# Patient Record
Sex: Female | Born: 1935 | Race: White | Hispanic: No | Smoking: Never smoker
Health system: Southern US, Community
[De-identification: ages and names within clinical notes are randomized; demographics above are authoritative.]

## PROBLEM LIST (undated history)

## (undated) DIAGNOSIS — G4730 Sleep apnea, unspecified: Secondary | ICD-10-CM

## (undated) DIAGNOSIS — I509 Heart failure, unspecified: Secondary | ICD-10-CM

## (undated) DIAGNOSIS — M109 Gout, unspecified: Secondary | ICD-10-CM

## (undated) DIAGNOSIS — Z8719 Personal history of other diseases of the digestive system: Secondary | ICD-10-CM

## (undated) DIAGNOSIS — I10 Essential (primary) hypertension: Secondary | ICD-10-CM

## (undated) DIAGNOSIS — K219 Gastro-esophageal reflux disease without esophagitis: Secondary | ICD-10-CM

## (undated) DIAGNOSIS — J45909 Unspecified asthma, uncomplicated: Secondary | ICD-10-CM

## (undated) DIAGNOSIS — I499 Cardiac arrhythmia, unspecified: Secondary | ICD-10-CM

## (undated) DIAGNOSIS — R519 Headache, unspecified: Secondary | ICD-10-CM

## (undated) DIAGNOSIS — M1990 Unspecified osteoarthritis, unspecified site: Secondary | ICD-10-CM

## (undated) DIAGNOSIS — IMO0001 Reserved for inherently not codable concepts without codable children: Secondary | ICD-10-CM

## (undated) DIAGNOSIS — R51 Headache: Secondary | ICD-10-CM

## (undated) DIAGNOSIS — E039 Hypothyroidism, unspecified: Secondary | ICD-10-CM

## (undated) DIAGNOSIS — R600 Localized edema: Secondary | ICD-10-CM

## (undated) HISTORY — PX: EYE SURGERY: SHX253

## (undated) HISTORY — PX: APPENDECTOMY: SHX54

## (undated) HISTORY — PX: CHOLECYSTECTOMY: SHX55

## (undated) HISTORY — DX: Heart failure, unspecified: I50.9

## (undated) HISTORY — PX: CARDIAC CATHETERIZATION: SHX172

## (undated) HISTORY — PX: JOINT REPLACEMENT: SHX530

## (undated) HISTORY — PX: BACK SURGERY: SHX140

---

## 2003-11-20 ENCOUNTER — Inpatient Hospital Stay (HOSPITAL_COMMUNITY): Admission: RE | Admit: 2003-11-20 | Discharge: 2003-11-22 | Payer: Self-pay | Admitting: Neurosurgery

## 2005-09-01 ENCOUNTER — Ambulatory Visit: Payer: Self-pay | Admitting: Internal Medicine

## 2006-09-06 ENCOUNTER — Ambulatory Visit: Payer: Self-pay | Admitting: Internal Medicine

## 2006-11-22 DIAGNOSIS — E782 Mixed hyperlipidemia: Secondary | ICD-10-CM | POA: Insufficient documentation

## 2007-01-01 ENCOUNTER — Ambulatory Visit: Payer: Self-pay | Admitting: Internal Medicine

## 2007-12-12 ENCOUNTER — Ambulatory Visit: Payer: Self-pay | Admitting: Internal Medicine

## 2008-04-21 ENCOUNTER — Ambulatory Visit: Payer: Self-pay | Admitting: General Practice

## 2008-04-21 ENCOUNTER — Other Ambulatory Visit: Payer: Self-pay

## 2008-04-29 ENCOUNTER — Inpatient Hospital Stay: Payer: Self-pay | Admitting: General Practice

## 2008-07-03 ENCOUNTER — Emergency Department: Payer: Self-pay | Admitting: Emergency Medicine

## 2008-12-16 ENCOUNTER — Ambulatory Visit: Payer: Self-pay | Admitting: Internal Medicine

## 2009-12-18 ENCOUNTER — Ambulatory Visit: Payer: Self-pay | Admitting: Internal Medicine

## 2010-01-25 ENCOUNTER — Ambulatory Visit: Payer: Self-pay | Admitting: Gastroenterology

## 2010-04-19 ENCOUNTER — Ambulatory Visit: Payer: Self-pay | Admitting: Rheumatology

## 2011-03-25 ENCOUNTER — Ambulatory Visit: Payer: Self-pay | Admitting: Internal Medicine

## 2012-04-11 ENCOUNTER — Ambulatory Visit: Payer: Self-pay | Admitting: Internal Medicine

## 2012-09-18 ENCOUNTER — Ambulatory Visit: Payer: Self-pay | Admitting: Internal Medicine

## 2012-11-07 ENCOUNTER — Ambulatory Visit: Payer: Self-pay | Admitting: Gastroenterology

## 2013-04-12 ENCOUNTER — Ambulatory Visit: Payer: Self-pay | Admitting: Internal Medicine

## 2014-04-16 ENCOUNTER — Ambulatory Visit: Payer: Self-pay | Admitting: Internal Medicine

## 2014-07-07 ENCOUNTER — Ambulatory Visit: Payer: Self-pay | Admitting: Ophthalmology

## 2014-07-07 DIAGNOSIS — I10 Essential (primary) hypertension: Secondary | ICD-10-CM

## 2014-07-07 LAB — POTASSIUM: Potassium: 3.9 mmol/L (ref 3.5–5.1)

## 2014-07-09 ENCOUNTER — Ambulatory Visit: Payer: Self-pay | Admitting: Ophthalmology

## 2015-02-14 NOTE — Op Note (Signed)
PATIENT NAME:  Andrea Phillips, Andrea Phillips MR#:  [id number] DATE OF BIRTH:  1935/02/28  DATE OF PROCEDURE:  07/09/2014  ATTENDING SURGEON:  Shanda Bumps D. Duke Salvia, MD  PREOPERATIVE DIAGNOSIS:  Macular hole, left eye.  POSTOPERATIVE DIAGNOSIS:  Macular hole, left eye.   ESTIMATED BLOOD LOSS:  Minimal.   PROCEDURE PERFORMED:  Vitrectomy with internal limiting membrane peel, air-fluid exchange, 24% SF6, left eye.   PROCEDURE NOTE:  Ms. Arcos was seen in our clinic for a macular hole, which had worsened over the past month since our last visit with her. After the risks, benefits, alternatives, and complications were reviewed, the patient elected to proceed with vitrectomy surgery to try to repair the macular hole. The patient was greeted in the preoperative holding area on the day of surgery. The left eye was identified and marked. The patient was then brought into the operating room in supine position and placed under general anesthesia. The left eye was then prepped and draped in the usual sterile fashion. A 25-gauge vitrectomy was then performed. The 3 trocars were inserted. The infusion tip was checked for placement in the vitreous cavity before starting the infusion. A core vitrectomy was then performed with the assistance of triamcinolone, and PVD was induced. ICG was then applied to the macular surface and then aspirated off, and an ILM forceps was then used to peel the ILM proximally in a 2 disc diameter radius around the macular hole. The periphery was then inspected 360 degrees with scleral depression. There were no tears or breaks found. A complete air-fluid exchange was performed, and 24% SF6 was then exchanged 4 times to decrease volume. The trocars were then removed. Leaking trocars were sutured with 7-0 Vicryl. Subconjunctival cefuroxime and dexamethasone was injected as well as a peribulbar block for the patient's comfort. The patient's eye was then patched and shield with Neo-Poly-Dex ointment,  and the patient was brought out of general anesthesia and taken to the recovery room in stable condition. The patient will see me tomorrow for followup.      ____________________________ Princella Pellegrini. Duke Salvia, MD jdr:nb D: 07/09/2014 21:50:56 ET T: 07/09/2014 22:46:23 ET JOB#: [id number]  cc: Shanda Bumps D. Duke Salvia, MD, <Dictator> Arora Coakley D [facility] MD ELECTRONICALLY SIGNED 07/23/2014 11:08

## 2015-03-02 ENCOUNTER — Encounter
Admission: RE | Admit: 2015-03-02 | Discharge: 2015-03-02 | Disposition: A | Discharge status: Home / Self Care | Payer: Medicare Other | Source: Ambulatory Visit | Attending: Ophthalmology | Admitting: Ophthalmology

## 2015-03-02 DIAGNOSIS — Z01812 Encounter for preprocedural laboratory examination: Secondary | ICD-10-CM | POA: Diagnosis not present

## 2015-03-02 DIAGNOSIS — M7989 Other specified soft tissue disorders: Secondary | ICD-10-CM | POA: Diagnosis not present

## 2015-03-02 DIAGNOSIS — I10 Essential (primary) hypertension: Secondary | ICD-10-CM | POA: Diagnosis not present

## 2015-03-02 DIAGNOSIS — Z9049 Acquired absence of other specified parts of digestive tract: Secondary | ICD-10-CM | POA: Diagnosis not present

## 2015-03-02 DIAGNOSIS — J45909 Unspecified asthma, uncomplicated: Secondary | ICD-10-CM | POA: Insufficient documentation

## 2015-03-02 DIAGNOSIS — I499 Cardiac arrhythmia, unspecified: Secondary | ICD-10-CM | POA: Insufficient documentation

## 2015-03-02 LAB — POTASSIUM: Potassium: 4.2 mmol/L (ref 3.5–5.1)

## 2015-03-05 ENCOUNTER — Encounter: Payer: Self-pay | Admitting: *Deleted

## 2015-03-05 DIAGNOSIS — Z9049 Acquired absence of other specified parts of digestive tract: Secondary | ICD-10-CM | POA: Diagnosis not present

## 2015-03-05 DIAGNOSIS — E780 Pure hypercholesterolemia: Secondary | ICD-10-CM | POA: Diagnosis not present

## 2015-03-05 DIAGNOSIS — Z888 Allergy status to other drugs, medicaments and biological substances status: Secondary | ICD-10-CM | POA: Diagnosis not present

## 2015-03-05 DIAGNOSIS — Z96652 Presence of left artificial knee joint: Secondary | ICD-10-CM | POA: Diagnosis not present

## 2015-03-05 DIAGNOSIS — M109 Gout, unspecified: Secondary | ICD-10-CM | POA: Diagnosis not present

## 2015-03-05 DIAGNOSIS — M810 Age-related osteoporosis without current pathological fracture: Secondary | ICD-10-CM | POA: Diagnosis not present

## 2015-03-05 DIAGNOSIS — E079 Disorder of thyroid, unspecified: Secondary | ICD-10-CM | POA: Diagnosis not present

## 2015-03-05 DIAGNOSIS — M069 Rheumatoid arthritis, unspecified: Secondary | ICD-10-CM | POA: Diagnosis not present

## 2015-03-05 DIAGNOSIS — M7989 Other specified soft tissue disorders: Secondary | ICD-10-CM | POA: Diagnosis not present

## 2015-03-05 DIAGNOSIS — K449 Diaphragmatic hernia without obstruction or gangrene: Secondary | ICD-10-CM | POA: Diagnosis not present

## 2015-03-05 DIAGNOSIS — Z79899 Other long term (current) drug therapy: Secondary | ICD-10-CM | POA: Diagnosis not present

## 2015-03-05 DIAGNOSIS — J45909 Unspecified asthma, uncomplicated: Secondary | ICD-10-CM | POA: Diagnosis not present

## 2015-03-05 DIAGNOSIS — K219 Gastro-esophageal reflux disease without esophagitis: Secondary | ICD-10-CM | POA: Diagnosis not present

## 2015-03-05 DIAGNOSIS — I10 Essential (primary) hypertension: Secondary | ICD-10-CM | POA: Diagnosis not present

## 2015-03-05 DIAGNOSIS — Z886 Allergy status to analgesic agent status: Secondary | ICD-10-CM | POA: Diagnosis not present

## 2015-03-05 DIAGNOSIS — H2512 Age-related nuclear cataract, left eye: Secondary | ICD-10-CM | POA: Diagnosis not present

## 2015-03-09 ENCOUNTER — Encounter: Payer: Self-pay | Admitting: *Deleted

## 2015-03-09 ENCOUNTER — Ambulatory Visit: Payer: Medicare Other | Admitting: Anesthesiology

## 2015-03-09 ENCOUNTER — Encounter
Admission: RE | Disposition: A | Discharge status: Home / Self Care | Payer: Self-pay | Source: Ambulatory Visit | Attending: Ophthalmology

## 2015-03-09 ENCOUNTER — Ambulatory Visit
Admission: RE | Admit: 2015-03-09 | Discharge: 2015-03-09 | Disposition: A | Discharge status: Home / Self Care | Payer: Medicare Other | Source: Ambulatory Visit | Attending: Ophthalmology | Admitting: Ophthalmology

## 2015-03-09 DIAGNOSIS — J45909 Unspecified asthma, uncomplicated: Secondary | ICD-10-CM | POA: Insufficient documentation

## 2015-03-09 DIAGNOSIS — H2512 Age-related nuclear cataract, left eye: Secondary | ICD-10-CM | POA: Insufficient documentation

## 2015-03-09 DIAGNOSIS — M069 Rheumatoid arthritis, unspecified: Secondary | ICD-10-CM | POA: Insufficient documentation

## 2015-03-09 DIAGNOSIS — Z888 Allergy status to other drugs, medicaments and biological substances status: Secondary | ICD-10-CM | POA: Insufficient documentation

## 2015-03-09 DIAGNOSIS — Z79899 Other long term (current) drug therapy: Secondary | ICD-10-CM | POA: Insufficient documentation

## 2015-03-09 DIAGNOSIS — E780 Pure hypercholesterolemia: Secondary | ICD-10-CM | POA: Insufficient documentation

## 2015-03-09 DIAGNOSIS — M7989 Other specified soft tissue disorders: Secondary | ICD-10-CM | POA: Insufficient documentation

## 2015-03-09 DIAGNOSIS — M810 Age-related osteoporosis without current pathological fracture: Secondary | ICD-10-CM | POA: Insufficient documentation

## 2015-03-09 DIAGNOSIS — Z886 Allergy status to analgesic agent status: Secondary | ICD-10-CM | POA: Insufficient documentation

## 2015-03-09 DIAGNOSIS — I10 Essential (primary) hypertension: Secondary | ICD-10-CM | POA: Insufficient documentation

## 2015-03-09 DIAGNOSIS — K219 Gastro-esophageal reflux disease without esophagitis: Secondary | ICD-10-CM | POA: Insufficient documentation

## 2015-03-09 DIAGNOSIS — K449 Diaphragmatic hernia without obstruction or gangrene: Secondary | ICD-10-CM | POA: Insufficient documentation

## 2015-03-09 DIAGNOSIS — Z96652 Presence of left artificial knee joint: Secondary | ICD-10-CM | POA: Insufficient documentation

## 2015-03-09 DIAGNOSIS — M109 Gout, unspecified: Secondary | ICD-10-CM | POA: Insufficient documentation

## 2015-03-09 DIAGNOSIS — E079 Disorder of thyroid, unspecified: Secondary | ICD-10-CM | POA: Insufficient documentation

## 2015-03-09 DIAGNOSIS — Z9049 Acquired absence of other specified parts of digestive tract: Secondary | ICD-10-CM | POA: Insufficient documentation

## 2015-03-09 HISTORY — DX: Reserved for inherently not codable concepts without codable children: IMO0001

## 2015-03-09 HISTORY — DX: Sleep apnea, unspecified: G47.30

## 2015-03-09 HISTORY — PX: CATARACT EXTRACTION W/PHACO: SHX586

## 2015-03-09 HISTORY — DX: Hypothyroidism, unspecified: E03.9

## 2015-03-09 HISTORY — DX: Localized edema: R60.0

## 2015-03-09 HISTORY — DX: Cardiac arrhythmia, unspecified: I49.9

## 2015-03-09 HISTORY — DX: Unspecified asthma, uncomplicated: J45.909

## 2015-03-09 HISTORY — DX: Essential (primary) hypertension: I10

## 2015-03-09 HISTORY — DX: Headache: R51

## 2015-03-09 HISTORY — DX: Gout, unspecified: M10.9

## 2015-03-09 HISTORY — DX: Headache, unspecified: R51.9

## 2015-03-09 HISTORY — DX: Personal history of other diseases of the digestive system: Z87.19

## 2015-03-09 HISTORY — DX: Gastro-esophageal reflux disease without esophagitis: K21.9

## 2015-03-09 HISTORY — DX: Unspecified osteoarthritis, unspecified site: M19.90

## 2015-03-09 SURGERY — PHACOEMULSIFICATION, CATARACT, WITH IOL INSERTION
Anesthesia: Monitor Anesthesia Care | Laterality: Left

## 2015-03-09 MED ORDER — EPINEPHRINE HCL 1 MG/ML IJ SOLN
INTRAMUSCULAR | Status: AC
  Filled 2015-03-09: qty 1

## 2015-03-09 MED ORDER — MIDAZOLAM HCL 2 MG/2ML IJ SOLN
INTRAMUSCULAR | Status: DC | PRN
  Administered 2015-03-09: 0.5 mg via INTRAVENOUS

## 2015-03-09 MED ORDER — BUPIVACAINE HCL (PF) 0.75 % IJ SOLN
INTRAMUSCULAR | Status: DC | PRN
  Administered 2015-03-09: 5 mL via OPHTHALMIC

## 2015-03-09 MED ORDER — BSS IO SOLN
INTRAOCULAR | Status: DC | PRN
  Administered 2015-03-09: 200 mL

## 2015-03-09 MED ORDER — SODIUM CHLORIDE 0.9 % IV SOLN
INTRAVENOUS | Status: DC
  Administered 2015-03-09: 09:00:00 via INTRAVENOUS

## 2015-03-09 MED ORDER — CYCLOPENTOLATE HCL 2 % OP SOLN
1.0000 [drp] | OPHTHALMIC | Status: AC
  Administered 2015-03-09 (×4): 1 [drp] via OPHTHALMIC

## 2015-03-09 MED ORDER — CYCLOPENTOLATE HCL 2 % OP SOLN
OPHTHALMIC | Status: AC
  Administered 2015-03-09: 1 [drp] via OPHTHALMIC
  Filled 2015-03-09: qty 2

## 2015-03-09 MED ORDER — CEFUROXIME OPHTHALMIC INJECTION 1 MG/0.1 ML
INJECTION | OPHTHALMIC | Status: AC
  Filled 2015-03-09: qty 0.1

## 2015-03-09 MED ORDER — TETRACAINE HCL 0.5 % OP SOLN
OPHTHALMIC | Status: DC | PRN
  Administered 2015-03-09: 1 [drp] via OPHTHALMIC

## 2015-03-09 MED ORDER — CARBACHOL 0.01 % IO SOLN
INTRAOCULAR | Status: DC | PRN
  Administered 2015-03-09: 0.5 mL via INTRAOCULAR

## 2015-03-09 MED ORDER — ALFENTANIL 500 MCG/ML IJ INJ
INJECTION | INTRAMUSCULAR | Status: DC | PRN
  Administered 2015-03-09: 500 ug via INTRAVENOUS

## 2015-03-09 MED ORDER — BUPIVACAINE HCL (PF) 0.75 % IJ SOLN
INTRAMUSCULAR | Status: AC
  Filled 2015-03-09: qty 10

## 2015-03-09 MED ORDER — LIDOCAINE HCL (PF) 4 % IJ SOLN
INTRAMUSCULAR | Status: AC
  Filled 2015-03-09: qty 5

## 2015-03-09 MED ORDER — HYALURONIDASE HUMAN 150 UNIT/ML IJ SOLN
INTRAMUSCULAR | Status: AC
  Filled 2015-03-09: qty 1

## 2015-03-09 MED ORDER — CEFUROXIME OPHTHALMIC INJECTION 1 MG/0.1 ML
INJECTION | OPHTHALMIC | Status: DC | PRN
  Administered 2015-03-09: 0.1 mL via INTRACAMERAL

## 2015-03-09 MED ORDER — MOXIFLOXACIN HCL 0.5 % OP SOLN - NO CHARGE
OPHTHALMIC | Status: DC | PRN
  Administered 2015-03-09: 1 [drp] via OPHTHALMIC

## 2015-03-09 MED ORDER — MOXIFLOXACIN HCL 0.5 % OP SOLN
OPHTHALMIC | Status: AC
  Administered 2015-03-09: 1 [drp] via OPHTHALMIC
  Filled 2015-03-09: qty 3

## 2015-03-09 MED ORDER — NA CHONDROIT SULF-NA HYALURON 40-17 MG/ML IO SOLN
INTRAOCULAR | Status: AC
  Filled 2015-03-09: qty 1

## 2015-03-09 MED ORDER — TETRACAINE HCL 0.5 % OP SOLN
OPHTHALMIC | Status: AC
  Filled 2015-03-09: qty 2

## 2015-03-09 MED ORDER — MOXIFLOXACIN HCL 0.5 % OP SOLN
1.0000 [drp] | OPHTHALMIC | Status: AC
  Administered 2015-03-09 (×3): 1 [drp] via OPHTHALMIC

## 2015-03-09 MED ORDER — PHENYLEPHRINE HCL 10 % OP SOLN
OPHTHALMIC | Status: AC
  Administered 2015-03-09: 1 [drp] via OPHTHALMIC
  Filled 2015-03-09: qty 5

## 2015-03-09 MED ORDER — PHENYLEPHRINE HCL 10 % OP SOLN
1.0000 [drp] | OPHTHALMIC | Status: AC
  Administered 2015-03-09 (×4): 1 [drp] via OPHTHALMIC

## 2015-03-09 MED ORDER — NA CHONDROIT SULF-NA HYALURON 40-17 MG/ML IO SOLN
INTRAOCULAR | Status: DC | PRN
  Administered 2015-03-09: 1 mL via INTRAOCULAR

## 2015-03-09 SURGICAL SUPPLY — 26 items
ACTIVE FMS ×3 IMPLANT
CORD BIP STRL DISP 12FT (MISCELLANEOUS) ×3 IMPLANT
DRAPE XRAY CASSETTE 23X24 (DRAPES) ×3 IMPLANT
ERASER HMR WETFIELD 18G (MISCELLANEOUS) ×3 IMPLANT
GLOVE BIO SURGEON STRL SZ8 (GLOVE) ×3 IMPLANT
GLOVE SURG LX 6.5 MICRO (GLOVE) ×2
GLOVE SURG LX 8.0 MICRO (GLOVE) ×2
GLOVE SURG LX STRL 6.5 MICRO (GLOVE) ×1 IMPLANT
GLOVE SURG LX STRL 8.0 MICRO (GLOVE) ×1 IMPLANT
GOWN STRL REUS W/ TWL LRG LVL3 (GOWN DISPOSABLE) ×1 IMPLANT
GOWN STRL REUS W/ TWL XL LVL3 (GOWN DISPOSABLE) ×1 IMPLANT
GOWN STRL REUS W/TWL LRG LVL3 (GOWN DISPOSABLE) ×2
GOWN STRL REUS W/TWL XL LVL3 (GOWN DISPOSABLE) ×2
LENS IOL ACRYSERT 25.5 (Intraocular Lens) ×3 IMPLANT
PACK CATARACT (MISCELLANEOUS) ×3 IMPLANT
PACK CATARACT DINGLEDEIN LX (MISCELLANEOUS) ×3 IMPLANT
PACK EYE AFTER SURG (MISCELLANEOUS) ×3 IMPLANT
SHLD EYE VISITEC  UNIV (MISCELLANEOUS) ×3 IMPLANT
SN6CWS25.5 ×3 IMPLANT
SOL PREP PVP 2OZ (MISCELLANEOUS) ×3
SOLUTION PREP PVP 2OZ (MISCELLANEOUS) ×1 IMPLANT
SUT SILK 5-0 (SUTURE) ×3 IMPLANT
SYR 5ML LL (SYRINGE) ×3 IMPLANT
SYR TB 1ML 27GX1/2 LL (SYRINGE) ×3 IMPLANT
WATER STERILE IRR 1000ML POUR (IV SOLUTION) ×3 IMPLANT
WIPE NON LINTING 3.25X3.25 (MISCELLANEOUS) ×3 IMPLANT

## 2015-03-09 NOTE — Progress Notes (Signed)
No pain upon discharge

## 2015-03-09 NOTE — Anesthesia Preprocedure Evaluation (Signed)
Anesthesia Evaluation  Patient identified by MRN, date of birth, ID band Patient awake    Reviewed: Allergy & Precautions, H&P , NPO status , Patient's Chart, lab work & pertinent test results, reviewed documented beta blocker date and time   Airway Mallampati: II  TM Distance: >3 FB Neck ROM: full    Dental no notable dental hx.    Pulmonary neg pulmonary ROS, shortness of breath, asthma , sleep apnea ,  breath sounds clear to auscultation  Pulmonary exam normal       Cardiovascular Exercise Tolerance: Good hypertension, negative cardio ROS  Rhythm:regular Rate:Normal     Neuro/Psych  Headaches, negative neurological ROS  negative psych ROS   GI/Hepatic negative GI ROS, Neg liver ROS, hiatal hernia, GERD-  ,  Endo/Other  negative endocrine ROSHypothyroidism   Renal/GU negative Renal ROS  negative genitourinary   Musculoskeletal   Abdominal   Peds  Hematology negative hematology ROS (+)   Anesthesia Other Findings   Reproductive/Obstetrics negative OB ROS                             Anesthesia Physical Anesthesia Plan  ASA: III  Anesthesia Plan: MAC   Post-op Pain Management:    Induction:   Airway Management Planned:   Additional Equipment:   Intra-op Plan:   Post-operative Plan:   Informed Consent: I have reviewed the patients History and Physical, chart, labs and discussed the procedure including the risks, benefits and alternatives for the proposed anesthesia with the patient or authorized representative who has indicated his/her understanding and acceptance.   Dental Advisory Given  Plan Discussed with: CRNA  Anesthesia Plan Comments:         Anesthesia Quick Evaluation

## 2015-03-09 NOTE — Transfer of Care (Signed)
Immediate Anesthesia Transfer of Care Note  Patient: Andrea Phillips  Procedure(s) Performed: Procedure(s) with comments: CATARACT EXTRACTION PHACO AND INTRAOCULAR LENS PLACEMENT (IOC) (Left) - [place] 01:31 AP% 26.1 CDE 43.72  Patient Location: PACU  Anesthesia Type:MAC  Level of Consciousness: awake, alert  and oriented  Airway & Oxygen Therapy: Patient Spontanous Breathing  Post-op Assessment: Report given to RN and Post -op Vital signs reviewed and stable  Post vital signs: Reviewed and stable  Last Vitals:  Filed Vitals:   03/09/15 0905  BP: 163/57  Pulse: 70  Temp: 36.8 C  Resp: 16    Complications: No apparent anesthesia complications

## 2015-03-09 NOTE — Discharge Instructions (Addendum)
Eye Surgery Discharge Instructions  Expect mild scratchy sensation or mild soreness. DO NOT RUB YOUR EYE!  The day of surgery:  Minimal physical activity, but bed rest is not required  No reading, computer work, or close hand work  No bending, lifting, or straining.  May watch TV  For 24 hours:  No driving, legal decisions, or alcoholic beverages  Safety precautions  Eat anything you prefer: It is better to start with liquids, then soup then solid foods.  _____ Eye patch should be worn until postoperative exam tomorrow.  ____ Solar shield eyeglasses should be worn for comfort in the sunlight/patch while sleeping  Resume all regular medications including aspirin or Coumadin if these were discontinued prior to surgery. You may shower, bathe, shave, or wash your hair. Tylenol may be taken for mild discomfort.  Call your doctor if you experience significant pain, nausea, or vomiting, fever > 101 or other signs of infection. [phone] or [phone] Specific instructions:  Follow-up Information    Follow up with Sallee Lange, MD.   Specialty:  Ophthalmology   Why:  9:10   Contact information:   [address]   [place] [address] [phone]

## 2015-03-09 NOTE — H&P (Signed)
  History and physical was faxed and scanned in.   

## 2015-03-09 NOTE — Interval H&P Note (Signed)
History and Physical Interval Note:  03/09/2015 10:37 AM  Andrea Phillips  has presented today for surgery, with the diagnosis of CATARACT  The various methods of treatment have been discussed with the patient and family. After consideration of risks, benefits and other options for treatment, the patient has consented to  Procedure(s): CATARACT EXTRACTION PHACO AND INTRAOCULAR LENS PLACEMENT (IOC) (Left) as a surgical intervention .  The patient's history has been reviewed, patient examined, no change in status, stable for surgery.  I have reviewed the patient's chart and labs.  Questions were answered to the patient's satisfaction.     [name]

## 2015-03-09 NOTE — Progress Notes (Signed)
Report given to Octaviano Glow, RN by Nicholes Rough, RN

## 2015-03-09 NOTE — Op Note (Signed)
Date of Surgery: 03/09/2015 Date of Dictation: 03/09/2015 11:16 AM Pre-operative Diagnosis:Nuclear Sclerotic Cataract, Posterior Subcapsular Cataract, Cortical Cataract and Mature Cataract left Eye Post-operative Diagnosis: same Procedure performed: Extra-capsular Cataract Extraction (ECCE) with placement of a posterior chamber intraocular lens (IOL) left Eye IOL:  Implant Name Type Inv. Item Serial No. Manufacturer Lot No. LRB No. Used  LENS IOL ACRYSERT 25.5 - [id number] Intraocular Lens LENS IOL ACRYSERT 25.5 [id number] ALCON   Left 1   Anesthesia: 2% Lidocaine and 4% Marcaine in a 50/50 mixture with 10 unites/ml of Hylenex given as a peribulbar Anesthesiologist: Anesthesiologist: Yevette Edwards, MD CRNA: Darrol Jump, CRNA Complications: none Estimated Blood Loss: less than 1 ml  Description of procedure:  The patient was given anesthesia and sedation via intravenous access. The patient was then prepped and draped in the usual fashion. A 25-gauge needle was bent for initiating the capsulorhexis. A 5-0 silk suture was placed through the conjunctiva superior and inferiorly to serve as bridle sutures. Hemostasis was obtained at the superior limbus using an eraser cautery. A partial thickness groove was made at the anterior surgical limbus with a 64 Beaver blade and this was dissected anteriorly with an [employer]O. The anterior chamber was entered at 10 o'clock with a 1.0 mm paracentesis knife and through the lamellar dissection with a 2.6 mm Alcon keratome. DiscoVisc was injected to replace the aqueous and a continuous tear curvilinear capsulorhexis was performed using a bent 25-gauge needle.  Balance salt on a syringe was used to perform hydro-dissection and phacoemulsification was carried out using a divide and conquer technique. Procedure(s) with comments: CATARACT EXTRACTION PHACO AND INTRAOCULAR LENS PLACEMENT (IOC) (Left) - [place] 01:31 AP% 26.1 CDE 43.72. Irrigation/aspiration was  used to remove the residual cortex and the capsular bag was inflated with DiscoVisc. The intraocular lens was inserted into the capsular bag using a pre-loaded Acrysert Delivery System. Irrigation/aspiration was used to remove the residual DiscoVisc. The wound was inflated with balanced salt and checked for leaks. None were found. Miostat was injected via the paracentesis track and 0.1 ml of cefuroxime containing 1 mg of drug  was injected via the paracentesis track. The wound was checked for leaks again and none were found.   The bridal sutures were removed and two drops of Vigamox were placed on the eye. An eye shield was placed to protect the eye and the patient was discharged to the recovery area in good condition.   Arnol Mcgibbon MD

## 2015-03-09 NOTE — Anesthesia Postprocedure Evaluation (Signed)
  Anesthesia Post-op Note  Patient: Andrea Phillips  Procedure(s) Performed: Procedure(s) with comments: CATARACT EXTRACTION PHACO AND INTRAOCULAR LENS PLACEMENT (IOC) (Left) - [place] 01:31 AP% 26.1 CDE 43.72  Anesthesia type:MAC  Patient location: PACU  Post pain: Pain level controlled  Post assessment: Post-op Vital signs reviewed, Patient's Cardiovascular Status Stable, Respiratory Function Stable, Patent Airway and No signs of Nausea or vomiting  Post vital signs: Reviewed and stable  Last Vitals:  Filed Vitals:   03/09/15 1119  BP: 142/66  Pulse:   Temp: 36.2 C  Resp: 16    Level of consciousness: awake, alert  and patient cooperative  Complications: No apparent anesthesia complications

## 2015-03-10 ENCOUNTER — Encounter: Payer: Self-pay | Admitting: Ophthalmology

## 2015-06-02 ENCOUNTER — Other Ambulatory Visit: Payer: Self-pay | Admitting: Internal Medicine

## 2015-06-02 DIAGNOSIS — Z1231 Encounter for screening mammogram for malignant neoplasm of breast: Secondary | ICD-10-CM

## 2015-06-03 ENCOUNTER — Ambulatory Visit
Admission: RE | Admit: 2015-06-03 | Discharge: 2015-06-03 | Disposition: A | Discharge status: Home / Self Care | Payer: Medicare Other | Source: Ambulatory Visit | Attending: Internal Medicine | Admitting: Internal Medicine

## 2015-06-03 DIAGNOSIS — Z1231 Encounter for screening mammogram for malignant neoplasm of breast: Secondary | ICD-10-CM | POA: Insufficient documentation

## 2016-03-25 ENCOUNTER — Other Ambulatory Visit: Payer: Self-pay | Admitting: Internal Medicine

## 2016-03-25 DIAGNOSIS — Z1231 Encounter for screening mammogram for malignant neoplasm of breast: Secondary | ICD-10-CM

## 2016-06-03 ENCOUNTER — Ambulatory Visit
Admission: RE | Admit: 2016-06-03 | Discharge: 2016-06-03 | Disposition: A | Discharge status: Home / Self Care | Payer: Medicare Other | Source: Ambulatory Visit | Attending: Internal Medicine | Admitting: Internal Medicine

## 2016-06-03 ENCOUNTER — Ambulatory Visit: Payer: Medicare Other

## 2016-06-03 DIAGNOSIS — Z1231 Encounter for screening mammogram for malignant neoplasm of breast: Secondary | ICD-10-CM | POA: Insufficient documentation

## 2017-04-04 ENCOUNTER — Ambulatory Visit (INDEPENDENT_AMBULATORY_CARE_PROVIDER_SITE_OTHER): Payer: Medicare Other | Admitting: Vascular Surgery

## 2017-04-04 ENCOUNTER — Encounter (INDEPENDENT_AMBULATORY_CARE_PROVIDER_SITE_OTHER): Payer: Self-pay | Admitting: Vascular Surgery

## 2017-04-04 VITALS — BP 180/80 | HR 73 | Resp 16 | Ht 60.0 in | Wt 186.0 lb

## 2017-04-04 DIAGNOSIS — I83813 Varicose veins of bilateral lower extremities with pain: Secondary | ICD-10-CM

## 2017-04-04 DIAGNOSIS — I10 Essential (primary) hypertension: Secondary | ICD-10-CM | POA: Diagnosis not present

## 2017-04-04 NOTE — Progress Notes (Signed)
Subjective:    Patient ID: Andrea Phillips, female    DOB: [birth year], 82 y.o.   MRN: [id number] Chief Complaint  Patient presents with  . New Patient (Initial Visit)   Presents as a new patient referred by Dr. Judie Grieve for evaluation of painful varicose veins. Patient endorses a history of pain located on the front of her left shin about 2 weeks ago. Patient states an intermittent left calf burning and stinging sensation. Patient denies any claudication-like symptoms, rest pain or ulcerations lower extremity. Her discomfort is intermittent and present with and without activity. The patient does not wear compression stockings however she does engage in elevation of her lower extremity. The patient does have visible varicose veins. Patient denies any history of trauma or surgery to the left lower extremity. Patient denies any fever, nausea or vomiting.    Review of Systems  Constitutional: Negative.   HENT: Negative.   Eyes: Negative.   Respiratory: Negative.   Cardiovascular:       Left lower extremity pain  Gastrointestinal: Negative.   Endocrine: Negative.   Genitourinary: Negative.   Musculoskeletal: Negative.   Skin: Negative.   Allergic/Immunologic: Negative.   Neurological: Negative.   Hematological: Negative.   Psychiatric/Behavioral: Negative.       Objective:   Physical Exam  Constitutional: She is oriented to person, place, and time. She appears well-developed and well-nourished. No distress.  HENT:  Head: Normocephalic and atraumatic.  Eyes: Conjunctivae are normal. Pupils are equal, round, and reactive to light.  Neck: Normal range of motion.  Cardiovascular: Normal rate, regular rhythm, normal heart sounds and intact distal pulses.   Pulses:      Radial pulses are 2+ on the right side, and 2+ on the left side.       Dorsalis pedis pulses are 2+ on the right side, and 2+ on the left side.       Posterior tibial pulses are 2+ on the right side, and 2+ on  the left side.  Pulmonary/Chest: Effort normal.  Musculoskeletal: Normal range of motion. She exhibits no edema.  Neurological: She is alert and oriented to person, place, and time.  Skin: She is not diaphoretic.  Patient with <1cm scattered varicose veins noted to the left lower extremity. No stasis dermatitis noted. Skin is intact.   Psychiatric: She has a normal mood and affect. Her behavior is normal. Judgment and thought content normal.  Vitals reviewed.   BP (!) 180/80   Pulse 73   Resp 16   Ht 5' (1.524 m)   Wt 186 lb (84.4 kg)   BMI 36.33 kg/m   Past Medical History:  Diagnosis Date  . Arthritis    ra  . Asthma   . Dysrhythmia   . Edema extremities   . GERD (gastroesophageal reflux disease)   . Gout   . Headache   . History of hiatal hernia   . Hypertension   . Hypothyroidism   . Shortness of breath dyspnea   . Sleep apnea     Social History   Social History  . Marital status: Widowed    Spouse name: N/A  . Number of children: N/A  . Years of education: N/A   Occupational History  . Not on file.   Social History Main Topics  . Smoking status: Never Smoker  . Smokeless tobacco: Never Used  . Alcohol use No  . Drug use: No  . Sexual activity: Not on file   Other Topics  Concern  . Not on file   Social History Narrative  . No narrative on file    Past Surgical History:  Procedure Laterality Date  . APPENDECTOMY    . BACK SURGERY    . CARDIAC CATHETERIZATION    . CATARACT EXTRACTION W/PHACO Left 03/09/2015   Procedure: CATARACT EXTRACTION PHACO AND INTRAOCULAR LENS PLACEMENT (IOC);  Surgeon: Sallee Lange, MD;  Location: [facility] ORS;  Service: Ophthalmology;  Laterality: Left;  [place] 01:31 AP% 26.1 CDE 43.72  . CHOLECYSTECTOMY    . EYE SURGERY     retina  . JOINT REPLACEMENT     left knee    Family History  Problem Relation Age of Onset  . Breast cancer Cousin     Allergies  Allergen Reactions  . Ace Inhibitors Cough  . Amlodipine  Itching  . Aspirin   . Atorvastatin Other (See Comments)  . Other     Yellow dye 6  . Prednisone   . Valsartan Itching       Assessment & Plan:  Presents as a new patient referred by Dr. Judie Grieve for evaluation of painful varicose veins. Patient endorses a history of pain located on the front of her left shin about 2 weeks ago. Patient states an intermittent left calf burning and stinging sensation. Patient denies any claudication-like symptoms, rest pain or ulcerations lower extremity. Her discomfort is intermittent and present with and without activity. The patient does not wear compression stockings however she does engage in elevation of her lower extremity. The patient does have visible varicose veins. Patient denies any history of trauma or surgery to the left lower extremity. Patient denies any fever, nausea or vomiting.   1. Varicose veins of both lower extremities with pain - New The patient was encouraged to wear graduated compression stockings (20-30 mmHg) on a daily basis. The patient was instructed to begin wearing the stockings first thing in the morning and removing them in the evening. The patient was instructed specifically not to sleep in the stockings. Prescription given. In addition, behavioral modification including elevation during the day will be initiated. Anti-inflammatories for pain. Patient elected to try conservative therapy before moving forward with a left lower extremity ultrasound of her veins Patient with strong pedal pulses bilaterally.   - VAS [place] LOWER EXTREMITY VENOUS REFLUX; Future  2. Essential hypertension - stable Encouraged good control as its slows the progression of atherosclerotic disease  Current Outpatient Prescriptions on File Prior to Visit  Medication Sig Dispense Refill  . levothyroxine (SYNTHROID, LEVOTHROID) 125 MCG tablet Take 125 mcg by mouth daily before breakfast.    . losartan-hydrochlorothiazide (HYZAAR) 100-12.5 MG per  tablet Take 1 tablet by mouth daily.    Marland Kitchen amLODipine (NORVASC) 5 MG tablet Take 5 mg by mouth daily.     No current facility-administered medications on file prior to visit.     There are no Patient Instructions on file for this visit. No Follow-up on file.   Kristof Nadeem A Glada Wickstrom, PA-C

## 2017-05-22 ENCOUNTER — Other Ambulatory Visit: Payer: Self-pay | Admitting: Internal Medicine

## 2017-05-22 DIAGNOSIS — Z1231 Encounter for screening mammogram for malignant neoplasm of breast: Secondary | ICD-10-CM

## 2017-06-06 ENCOUNTER — Ambulatory Visit
Admission: RE | Admit: 2017-06-06 | Discharge: 2017-06-06 | Disposition: A | Discharge status: Home / Self Care | Payer: Medicare Other | Source: Ambulatory Visit | Attending: Internal Medicine | Admitting: Internal Medicine

## 2017-06-06 DIAGNOSIS — Z1231 Encounter for screening mammogram for malignant neoplasm of breast: Secondary | ICD-10-CM | POA: Diagnosis present

## 2017-12-19 ENCOUNTER — Other Ambulatory Visit: Payer: Self-pay | Admitting: Specialist

## 2017-12-19 DIAGNOSIS — R918 Other nonspecific abnormal finding of lung field: Secondary | ICD-10-CM

## 2017-12-19 DIAGNOSIS — R0602 Shortness of breath: Secondary | ICD-10-CM

## 2017-12-29 ENCOUNTER — Ambulatory Visit
Admission: RE | Admit: 2017-12-29 | Discharge: 2017-12-29 | Disposition: A | Discharge status: Home / Self Care | Payer: Medicare Other | Source: Ambulatory Visit | Attending: Specialist | Admitting: Specialist

## 2017-12-29 DIAGNOSIS — K449 Diaphragmatic hernia without obstruction or gangrene: Secondary | ICD-10-CM | POA: Diagnosis not present

## 2017-12-29 DIAGNOSIS — R0602 Shortness of breath: Secondary | ICD-10-CM | POA: Insufficient documentation

## 2017-12-29 DIAGNOSIS — R918 Other nonspecific abnormal finding of lung field: Secondary | ICD-10-CM | POA: Diagnosis present

## 2017-12-29 DIAGNOSIS — I700 Atherosclerosis of aorta: Secondary | ICD-10-CM | POA: Diagnosis not present

## 2017-12-29 MED ORDER — IOPAMIDOL (ISOVUE-300) INJECTION 61%
75.0000 mL | Freq: Once | INTRAVENOUS | Status: AC | PRN
  Administered 2017-12-29: 75 mL via INTRAVENOUS

## 2018-04-11 ENCOUNTER — Other Ambulatory Visit: Payer: Self-pay | Admitting: Specialist

## 2018-04-11 DIAGNOSIS — R918 Other nonspecific abnormal finding of lung field: Secondary | ICD-10-CM

## 2018-04-11 DIAGNOSIS — R0609 Other forms of dyspnea: Principal | ICD-10-CM

## 2018-06-15 ENCOUNTER — Other Ambulatory Visit: Payer: Self-pay | Admitting: Internal Medicine

## 2018-06-15 DIAGNOSIS — Z1231 Encounter for screening mammogram for malignant neoplasm of breast: Secondary | ICD-10-CM

## 2018-07-09 ENCOUNTER — Ambulatory Visit
Admission: RE | Admit: 2018-07-09 | Discharge: 2018-07-09 | Disposition: A | Discharge status: Home / Self Care | Payer: Medicare Other | Source: Ambulatory Visit | Attending: Internal Medicine | Admitting: Internal Medicine

## 2018-07-09 DIAGNOSIS — Z1231 Encounter for screening mammogram for malignant neoplasm of breast: Secondary | ICD-10-CM | POA: Insufficient documentation

## 2019-03-07 ENCOUNTER — Ambulatory Visit
Admission: RE | Admit: 2019-03-07 | Discharge: 2019-03-07 | Disposition: A | Discharge status: Home / Self Care | Payer: Medicare Other | Source: Ambulatory Visit | Attending: Specialist | Admitting: Specialist

## 2019-03-07 ENCOUNTER — Other Ambulatory Visit: Payer: Self-pay

## 2019-03-07 DIAGNOSIS — R918 Other nonspecific abnormal finding of lung field: Secondary | ICD-10-CM | POA: Insufficient documentation

## 2019-03-07 DIAGNOSIS — R0609 Other forms of dyspnea: Secondary | ICD-10-CM | POA: Insufficient documentation

## 2019-05-14 ENCOUNTER — Other Ambulatory Visit: Payer: Self-pay | Admitting: Family

## 2019-05-14 DIAGNOSIS — Z1231 Encounter for screening mammogram for malignant neoplasm of breast: Secondary | ICD-10-CM

## 2019-07-11 ENCOUNTER — Ambulatory Visit
Admission: RE | Admit: 2019-07-11 | Discharge: 2019-07-11 | Disposition: A | Discharge status: Home / Self Care | Payer: Medicare Other | Source: Ambulatory Visit | Attending: Family | Admitting: Family

## 2019-07-11 DIAGNOSIS — Z1231 Encounter for screening mammogram for malignant neoplasm of breast: Secondary | ICD-10-CM | POA: Insufficient documentation

## 2020-04-07 ENCOUNTER — Emergency Department: Payer: Medicare Other

## 2020-04-07 ENCOUNTER — Other Ambulatory Visit: Payer: Self-pay

## 2020-04-07 ENCOUNTER — Observation Stay
Admission: EM | Admit: 2020-04-07 | Discharge: 2020-04-09 | Disposition: A | Discharge status: Home / Self Care | Payer: Medicare Other | Attending: Internal Medicine | Admitting: Internal Medicine

## 2020-04-07 DIAGNOSIS — M2578 Osteophyte, vertebrae: Secondary | ICD-10-CM | POA: Diagnosis not present

## 2020-04-07 DIAGNOSIS — R0600 Dyspnea, unspecified: Secondary | ICD-10-CM | POA: Diagnosis not present

## 2020-04-07 DIAGNOSIS — I493 Ventricular premature depolarization: Secondary | ICD-10-CM | POA: Diagnosis not present

## 2020-04-07 DIAGNOSIS — K449 Diaphragmatic hernia without obstruction or gangrene: Secondary | ICD-10-CM | POA: Insufficient documentation

## 2020-04-07 DIAGNOSIS — M47816 Spondylosis without myelopathy or radiculopathy, lumbar region: Secondary | ICD-10-CM | POA: Insufficient documentation

## 2020-04-07 DIAGNOSIS — Z886 Allergy status to analgesic agent status: Secondary | ICD-10-CM | POA: Insufficient documentation

## 2020-04-07 DIAGNOSIS — J45909 Unspecified asthma, uncomplicated: Secondary | ICD-10-CM | POA: Insufficient documentation

## 2020-04-07 DIAGNOSIS — G4733 Obstructive sleep apnea (adult) (pediatric): Secondary | ICD-10-CM | POA: Diagnosis not present

## 2020-04-07 DIAGNOSIS — Z9049 Acquired absence of other specified parts of digestive tract: Secondary | ICD-10-CM | POA: Insufficient documentation

## 2020-04-07 DIAGNOSIS — M160 Bilateral primary osteoarthritis of hip: Secondary | ICD-10-CM | POA: Diagnosis not present

## 2020-04-07 DIAGNOSIS — M25551 Pain in right hip: Secondary | ICD-10-CM

## 2020-04-07 DIAGNOSIS — Y9389 Activity, other specified: Secondary | ICD-10-CM | POA: Diagnosis not present

## 2020-04-07 DIAGNOSIS — R7989 Other specified abnormal findings of blood chemistry: Secondary | ICD-10-CM

## 2020-04-07 DIAGNOSIS — R55 Syncope and collapse: Secondary | ICD-10-CM | POA: Diagnosis not present

## 2020-04-07 DIAGNOSIS — S20211A Contusion of right front wall of thorax, initial encounter: Secondary | ICD-10-CM | POA: Diagnosis not present

## 2020-04-07 DIAGNOSIS — Z79899 Other long term (current) drug therapy: Secondary | ICD-10-CM | POA: Insufficient documentation

## 2020-04-07 DIAGNOSIS — J984 Other disorders of lung: Secondary | ICD-10-CM | POA: Insufficient documentation

## 2020-04-07 DIAGNOSIS — E785 Hyperlipidemia, unspecified: Secondary | ICD-10-CM | POA: Insufficient documentation

## 2020-04-07 DIAGNOSIS — K219 Gastro-esophageal reflux disease without esophagitis: Secondary | ICD-10-CM | POA: Insufficient documentation

## 2020-04-07 DIAGNOSIS — Z7901 Long term (current) use of anticoagulants: Secondary | ICD-10-CM | POA: Insufficient documentation

## 2020-04-07 DIAGNOSIS — M069 Rheumatoid arthritis, unspecified: Secondary | ICD-10-CM | POA: Diagnosis not present

## 2020-04-07 DIAGNOSIS — I119 Hypertensive heart disease without heart failure: Secondary | ICD-10-CM | POA: Diagnosis not present

## 2020-04-07 DIAGNOSIS — W19XXXA Unspecified fall, initial encounter: Secondary | ICD-10-CM | POA: Insufficient documentation

## 2020-04-07 DIAGNOSIS — R778 Other specified abnormalities of plasma proteins: Secondary | ICD-10-CM | POA: Insufficient documentation

## 2020-04-07 DIAGNOSIS — I10 Essential (primary) hypertension: Secondary | ICD-10-CM | POA: Diagnosis present

## 2020-04-07 DIAGNOSIS — Z803 Family history of malignant neoplasm of breast: Secondary | ICD-10-CM | POA: Diagnosis not present

## 2020-04-07 DIAGNOSIS — I700 Atherosclerosis of aorta: Secondary | ICD-10-CM | POA: Insufficient documentation

## 2020-04-07 DIAGNOSIS — Z9842 Cataract extraction status, left eye: Secondary | ICD-10-CM | POA: Diagnosis not present

## 2020-04-07 DIAGNOSIS — E039 Hypothyroidism, unspecified: Secondary | ICD-10-CM | POA: Diagnosis not present

## 2020-04-07 DIAGNOSIS — Z888 Allergy status to other drugs, medicaments and biological substances status: Secondary | ICD-10-CM | POA: Insufficient documentation

## 2020-04-07 DIAGNOSIS — Z96652 Presence of left artificial knee joint: Secondary | ICD-10-CM | POA: Insufficient documentation

## 2020-04-07 DIAGNOSIS — Z961 Presence of intraocular lens: Secondary | ICD-10-CM | POA: Diagnosis not present

## 2020-04-07 DIAGNOSIS — Z20822 Contact with and (suspected) exposure to covid-19: Secondary | ICD-10-CM | POA: Insufficient documentation

## 2020-04-07 LAB — CBC WITH DIFFERENTIAL/PLATELET
Abs Immature Granulocytes: 0.03 10*3/uL (ref 0.00–0.07)
Basophils Absolute: 0.1 10*3/uL (ref 0.0–0.1)
Basophils Relative: 1 %
Eosinophils Absolute: 0.3 10*3/uL (ref 0.0–0.5)
Eosinophils Relative: 3 %
HCT: 43.6 % (ref 36.0–46.0)
Hemoglobin: 14.6 g/dL (ref 12.0–15.0)
Immature Granulocytes: 0 %
Lymphocytes Relative: 23 %
Lymphs Abs: 2 10*3/uL (ref 0.7–4.0)
MCH: 29.4 pg (ref 26.0–34.0)
MCHC: 33.5 g/dL (ref 30.0–36.0)
MCV: 87.7 fL (ref 80.0–100.0)
Monocytes Absolute: 0.7 10*3/uL (ref 0.1–1.0)
Monocytes Relative: 8 %
Neutro Abs: 5.5 10*3/uL (ref 1.7–7.7)
Neutrophils Relative %: 65 %
Platelets: 262 10*3/uL (ref 150–400)
RBC: 4.97 MIL/uL (ref 3.87–5.11)
RDW: 13.2 % (ref 11.5–15.5)
WBC: 8.6 10*3/uL (ref 4.0–10.5)
nRBC: 0 % (ref 0.0–0.2)

## 2020-04-07 LAB — COMPREHENSIVE METABOLIC PANEL
ALT: 13 U/L (ref 0–44)
AST: 23 U/L (ref 15–41)
Albumin: 3.7 g/dL (ref 3.5–5.0)
Alkaline Phosphatase: 58 U/L (ref 38–126)
Anion gap: 9 (ref 5–15)
BUN: 15 mg/dL (ref 8–23)
CO2: 29 mmol/L (ref 22–32)
Calcium: 9.9 mg/dL (ref 8.9–10.3)
Chloride: 106 mmol/L (ref 98–111)
Creatinine, Ser: 0.85 mg/dL (ref 0.44–1.00)
GFR calc Af Amer: >60 mL/min (ref >60)
GFR calc non Af Amer: >60 mL/min (ref >60)
Glucose, Bld: 128 mg/dL — ABNORMAL HIGH (ref 70–99)
Potassium: 3.9 mmol/L (ref 3.5–5.1)
Sodium: 144 mmol/L (ref 135–145)
Total Bilirubin: 0.9 mg/dL (ref 0.3–1.2)
Total Protein: 6.6 g/dL (ref 6.5–8.1)

## 2020-04-07 LAB — URINALYSIS, COMPLETE (UACMP) WITH MICROSCOPIC
Bilirubin Urine: NEGATIVE
Glucose, UA: NEGATIVE mg/dL
Hgb urine dipstick: NEGATIVE
Ketones, ur: NEGATIVE mg/dL
Leukocytes,Ua: NEGATIVE
Nitrite: NEGATIVE
Protein, ur: NEGATIVE mg/dL
Specific Gravity, Urine: 1.002 — ABNORMAL LOW (ref 1.005–1.030)
pH: 7 (ref 5.0–8.0)

## 2020-04-07 LAB — TSH: TSH: 0.396 u[IU]/mL (ref 0.350–4.500)

## 2020-04-07 LAB — TROPONIN I (HIGH SENSITIVITY)
Troponin I (High Sensitivity): 28 ng/L — ABNORMAL HIGH (ref <18)
Troponin I (High Sensitivity): 35 ng/L — ABNORMAL HIGH (ref <18)

## 2020-04-07 MED ORDER — SODIUM CHLORIDE 0.9 % IV SOLN: INTRAVENOUS | Status: DC

## 2020-04-07 MED ORDER — ONDANSETRON HCL 4 MG/2ML IJ SOLN: 4.0000 mg | Freq: Four times a day (QID) | INTRAMUSCULAR | Status: DC | PRN

## 2020-04-07 MED ORDER — ENOXAPARIN SODIUM 40 MG/0.4ML ~~LOC~~ SOLN: 40.0000 mg | SUBCUTANEOUS | Status: DC

## 2020-04-07 MED ORDER — OXYCODONE HCL 5 MG PO TABS
2.5000 mg | ORAL_TABLET | Freq: Once | ORAL | Status: AC
  Administered 2020-04-07: 2.5 mg via ORAL
  Filled 2020-04-07: qty 1

## 2020-04-07 MED ORDER — HYDROCODONE-ACETAMINOPHEN 5-325 MG PO TABS
1.0000 | ORAL_TABLET | ORAL | Status: DC | PRN
  Administered 2020-04-08: 1 via ORAL
  Filled 2020-04-07: qty 1

## 2020-04-07 MED ORDER — IOHEXOL 300 MG/ML  SOLN
75.0000 mL | Freq: Once | INTRAMUSCULAR | Status: AC | PRN
  Administered 2020-04-07: 75 mL via INTRAVENOUS
  Filled 2020-04-07: qty 75

## 2020-04-07 MED ORDER — ONDANSETRON HCL 4 MG PO TABS: 4.0000 mg | ORAL_TABLET | Freq: Four times a day (QID) | ORAL | Status: DC | PRN

## 2020-04-07 MED ORDER — SENNOSIDES-DOCUSATE SODIUM 8.6-50 MG PO TABS: 1.0000 | ORAL_TABLET | Freq: Every evening | ORAL | Status: DC | PRN

## 2020-04-07 MED ORDER — ACETAMINOPHEN 650 MG RE SUPP: 650.0000 mg | Freq: Four times a day (QID) | RECTAL | Status: DC | PRN

## 2020-04-07 MED ORDER — SODIUM CHLORIDE 0.9% FLUSH
3.0000 mL | Freq: Two times a day (BID) | INTRAVENOUS | Status: DC
  Administered 2020-04-08 – 2020-04-09 (×2): 3 mL via INTRAVENOUS

## 2020-04-07 MED ORDER — ACETAMINOPHEN 325 MG PO TABS
650.0000 mg | ORAL_TABLET | Freq: Four times a day (QID) | ORAL | Status: DC | PRN
  Administered 2020-04-08 – 2020-04-09 (×3): 650 mg via ORAL
  Filled 2020-04-07 (×3): qty 2

## 2020-04-07 NOTE — ED Provider Notes (Signed)
[facility] Emergency Department Provider Note ____________________________________________   First MD Initiated Contact with Patient 04/07/20 1842     (approximate)  I have reviewed the triage vital signs and the nursing notes.   HISTORY  Chief Complaint Fall and Hip Pain  HPI Andrea Phillips is a 85 y.o. female presents to the emergency department for treatment and evaluation after falling in [employer].  She is unsure what caused her fall.  She states that she also felt 2 days ago and is unsure what caused that fall as well.  She is complaining of right hip pain since her fall today.  She denies dizziness, headache, chest pain, palpitations, nausea, vomiting, diarrhea.  She states that she has been here at the hospital quite frequently over the past week or so because her son was in ICU.  She states that he passed away 2 days ago.  She has felt "normal" otherwise.  She denies any changes to her medications.     Past Medical History:  Diagnosis Date   Arthritis    ra   Asthma    Dysrhythmia    Edema extremities    GERD (gastroesophageal reflux disease)    Gout    Headache    History of hiatal hernia    Hypertension    Hypothyroidism    Shortness of breath dyspnea    Sleep apnea     Patient Active Problem List   Diagnosis Date Noted   Varicose veins of both lower extremities with pain 04/04/2017   Essential hypertension 04/04/2017    Past Surgical History:  Procedure Laterality Date   APPENDECTOMY     BACK SURGERY     CARDIAC CATHETERIZATION     CATARACT EXTRACTION W/PHACO Left 03/09/2015   Procedure: CATARACT EXTRACTION PHACO AND INTRAOCULAR LENS PLACEMENT ([place]);  Surgeon: Estill Cotta, MD;  Location: [facility] ORS;  Service: Ophthalmology;  Laterality: Left;  [place] 01:31 AP% 26.1 CDE 43.72   CHOLECYSTECTOMY     EYE SURGERY     retina   JOINT REPLACEMENT     left knee    Prior to Admission medications   Medication  Sig Start Date End Date Taking? Authorizing Provider  amLODipine (NORVASC) 5 MG tablet Take 5 mg by mouth daily.    [provider]  B Complex-Biotin-FA (VITAMIN B50 COMPLEX PO) Take by mouth daily.    [provider]  Cholecalciferol (VITAMIN D3) 1000 units CAPS once a day 11/07/14   [provider]  levothyroxine (SYNTHROID, LEVOTHROID) 125 MCG tablet Take 125 mcg by mouth daily before breakfast.    [provider]  losartan-hydrochlorothiazide (HYZAAR) 100-12.5 MG per tablet Take 1 tablet by mouth daily.    [provider]  vitamin E 400 UNIT capsule once a day 01/06/15   [provider]    Allergies Ace inhibitors, Amlodipine, Aspirin, Atorvastatin, Other, Prednisone, and Valsartan  Family History  Problem Relation Age of Onset   Breast cancer Cousin     Social History Social History   Tobacco Use   Smoking status: Never Smoker   Smokeless tobacco: Never Used  Substance Use Topics   Alcohol use: No   Drug use: No    Review of Systems  Constitutional: No fever/chills Eyes: No visual changes. ENT: No sore throat. Cardiovascular: Denies chest pain. Respiratory: Denies shortness of breath. Gastrointestinal: No abdominal pain.  No nausea, no vomiting.  No diarrhea.  No constipation. Genitourinary: Negative for dysuria. Musculoskeletal: Positive for right  hip pain and right side rib pain. Skin: Negative for rash. Neurological: Negative for headaches, focal weakness or numbness. ____________________________________________   PHYSICAL EXAM:  VITAL SIGNS: ED Triage Vitals  Enc Vitals Group     BP 04/07/20 1828 (!) 191/80     Pulse Rate 04/07/20 1828 81     Resp 04/07/20 1828 18     Temp 04/07/20 1828 99.2 F (37.3 C)     Temp Source 04/07/20 1828 Oral     SpO2 04/07/20 1828 95 %     Weight 04/07/20 1829 170 lb (77.1 kg)     Height 04/07/20 1829 5' (1.524 m)     Head Circumference --      Peak Flow --       Pain Score 04/07/20 1829 5     Pain Loc --      Pain Edu? --      Excl. in GC? --     Constitutional: Alert and oriented. Well appearing and in no acute distress. Eyes: Conjunctivae are normal. PERRL. EOMI. Head: Atraumatic. Nose: No congestion/rhinnorhea. Mouth/Throat: Mucous membranes are moist.  Oropharynx non-erythematous. Neck: No stridor.   Hematological/Lymphatic/Immunilogical: No cervical lymphadenopathy. Cardiovascular: Normal rate, regular rhythm. Grossly normal heart sounds.  Good peripheral circulation. Respiratory: Normal respiratory effort.  No retractions. Lungs CTAB. Gastrointestinal: Soft and nontender. No distention. No abdominal bruits. No CVA tenderness. Genitourinary:  Musculoskeletal: Focal tenderness over the posterior mid ribs on the right side.  Neurologic:  Normal speech and language. No gross focal neurologic deficits are appreciated. No gait instability. Skin:  Skin is warm, dry and intact. No rash noted. Psychiatric: Mood and affect are normal. Speech and behavior are normal.  ____________________________________________   LABS (all labs ordered are listed, but only abnormal results are displayed)  Labs Reviewed  COMPREHENSIVE METABOLIC PANEL - Abnormal; Notable for the following components:      Result Value   Glucose, Bld 128 (*)    All other components within normal limits  URINALYSIS, COMPLETE (UACMP) WITH MICROSCOPIC - Abnormal; Notable for the following components:   Color, Urine COLORLESS (*)    APPearance CLEAR (*)    Specific Gravity, Urine 1.002 (*)    Bacteria, UA RARE (*)    All other components within normal limits  TROPONIN I (HIGH SENSITIVITY) - Abnormal; Notable for the following components:   Troponin I (High Sensitivity) 28 (*)    All other components within normal limits  TROPONIN I (HIGH SENSITIVITY) - Abnormal; Notable for the following components:   Troponin I (High Sensitivity) 35 (*)    All other components within normal  limits  SARS CORONAVIRUS 2 (TAT 6-24 HRS)  CBC WITH DIFFERENTIAL/PLATELET  TSH   ____________________________________________  EKG  ED ECG REPORT I, [place] Rabenold, FNP-BC personally viewed and interpreted this ECG.   Date: 04/07/2020  EKG Time: 1949  Rate: 84  Rhythm: Sinus rhythm with frequent PVC  Axis: normal  Intervals:none  ST&T Change: no ST elevation  ____________________________________________  RADIOLOGY  ED MD interpretation:    Chest and right rib image shows a mild cortical step-off along the right posterior lateral fourth rib.  There is an additional patchy retrocardiac opacity.  I, Kem Boroughs, personally viewed and evaluated these images (plain radiographs) as part of my medical decision making, as well as reviewing the written report by the radiologist.  Official radiology report(s): DG Ribs Unilateral W/Chest Right  Result Date: 04/07/2020 CLINICAL DATA:  Fall EXAM: RIGHT RIBS AND CHEST - 3+  VIEW COMPARISON:  CT 03/07/2019 FINDINGS: Mild cortical step-off along the right posterolateral fourth rib, could reflect a minimally displaced fracture, correlate for point tenderness. No other visible or displaced rib fractures are identified. No visible pneumothorax or effusion. Degenerative changes are present in the imaged spine and shoulders. Features are fairly advanced at the right acromioclavicular and glenohumeral joints. Lung volumes are low with some streaky atelectatic changes in the lung bases including more bandlike opacity in the left lung periphery which could reflect further atelectasis or scarring. Some patchy retrocardiac opacity with branching lucencies suggestive of air bronchograms is noted. No convincing features of edema. No convincing features of edema. The aorta is calcified. The remaining cardiomediastinal contours are unremarkable. IMPRESSION: 1. Mild cortical step-off along the right posterolateral fourth rib, may reflect a minimally displaced  fracture, correlate for point tenderness. 2. No other visible or displaced rib fractures. No pneumothorax or effusion. 3. Basilar atelectatic changes. Additional patchy retrocardiac opacity could reflect further atelectasis or developing consolidation. Electronically Signed   By: Kreg Shropshire M.D.   On: 04/07/2020 19:35   CT Head Wo Contrast  Result Date: 04/07/2020 CLINICAL DATA:  Larey Seat in a retail stool or. EXAM: CT HEAD WITHOUT CONTRAST CT CERVICAL SPINE WITHOUT CONTRAST TECHNIQUE: Multidetector CT imaging of the head and cervical spine was performed following the standard protocol without intravenous contrast. Multiplanar CT image reconstructions of the cervical spine were also generated. COMPARISON:  None. FINDINGS: CT HEAD FINDINGS Brain: No abnormality affects the brainstem or cerebellum. Cerebral hemispheres do not show accelerated atrophy or evidence of old small or large vessel infarction. No mass, hemorrhage, hydrocephalus or extra-axial collection. Vascular: There is atherosclerotic calcification of the major vessels at the base of the brain. This includes extensive calcification of the right supraclinoid ICA and proximal middle cerebral artery. Skull: Normal Sinuses/Orbits: Clear/normal Other: None CT CERVICAL SPINE FINDINGS Alignment: No traumatic malalignment. Straightening of the normal cervical lordosis. Skull base and vertebrae: No fracture or primary bone lesion. Soft tissues and spinal canal: No traumatic soft tissue finding. Carotid atherosclerotic calcification. Disc levels: Ordinary osteoarthritis at the C1-2 articulation. Subcentimeter cyst in the dens. Chronic fusion of the facets on the right at C2-3. Degenerative spondylosis at C3-4, C4-5, C5-6 and C6-7 with endplate osteophytes and bilateral foraminal encroachment. Apparent solid bridging anterior osteophytes at C4-5. Upper chest: Negative Other: None IMPRESSION: Head CT: No acute or traumatic finding. Normal appearance of the brain  parenchyma. Atherosclerotic calcification of the major vessels at the base of the brain, including extensive calcification of the right supraclinoid ICA and proximal middle cerebral artery. Cervical spine CT: No acute or traumatic finding. Degenerative spondylosis with osteophytic foraminal encroachment as noted above. Electronically Signed   By: Paulina Fusi M.D.   On: 04/07/2020 19:56   CT Chest W Contrast  Result Date: 04/07/2020 CLINICAL DATA:  Suspected rib fracture, fall EXAM: CT CHEST WITH CONTRAST TECHNIQUE: Multidetector CT imaging of the chest was performed during intravenous contrast administration. CONTRAST:  83mL OMNIPAQUE IOHEXOL 300 MG/ML  SOLN COMPARISON:  04/07/2020, CT chest 03/07/2019 FINDINGS: Cardiovascular: Mild aortic atherosclerosis. No aneurysmal dilatation. Mild coronary vascular calcification. Borderline to mild cardiomegaly. No pericardial effusion. Mediastinum/Nodes: Midline trachea. No thyroid mass. Scattered mediastinal lymph nodes, the largest is seen in the precarinal region and measures 11 mm. Esophagus unremarkable Lungs/Pleura: Linear scarring in the left lower lobe. No acute consolidation, pleural effusion, or pneumothorax. Upper Abdomen: Calcified granuloma in the spleen. Possible hypodense lesions within the anterior spleen and within the  dome of the spleen near calcified granuloma, measuring up to 2.1 cm. Musculoskeletal: Thoracic alignment within normal limits. Sternum is intact. No definite acute displaced rib fracture. IMPRESSION: 1. No CT evidence for acute intrathoracic abnormality. 2. Scarring in the left lung base 3. Possible hypodense splenic lesions, not clearly visible on previous exams. When the patient is clinically stable and able to follow directions and hold their breath (preferably as an outpatient) further evaluation with dedicated abdominal MRI should be considered. Aortic Atherosclerosis (ICD10-I70.0). Electronically Signed   By: Jasmine Pang M.D.   On:  04/07/2020 23:17   CT Cervical Spine Wo Contrast  Result Date: 04/07/2020 CLINICAL DATA:  Larey Seat in a retail stool or. EXAM: CT HEAD WITHOUT CONTRAST CT CERVICAL SPINE WITHOUT CONTRAST TECHNIQUE: Multidetector CT imaging of the head and cervical spine was performed following the standard protocol without intravenous contrast. Multiplanar CT image reconstructions of the cervical spine were also generated. COMPARISON:  None. FINDINGS: CT HEAD FINDINGS Brain: No abnormality affects the brainstem or cerebellum. Cerebral hemispheres do not show accelerated atrophy or evidence of old small or large vessel infarction. No mass, hemorrhage, hydrocephalus or extra-axial collection. Vascular: There is atherosclerotic calcification of the major vessels at the base of the brain. This includes extensive calcification of the right supraclinoid ICA and proximal middle cerebral artery. Skull: Normal Sinuses/Orbits: Clear/normal Other: None CT CERVICAL SPINE FINDINGS Alignment: No traumatic malalignment. Straightening of the normal cervical lordosis. Skull base and vertebrae: No fracture or primary bone lesion. Soft tissues and spinal canal: No traumatic soft tissue finding. Carotid atherosclerotic calcification. Disc levels: Ordinary osteoarthritis at the C1-2 articulation. Subcentimeter cyst in the dens. Chronic fusion of the facets on the right at C2-3. Degenerative spondylosis at C3-4, C4-5, C5-6 and C6-7 with endplate osteophytes and bilateral foraminal encroachment. Apparent solid bridging anterior osteophytes at C4-5. Upper chest: Negative Other: None IMPRESSION: Head CT: No acute or traumatic finding. Normal appearance of the brain parenchyma. Atherosclerotic calcification of the major vessels at the base of the brain, including extensive calcification of the right supraclinoid ICA and proximal middle cerebral artery. Cervical spine CT: No acute or traumatic finding. Degenerative spondylosis with osteophytic foraminal  encroachment as noted above. Electronically Signed   By: Paulina Fusi M.D.   On: 04/07/2020 19:56   CT Hip Right Wo Contrast  Result Date: 04/07/2020 CLINICAL DATA:  Right-sided hip pain after fall EXAM: CT OF THE RIGHT HIP WITHOUT CONTRAST TECHNIQUE: Multidetector CT imaging of the right hip was performed according to the standard protocol. Multiplanar CT image reconstructions were also generated. COMPARISON:  Radiograph 04/07/2020 FINDINGS: Bones/Joint/Cartilage No acute displaced fracture or malalignment. The right pubic rami appear intact. The pubic symphysis does not appear widened. No widening of image portions of right SI joint. No significant hip effusion. Ligaments Suboptimally assessed by CT. Muscles and Tendons No intramuscular fluid collections.  No significant atrophy. Soft tissues Vascular calcifications in the right groin. Minimal edema within the subcutaneous fat of the lateral hip likely due to a mild contusion. IMPRESSION: No acute osseous abnormality. MRI follow-up may be obtained if clinical suspicion for fracture remains high. Electronically Signed   By: Jasmine Pang M.D.   On: 04/07/2020 23:04   DG HIP UNILAT WITH PELVIS 2-3 VIEWS RIGHT  Result Date: 04/07/2020 CLINICAL DATA:  Larey Seat, right hip injury EXAM: DG HIP (WITH OR WITHOUT PELVIS) 2-3V RIGHT COMPARISON:  None. FINDINGS: Frontal view of the pelvis as well as frontal and frogleg lateral views of the right hip  are obtained. There are no acute displaced fractures. The hips are well aligned. Left greater than right hip osteoarthritis. Severe lower lumbar spondylosis. The sacroiliac joints are normal. IMPRESSION: 1. No acute displaced fracture. If nondisplaced fracture remains a clinical concern, MRI could be considered. 2. Prominent lower lumbar spondylosis. 3. Bilateral hip osteoarthritis. Electronically Signed   By: Sharlet Salina M.D.   On: 04/07/2020 19:36     ____________________________________________   PROCEDURES  Procedure(s) performed (including Critical Care):  Procedures  ____________________________________________   INITIAL IMPRESSION / ASSESSMENT AND PLAN     85 year old female presenting to the emergency department after her second fall for the week.  She is not sure what is causing her to fall.  She states that 1 minute she is walking and the next minute she is picking herself up off the floor.  This happened today while at [employer] shopping.  Plan will be to get a CT of her head and cervical spine as well as images of her chest, rib, and right hip.  We will also get an EKG, labs, and urinalysis.  DIFFERENTIAL DIAGNOSIS  Intracranial pathology, syncope, electrolyte imbalance, acute cystitis, hip fracture  ED COURSE  Chest x-ray indicates that there is a rib fracture of the fourth rib on the posterior lateral aspect of the right side.  There is also concern for a potential new opacity on the right side.  No indication of right hip fracture.  CT of the head and cervical spine are negative for acute findings.  Initial troponin is slightly elevated at 28.  Labs including urinalysis are otherwise reassuring.  Plan will be to repeat the troponin and get a CT of the chest and hip for further clarification. Concern there is more than 1 rib fracture and a potentially developing pneumonia. Her temperature is 99.2 with an oxygen saturation of 94% on room air.  CT does not indicate rib fracture or infection. Second troponin is further elevated at 35. Plan will be to admit for further work up. Hospitalist service paged.  ____________________________________________   FINAL CLINICAL IMPRESSION(S) / ED DIAGNOSES  Final diagnoses:  Acute hip pain, right  Fall, initial encounter  Rib contusion, right, initial encounter  Elevated troponin     ED Discharge Orders    None       Walta C Farrugia was evaluated in Emergency  Department on 04/07/2020 for the symptoms described in the history of present illness. She was evaluated in the context of the global COVID-19 pandemic, which necessitated consideration that the patient might be at risk for infection with the SARS-CoV-2 virus that causes COVID-19. Institutional protocols and algorithms that pertain to the evaluation of patients at risk for COVID-19 are in a state of rapid change based on information released by regulatory bodies including the CDC and federal and state organizations. These policies and algorithms were followed during the patient's care in the ED.   Note:  This document was prepared using Dragon voice recognition software and may include unintentional dictation errors.   Chinita Pester, FNP 04/07/20 [place]    Shaune Pollack, MD 04/09/20 [phone]

## 2020-04-07 NOTE — ED Triage Notes (Addendum)
Pt arrives via POV from [employer] after falling and injuring right hip. Pt states she does not remember how she fell but reports she did not experience any dizziness before falling. No obvious deformities to right hip. Pt reports difficulty bearing weight since accident. A&Ox4 and in NAD.

## 2020-04-07 NOTE — H&P (Signed)
History and Physical    Andrea Phillips RCV:[id number] DOB: 06-11-1935 DOA: 04/07/2020  PCP: Perrin Maltese, MD   Patient coming from: Home I have personally briefly reviewed patient's old medical records in [place]  Chief Complaint: Fall with  HPI: Andrea Phillips is a 85 y.o. female with medical history significant for hypertension and hyperlipidemia and hypothyroidism, with recent past history some months of back of frequent PVCs evaluated by cardiologist, Dr. Nehemiah Massed and found to be benign who presents to the emergency room following a [place] [employer].  This was her second fall this past week.  She denies tripping, or losing balance.  States she just fell and is uncertain whether she had any preceding symptoms.  She had no loss of consciousness .  Denies preceding one-sided numbness tingling weakness headache or blurred vision, and denies chest pain, palpitations.  Has not recently had an acute illness.  Her only complaint is right hip pain where she fell.  Niece at the bedside gives parts of the history and states that patient lost her son just 2 days ago and has been under a lot of stress. ED Course: On arrival in the emergency room she was awake and alert x3.  Had a low-grade temperature of 99.2, BP 191/80, otherwise normal vitals.  Blood work unremarkable with normal WBC of 8.6, normal chemistries, urinalysis unremarkable.  She went on to have a troponin that was 28 followed by a second troponin of 35.  EKG showed occasional PVCs but no acute ST-T wave changes.  She had extensive imaging studies to include CT chest, CT head CT C-spine and CT hip which were for the most part unremarkable.  Her pain was relieved with a mild narcotic in the ER.  Hospitalist consulted for admission for monitoring and work-up of syncope.  Review of Systems: As per HPI otherwise 10 point review of systems negative.    Past Medical History:  Diagnosis Date  . Arthritis    ra  . Asthma     . Dysrhythmia   . Edema extremities   . GERD (gastroesophageal reflux disease)   . Gout   . Headache   . History of hiatal hernia   . Hypertension   . Hypothyroidism   . Shortness of breath dyspnea   . Sleep apnea     Past Surgical History:  Procedure Laterality Date  . APPENDECTOMY    . BACK SURGERY    . CARDIAC CATHETERIZATION    . CATARACT EXTRACTION W/PHACO Left 03/09/2015   Procedure: CATARACT EXTRACTION PHACO AND INTRAOCULAR LENS PLACEMENT (IOC);  Surgeon: Estill Cotta, MD;  Location: [facility] ORS;  Service: Ophthalmology;  Laterality: Left;  [place] 01:31 AP% 26.1 CDE 43.72  . CHOLECYSTECTOMY    . EYE SURGERY     retina  . JOINT REPLACEMENT     left knee     reports that she has never smoked. She has never used smokeless tobacco. She reports that she does not drink alcohol and does not use drugs.  Allergies  Allergen Reactions  . Ace Inhibitors Cough  . Amlodipine Itching  . Aspirin   . Atorvastatin Other (See Comments)  . Other     Yellow dye 6  . Prednisone   . Valsartan Itching    Family History  Problem Relation Age of Onset  . Breast cancer Cousin      Prior to Admission medications   Medication Sig Start Date End Date Taking? Authorizing Provider  amLODipine (NORVASC) 5 MG tablet Take 5 mg by mouth daily.    [provider]  B Complex-Biotin-FA (VITAMIN B50 COMPLEX PO) Take by mouth daily.    [provider]  Cholecalciferol (VITAMIN D3) 1000 units CAPS once a day 11/07/14   [provider]  levothyroxine (SYNTHROID, LEVOTHROID) 125 MCG tablet Take 125 mcg by mouth daily before breakfast.    [provider]  losartan-hydrochlorothiazide (HYZAAR) 100-12.5 MG per tablet Take 1 tablet by mouth daily.    [provider]  vitamin E 400 UNIT capsule once a day 01/06/15   [provider]    Physical Exam: Vitals:   04/07/20 1828 04/07/20 1829 04/07/20 2214  BP: (!) 191/80  (!) 187/92  Pulse: 81  74   Resp: 18  18  Temp: 99.2 F (37.3 C)    TempSrc: Oral    SpO2: 95%  94%  Weight:  77.1 kg   Height:  5' (1.524 m)      Vitals:   04/07/20 1828 04/07/20 1829 04/07/20 2214  BP: (!) 191/80  (!) 187/92  Pulse: 81  74  Resp: 18  18  Temp: 99.2 F (37.3 C)    TempSrc: Oral    SpO2: 95%  94%  Weight:  77.1 kg   Height:  5' (1.524 m)       Constitutional: Alert and oriented x 3 . Not in any apparent distress HEENT:      Head: Normocephalic and atraumatic.         Eyes: PERLA, EOMI, Conjunctivae are normal. Sclera is non-icteric.       Mouth/Throat: Mucous membranes are moist.       Neck: Supple with no signs of meningismus. Cardiovascular: Regular rate and rhythm. No murmurs, gallops, or rubs. 2+ symmetrical distal pulses are present . No JVD. No LE edema Respiratory: Respiratory effort normal .Lungs sounds clear bilaterally. No wheezes, crackles, or rhonchi.  Gastrointestinal: Soft, mild tenderness on right side of abdomen and right flank/rib cage from where she fell.  Abdomen non distended with positive bowel sounds. No rebound or guarding. Genitourinary: No CVA tenderness. Musculoskeletal:  Pain on range of motion right hip. No edema, cyanosis, or erythema of extremities. Neurologic: Normal speech and language. Face is symmetric. Moving all extremities. No gross focal neurologic deficits . Skin: Skin is warm, dry.  No rash or ulcers Psychiatric: Mood and affect are normal Speech and behavior are normal   Labs on Admission: I have personally reviewed following labs and imaging studies  CBC: Recent Labs  Lab 04/07/20 1957  WBC 8.6  NEUTROABS 5.5  HGB 14.6  HCT 43.6  MCV 87.7  PLT 262   Basic Metabolic Panel: Recent Labs  Lab 04/07/20 1957  NA 144  K 3.9  CL 106  CO2 29  GLUCOSE 128*  BUN 15  CREATININE 0.85  CALCIUM 9.9   GFR: Estimated Creatinine Clearance: 46 mL/min (by C-G formula based on SCr of 0.85 mg/dL). Liver Function Tests: Recent Labs  Lab  04/07/20 1957  AST 23  ALT 13  ALKPHOS 58  BILITOT 0.9  PROT 6.6  ALBUMIN 3.7   No results for input(s): LIPASE, AMYLASE in the last 168 hours. No results for input(s): AMMONIA in the last 168 hours. Coagulation Profile: No results for input(s): INR, PROTIME in the last 168 hours. Cardiac Enzymes: No results for input(s): CKTOTAL, CKMB, CKMBINDEX, TROPONINI in the last 168 hours. BNP (last 3 results) No results for input(s): PROBNP  in the last 8760 hours. HbA1C: No results for input(s): HGBA1C in the last 72 hours. CBG: No results for input(s): GLUCAP in the last 168 hours. Lipid Profile: No results for input(s): CHOL, HDL, LDLCALC, TRIG, CHOLHDL, LDLDIRECT in the last 72 hours. Thyroid Function Tests: Recent Labs    04/07/20 1957  TSH 0.396   Anemia Panel: No results for input(s): VITAMINB12, FOLATE, FERRITIN, TIBC, IRON, RETICCTPCT in the last 72 hours. Urine analysis:    Component Value Date/Time   COLORURINE COLORLESS (A) 04/07/2020 1958   APPEARANCEUR CLEAR (A) 04/07/2020 1958   LABSPEC 1.002 (L) 04/07/2020 1958   PHURINE 7.0 04/07/2020 1958   GLUCOSEU NEGATIVE 04/07/2020 1958   HGBUR NEGATIVE 04/07/2020 1958   BILIRUBINUR NEGATIVE 04/07/2020 1958   KETONESUR NEGATIVE 04/07/2020 1958   PROTEINUR NEGATIVE 04/07/2020 1958   NITRITE NEGATIVE 04/07/2020 1958   LEUKOCYTESUR NEGATIVE 04/07/2020 1958    Radiological Exams on Admission: DG Ribs Unilateral W/Chest Right  Result Date: 04/07/2020 CLINICAL DATA:  Fall EXAM: RIGHT RIBS AND CHEST - 3+ VIEW COMPARISON:  CT 03/07/2019 FINDINGS: Mild cortical step-off along the right posterolateral fourth rib, could reflect a minimally displaced fracture, correlate for point tenderness. No other visible or displaced rib fractures are identified. No visible pneumothorax or effusion. Degenerative changes are present in the imaged spine and shoulders. Features are fairly advanced at the right acromioclavicular and glenohumeral  joints. Lung volumes are low with some streaky atelectatic changes in the lung bases including more bandlike opacity in the left lung periphery which could reflect further atelectasis or scarring. Some patchy retrocardiac opacity with branching lucencies suggestive of air bronchograms is noted. No convincing features of edema. No convincing features of edema. The aorta is calcified. The remaining cardiomediastinal contours are unremarkable. IMPRESSION: 1. Mild cortical step-off along the right posterolateral fourth rib, may reflect a minimally displaced fracture, correlate for point tenderness. 2. No other visible or displaced rib fractures. No pneumothorax or effusion. 3. Basilar atelectatic changes. Additional patchy retrocardiac opacity could reflect further atelectasis or developing consolidation. Electronically Signed   By: Kreg Shropshire M.D.   On: 04/07/2020 19:35   CT Head Wo Contrast  Result Date: 04/07/2020 CLINICAL DATA:  Larey Seat in a retail stool or. EXAM: CT HEAD WITHOUT CONTRAST CT CERVICAL SPINE WITHOUT CONTRAST TECHNIQUE: Multidetector CT imaging of the head and cervical spine was performed following the standard protocol without intravenous contrast. Multiplanar CT image reconstructions of the cervical spine were also generated. COMPARISON:  None. FINDINGS: CT HEAD FINDINGS Brain: No abnormality affects the brainstem or cerebellum. Cerebral hemispheres do not show accelerated atrophy or evidence of old small or large vessel infarction. No mass, hemorrhage, hydrocephalus or extra-axial collection. Vascular: There is atherosclerotic calcification of the major vessels at the base of the brain. This includes extensive calcification of the right supraclinoid ICA and proximal middle cerebral artery. Skull: Normal Sinuses/Orbits: Clear/normal Other: None CT CERVICAL SPINE FINDINGS Alignment: No traumatic malalignment. Straightening of the normal cervical lordosis. Skull base and vertebrae: No fracture or  primary bone lesion. Soft tissues and spinal canal: No traumatic soft tissue finding. Carotid atherosclerotic calcification. Disc levels: Ordinary osteoarthritis at the C1-2 articulation. Subcentimeter cyst in the dens. Chronic fusion of the facets on the right at C2-3. Degenerative spondylosis at C3-4, C4-5, C5-6 and C6-7 with endplate osteophytes and bilateral foraminal encroachment. Apparent solid bridging anterior osteophytes at C4-5. Upper chest: Negative Other: None IMPRESSION: Head CT: No acute or traumatic finding. Normal appearance of the brain parenchyma. Atherosclerotic  calcification of the major vessels at the base of the brain, including extensive calcification of the right supraclinoid ICA and proximal middle cerebral artery. Cervical spine CT: No acute or traumatic finding. Degenerative spondylosis with osteophytic foraminal encroachment as noted above. Electronically Signed   By: Paulina Fusi M.D.   On: 04/07/2020 19:56   CT Chest W Contrast  Result Date: 04/07/2020 CLINICAL DATA:  Suspected rib fracture, fall EXAM: CT CHEST WITH CONTRAST TECHNIQUE: Multidetector CT imaging of the chest was performed during intravenous contrast administration. CONTRAST:  51mL OMNIPAQUE IOHEXOL 300 MG/ML  SOLN COMPARISON:  04/07/2020, CT chest 03/07/2019 FINDINGS: Cardiovascular: Mild aortic atherosclerosis. No aneurysmal dilatation. Mild coronary vascular calcification. Borderline to mild cardiomegaly. No pericardial effusion. Mediastinum/Nodes: Midline trachea. No thyroid mass. Scattered mediastinal lymph nodes, the largest is seen in the precarinal region and measures 11 mm. Esophagus unremarkable Lungs/Pleura: Linear scarring in the left lower lobe. No acute consolidation, pleural effusion, or pneumothorax. Upper Abdomen: Calcified granuloma in the spleen. Possible hypodense lesions within the anterior spleen and within the dome of the spleen near calcified granuloma, measuring up to 2.1 cm. Musculoskeletal:  Thoracic alignment within normal limits. Sternum is intact. No definite acute displaced rib fracture. IMPRESSION: 1. No CT evidence for acute intrathoracic abnormality. 2. Scarring in the left lung base 3. Possible hypodense splenic lesions, not clearly visible on previous exams. When the patient is clinically stable and able to follow directions and hold their breath (preferably as an outpatient) further evaluation with dedicated abdominal MRI should be considered. Aortic Atherosclerosis (ICD10-I70.0). Electronically Signed   By: Jasmine Pang M.D.   On: 04/07/2020 23:17   CT Cervical Spine Wo Contrast  Result Date: 04/07/2020 CLINICAL DATA:  Larey Seat in a retail stool or. EXAM: CT HEAD WITHOUT CONTRAST CT CERVICAL SPINE WITHOUT CONTRAST TECHNIQUE: Multidetector CT imaging of the head and cervical spine was performed following the standard protocol without intravenous contrast. Multiplanar CT image reconstructions of the cervical spine were also generated. COMPARISON:  None. FINDINGS: CT HEAD FINDINGS Brain: No abnormality affects the brainstem or cerebellum. Cerebral hemispheres do not show accelerated atrophy or evidence of old small or large vessel infarction. No mass, hemorrhage, hydrocephalus or extra-axial collection. Vascular: There is atherosclerotic calcification of the major vessels at the base of the brain. This includes extensive calcification of the right supraclinoid ICA and proximal middle cerebral artery. Skull: Normal Sinuses/Orbits: Clear/normal Other: None CT CERVICAL SPINE FINDINGS Alignment: No traumatic malalignment. Straightening of the normal cervical lordosis. Skull base and vertebrae: No fracture or primary bone lesion. Soft tissues and spinal canal: No traumatic soft tissue finding. Carotid atherosclerotic calcification. Disc levels: Ordinary osteoarthritis at the C1-2 articulation. Subcentimeter cyst in the dens. Chronic fusion of the facets on the right at C2-3. Degenerative spondylosis  at C3-4, C4-5, C5-6 and C6-7 with endplate osteophytes and bilateral foraminal encroachment. Apparent solid bridging anterior osteophytes at C4-5. Upper chest: Negative Other: None IMPRESSION: Head CT: No acute or traumatic finding. Normal appearance of the brain parenchyma. Atherosclerotic calcification of the major vessels at the base of the brain, including extensive calcification of the right supraclinoid ICA and proximal middle cerebral artery. Cervical spine CT: No acute or traumatic finding. Degenerative spondylosis with osteophytic foraminal encroachment as noted above. Electronically Signed   By: Paulina Fusi M.D.   On: 04/07/2020 19:56   CT Hip Right Wo Contrast  Result Date: 04/07/2020 CLINICAL DATA:  Right-sided hip pain after fall EXAM: CT OF THE RIGHT HIP WITHOUT CONTRAST TECHNIQUE: Multidetector  CT imaging of the right hip was performed according to the standard protocol. Multiplanar CT image reconstructions were also generated. COMPARISON:  Radiograph 04/07/2020 FINDINGS: Bones/Joint/Cartilage No acute displaced fracture or malalignment. The right pubic rami appear intact. The pubic symphysis does not appear widened. No widening of image portions of right SI joint. No significant hip effusion. Ligaments Suboptimally assessed by CT. Muscles and Tendons No intramuscular fluid collections.  No significant atrophy. Soft tissues Vascular calcifications in the right groin. Minimal edema within the subcutaneous fat of the lateral hip likely due to a mild contusion. IMPRESSION: No acute osseous abnormality. MRI follow-up may be obtained if clinical suspicion for fracture remains high. Electronically Signed   By: Jasmine Pang M.D.   On: 04/07/2020 23:04   DG HIP UNILAT WITH PELVIS 2-3 VIEWS RIGHT  Result Date: 04/07/2020 CLINICAL DATA:  Larey Seat, right hip injury EXAM: DG HIP (WITH OR WITHOUT PELVIS) 2-3V RIGHT COMPARISON:  None. FINDINGS: Frontal view of the pelvis as well as frontal and frogleg lateral  views of the right hip are obtained. There are no acute displaced fractures. The hips are well aligned. Left greater than right hip osteoarthritis. Severe lower lumbar spondylosis. The sacroiliac joints are normal. IMPRESSION: 1. No acute displaced fracture. If nondisplaced fracture remains a clinical concern, MRI could be considered. 2. Prominent lower lumbar spondylosis. 3. Bilateral hip osteoarthritis. Electronically Signed   By: Sharlet Salina M.D.   On: 04/07/2020 19:36    EKG: Independently reviewed.   Assessment/Plan Principal Problem:   Syncope and collapse -Patient presenting with 2 falls with preceding lightheadedness -Suspect cardiac, possible arrhythmia in view of recent outpatient evaluation for frequent PVCs -We will admit for monitoring -Monitor and correct electrolyte imbalances specifically potassium and magnesium, the latter pending -Continuous cardiac monitoring, echocardiogram, carotid Doppler -Cardiology consult    Frequent PVCs -Patient was evaluated by Dr. Gwen Pounds in January 2021.  PVCs found to be benign -Management as above and cardiology consult    Elevated troponin -Slight troponin elevation 28>>35.  No chest pain and no acute ST-T wave changes -Possibly related to demand -Continue to monitor.  Cardiology consult as above  Essential hypertension -Blood pressure elevated at 191/80 on admission -Resume home meds -Labetalol as needed    Right hip pain secondary to fall -Pain control    OSA (obstructive sleep apnea) -CPAP as desired    DVT prophylaxis: Lovenox  Code Status: full code  Family Communication:  none  Disposition Plan: Back to previous home environment Consults called: Cardiology Status: Observation    Andris Baumann MD [facility]     04/07/2020, 11:51 PM

## 2020-04-07 NOTE — ED Notes (Signed)
See triage note  Presents s/p fall  States she fell in [employer]  Landed on right hip   No shortening noted  Having increased pain with standing

## 2020-04-08 ENCOUNTER — Observation Stay
Admit: 2020-04-08 | Discharge: 2020-04-08 | Disposition: A | Discharge status: Home / Self Care | Payer: Medicare Other | Attending: Internal Medicine | Admitting: Internal Medicine

## 2020-04-08 ENCOUNTER — Observation Stay: Admit: 2020-04-08 | Payer: Medicare Other

## 2020-04-08 DIAGNOSIS — R778 Other specified abnormalities of plasma proteins: Secondary | ICD-10-CM

## 2020-04-08 DIAGNOSIS — R55 Syncope and collapse: Secondary | ICD-10-CM | POA: Diagnosis not present

## 2020-04-08 DIAGNOSIS — G4733 Obstructive sleep apnea (adult) (pediatric): Secondary | ICD-10-CM

## 2020-04-08 DIAGNOSIS — I10 Essential (primary) hypertension: Secondary | ICD-10-CM | POA: Diagnosis not present

## 2020-04-08 DIAGNOSIS — M25551 Pain in right hip: Secondary | ICD-10-CM

## 2020-04-08 DIAGNOSIS — I493 Ventricular premature depolarization: Secondary | ICD-10-CM | POA: Diagnosis not present

## 2020-04-08 LAB — SARS CORONAVIRUS 2 (TAT 6-24 HRS): SARS Coronavirus 2: NEGATIVE

## 2020-04-08 MED ORDER — CHLORTHALIDONE 25 MG PO TABS
25.0000 mg | ORAL_TABLET | Freq: Every day | ORAL | Status: DC
  Administered 2020-04-08 – 2020-04-09 (×2): 25 mg via ORAL
  Filled 2020-04-08 (×2): qty 1

## 2020-04-08 MED ORDER — CARVEDILOL 6.25 MG PO TABS
6.2500 mg | ORAL_TABLET | Freq: Two times a day (BID) | ORAL | Status: DC
  Administered 2020-04-08 – 2020-04-09 (×2): 6.25 mg via ORAL
  Filled 2020-04-08 (×2): qty 1

## 2020-04-08 MED ORDER — ISOSORBIDE MONONITRATE ER 30 MG PO TB24
30.0000 mg | ORAL_TABLET | Freq: Every day | ORAL | Status: DC
  Administered 2020-04-08 – 2020-04-09 (×2): 30 mg via ORAL
  Filled 2020-04-08 (×2): qty 1

## 2020-04-08 MED ORDER — METOPROLOL TARTRATE 25 MG PO TABS
25.0000 mg | ORAL_TABLET | Freq: Two times a day (BID) | ORAL | Status: DC
  Administered 2020-04-08: 25 mg via ORAL
  Filled 2020-04-08: qty 1

## 2020-04-08 MED ORDER — HYDRALAZINE HCL 10 MG PO TABS
10.0000 mg | ORAL_TABLET | Freq: Three times a day (TID) | ORAL | Status: DC
  Administered 2020-04-08 – 2020-04-09 (×2): 10 mg via ORAL
  Filled 2020-04-08 (×7): qty 1

## 2020-04-08 MED ORDER — HYDRALAZINE HCL 20 MG/ML IJ SOLN: 5.0000 mg | Freq: Four times a day (QID) | INTRAMUSCULAR | Status: DC | PRN

## 2020-04-08 NOTE — Consult Note (Addendum)
Cardiology Consultation Note    Patient ID: Andrea Phillips, MRN: [id number], DOB/AGE: 11-27-1934 85 y.o. Admit date: 04/07/2020   Date of Consult: 04/08/2020 Primary Physician: Margaretann Loveless, MD Primary Cardiologist: Gwen Pounds  Chief Complaint: dizzy/syncoope Reason for Consultation: syncope Requesting MD: Dr. Hanley Ben  HPI: Andrea Phillips is a 85 y.o. female with history of hypertension, hyperlipidemia and hypothyroidism with a history of PVCs who presented to the emergency room well recovering from a fall.  Apparently this was the second fall in the past week.  Etiology of the falls are unclear.  She was evaluated as an outpatient for similar problems.  Echocardiogram in 2019 revealed normal LV function with no significant valvular abnormalities.  She is treated with labetalol 200 mg daily, daily, losartan hydrochlorothiazide 100-12.5 daily.  ER protocol high-sensitivity troponins were flat and unremarkable at 28 and 35.  TSH was normal.  Patient potassium was 3.9.  Creatinine is normal at 0.85.  She was normal.  She denies any chest pain or palpitations.  Is grieving the loss of her son recently.  Is afebrile with hypertensive on presentation.  EKG showed sinus rhythm with occasional PVCs.  Chest had CT spine and hips films were unremarkable.  Patient has had 2 episodes of falling.  Both of them were when she is up and moving.  She states she has no premonition.  She does not feel dizzy or lightheaded.  She is unaware of any tachyarrhythmias.  It is not any particular time of day.  She had an echo done per her report yesterday.  Will review when available.  She does not get orthostatic.  She did does not feel unsteady on her feet particularly. Past Medical History:  Diagnosis Date  . Arthritis    ra  . Asthma   . Dysrhythmia   . Edema extremities   . GERD (gastroesophageal reflux disease)   . Gout   . Headache   . History of hiatal hernia   . Hypertension   . Hypothyroidism   .  Shortness of breath dyspnea   . Sleep apnea       Surgical History:  Past Surgical History:  Procedure Laterality Date  . APPENDECTOMY    . BACK SURGERY    . CARDIAC CATHETERIZATION    . CATARACT EXTRACTION W/PHACO Left 03/09/2015   Procedure: CATARACT EXTRACTION PHACO AND INTRAOCULAR LENS PLACEMENT (IOC);  Surgeon: Sallee Lange, MD;  Location: [facility] ORS;  Service: Ophthalmology;  Laterality: Left;  [place] 01:31 AP% 26.1 CDE 43.72  . CHOLECYSTECTOMY    . EYE SURGERY     retina  . JOINT REPLACEMENT     left knee     Home Meds: Prior to Admission medications   Medication Sig Start Date End Date Taking? Authorizing Provider  acetaminophen (TYLENOL) 325 MG tablet Take by mouth every 6 (six) hours as needed for moderate pain.   Yes [provider]  albuterol (VENTOLIN HFA) 108 (90 Base) MCG/ACT inhaler Inhale 2 puffs into the lungs every 6 (six) hours as needed.   Yes [provider]  B Complex-Biotin-FA (VITAMIN B50 COMPLEX PO) Take by mouth daily.   Yes [provider]  Cholecalciferol (VITAMIN D3) 1000 units CAPS once a day 11/07/14  Yes [provider]  fluocinonide (LIDEX) 0.05 % external solution Apply 1 application topically daily as needed. 11/05/19  Yes [provider]  fluticasone (FLONASE) 50 MCG/ACT nasal spray Place 1 spray into both nostrils 2 (two) times daily  as needed. 09/22/15  Yes [provider]  hydrochlorothiazide (HYDRODIURIL) 12.5 MG tablet Take 12.5 mg by mouth daily. 11/14/19  Yes [provider]  icosapent Ethyl (VASCEPA) 1 g capsule Take 2 capsules by mouth 2 (two) times daily with a meal. 12/17/19  Yes [provider]  labetalol (NORMODYNE) 200 MG tablet Take 200 mg by mouth 2 (two) times daily. 11/18/19  Yes [provider]  levothyroxine (SYNTHROID, LEVOTHROID) 125 MCG tablet Take 125 mcg by mouth daily before breakfast.   Yes [provider]  Magnesium Oxide 250 MG TABS  Take 1-2 tablets by mouth daily as needed.   Yes [provider]  vitamin E 400 UNIT capsule once a day 01/06/15  Yes [provider]  amLODipine (NORVASC) 5 MG tablet Take 5 mg by mouth daily. Patient not taking: Reported on 04/08/2020    [provider]  losartan-hydrochlorothiazide (HYZAAR) 100-12.5 MG per tablet Take 1 tablet by mouth daily. Patient not taking: Reported on 04/08/2020    [provider]    Inpatient Medications:  . enoxaparin (LOVENOX) injection  40 mg Subcutaneous Q24H  . sodium chloride flush  3 mL Intravenous Q12H   . sodium chloride 75 mL/hr at 04/08/20 0456    Allergies:  Allergies  Allergen Reactions  . Other Hives    Yellow dye 6 FOOD COLOR YELLOW POWDER   . Ace Inhibitors Cough  . Amlodipine Itching  . Aspirin   . Atorvastatin Other (See Comments)  . Conj Estrog-Medroxyprogest Ace     PREMPRO 0.3-1.5 MG ORAL TABLET  . Prednisone   . Raloxifene     EVISTA 60 MG ORAL TABLET  . Valsartan Itching    Social History   Socioeconomic History  . Marital status: Widowed    Spouse name: Not on file  . Number of children: Not on file  . Years of education: Not on file  . Highest education level: Not on file  Occupational History  . Not on file  Tobacco Use  . Smoking status: Never Smoker  . Smokeless tobacco: Never Used  Substance and Sexual Activity  . Alcohol use: No  . Drug use: No  . Sexual activity: Not on file  Other Topics Concern  . Not on file  Social History Narrative  . Not on file   Social Determinants of Health   Financial Resource Strain:   . Difficulty of Paying Living Expenses:   Food Insecurity:   . Worried About Programme researcher, broadcasting/film/video in the Last Year:   . Barista in the Last Year:   Transportation Needs:   . Freight forwarder (Medical):   Marland Kitchen Lack of Transportation (Non-Medical):   Physical Activity:   . Days of Exercise per Week:   . Minutes of Exercise per Session:    Stress:   . Feeling of Stress :   Social Connections:   . Frequency of Communication with Friends and Family:   . Frequency of Social Gatherings with Friends and Family:   . Attends Religious Services:   . Active Member of Clubs or Organizations:   . Attends Banker Meetings:   Marland Kitchen Marital Status:   Intimate Partner Violence:   . Fear of Current or Ex-Partner:   . Emotionally Abused:   Marland Kitchen Physically Abused:   . Sexually Abused:      Family History  Problem Relation Age of Onset  . Breast cancer Cousin      Review  of Systems: A 12-system review of systems was performed and is negative except as noted in the HPI.  Labs: No results for input(s): CKTOTAL, CKMB, TROPONINI in the last 72 hours. Lab Results  Component Value Date   WBC 8.6 04/07/2020   HGB 14.6 04/07/2020   HCT 43.6 04/07/2020   MCV 87.7 04/07/2020   PLT 262 04/07/2020    Recent Labs  Lab 04/07/20 1957  NA 144  K 3.9  CL 106  CO2 29  BUN 15  CREATININE 0.85  CALCIUM 9.9  PROT 6.6  BILITOT 0.9  ALKPHOS 58  ALT 13  AST 23  GLUCOSE 128*   No results found for: CHOL, HDL, LDLCALC, TRIG No results found for: DDIMER  Radiology/Studies:  DG Ribs Unilateral W/Chest Right  Result Date: 04/07/2020 CLINICAL DATA:  Fall EXAM: RIGHT RIBS AND CHEST - 3+ VIEW COMPARISON:  CT 03/07/2019 FINDINGS: Mild cortical step-off along the right posterolateral fourth rib, could reflect a minimally displaced fracture, correlate for point tenderness. No other visible or displaced rib fractures are identified. No visible pneumothorax or effusion. Degenerative changes are present in the imaged spine and shoulders. Features are fairly advanced at the right acromioclavicular and glenohumeral joints. Lung volumes are low with some streaky atelectatic changes in the lung bases including more bandlike opacity in the left lung periphery which could reflect further atelectasis or scarring. Some patchy retrocardiac opacity with  branching lucencies suggestive of air bronchograms is noted. No convincing features of edema. No convincing features of edema. The aorta is calcified. The remaining cardiomediastinal contours are unremarkable. IMPRESSION: 1. Mild cortical step-off along the right posterolateral fourth rib, may reflect a minimally displaced fracture, correlate for point tenderness. 2. No other visible or displaced rib fractures. No pneumothorax or effusion. 3. Basilar atelectatic changes. Additional patchy retrocardiac opacity could reflect further atelectasis or developing consolidation. Electronically Signed   By: Lovena Le M.D.   On: 04/07/2020 19:35   CT Head Wo Contrast  Result Date: 04/07/2020 CLINICAL DATA:  [place] in a retail stool or. EXAM: CT HEAD WITHOUT CONTRAST CT CERVICAL SPINE WITHOUT CONTRAST TECHNIQUE: Multidetector CT imaging of the head and cervical spine was performed following the standard protocol without intravenous contrast. Multiplanar CT image reconstructions of the cervical spine were also generated. COMPARISON:  None. FINDINGS: CT HEAD FINDINGS Brain: No abnormality affects the brainstem or cerebellum. Cerebral hemispheres do not show accelerated atrophy or evidence of old small or large vessel infarction. No mass, hemorrhage, hydrocephalus or extra-axial collection. Vascular: There is atherosclerotic calcification of the major vessels at the base of the brain. This includes extensive calcification of the right supraclinoid ICA and proximal middle cerebral artery. Skull: Normal Sinuses/Orbits: Clear/normal Other: None CT CERVICAL SPINE FINDINGS Alignment: No traumatic malalignment. Straightening of the normal cervical lordosis. Skull base and vertebrae: No fracture or primary bone lesion. Soft tissues and spinal canal: No traumatic soft tissue finding. Carotid atherosclerotic calcification. Disc levels: Ordinary osteoarthritis at the C1-2 articulation. Subcentimeter cyst in the dens. Chronic fusion of  the facets on the right at C2-3. Degenerative spondylosis at C3-4, C4-5, C5-6 and C6-7 with endplate osteophytes and bilateral foraminal encroachment. Apparent solid bridging anterior osteophytes at C4-5. Upper chest: Negative Other: None IMPRESSION: Head CT: No acute or traumatic finding. Normal appearance of the brain parenchyma. Atherosclerotic calcification of the major vessels at the base of the brain, including extensive calcification of the right supraclinoid ICA and proximal middle cerebral artery. Cervical spine CT: No acute or traumatic  finding. Degenerative spondylosis with osteophytic foraminal encroachment as noted above. Electronically Signed   By: Paulina Fusi M.D.   On: 04/07/2020 19:56   CT Chest W Contrast  Result Date: 04/07/2020 CLINICAL DATA:  Suspected rib fracture, fall EXAM: CT CHEST WITH CONTRAST TECHNIQUE: Multidetector CT imaging of the chest was performed during intravenous contrast administration. CONTRAST:  104mL OMNIPAQUE IOHEXOL 300 MG/ML  SOLN COMPARISON:  04/07/2020, CT chest 03/07/2019 FINDINGS: Cardiovascular: Mild aortic atherosclerosis. No aneurysmal dilatation. Mild coronary vascular calcification. Borderline to mild cardiomegaly. No pericardial effusion. Mediastinum/Nodes: Midline trachea. No thyroid mass. Scattered mediastinal lymph nodes, the largest is seen in the precarinal region and measures 11 mm. Esophagus unremarkable Lungs/Pleura: Linear scarring in the left lower lobe. No acute consolidation, pleural effusion, or pneumothorax. Upper Abdomen: Calcified granuloma in the spleen. Possible hypodense lesions within the anterior spleen and within the dome of the spleen near calcified granuloma, measuring up to 2.1 cm. Musculoskeletal: Thoracic alignment within normal limits. Sternum is intact. No definite acute displaced rib fracture. IMPRESSION: 1. No CT evidence for acute intrathoracic abnormality. 2. Scarring in the left lung base 3. Possible hypodense splenic  lesions, not clearly visible on previous exams. When the patient is clinically stable and able to follow directions and hold their breath (preferably as an outpatient) further evaluation with dedicated abdominal MRI should be considered. Aortic Atherosclerosis (ICD10-I70.0). Electronically Signed   By: Jasmine Pang M.D.   On: 04/07/2020 23:17   CT Cervical Spine Wo Contrast  Result Date: 04/07/2020 CLINICAL DATA:  Larey Seat in a retail stool or. EXAM: CT HEAD WITHOUT CONTRAST CT CERVICAL SPINE WITHOUT CONTRAST TECHNIQUE: Multidetector CT imaging of the head and cervical spine was performed following the standard protocol without intravenous contrast. Multiplanar CT image reconstructions of the cervical spine were also generated. COMPARISON:  None. FINDINGS: CT HEAD FINDINGS Brain: No abnormality affects the brainstem or cerebellum. Cerebral hemispheres do not show accelerated atrophy or evidence of old small or large vessel infarction. No mass, hemorrhage, hydrocephalus or extra-axial collection. Vascular: There is atherosclerotic calcification of the major vessels at the base of the brain. This includes extensive calcification of the right supraclinoid ICA and proximal middle cerebral artery. Skull: Normal Sinuses/Orbits: Clear/normal Other: None CT CERVICAL SPINE FINDINGS Alignment: No traumatic malalignment. Straightening of the normal cervical lordosis. Skull base and vertebrae: No fracture or primary bone lesion. Soft tissues and spinal canal: No traumatic soft tissue finding. Carotid atherosclerotic calcification. Disc levels: Ordinary osteoarthritis at the C1-2 articulation. Subcentimeter cyst in the dens. Chronic fusion of the facets on the right at C2-3. Degenerative spondylosis at C3-4, C4-5, C5-6 and C6-7 with endplate osteophytes and bilateral foraminal encroachment. Apparent solid bridging anterior osteophytes at C4-5. Upper chest: Negative Other: None IMPRESSION: Head CT: No acute or traumatic finding.  Normal appearance of the brain parenchyma. Atherosclerotic calcification of the major vessels at the base of the brain, including extensive calcification of the right supraclinoid ICA and proximal middle cerebral artery. Cervical spine CT: No acute or traumatic finding. Degenerative spondylosis with osteophytic foraminal encroachment as noted above. Electronically Signed   By: Paulina Fusi M.D.   On: 04/07/2020 19:56   CT Hip Right Wo Contrast  Result Date: 04/07/2020 CLINICAL DATA:  Right-sided hip pain after fall EXAM: CT OF THE RIGHT HIP WITHOUT CONTRAST TECHNIQUE: Multidetector CT imaging of the right hip was performed according to the standard protocol. Multiplanar CT image reconstructions were also generated. COMPARISON:  Radiograph 04/07/2020 FINDINGS: Bones/Joint/Cartilage No acute displaced fracture or  malalignment. The right pubic rami appear intact. The pubic symphysis does not appear widened. No widening of image portions of right SI joint. No significant hip effusion. Ligaments Suboptimally assessed by CT. Muscles and Tendons No intramuscular fluid collections.  No significant atrophy. Soft tissues Vascular calcifications in the right groin. Minimal edema within the subcutaneous fat of the lateral hip likely due to a mild contusion. IMPRESSION: No acute osseous abnormality. MRI follow-up may be obtained if clinical suspicion for fracture remains high. Electronically Signed   By: Jasmine Pang M.D.   On: 04/07/2020 23:04   DG HIP UNILAT WITH PELVIS 2-3 VIEWS RIGHT  Result Date: 04/07/2020 CLINICAL DATA:  Larey Seat, right hip injury EXAM: DG HIP (WITH OR WITHOUT PELVIS) 2-3V RIGHT COMPARISON:  None. FINDINGS: Frontal view of the pelvis as well as frontal and frogleg lateral views of the right hip are obtained. There are no acute displaced fractures. The hips are well aligned. Left greater than right hip osteoarthritis. Severe lower lumbar spondylosis. The sacroiliac joints are normal. IMPRESSION: 1. No  acute displaced fracture. If nondisplaced fracture remains a clinical concern, MRI could be considered. 2. Prominent lower lumbar spondylosis. 3. Bilateral hip osteoarthritis. Electronically Signed   By: Sharlet Salina M.D.   On: 04/07/2020 19:36    Wt Readings from Last 3 Encounters:  04/07/20 77.1 kg  04/04/17 84.4 kg  03/05/15 83.9 kg    EKG: Sinus rhythm with occasional PVC  Physical Exam:  Blood pressure (!) 187/92, pulse 74, temperature 99.2 F (37.3 C), temperature source Oral, resp. rate 18, height 5' (1.524 m), weight 77.1 kg, SpO2 94 %. Body mass index is 33.2 kg/m. General: Well developed, well nourished, in no acute distress. Head: Normocephalic, atraumatic, sclera non-icteric, no xanthomas, nares are without discharge.  Neck: Negative for carotid bruits. JVD not elevated. Lungs: Clear bilaterally to auscultation without wheezes, rales, or rhonchi. Breathing is unlabored. Heart: RRR with S1 S2. No murmurs, rubs, or gallops appreciated. Abdomen: Soft, non-tender, non-distended with normoactive bowel sounds. No hepatomegaly. No rebound/guarding. No obvious abdominal masses. Msk:  Strength and tone appear normal for age. Extremities: No clubbing or cyanosis. No edema.  Distal pedal pulses are 2+ and equal bilaterally. Neuro: Alert and oriented X 3. No facial asymmetry. No focal deficit. Moves all extremities spontaneously. Psych:  Responds to questions appropriately with a normal affect.     Assessment and Plan  85 year old female who recently suffered the loss of her son who presents with 2 falls in the past week.  It is not clear that these are syncopal episodes.  She does not have dizziness prior to the event.  She has no premonition.  She feels that she is aware that she is falling and does not lose consciousness.  She has no orthostatic dizziness.  She had an echo 2 years ago which is unremarkable. .Echo today shows reduced lv funciton.   She denies orthopnea or PND.  She  denies rapid or irregular heartbeat.  Has been compliant with her medications.  She uses CPAP at night.  She states she does not feel sleepy prior to the falls.  EKG shows sinus rhythm with PVCs.  CT scans of the head neck brain are unremarkable.  There have been no arrhythmias thus far.  1.  Falling-etiology unclear.  Unclear whether it is syncope or not.Echo shows reduced lv function globally eith ef of 30-35% with no wall motion abnormalities. No significant valvular abnormalities.  Will change to carvedilol 6.25 mg bid  and attempt afterload reduction however, patient has had a race to arbs and ace in. Will add hydralazine and nitrates as pressure tolerates. . Gentle diuresis.   We will continue to adjust her blood pressure medications.  We will follow on telemetry for the next 24 hours.  Outpatient Holter monitor will be needed.  Does not appear to be ischemic as she has no exertional symptoms and cardiac markers are unremarkable.  2.  Hypertension-as listed above.  Will make adjustments in her medications and try to get her off labetalol so much.  Preparation to avoid any possible orthostasis.  Consider neurologic evaluation if work-up is unremarkable.  She has a lot of reported allergies to medications.  Apparently has itching to ARB's, ACE inhibitor's, amlodipine.  As per above.   3.  Sleep apnea-continue with CPAP.  Signed, Dalia Heading MD 04/08/2020, 7:02 AM Pager: [phone]

## 2020-04-08 NOTE — Progress Notes (Signed)
*  PRELIMINARY RESULTS* Echocardiogram 2D Echocardiogram has been performed.  Andrea Phillips 04/08/2020, 12:53 PM

## 2020-04-08 NOTE — Progress Notes (Signed)
Ch visited with Pt in response to OR for an AD. Pt was watching TV when Ch walked in. Ch asked if Pt was wanting to complete AD, and she said yes. Ch asked I she would like to go over it now, Pt said "let's do it tomorrow." Pt had a broad smile on her face, and a pleasant countenance. Ch commented on it, and asked what was going on with her. She reported to Ch that she had fallen, and they are trying to figure out what was going on. Ch asked Pt if her son was here at the hospital because Ch had written a bereavement card to her earlier in the evening. Pt told Ch that she had lost her son last Sunday here. "He is not in pain anymore. He had diabetes, CHF, everything" Ch expressed condolences. Pt and Ch spoke briefly about grief, and memorial service. Pt asked for prayer. Ch prayed with Pt. Pt was grateful. Ch let Pt know that she can contact RNs to get Chs anytime for support.

## 2020-04-08 NOTE — ED Notes (Signed)
Patient denies pain and is resting comfortably.  

## 2020-04-08 NOTE — Progress Notes (Signed)
PT Cancellation Note  Patient Details Name: CRYSTALLYNN NOORANI MRN: [id number] DOB: 30-Jul-1935   Cancelled Treatment:    Reason Eval/Treat Not Completed: Patient declined, no reason specified;Other (comment) (Patient just received food, requested PT return in 30 minutes or longer. Will attempt again at later time/date as available.)  Precious Bard, PT, DPT   04/08/2020, 10:10 AM

## 2020-04-08 NOTE — Progress Notes (Signed)
Patient ID: Andrea Phillips, female   DOB: 1935/09/23, 85 y.o.   MRN: [id number]  PROGRESS NOTE    Andrea Phillips  XTK:[id number] DOB: 10/03/1935 DOA: 04/07/2020 PCP: Margaretann Loveless, MD   Brief Narrative:  85 year old female with history of hypertension, hyperlipidemia, hypothyroidism and history of PVCs presented with a fall/syncope followed by right hip pain.  On presentation, CT chest, CT head, CT C-spine and CT were for the most part unremarkable.  She was admitted for work-up of syncope.  Assessment & Plan:   Syncope: Questionable cause -Presented with 2 falls with preceding lightheadedness -CT chest, CT head, CT C-spine and CT were for the most part unremarkable. -Continue telemetry monitoring.  Cardiology evaluation appreciated.  Follow 2D echo.  Might need outpatient Holter monitoring. -Fall precautions.  PT eval.  History of frequent PVCs -Cardiology following.  Follow 2D echo  Mildly elevated troponin -Troponins did not trend upward significantly.  No chest pain or acute ST-T wave changes.  Cardiology following.  Follow 2D echo  Essential hypertension -Monitor blood pressure.  Continue chlorthalidone and metoprolol.  Right hip pain secondary to fall -CT hip was unremarkable. -Pain control.  Fall precautions.  PT eval  OSA -Continue CPAP   DVT prophylaxis: Lovenox Code Status: Full Family Communication: Niece at bedside Disposition Plan: Status is: Observation  Currently observation status but might need PT eval/2D echo.  Might need SNF placement because of hip pain pending PT eval.  Dispo: The patient is from: Home              Anticipated d/c is to: Might need SNF placement pending PT eval              Anticipated d/c date is: 1 day              Patient currently is not medically stable to d/c.   Consultants: Cardiology  Procedures: None  Antimicrobials: None   Subjective: Patient seen and examined at bedside.  Denies current chest pain,  nausea, vomiting, dizziness.  Complains of right hip pain.  Objective: Vitals:   04/07/20 1829 04/07/20 2214 04/08/20 0840 04/08/20 0848  BP:  (!) 187/92    Pulse:  74 69   Resp:  18 16   Temp:      TempSrc:      SpO2:  94% 96% 96%  Weight: 77.1 kg     Height: 5' (1.524 m)      No intake or output data in the 24 hours ending 04/08/20 1222 Filed Weights   04/07/20 1829  Weight: 77.1 kg    Examination:  General exam: Appears calm and comfortable.  Elderly female lying in bed. Respiratory system: Bilateral decreased breath sounds at bases Cardiovascular system: S1 & S2 heard, Rate controlled Gastrointestinal system: Abdomen is nondistended, soft and nontender. Normal bowel sounds heard. Extremities: No cyanosis, clubbing, edema  Central nervous system: Alert and oriented. No focal neurological deficits. Moving extremities Skin: No rashes, lesions or ulcers Psychiatry: Flat affect   Data Reviewed: I have personally reviewed following labs and imaging studies  CBC: Recent Labs  Lab 04/07/20 1957  WBC 8.6  NEUTROABS 5.5  HGB 14.6  HCT 43.6  MCV 87.7  PLT 262   Basic Metabolic Panel: Recent Labs  Lab 04/07/20 1957  NA 144  K 3.9  CL 106  CO2 29  GLUCOSE 128*  BUN 15  CREATININE 0.85  CALCIUM 9.9   GFR: Estimated Creatinine Clearance: 46 mL/min (by C-G  formula based on SCr of 0.85 mg/dL). Liver Function Tests: Recent Labs  Lab 04/07/20 1957  AST 23  ALT 13  ALKPHOS 58  BILITOT 0.9  PROT 6.6  ALBUMIN 3.7   No results for input(s): LIPASE, AMYLASE in the last 168 hours. No results for input(s): AMMONIA in the last 168 hours. Coagulation Profile: No results for input(s): INR, PROTIME in the last 168 hours. Cardiac Enzymes: No results for input(s): CKTOTAL, CKMB, CKMBINDEX, TROPONINI in the last 168 hours. BNP (last 3 results) No results for input(s): PROBNP in the last 8760 hours. HbA1C: No results for input(s): HGBA1C in the last 72  hours. CBG: No results for input(s): GLUCAP in the last 168 hours. Lipid Profile: No results for input(s): CHOL, HDL, LDLCALC, TRIG, CHOLHDL, LDLDIRECT in the last 72 hours. Thyroid Function Tests: Recent Labs    04/07/20 1957  TSH 0.396   Anemia Panel: No results for input(s): VITAMINB12, FOLATE, FERRITIN, TIBC, IRON, RETICCTPCT in the last 72 hours. Sepsis Labs: No results for input(s): PROCALCITON, LATICACIDVEN in the last 168 hours.  Recent Results (from the past 240 hour(s))  SARS CORONAVIRUS 2 (TAT 6-24 HRS) Nasopharyngeal Nasopharyngeal Swab     Status: None   Collection Time: 04/07/20 11:35 PM   Specimen: Nasopharyngeal Swab  Result Value Ref Range Status   SARS Coronavirus 2 NEGATIVE NEGATIVE Final    Comment: (NOTE) SARS-CoV-2 target nucleic acids are NOT DETECTED.  The SARS-CoV-2 RNA is generally detectable in upper and lower respiratory specimens during the acute phase of infection. Negative results do not preclude SARS-CoV-2 infection, do not rule out co-infections with other pathogens, and should not be used as the sole basis for treatment or other patient management decisions. Negative results must be combined with clinical observations, patient history, and epidemiological information. The expected result is Negative.  Fact Sheet for Patients: HairSlick.no  Fact Sheet for Healthcare Providers: quierodirigir.com  This test is not yet approved or cleared by the [place] FDA and  has been authorized for detection and/or diagnosis of SARS-CoV-2 by FDA under an Emergency Use Authorization (EUA). This EUA will remain  in effect (meaning this test can be used) for the duration of the COVID-19 declaration under Se ction 564(b)(1) of the Act, 21 U.S.C. section 360bbb-3(b)(1), unless the authorization is terminated or revoked sooner.  Performed at [facility], [address] [place],  [place] [address]          Radiology Studies: DG Ribs Unilateral W/Chest Right  Result Date: 04/07/2020 CLINICAL DATA:  Fall EXAM: RIGHT RIBS AND CHEST - 3+ VIEW COMPARISON:  CT 03/07/2019 FINDINGS: Mild cortical step-off along the right posterolateral fourth rib, could reflect a minimally displaced fracture, correlate for point tenderness. No other visible or displaced rib fractures are identified. No visible pneumothorax or effusion. Degenerative changes are present in the imaged spine and shoulders. Features are fairly advanced at the right acromioclavicular and glenohumeral joints. Lung volumes are low with some streaky atelectatic changes in the lung bases including more bandlike opacity in the left lung periphery which could reflect further atelectasis or scarring. Some patchy retrocardiac opacity with branching lucencies suggestive of air bronchograms is noted. No convincing features of edema. No convincing features of edema. The aorta is calcified. The remaining cardiomediastinal contours are unremarkable. IMPRESSION: 1. Mild cortical step-off along the right posterolateral fourth rib, may reflect a minimally displaced fracture, correlate for point tenderness. 2. No other visible or displaced rib fractures. No pneumothorax or effusion.  3. Basilar atelectatic changes. Additional patchy retrocardiac opacity could reflect further atelectasis or developing consolidation. Electronically Signed   By: Lovena Le M.D.   On: 04/07/2020 19:35   CT Head Wo Contrast  Result Date: 04/07/2020 CLINICAL DATA:  [place] in a retail stool or. EXAM: CT HEAD WITHOUT CONTRAST CT CERVICAL SPINE WITHOUT CONTRAST TECHNIQUE: Multidetector CT imaging of the head and cervical spine was performed following the standard protocol without intravenous contrast. Multiplanar CT image reconstructions of the cervical spine were also generated. COMPARISON:  None. FINDINGS: CT HEAD FINDINGS Brain: No abnormality affects the brainstem or  cerebellum. Cerebral hemispheres do not show accelerated atrophy or evidence of old Phillips or large vessel infarction. No mass, hemorrhage, hydrocephalus or extra-axial collection. Vascular: There is atherosclerotic calcification of the major vessels at the base of the brain. This includes extensive calcification of the right supraclinoid ICA and proximal middle cerebral artery. Skull: Normal Sinuses/Orbits: Clear/normal Other: None CT CERVICAL SPINE FINDINGS Alignment: No traumatic malalignment. Straightening of the normal cervical lordosis. Skull base and vertebrae: No fracture or primary bone lesion. Soft tissues and spinal canal: No traumatic soft tissue finding. Carotid atherosclerotic calcification. Disc levels: Ordinary osteoarthritis at the C1-2 articulation. Subcentimeter cyst in the dens. Chronic fusion of the facets on the right at C2-3. Degenerative spondylosis at C3-4, C4-5, C5-6 and C6-7 with endplate osteophytes and bilateral foraminal encroachment. Apparent solid bridging anterior osteophytes at C4-5. Upper chest: Negative Other: None IMPRESSION: Head CT: No acute or traumatic finding. Normal appearance of the brain parenchyma. Atherosclerotic calcification of the major vessels at the base of the brain, including extensive calcification of the right supraclinoid ICA and proximal middle cerebral artery. Cervical spine CT: No acute or traumatic finding. Degenerative spondylosis with osteophytic foraminal encroachment as noted above. Electronically Signed   By: Nelson Chimes M.D.   On: 04/07/2020 19:56   CT Chest W Contrast  Result Date: 04/07/2020 CLINICAL DATA:  Suspected rib fracture, fall EXAM: CT CHEST WITH CONTRAST TECHNIQUE: Multidetector CT imaging of the chest was performed during intravenous contrast administration. CONTRAST:  55mL OMNIPAQUE IOHEXOL 300 MG/ML  SOLN COMPARISON:  04/07/2020, CT chest 03/07/2019 FINDINGS: Cardiovascular: Mild aortic atherosclerosis. No aneurysmal dilatation.  Mild coronary vascular calcification. Borderline to mild cardiomegaly. No pericardial effusion. Mediastinum/Nodes: Midline trachea. No thyroid mass. Scattered mediastinal lymph nodes, the largest is seen in the precarinal region and measures 11 mm. Esophagus unremarkable Lungs/Pleura: Linear scarring in the left lower lobe. No acute consolidation, pleural effusion, or pneumothorax. Upper Abdomen: Calcified granuloma in the spleen. Possible hypodense lesions within the anterior spleen and within the dome of the spleen near calcified granuloma, measuring up to 2.1 cm. Musculoskeletal: Thoracic alignment within normal limits. Sternum is intact. No definite acute displaced rib fracture. IMPRESSION: 1. No CT evidence for acute intrathoracic abnormality. 2. Scarring in the left lung base 3. Possible hypodense splenic lesions, not clearly visible on previous exams. When the patient is clinically stable and able to follow directions and hold their breath (preferably as an outpatient) further evaluation with dedicated abdominal MRI should be considered. Aortic Atherosclerosis (ICD10-I70.0). Electronically Signed   By: Donavan Foil M.D.   On: 04/07/2020 23:17   CT Cervical Spine Wo Contrast  Result Date: 04/07/2020 CLINICAL DATA:  [place] in a retail stool or. EXAM: CT HEAD WITHOUT CONTRAST CT CERVICAL SPINE WITHOUT CONTRAST TECHNIQUE: Multidetector CT imaging of the head and cervical spine was performed following the standard protocol without intravenous contrast. Multiplanar CT image reconstructions of the cervical  spine were also generated. COMPARISON:  None. FINDINGS: CT HEAD FINDINGS Brain: No abnormality affects the brainstem or cerebellum. Cerebral hemispheres do not show accelerated atrophy or evidence of old Phillips or large vessel infarction. No mass, hemorrhage, hydrocephalus or extra-axial collection. Vascular: There is atherosclerotic calcification of the major vessels at the base of the brain. This includes  extensive calcification of the right supraclinoid ICA and proximal middle cerebral artery. Skull: Normal Sinuses/Orbits: Clear/normal Other: None CT CERVICAL SPINE FINDINGS Alignment: No traumatic malalignment. Straightening of the normal cervical lordosis. Skull base and vertebrae: No fracture or primary bone lesion. Soft tissues and spinal canal: No traumatic soft tissue finding. Carotid atherosclerotic calcification. Disc levels: Ordinary osteoarthritis at the C1-2 articulation. Subcentimeter cyst in the dens. Chronic fusion of the facets on the right at C2-3. Degenerative spondylosis at C3-4, C4-5, C5-6 and C6-7 with endplate osteophytes and bilateral foraminal encroachment. Apparent solid bridging anterior osteophytes at C4-5. Upper chest: Negative Other: None IMPRESSION: Head CT: No acute or traumatic finding. Normal appearance of the brain parenchyma. Atherosclerotic calcification of the major vessels at the base of the brain, including extensive calcification of the right supraclinoid ICA and proximal middle cerebral artery. Cervical spine CT: No acute or traumatic finding. Degenerative spondylosis with osteophytic foraminal encroachment as noted above. Electronically Signed   By: Paulina Fusi M.D.   On: 04/07/2020 19:56   CT Hip Right Wo Contrast  Result Date: 04/07/2020 CLINICAL DATA:  Right-sided hip pain after fall EXAM: CT OF THE RIGHT HIP WITHOUT CONTRAST TECHNIQUE: Multidetector CT imaging of the right hip was performed according to the standard protocol. Multiplanar CT image reconstructions were also generated. COMPARISON:  Radiograph 04/07/2020 FINDINGS: Bones/Joint/Cartilage No acute displaced fracture or malalignment. The right pubic rami appear intact. The pubic symphysis does not appear widened. No widening of image portions of right SI joint. No significant hip effusion. Ligaments Suboptimally assessed by CT. Muscles and Tendons No intramuscular fluid collections.  No significant atrophy.  Soft tissues Vascular calcifications in the right groin. Minimal edema within the subcutaneous fat of the lateral hip likely due to a mild contusion. IMPRESSION: No acute osseous abnormality. MRI follow-up may be obtained if clinical suspicion for fracture remains high. Electronically Signed   By: Jasmine Pang M.D.   On: 04/07/2020 23:04   DG HIP UNILAT WITH PELVIS 2-3 VIEWS RIGHT  Result Date: 04/07/2020 CLINICAL DATA:  Larey Seat, right hip injury EXAM: DG HIP (WITH OR WITHOUT PELVIS) 2-3V RIGHT COMPARISON:  None. FINDINGS: Frontal view of the pelvis as well as frontal and frogleg lateral views of the right hip are obtained. There are no acute displaced fractures. The hips are well aligned. Left greater than right hip osteoarthritis. Severe lower lumbar spondylosis. The sacroiliac joints are normal. IMPRESSION: 1. No acute displaced fracture. If nondisplaced fracture remains a clinical concern, MRI could be considered. 2. Prominent lower lumbar spondylosis. 3. Bilateral hip osteoarthritis. Electronically Signed   By: Sharlet Salina M.D.   On: 04/07/2020 19:36        Scheduled Meds: . chlorthalidone  25 mg Oral Daily  . enoxaparin (LOVENOX) injection  40 mg Subcutaneous Q24H  . metoprolol tartrate  25 mg Oral BID  . sodium chloride flush  3 mL Intravenous Q12H   Continuous Infusions:        Glade Lloyd, MD [facility] 04/08/2020, 12:22 PM

## 2020-04-09 DIAGNOSIS — I493 Ventricular premature depolarization: Secondary | ICD-10-CM | POA: Diagnosis not present

## 2020-04-09 DIAGNOSIS — R55 Syncope and collapse: Secondary | ICD-10-CM | POA: Diagnosis not present

## 2020-04-09 DIAGNOSIS — R778 Other specified abnormalities of plasma proteins: Secondary | ICD-10-CM | POA: Diagnosis not present

## 2020-04-09 DIAGNOSIS — I10 Essential (primary) hypertension: Secondary | ICD-10-CM | POA: Diagnosis not present

## 2020-04-09 LAB — COMPREHENSIVE METABOLIC PANEL
ALT: 13 U/L (ref 0–44)
AST: 17 U/L (ref 15–41)
Albumin: 3.3 g/dL — ABNORMAL LOW (ref 3.5–5.0)
Alkaline Phosphatase: 49 U/L (ref 38–126)
Anion gap: 6 (ref 5–15)
BUN: 17 mg/dL (ref 8–23)
CO2: 30 mmol/L (ref 22–32)
Calcium: 9.6 mg/dL (ref 8.9–10.3)
Chloride: 105 mmol/L (ref 98–111)
Creatinine, Ser: 0.72 mg/dL (ref 0.44–1.00)
GFR calc Af Amer: >60 mL/min (ref >60)
GFR calc non Af Amer: >60 mL/min (ref >60)
Glucose, Bld: 120 mg/dL — ABNORMAL HIGH (ref 70–99)
Potassium: 4.5 mmol/L (ref 3.5–5.1)
Sodium: 141 mmol/L (ref 135–145)
Total Bilirubin: 1.1 mg/dL (ref 0.3–1.2)
Total Protein: 6 g/dL — ABNORMAL LOW (ref 6.5–8.1)

## 2020-04-09 LAB — CBC WITH DIFFERENTIAL/PLATELET
Abs Immature Granulocytes: 0.01 10*3/uL (ref 0.00–0.07)
Basophils Absolute: 0 10*3/uL (ref 0.0–0.1)
Basophils Relative: 0 %
Eosinophils Absolute: 0.2 10*3/uL (ref 0.0–0.5)
Eosinophils Relative: 3 %
HCT: 42.1 % (ref 36.0–46.0)
Hemoglobin: 14.6 g/dL (ref 12.0–15.0)
Immature Granulocytes: 0 %
Lymphocytes Relative: 17 %
Lymphs Abs: 1.2 10*3/uL (ref 0.7–4.0)
MCH: 29.8 pg (ref 26.0–34.0)
MCHC: 34.7 g/dL (ref 30.0–36.0)
MCV: 85.9 fL (ref 80.0–100.0)
Monocytes Absolute: 0.5 10*3/uL (ref 0.1–1.0)
Monocytes Relative: 7 %
Neutro Abs: 5.2 10*3/uL (ref 1.7–7.7)
Neutrophils Relative %: 73 %
Platelets: 243 10*3/uL (ref 150–400)
RBC: 4.9 MIL/uL (ref 3.87–5.11)
RDW: 13.2 % (ref 11.5–15.5)
WBC: 7.2 10*3/uL (ref 4.0–10.5)
nRBC: 0 % (ref 0.0–0.2)

## 2020-04-09 LAB — MAGNESIUM: Magnesium: 2 mg/dL (ref 1.7–2.4)

## 2020-04-09 LAB — GLUCOSE, CAPILLARY: Glucose-Capillary: 123 mg/dL — ABNORMAL HIGH (ref 70–99)

## 2020-04-09 LAB — ECHOCARDIOGRAM COMPLETE
Height: 60 in
Weight: 2720 oz

## 2020-04-09 MED ORDER — HYDRALAZINE HCL 10 MG PO TABS: 10.0000 mg | ORAL_TABLET | Freq: Three times a day (TID) | ORAL | 0 refills | Status: DC

## 2020-04-09 MED ORDER — ISOSORBIDE MONONITRATE ER 30 MG PO TB24: 30.0000 mg | ORAL_TABLET | Freq: Every day | ORAL | 0 refills | Status: DC

## 2020-04-09 MED ORDER — HYDROCODONE-ACETAMINOPHEN 5-325 MG PO TABS: 1.0000 | ORAL_TABLET | Freq: Four times a day (QID) | ORAL | 0 refills | Status: DC | PRN

## 2020-04-09 MED ORDER — CARVEDILOL 6.25 MG PO TABS: 6.2500 mg | ORAL_TABLET | Freq: Two times a day (BID) | ORAL | 0 refills | Status: DC

## 2020-04-09 MED ORDER — CHLORTHALIDONE 25 MG PO TABS: 25.0000 mg | ORAL_TABLET | Freq: Every day | ORAL | 0 refills | Status: DC

## 2020-04-09 NOTE — Evaluation (Signed)
Physical Therapy Evaluation Patient Details Name: Andrea Phillips MRN: [id number] DOB: [birth year] Today's Date: 04/09/2020   History of Present Illness  Andrea Phillips is a 85 y.o. female with medical history significant for hypertension and hyperlipidemia and hypothyroidism, with recent past history some months of back of frequent PVCs evaluated by cardiologist, Dr. Gwen Pounds and found to be benign who presents to the emergency room following a fall while [place] [employer].  This was her second fall this past week.  Clinical Impression  Patient received in bed with niece present in room. Patient is very pleasant and motivated to get up walking so that she can go home. She performed bed mobility and transfer with mod independence, ambulated 300 feet without AD and min guard on room air. O2 sats remained > 92% throughout session. She does reports mild sob. Patient veers to left and right while ambulating, but had no lob. She will continue to benefit from skilled PT to work on balance and safety to decrease fall risk.        Follow Up Recommendations Outpatient PT    Equipment Recommendations  None recommended by PT    Recommendations for Other Services       Precautions / Restrictions Precautions Precautions: Fall Restrictions Weight Bearing Restrictions: No      Mobility  Bed Mobility Overal bed mobility: Modified Independent                Transfers Overall transfer level: Modified independent                  Ambulation/Gait Ambulation/Gait assistance: Min guard Gait Distance (Feet): 300 Feet Assistive device: None Gait Pattern/deviations: Step-through pattern;Drifts right/left Gait velocity: WNL   General Gait Details: slightly unsteady with ambulation, no LOB but reports not feeling quite right or as usual.  Stairs            Wheelchair Mobility    Modified Rankin (Stroke Patients Only)       Balance Overall balance assessment: Needs  assistance Sitting-balance support: Feet supported Sitting balance-Leahy Scale: Good     Standing balance support: No upper extremity supported;During functional activity Standing balance-Leahy Scale: Fair Standing balance comment: benefits from assistance/supervision at this time for safety. Strong family support                             Pertinent Vitals/Pain Pain Assessment: Faces Faces Pain Scale: Hurts a little bit Pain Location: R hip from fall Pain Descriptors / Indicators: Sore Pain Intervention(s): Monitored during session    Home Living Family/patient expects to be discharged to:: Private residence Living Arrangements: Alone Available Help at Discharge: Family;Available PRN/intermittently Type of Home: House Home Access: Ramped entrance     Home Layout: Able to live on main level with bedroom/bathroom;Two level Home Equipment: Walker - 2 wheels;Cane - single point      Prior Function Level of Independence: Independent;Independent with assistive device(s)         Comments: uses cane when outdoors     Hand Dominance        Extremity/Trunk Assessment   Upper Extremity Assessment Upper Extremity Assessment: Overall WFL for tasks assessed    Lower Extremity Assessment Lower Extremity Assessment: Overall WFL for tasks assessed    Cervical / Trunk Assessment Cervical / Trunk Assessment: Normal  Communication   Communication: No difficulties  Cognition Arousal/Alertness: Awake/alert Behavior During Therapy: WFL for tasks assessed/performed Overall Cognitive Status:  Within Functional Limits for tasks assessed                                        General Comments      Exercises     Assessment/Plan    PT Assessment Patient needs continued PT services  PT Problem List Decreased strength;Decreased mobility;Decreased balance;Decreased activity tolerance;Cardiopulmonary status limiting activity       PT Treatment  Interventions Therapeutic activities;Therapeutic exercise;Gait training;Functional mobility training;Balance training;Patient/family education    PT Goals (Current goals can be found in the Care Plan section)  Acute Rehab PT Goals Patient Stated Goal: to go home today PT Goal Formulation: With patient Time For Goal Achievement: 04/16/20 Potential to Achieve Goals: Good    Frequency Min 2X/week   Barriers to discharge Decreased caregiver support      Co-evaluation               AM-PAC PT "6 Clicks" Mobility  Outcome Measure Help needed turning from your back to your side while in a flat bed without using bedrails?: None Help needed moving from lying on your back to sitting on the side of a flat bed without using bedrails?: None Help needed moving to and from a bed to a chair (including a wheelchair)?: A Little Help needed standing up from a chair using your arms (e.g., wheelchair or bedside chair)?: A Little Help needed to walk in hospital room?: A Little Help needed climbing 3-5 steps with a railing? : A Little 6 Click Score: 20    End of Session Equipment Utilized During Treatment: Gait belt Activity Tolerance: Patient tolerated treatment well Patient left: in bed;with bed alarm set;with call bell/phone within reach;with family/visitor present Nurse Communication: Mobility status PT Visit Diagnosis: Unsteadiness on feet (R26.81);Repeated falls (R29.6)    Time: 5638-7564 PT Time Calculation (min) (ACUTE ONLY): 23 min   Charges:   PT Evaluation $PT Eval Moderate Complexity: 1 Mod PT Treatments $Gait Training: 8-22 mins        Aaron Bostwick, PT, GCS 04/09/20,10:40 AM

## 2020-04-09 NOTE — Plan of Care (Signed)
Pt has completed or met all care plan associated goals related to this admission adequately enough for discharge

## 2020-04-09 NOTE — Care Management Obs Status (Signed)
MEDICARE OBSERVATION STATUS NOTIFICATION   Patient Details  Name: Andrea Phillips MRN: [id number] Date of Birth: 1935-05-17   Medicare Observation Status Notification Given:  Yes    Araceli Arango A Saliou Barnier, LCSW 04/09/2020, 10:42 AM

## 2020-04-09 NOTE — Discharge Summary (Signed)
Physician Discharge Summary  DLYNN RANES PIR:[id number] DOB: [birth year] DOA: 04/07/2020  PCP: Margaretann Loveless, MD  Admit date: 04/07/2020 Discharge date: 04/09/2020  Admitted From: Home Disposition: Home  Recommendations for Outpatient Follow-up:  1. Follow up with PCP in 1 week with repeat CBC/BMP 2. Outpatient follow-up with cardiology.  Cardiology will arrange for outpatient Holter monitoring 3. Follow up in ED if symptoms worsen or new appear   Home Health: No Equipment/Devices: None  Discharge Condition: Stable CODE STATUS: Full Diet recommendation: Heart healthy  Brief/Interim Summary:  85 year old female with history of hypertension, hyperlipidemia, hypothyroidism and history of PVCs presented with a fall/syncope followed by right hip pain.  On presentation, CT chest, CT head, CT C-spine and CT were for the most part unremarkable.  She was admitted for work-up of syncope.  During the hospitalization, cardiology evaluate the patient.  She has tolerated physical therapy.  Cardiology has cleared the patient for discharge with outpatient follow-up with cardiology and probable outpatient Holter monitoring.  Echo report is pending.  Discharge home today.  Discharge Diagnoses:   Syncope: Questionable cause -Presented with 2 falls with preceding lightheadedness -CT chest, CT head, CT C-spine and CT were for the most part unremarkable. -Cardiology evaluation appreciated.  Echo report is pending. Cardiology has cleared the patient for discharge with outpatient follow-up with cardiology.  Cardiology will arrange for outpatient Holter monitoring. -Patient has tolerated PT eval.  History of frequent PVCs -Cardiology following.    Outpatient follow-up.  Mildly elevated troponin -Troponins did not trend upward significantly.  No chest pain or acute ST-T wave changes.  Cardiology following.    2D echo report is pending.  Essential hypertension -Monitor blood pressure.     Cardiology has made adjustments in antihypertensive regimen.  Continue Coreg, chlorthalidone, hydralazine and isosorbide mononitrate as per cardiology recommendations.  Hydrochlorothiazide, labetalol, amlodipine and losartan have been discontinued.  Outpatient follow-up  Right hip pain secondary to fall -CT hip was unremarkable. -Patient has tolerated PT.  Outpatient follow-up with PT  [facility] -Continue CPAP   Discharge Instructions  Discharge Instructions    Ambulatory referral to Physical Therapy   Complete by: As directed    Diet - low sodium heart healthy   Complete by: As directed    Increase activity slowly   Complete by: As directed      Allergies as of 04/09/2020      Reactions   Other Hives   Yellow dye 6 FOOD COLOR YELLOW POWDER   Ace Inhibitors Cough   Amlodipine Itching   Aspirin    Atorvastatin Other (See Comments)   Conj Estrog-medroxyprogest Ace    PREMPRO 0.3-1.5 MG ORAL TABLET   Prednisone    Raloxifene    EVISTA 60 MG ORAL TABLET   Valsartan Itching      Medication List    STOP taking these medications   amLODipine 5 MG tablet Commonly known as: NORVASC   hydrochlorothiazide 12.5 MG tablet Commonly known as: HYDRODIURIL   labetalol 200 MG tablet Commonly known as: NORMODYNE   losartan-hydrochlorothiazide 100-12.5 MG tablet Commonly known as: HYZAAR   VITAMIN B50 COMPLEX PO     TAKE these medications   acetaminophen 325 MG tablet Commonly known as: TYLENOL Take by mouth every 6 (six) hours as needed for moderate pain.   albuterol 108 (90 Base) MCG/ACT inhaler Commonly known as: VENTOLIN HFA Inhale 2 puffs into the lungs every 6 (six) hours as needed.   carvedilol 6.25 MG tablet Commonly known as:  COREG Take 1 tablet (6.25 mg total) by mouth 2 (two) times daily with a meal.   chlorthalidone 25 MG tablet Commonly known as: HYGROTON Take 1 tablet (25 mg total) by mouth daily. Start taking on: April 10, 2020   fluocinonide 0.05 %  external solution Commonly known as: LIDEX Apply 1 application topically daily as needed.   fluticasone 50 MCG/ACT nasal spray Commonly known as: FLONASE Place 1 spray into both nostrils 2 (two) times daily as needed.   hydrALAZINE 10 MG tablet Commonly known as: APRESOLINE Take 1 tablet (10 mg total) by mouth every 8 (eight) hours.   HYDROcodone-acetaminophen 5-325 MG tablet Commonly known as: NORCO/VICODIN Take 1-2 tablets by mouth every 6 (six) hours as needed for severe pain.   icosapent Ethyl 1 g capsule Commonly known as: VASCEPA Take 2 capsules by mouth 2 (two) times daily with a meal.   isosorbide mononitrate 30 MG 24 hr tablet Commonly known as: IMDUR Take 1 tablet (30 mg total) by mouth daily. Start taking on: April 10, 2020   levothyroxine 125 MCG tablet Commonly known as: SYNTHROID Take 125 mcg by mouth daily before breakfast.   Magnesium Oxide 250 MG Tabs Take 1-2 tablets by mouth daily as needed.   Vitamin D3 25 MCG (1000 UT) Caps once a day   vitamin E 180 MG (400 UNITS) capsule once a day       Follow-up Information    Margaretann Loveless, MD. Schedule an appointment as soon as possible for a visit in 1 week(s).   Specialty: Internal Medicine Contact information: [address] [place] [address] [phone]        Lamar Blinks, MD. Schedule an appointment as soon as possible for a visit in 1 week(s).   Specialty: Cardiology Contact information: [address] [facility] [place] [address] [phone]              Allergies  Allergen Reactions  . Other Hives    Yellow dye 6 FOOD COLOR YELLOW POWDER   . Ace Inhibitors Cough  . Amlodipine Itching  . Aspirin   . Atorvastatin Other (See Comments)  . Conj Estrog-Medroxyprogest Ace     PREMPRO 0.3-1.5 MG ORAL TABLET  . Prednisone   . Raloxifene     EVISTA 60 MG ORAL TABLET  . Valsartan Itching     Consultations:  Cardiology   Procedures/Studies: DG Ribs Unilateral W/Chest Right  Result Date: 04/07/2020 CLINICAL DATA:  Fall EXAM: RIGHT RIBS AND CHEST - 3+ VIEW COMPARISON:  CT 03/07/2019 FINDINGS: Mild cortical step-off along the right posterolateral fourth rib, could reflect a minimally displaced fracture, correlate for point tenderness. No other visible or displaced rib fractures are identified. No visible pneumothorax or effusion. Degenerative changes are present in the imaged spine and shoulders. Features are fairly advanced at the right acromioclavicular and glenohumeral joints. Lung volumes are low with some streaky atelectatic changes in the lung bases including more bandlike opacity in the left lung periphery which could reflect further atelectasis or scarring. Some patchy retrocardiac opacity with branching lucencies suggestive of air bronchograms is noted. No convincing features of edema. No convincing features of edema. The aorta is calcified. The remaining cardiomediastinal contours are unremarkable. IMPRESSION: 1. Mild cortical step-off along the right posterolateral fourth rib, may reflect a minimally displaced fracture, correlate for point tenderness. 2. No other visible or displaced rib fractures. No pneumothorax or effusion. 3. Basilar atelectatic changes. Additional patchy retrocardiac opacity could reflect  further atelectasis or developing consolidation. Electronically Signed   By: Kreg Shropshire M.D.   On: 04/07/2020 19:35   CT Head Wo Contrast  Result Date: 04/07/2020 CLINICAL DATA:  Larey Seat in a retail stool or. EXAM: CT HEAD WITHOUT CONTRAST CT CERVICAL SPINE WITHOUT CONTRAST TECHNIQUE: Multidetector CT imaging of the head and cervical spine was performed following the standard protocol without intravenous contrast. Multiplanar CT image reconstructions of the cervical spine were also generated. COMPARISON:  None. FINDINGS: CT HEAD FINDINGS Brain: No abnormality affects the  brainstem or cerebellum. Cerebral hemispheres do not show accelerated atrophy or evidence of old small or large vessel infarction. No mass, hemorrhage, hydrocephalus or extra-axial collection. Vascular: There is atherosclerotic calcification of the major vessels at the base of the brain. This includes extensive calcification of the right supraclinoid ICA and proximal middle cerebral artery. Skull: Normal Sinuses/Orbits: Clear/normal Other: None CT CERVICAL SPINE FINDINGS Alignment: No traumatic malalignment. Straightening of the normal cervical lordosis. Skull base and vertebrae: No fracture or primary bone lesion. Soft tissues and spinal canal: No traumatic soft tissue finding. Carotid atherosclerotic calcification. Disc levels: Ordinary osteoarthritis at the C1-2 articulation. Subcentimeter cyst in the dens. Chronic fusion of the facets on the right at C2-3. Degenerative spondylosis at C3-4, C4-5, C5-6 and C6-7 with endplate osteophytes and bilateral foraminal encroachment. Apparent solid bridging anterior osteophytes at C4-5. Upper chest: Negative Other: None IMPRESSION: Head CT: No acute or traumatic finding. Normal appearance of the brain parenchyma. Atherosclerotic calcification of the major vessels at the base of the brain, including extensive calcification of the right supraclinoid ICA and proximal middle cerebral artery. Cervical spine CT: No acute or traumatic finding. Degenerative spondylosis with osteophytic foraminal encroachment as noted above. Electronically Signed   By: Paulina Fusi M.D.   On: 04/07/2020 19:56   CT Chest W Contrast  Result Date: 04/07/2020 CLINICAL DATA:  Suspected rib fracture, fall EXAM: CT CHEST WITH CONTRAST TECHNIQUE: Multidetector CT imaging of the chest was performed during intravenous contrast administration. CONTRAST:  75mL OMNIPAQUE IOHEXOL 300 MG/ML  SOLN COMPARISON:  04/07/2020, CT chest 03/07/2019 FINDINGS: Cardiovascular: Mild aortic atherosclerosis. No aneurysmal  dilatation. Mild coronary vascular calcification. Borderline to mild cardiomegaly. No pericardial effusion. Mediastinum/Nodes: Midline trachea. No thyroid mass. Scattered mediastinal lymph nodes, the largest is seen in the precarinal region and measures 11 mm. Esophagus unremarkable Lungs/Pleura: Linear scarring in the left lower lobe. No acute consolidation, pleural effusion, or pneumothorax. Upper Abdomen: Calcified granuloma in the spleen. Possible hypodense lesions within the anterior spleen and within the dome of the spleen near calcified granuloma, measuring up to 2.1 cm. Musculoskeletal: Thoracic alignment within normal limits. Sternum is intact. No definite acute displaced rib fracture. IMPRESSION: 1. No CT evidence for acute intrathoracic abnormality. 2. Scarring in the left lung base 3. Possible hypodense splenic lesions, not clearly visible on previous exams. When the patient is clinically stable and able to follow directions and hold their breath (preferably as an outpatient) further evaluation with dedicated abdominal MRI should be considered. Aortic Atherosclerosis (ICD10-I70.0). Electronically Signed   By: Jasmine Pang M.D.   On: 04/07/2020 23:17   CT Cervical Spine Wo Contrast  Result Date: 04/07/2020 CLINICAL DATA:  Larey Seat in a retail stool or. EXAM: CT HEAD WITHOUT CONTRAST CT CERVICAL SPINE WITHOUT CONTRAST TECHNIQUE: Multidetector CT imaging of the head and cervical spine was performed following the standard protocol without intravenous contrast. Multiplanar CT image reconstructions of the cervical spine were also generated. COMPARISON:  None. FINDINGS: CT HEAD  FINDINGS Brain: No abnormality affects the brainstem or cerebellum. Cerebral hemispheres do not show accelerated atrophy or evidence of old small or large vessel infarction. No mass, hemorrhage, hydrocephalus or extra-axial collection. Vascular: There is atherosclerotic calcification of the major vessels at the base of the brain. This  includes extensive calcification of the right supraclinoid ICA and proximal middle cerebral artery. Skull: Normal Sinuses/Orbits: Clear/normal Other: None CT CERVICAL SPINE FINDINGS Alignment: No traumatic malalignment. Straightening of the normal cervical lordosis. Skull base and vertebrae: No fracture or primary bone lesion. Soft tissues and spinal canal: No traumatic soft tissue finding. Carotid atherosclerotic calcification. Disc levels: Ordinary osteoarthritis at the C1-2 articulation. Subcentimeter cyst in the dens. Chronic fusion of the facets on the right at C2-3. Degenerative spondylosis at C3-4, C4-5, C5-6 and C6-7 with endplate osteophytes and bilateral foraminal encroachment. Apparent solid bridging anterior osteophytes at C4-5. Upper chest: Negative Other: None IMPRESSION: Head CT: No acute or traumatic finding. Normal appearance of the brain parenchyma. Atherosclerotic calcification of the major vessels at the base of the brain, including extensive calcification of the right supraclinoid ICA and proximal middle cerebral artery. Cervical spine CT: No acute or traumatic finding. Degenerative spondylosis with osteophytic foraminal encroachment as noted above. Electronically Signed   By: Nelson Chimes M.D.   On: 04/07/2020 19:56   CT Hip Right Wo Contrast  Result Date: 04/07/2020 CLINICAL DATA:  Right-sided hip pain after fall EXAM: CT OF THE RIGHT HIP WITHOUT CONTRAST TECHNIQUE: Multidetector CT imaging of the right hip was performed according to the standard protocol. Multiplanar CT image reconstructions were also generated. COMPARISON:  Radiograph 04/07/2020 FINDINGS: Bones/Joint/Cartilage No acute displaced fracture or malalignment. The right pubic rami appear intact. The pubic symphysis does not appear widened. No widening of image portions of right SI joint. No significant hip effusion. Ligaments Suboptimally assessed by CT. Muscles and Tendons No intramuscular fluid collections.  No significant  atrophy. Soft tissues Vascular calcifications in the right groin. Minimal edema within the subcutaneous fat of the lateral hip likely due to a mild contusion. IMPRESSION: No acute osseous abnormality. MRI follow-up may be obtained if clinical suspicion for fracture remains high. Electronically Signed   By: Donavan Foil M.D.   On: 04/07/2020 23:04   DG HIP UNILAT WITH PELVIS 2-3 VIEWS RIGHT  Result Date: 04/07/2020 CLINICAL DATA:  [place], right hip injury EXAM: DG HIP (WITH OR WITHOUT PELVIS) 2-3V RIGHT COMPARISON:  None. FINDINGS: Frontal view of the pelvis as well as frontal and frogleg lateral views of the right hip are obtained. There are no acute displaced fractures. The hips are well aligned. Left greater than right hip osteoarthritis. Severe lower lumbar spondylosis. The sacroiliac joints are normal. IMPRESSION: 1. No acute displaced fracture. If nondisplaced fracture remains a clinical concern, MRI could be considered. 2. Prominent lower lumbar spondylosis. 3. Bilateral hip osteoarthritis. Electronically Signed   By: Randa Ngo M.D.   On: 04/07/2020 19:36       Subjective: Patient seen and examined at bedside.  She feels better and thinks that her hip pain is better.  No overnight fever, vomiting or further syncopal episodes in the hospital.  Discharge Exam: Vitals:   04/09/20 0915 04/09/20 1033  BP: (!) 157/84   Pulse: 79   Resp:    Temp:    SpO2:  95%    General: Pt is alert, awake, not in acute distress Cardiovascular: rate controlled, S1/S2 + Respiratory: bilateral decreased breath sounds at bases Abdominal: Soft, NT, ND, bowel  sounds + Extremities: no edema, no cyanosis    The results of significant diagnostics from this hospitalization (including imaging, microbiology, ancillary and laboratory) are listed below for reference.     Microbiology: Recent Results (from the past 240 hour(s))  SARS CORONAVIRUS 2 (TAT 6-24 HRS) Nasopharyngeal Nasopharyngeal Swab      Status: None   Collection Time: 04/07/20 11:35 PM   Specimen: Nasopharyngeal Swab  Result Value Ref Range Status   SARS Coronavirus 2 NEGATIVE NEGATIVE Final    Comment: (NOTE) SARS-CoV-2 target nucleic acids are NOT DETECTED.  The SARS-CoV-2 RNA is generally detectable in upper and lower respiratory specimens during the acute phase of infection. Negative results do not preclude SARS-CoV-2 infection, do not rule out co-infections with other pathogens, and should not be used as the sole basis for treatment or other patient management decisions. Negative results must be combined with clinical observations, patient history, and epidemiological information. The expected result is Negative.  Fact Sheet for Patients: HairSlick.no  Fact Sheet for Healthcare Providers: quierodirigir.com  This test is not yet approved or cleared by the [place] FDA and  has been authorized for detection and/or diagnosis of SARS-CoV-2 by FDA under an Emergency Use Authorization (EUA). This EUA will remain  in effect (meaning this test can be used) for the duration of the COVID-19 declaration under Se ction 564(b)(1) of the Act, 21 U.S.C. section 360bbb-3(b)(1), unless the authorization is terminated or revoked sooner.  Performed at [facility], [address] [place], [place] [address]      Labs: BNP (last 3 results) No results for input(s): BNP in the last 8760 hours. Basic Metabolic Panel: Recent Labs  Lab 04/07/20 1957 04/09/20 0437  NA 144 141  K 3.9 4.5  CL 106 105  CO2 29 30  GLUCOSE 128* 120*  BUN 15 17  CREATININE 0.85 0.72  CALCIUM 9.9 9.6  MG  --  2.0   Liver Function Tests: Recent Labs  Lab 04/07/20 1957 04/09/20 0437  AST 23 17  ALT 13 13  ALKPHOS 58 49  BILITOT 0.9 1.1  PROT 6.6 6.0*  ALBUMIN 3.7 3.3*   No results for input(s): LIPASE, AMYLASE in the last 168 hours. No results for input(s): AMMONIA  in the last 168 hours. CBC: Recent Labs  Lab 04/07/20 1957 04/09/20 0437  WBC 8.6 7.2  NEUTROABS 5.5 5.2  HGB 14.6 14.6  HCT 43.6 42.1  MCV 87.7 85.9  PLT 262 243   Cardiac Enzymes: No results for input(s): CKTOTAL, CKMB, CKMBINDEX, TROPONINI in the last 168 hours. BNP: Invalid input(s): POCBNP CBG: Recent Labs  Lab 04/09/20 0432  GLUCAP 123*   D-Dimer No results for input(s): DDIMER in the last 72 hours. Hgb A1c No results for input(s): HGBA1C in the last 72 hours. Lipid Profile No results for input(s): CHOL, HDL, LDLCALC, TRIG, CHOLHDL, LDLDIRECT in the last 72 hours. Thyroid function studies Recent Labs    04/07/20 1957  TSH 0.396   Anemia work up No results for input(s): VITAMINB12, FOLATE, FERRITIN, TIBC, IRON, RETICCTPCT in the last 72 hours. Urinalysis    Component Value Date/Time   COLORURINE COLORLESS (A) 04/07/2020 1958   APPEARANCEUR CLEAR (A) 04/07/2020 1958   LABSPEC 1.002 (L) 04/07/2020 1958   PHURINE 7.0 04/07/2020 1958   GLUCOSEU NEGATIVE 04/07/2020 1958   HGBUR NEGATIVE 04/07/2020 1958   BILIRUBINUR NEGATIVE 04/07/2020 1958   KETONESUR NEGATIVE 04/07/2020 1958   PROTEINUR NEGATIVE 04/07/2020 1958   NITRITE NEGATIVE  04/07/2020 1958   LEUKOCYTESUR NEGATIVE 04/07/2020 1958   Sepsis Labs Invalid input(s): PROCALCITONIN,  WBC,  LACTICIDVEN Microbiology Recent Results (from the past 240 hour(s))  SARS CORONAVIRUS 2 (TAT 6-24 HRS) Nasopharyngeal Nasopharyngeal Swab     Status: None   Collection Time: 04/07/20 11:35 PM   Specimen: Nasopharyngeal Swab  Result Value Ref Range Status   SARS Coronavirus 2 NEGATIVE NEGATIVE Final    Comment: (NOTE) SARS-CoV-2 target nucleic acids are NOT DETECTED.  The SARS-CoV-2 RNA is generally detectable in upper and lower respiratory specimens during the acute phase of infection. Negative results do not preclude SARS-CoV-2 infection, do not rule out co-infections with other pathogens, and should not be used  as the sole basis for treatment or other patient management decisions. Negative results must be combined with clinical observations, patient history, and epidemiological information. The expected result is Negative.  Fact Sheet for Patients: HairSlick.no  Fact Sheet for Healthcare Providers: quierodirigir.com  This test is not yet approved or cleared by the [place] FDA and  has been authorized for detection and/or diagnosis of SARS-CoV-2 by FDA under an Emergency Use Authorization (EUA). This EUA will remain  in effect (meaning this test can be used) for the duration of the COVID-19 declaration under Se ction 564(b)(1) of the Act, 21 U.S.C. section 360bbb-3(b)(1), unless the authorization is terminated or revoked sooner.  Performed at [facility], [address], [place], [place] [address]      Time coordinating discharge: 35 minutes  SIGNED:   Glade Lloyd, MD  [facility] 04/09/2020, 11:47 AM

## 2020-04-09 NOTE — Progress Notes (Signed)
Andrea Phillips was seen and examined.  She is stable from a cardiac standpoint. Discussed results of the echocardiogram with her.  Medication adjustments were reconciled prior to discharge.  She was escorted to [facility] and a 2 week Holter monitor was placed.  She has an in office visit scheduled with Dr. Gwen Pounds on 04/14/20.

## 2020-04-09 NOTE — Progress Notes (Signed)
   04/09/20 1100  Clinical Encounter Type  Visited With Patient  Visit Type Follow-up  Referral From Chaplain  Consult/Referral To Chaplain  Ch followed-up with Pt for a AD. Pt daughter is going to fill AD out and take it to a bank. Pt is getting released today. Ch will follow-up with AD when daughter brings it back to the hospital.

## 2020-06-12 ENCOUNTER — Other Ambulatory Visit: Payer: Self-pay | Admitting: Internal Medicine

## 2020-06-12 DIAGNOSIS — Z1231 Encounter for screening mammogram for malignant neoplasm of breast: Secondary | ICD-10-CM

## 2020-07-13 ENCOUNTER — Ambulatory Visit
Admission: RE | Admit: 2020-07-13 | Discharge: 2020-07-13 | Disposition: A | Discharge status: Home / Self Care | Payer: Medicare Other | Source: Ambulatory Visit | Attending: Internal Medicine | Admitting: Internal Medicine

## 2020-07-13 ENCOUNTER — Other Ambulatory Visit: Payer: Self-pay

## 2020-07-13 DIAGNOSIS — Z1231 Encounter for screening mammogram for malignant neoplasm of breast: Secondary | ICD-10-CM | POA: Diagnosis present

## 2020-12-21 ENCOUNTER — Emergency Department: Payer: Medicare Other

## 2020-12-21 ENCOUNTER — Emergency Department
Admission: EM | Admit: 2020-12-21 | Discharge: 2020-12-22 | Disposition: A | Discharge status: Home / Self Care | Payer: Medicare Other | Attending: Emergency Medicine | Admitting: Emergency Medicine

## 2020-12-21 ENCOUNTER — Other Ambulatory Visit: Payer: Self-pay

## 2020-12-21 ENCOUNTER — Encounter: Payer: Self-pay | Admitting: Emergency Medicine

## 2020-12-21 DIAGNOSIS — E039 Hypothyroidism, unspecified: Secondary | ICD-10-CM | POA: Insufficient documentation

## 2020-12-21 DIAGNOSIS — R1011 Right upper quadrant pain: Secondary | ICD-10-CM | POA: Insufficient documentation

## 2020-12-21 DIAGNOSIS — R7989 Other specified abnormal findings of blood chemistry: Secondary | ICD-10-CM | POA: Insufficient documentation

## 2020-12-21 DIAGNOSIS — I700 Atherosclerosis of aorta: Secondary | ICD-10-CM | POA: Insufficient documentation

## 2020-12-21 DIAGNOSIS — I161 Hypertensive emergency: Secondary | ICD-10-CM

## 2020-12-21 DIAGNOSIS — Z96652 Presence of left artificial knee joint: Secondary | ICD-10-CM | POA: Insufficient documentation

## 2020-12-21 DIAGNOSIS — J45909 Unspecified asthma, uncomplicated: Secondary | ICD-10-CM | POA: Diagnosis not present

## 2020-12-21 DIAGNOSIS — Z79899 Other long term (current) drug therapy: Secondary | ICD-10-CM | POA: Insufficient documentation

## 2020-12-21 DIAGNOSIS — I10 Essential (primary) hypertension: Secondary | ICD-10-CM | POA: Insufficient documentation

## 2020-12-21 DIAGNOSIS — R778 Other specified abnormalities of plasma proteins: Secondary | ICD-10-CM

## 2020-12-21 LAB — BASIC METABOLIC PANEL
Anion gap: 11 (ref 5–15)
BUN: 15 mg/dL (ref 8–23)
CO2: 25 mmol/L (ref 22–32)
Calcium: 9.9 mg/dL (ref 8.9–10.3)
Chloride: 104 mmol/L (ref 98–111)
Creatinine, Ser: 0.67 mg/dL (ref 0.44–1.00)
GFR, Estimated: >60 mL/min (ref >60)
Glucose, Bld: 109 mg/dL — ABNORMAL HIGH (ref 70–99)
Potassium: 3.9 mmol/L (ref 3.5–5.1)
Sodium: 140 mmol/L (ref 135–145)

## 2020-12-21 LAB — CBC
HCT: 43.5 % (ref 36.0–46.0)
Hemoglobin: 14.7 g/dL (ref 12.0–15.0)
MCH: 30.1 pg (ref 26.0–34.0)
MCHC: 33.8 g/dL (ref 30.0–36.0)
MCV: 89 fL (ref 80.0–100.0)
Platelets: 283 10*3/uL (ref 150–400)
RBC: 4.89 MIL/uL (ref 3.87–5.11)
RDW: 13.3 % (ref 11.5–15.5)
WBC: 7.3 10*3/uL (ref 4.0–10.5)
nRBC: 0 % (ref 0.0–0.2)

## 2020-12-21 LAB — TROPONIN I (HIGH SENSITIVITY)
Troponin I (High Sensitivity): 43 ng/L — ABNORMAL HIGH (ref <18)
Troponin I (High Sensitivity): 49 ng/L — ABNORMAL HIGH (ref <18)

## 2020-12-21 MED ORDER — LABETALOL HCL 5 MG/ML IV SOLN: 10.0000 mg | Freq: Once | INTRAVENOUS | Status: DC

## 2020-12-21 MED ORDER — IOHEXOL 300 MG/ML  SOLN
100.0000 mL | Freq: Once | INTRAMUSCULAR | Status: AC | PRN
  Administered 2020-12-21: 100 mL via INTRAVENOUS

## 2020-12-21 MED ORDER — FENTANYL CITRATE (PF) 100 MCG/2ML IJ SOLN
50.0000 ug | Freq: Once | INTRAMUSCULAR | Status: AC
  Administered 2020-12-21: 50 ug via INTRAVENOUS
  Filled 2020-12-21: qty 2

## 2020-12-21 MED ORDER — LABETALOL HCL 5 MG/ML IV SOLN
10.0000 mg | Freq: Once | INTRAVENOUS | Status: AC
  Administered 2020-12-21: 10 mg via INTRAVENOUS
  Filled 2020-12-21: qty 4

## 2020-12-21 MED ORDER — FENTANYL CITRATE (PF) 100 MCG/2ML IJ SOLN
50.0000 ug | Freq: Once | INTRAMUSCULAR | Status: AC
Start: 2020-12-21 — End: 2020-12-21
  Administered 2020-12-21: 50 ug via INTRAVENOUS
  Filled 2020-12-21: qty 2

## 2020-12-21 NOTE — ED Triage Notes (Signed)
Pt to ED from home c/o right rib pain radiating to back since about 1830 tonight.  Pain is intermittent and sharp, denies n/v/d or SOB.  Pt A&Ox4, chest rise even and unlabored, denies leg swelling and nothing in particular makes pain better or worse.

## 2020-12-21 NOTE — ED Notes (Signed)
Katrinka Blazing, MD notified about pt high BP

## 2020-12-21 NOTE — ED Provider Notes (Signed)
[facility] Emergency Department Provider Note  ____________________________________________   Event Date/Time   First MD Initiated Contact with Patient 12/21/20 2143     (approximate)  I have reviewed the triage vital signs and the nursing notes.   HISTORY  Chief Complaint Chest Pain   HPI Andrea Phillips is a 86 y.o. female with a past medical history of RA, gout, HTN, hypothyroidism, OSA and previous cholecystectomy who presents for assessment of right lower chest and right upper quadrant abdominal pain that began around 6 AM tonight.  She states it is rating into her back and upper right shoulder.  No clearly fitting aggravating factors.  This occurred before she ate dinner.  Denies any headache, earache, sore throat, fevers, chills, cough, shortness of breath, left-sided chest pain, lower abdominal pain, urinary symptoms or any other acute sick symptoms.  No recent falls or injuries.  Denies any significant recent EtOH or illicit drug use.  No clear alleviating aggravating factors aside from palpation which seem to make the pain worse         Past Medical History:  Diagnosis Date  . Arthritis    ra  . Asthma   . Dysrhythmia   . Edema extremities   . GERD (gastroesophageal reflux disease)   . Gout   . Headache   . History of hiatal hernia   . Hypertension   . Hypothyroidism   . Shortness of breath dyspnea   . Sleep apnea     Patient Active Problem List   Diagnosis Date Noted  . Syncope and collapse 04/07/2020  . Right hip pain 04/07/2020  . Frequent PVCs 04/07/2020  . OSA (obstructive sleep apnea) 04/07/2020  . Elevated troponin 04/07/2020  . Varicose veins of both lower extremities with pain 04/04/2017  . Essential hypertension 04/04/2017    Past Surgical History:  Procedure Laterality Date  . APPENDECTOMY    . BACK SURGERY    . CARDIAC CATHETERIZATION    . CATARACT EXTRACTION W/PHACO Left 03/09/2015   Procedure: CATARACT  EXTRACTION PHACO AND INTRAOCULAR LENS PLACEMENT (IOC);  Surgeon: Sallee Lange, MD;  Location: [facility] ORS;  Service: Ophthalmology;  Laterality: Left;  [place] 01:31 AP% 26.1 CDE 43.72  . CHOLECYSTECTOMY    . EYE SURGERY     retina  . JOINT REPLACEMENT     left knee    Prior to Admission medications   Medication Sig Start Date End Date Taking? Authorizing Provider  acetaminophen (TYLENOL) 325 MG tablet Take by mouth every 6 (six) hours as needed for moderate pain.    [provider]  albuterol (VENTOLIN HFA) 108 (90 Base) MCG/ACT inhaler Inhale 2 puffs into the lungs every 6 (six) hours as needed.    [provider]  carvedilol (COREG) 6.25 MG tablet Take 1 tablet (6.25 mg total) by mouth 2 (two) times daily with a meal. 04/09/20   Glade Lloyd, MD  chlorthalidone (HYGROTON) 25 MG tablet Take 1 tablet (25 mg total) by mouth daily. 04/10/20   Glade Lloyd, MD  Cholecalciferol (VITAMIN D3) 1000 units CAPS once a day 11/07/14   [provider]  fluocinonide (LIDEX) 0.05 % external solution Apply 1 application topically daily as needed. 11/05/19   [provider]  fluticasone (FLONASE) 50 MCG/ACT nasal spray Place 1 spray into both nostrils 2 (two) times daily as needed. 09/22/15   [provider]  hydrALAZINE (APRESOLINE) 10 MG tablet Take 1 tablet (10 mg total) by mouth every 8 (eight)  hours. 04/09/20   Glade Lloyd, MD  HYDROcodone-acetaminophen (NORCO/VICODIN) 5-325 MG tablet Take 1-2 tablets by mouth every 6 (six) hours as needed for severe pain. 04/09/20   Glade Lloyd, MD  icosapent Ethyl (VASCEPA) 1 g capsule Take 2 capsules by mouth 2 (two) times daily with a meal. 12/17/19   [provider]  isosorbide mononitrate (IMDUR) 30 MG 24 hr tablet Take 1 tablet (30 mg total) by mouth daily. 04/10/20   Glade Lloyd, MD  levothyroxine (SYNTHROID, LEVOTHROID) 125 MCG tablet Take 125 mcg by mouth daily before breakfast.    [provider]  Magnesium Oxide 250 MG TABS Take 1-2 tablets by mouth daily as needed.    [provider]  vitamin E 400 UNIT capsule once a day 01/06/15   [provider]    Allergies Other, Ace inhibitors, Amlodipine, Aspirin, Atorvastatin, Conj estrog-medroxyprogest ace, Prednisone, Raloxifene, and Valsartan  Family History  Problem Relation Age of Onset  . Breast cancer Cousin     Social History Social History   Tobacco Use  . Smoking status: Never Smoker  . Smokeless tobacco: Never Used  Substance Use Topics  . Alcohol use: No  . Drug use: No    Review of Systems  Review of Systems  Constitutional: Negative for chills and fever.  HENT: Negative for sore throat.   Eyes: Negative for pain.  Respiratory: Negative for cough and stridor.   Cardiovascular: Negative for chest pain.  Gastrointestinal: Positive for abdominal pain ( under R breast). Negative for vomiting.  Skin: Negative for rash.  Neurological: Negative for seizures, loss of consciousness and headaches.  Psychiatric/Behavioral: Negative for suicidal ideas.  All other systems reviewed and are negative.     ____________________________________________   PHYSICAL EXAM:  VITAL SIGNS: ED Triage Vitals  Enc Vitals Group     BP 12/21/20 2051 (!) 216/98     Pulse Rate 12/21/20 2051 68     Resp 12/21/20 2051 16     Temp 12/21/20 2051 (!) 97.5 F (36.4 C)     Temp Source 12/21/20 2051 Oral     SpO2 12/21/20 2051 96 %     Weight 12/21/20 2052 162 lb (73.5 kg)     Height 12/21/20 2052 5' (1.524 m)     Head Circumference --      Peak Flow --      Pain Score 12/21/20 2052 8     Pain Loc --      Pain Edu? --      Excl. in GC? --    Vitals:   12/21/20 2259 12/21/20 2300  BP: (!) 210/104 (!) 214/100  Pulse: 78 70  Resp: 16   Temp:    SpO2: 97% 95%   Physical Exam Vitals and nursing note reviewed.  Constitutional:      General: She is not in acute distress.    Appearance: She is  well-developed and well-nourished.  HENT:     Head: Normocephalic and atraumatic.     Right Ear: External ear normal.     Left Ear: External ear normal.     Nose: Nose normal.  Eyes:     Conjunctiva/sclera: Conjunctivae normal.  Cardiovascular:     Rate and Rhythm: Normal rate and regular rhythm.     Heart sounds: No murmur heard.   Pulmonary:     Effort: Pulmonary effort is normal. No respiratory distress.     Breath sounds: Normal breath sounds.  Abdominal:  Palpations: Abdomen is soft.     Tenderness: There is abdominal tenderness in the right upper quadrant. There is no right CVA tenderness or left CVA tenderness.  Musculoskeletal:        General: No edema.     Cervical back: Neck supple.  Skin:    General: Skin is warm and dry.  Neurological:     Mental Status: She is alert and oriented to person, place, and time.  Psychiatric:        Mood and Affect: Mood and affect and mood normal.      ____________________________________________   LABS (all labs ordered are listed, but only abnormal results are displayed)  Labs Reviewed  BASIC METABOLIC PANEL - Abnormal; Notable for the following components:      Result Value   Glucose, Bld 109 (*)    All other components within normal limits  TROPONIN I (HIGH SENSITIVITY) - Abnormal; Notable for the following components:   Troponin I (High Sensitivity) 43 (*)    All other components within normal limits  CBC  HEPATIC FUNCTION PANEL  LIPASE, BLOOD  TROPONIN I (HIGH SENSITIVITY)   ____________________________________________  EKG  Sinus rhythm with a ventricular rate of 85, normal axis, unremarkable intervals, nonspecific ST changes in the inferior lateral leads without any other clearance of acute ischemia or other significant underlying arrhythmia. ____________________________________________  RADIOLOGY  ED MD interpretation: No clear focal consolidation, overt edema, large effusion, pneumothorax or other clear  acute intrathoracic process.  Official radiology report(s): DG Chest 2 View  Result Date: 12/21/2020 CLINICAL DATA:  Chest pain EXAM: CHEST - 2 VIEW COMPARISON:  04/07/2020 FINDINGS: Frontal and lateral views of the chest demonstrate an unremarkable cardiac silhouette. Stable central vascular congestion without airspace disease, effusion, or pneumothorax. No acute bony abnormalities. IMPRESSION: 1. Stable chest, no acute process. Electronically Signed   By: Sharlet Salina M.D.   On: 12/21/2020 21:23   CT ABDOMEN PELVIS W CONTRAST  Result Date: 12/21/2020 CLINICAL DATA:  Acute, nonlocalized abdominal pain EXAM: CT ABDOMEN AND PELVIS WITH CONTRAST TECHNIQUE: Multidetector CT imaging of the abdomen and pelvis was performed using the standard protocol following bolus administration of intravenous contrast. CONTRAST:  OMNIPAQUE IOHEXOL 300 MG/ML  SOLN COMPARISON:  None FINDINGS: Lower chest: Minimal left basilar atelectasis. Mild global cardiomegaly. Small hiatal hernia. Hepatobiliary: Left portal-hepatic venous shunt noted within segment 3 of the liver. No associated parenchymal atrophy. No associated mass lesion. Additional smaller foci of vascular shunting noted within segment 6 and 7 of the liver peripherally. There is resultant early filling of the left hepatic vein and slightly early filling of the right hepatic vein. Geographic area of hypoattenuation involving the posterior right hepatic lobe may relate to differential perfusion and resultant transient hepatic attenuation difference. No intra or extrahepatic biliary ductal dilation. Cholecystectomy has been performed. Pancreas: Unremarkable Spleen: Unremarkable Adrenals/Urinary Tract: The adrenal glands are unremarkable. The kidneys are normal in size and position. Small simple exophytic cortical cyst arises from the lower pole of the right kidney. The kidneys are otherwise unremarkable. The bladder is unremarkable. Stomach/Bowel: Stomach, small  bowel, and large bowel are unremarkable save for moderate sigmoid diverticulosis. Appendix absent. No free intraperitoneal gas or fluid. Vascular/Lymphatic: Moderate aortoiliac atherosclerotic calcification. No aortic aneurysm. No pathologic adenopathy within the abdomen and pelvis. Reproductive: Uterus and bilateral adnexa are unremarkable. Other: Rectum unremarkable.  Small fat containing umbilical hernia. Musculoskeletal: Degenerative changes are seen within the lumbar spine. Probable resection of the spinous process of L4  has been performed with extensive heterotopic ossification within the region of surgical resection. Resultant moderate to severe bilateral neuroforaminal narrowing at L2-3, L3-4, and L4-5. Multifactorial moderate central canal stenosis at L2-3 and L3-4. No focal lytic or blastic bone lesions. IMPRESSION: Multiple hepatic vascular shunts including a relatively large portal-hepatic venous shunt within segment 3 of the left hepatic lobe. Multiple shunts can rarely be seen in the setting of hereditary hemorrhagic telangiectasia and genetic testing may be helpful for further management. Additionally, while overwhelmingly benign, hepatic-portal venous shunting can occasionally result in referable tenderness or elevated serum ammonia levels. Interventional radiologic consultation may be helpful for further management. Moderate sigmoid diverticulosis. Postsurgical changes within the lumbar spine with multilevel neuroforaminal narrowing and central canal stenosis, not well assessed on this examination. Aortic Atherosclerosis (ICD10-I70.0). Electronically Signed   By: Helyn Numbers MD   On: 12/21/2020 22:55    ____________________________________________   PROCEDURES  Procedure(s) performed (including Critical Care):  .1-3 Lead EKG Interpretation Performed by: Gilles Chiquito, MD Authorized by: Gilles Chiquito, MD     Interpretation: normal     ECG rate assessment: normal     Rhythm:  sinus rhythm     Ectopy: none     Conduction: normal       ____________________________________________   INITIAL IMPRESSION / ASSESSMENT AND PLAN / ED COURSE      Patient presents with above to history exam for assessment of some right upper quadrant abdominal pain that began tonight around 6 AM.  No clear alleviating every factors.  No prior similar episodes.  Patient is tender in her right upper quadrant.  On arrival she is hypertensive with a BP of 216/98 with otherwise stable vital signs on room air.  Differential includes but is not limited to pneumonia, PE, symptomatic loculated effusion, hepatitis, cholestasis, pancreatitis and possible duodenitis.  Patient does have a history of cholecystectomy.  ECG does have some nonspecific findings I will initial troponin is slightly elevated at 43 compared to 35 a month ago I suspect this is related to some mild ischemia in setting of hypertension I have very low suspicion for acute coronary thrombosis at this time.  BMP shows no significant electrolyte or metabolic derangements.  CBC shows no leukocytosis or acute anemia.  We will plan to obtain hepatic function panel to assess for LFT elevations and cholestasis.  We will also plan to obtain a lipase.  CT obtained shows no evidence of dilated common bile duct, pancreatitis or other clear acute abdominal pelvic pathology aside from multiple hepatic vascular shunts with 1 relatively large portal hepatic venous shunt.  There is evidence of diverticulosis without evidence of diverticulitis and no other acute abdominal pelvic process that would explain patient symptoms.  Will ease hepatic shocks are certainly not normal do not believe they are acute and there does not appear to be evidence of thrombosis or other immediate life-threatening process on CT.  Care patient signed over to oncoming rider at approximately 2300.  Plan is to follow-up medication panel lipase and repeat troponin.  Patient will  be treated with 100 mcg of fentanyl and labetalol.  Troponin is stable or downtrending and she is feeling better with otherwise unremarkable hepatic function panel lipase she is likely safe for discharge with continued outpatient evaluation.  ____________________________________________   FINAL CLINICAL IMPRESSION(S) / ED DIAGNOSES  Final diagnoses:  RUQ pain  Troponin I above reference range  Aortic atherosclerosis (HCC)  Hypertensive emergency    Medications  fentaNYL (SUBLIMAZE)  injection 50 mcg (has no administration in time range)  labetalol (NORMODYNE) injection 10 mg (has no administration in time range)  fentaNYL (SUBLIMAZE) injection 50 mcg (50 mcg Intravenous Given 12/21/20 2217)  iohexol (OMNIPAQUE) 300 MG/ML solution 100 mL (100 mLs Intravenous Contrast Given 12/21/20 2227)     ED Discharge Orders    None       Note:  This document was prepared using Dragon voice recognition software and may include unintentional dictation errors.   Gilles Chiquito, MD 12/21/20 [phone]

## 2020-12-21 NOTE — ED Provider Notes (Signed)
-----------------------------------------   11:06 PM on 12/21/2020 -----------------------------------------  Assuming care from Dr. Michiel Sites.  In short, Andrea Phillips is a 86 y.o. female with a chief complaint of back pain, chest pain, and poorly controlled blood pressure.  Refer to the original H&P for additional details.  The current plan of care is to follow-up on labs and reassess.   ----------------------------------------- 1:39 AM on 12/22/2020 -----------------------------------------  The patient's repeat troponin is 49 which is up 6 points from before which is generally reassuring.  Hepatic function tests and lipase are normal.  I reassessed the patient and she said that she is having no chest pain and is still having some pain in the right lower back but is nonradiating.  I went over her results with her and her family member who is at bedside.  We talked about admission versus going home that there is not a clear indication for admission but we could admit if she feels strongly about it.  They understandably were not certain about what to do although the patient says she would prefer to go home if possible.  She is a patient of Dr. Gwen Pounds, so I called him and we talked about it and he is also comfortable with the plan for her to be discharged and said that he will reach out to follow-up with her in clinic within a day or 2.  Patient is comfortable with this plan and I discharged her with my usual and customary return precautions.   Andrea Rose, MD 12/22/20 [phone]

## 2020-12-22 LAB — HEPATIC FUNCTION PANEL
ALT: 14 U/L (ref 0–44)
AST: 18 U/L (ref 15–41)
Albumin: 4.1 g/dL (ref 3.5–5.0)
Alkaline Phosphatase: 60 U/L (ref 38–126)
Bilirubin, Direct: 0.1 mg/dL (ref 0.0–0.2)
Indirect Bilirubin: 0.8 mg/dL (ref 0.3–0.9)
Total Bilirubin: 0.9 mg/dL (ref 0.3–1.2)
Total Protein: 7.4 g/dL (ref 6.5–8.1)

## 2020-12-22 LAB — LIPASE, BLOOD: Lipase: 25 U/L (ref 11–51)

## 2020-12-22 NOTE — Discharge Instructions (Addendum)
As we discussed, your work-up today was generally reassuring even though your blood pressure remains elevated.  We spoke with Dr. Gwen Pounds and he agreed that it should be fine for you to go home. His staff may reach out to you to schedule a close follow up appointment, but you can also call his office and let them know that he said he could see you in clinic within a day or two.  Continue to take all your medications as prescribed, and use over-the-counter ibuprofen and or Tylenol according to label instructions as needed for your back pain (unless doctors have told you to avoid those medications).  Return to the Emergency Department if you develop new or worsening symptoms that concern you.

## 2021-04-01 ENCOUNTER — Other Ambulatory Visit: Payer: Self-pay

## 2021-04-01 ENCOUNTER — Inpatient Hospital Stay
Admission: EM | Admit: 2021-04-01 | Discharge: 2021-04-04 | DRG: 280 | Disposition: A | Discharge status: Home Health | Payer: Medicare Other | Attending: Student | Admitting: Student

## 2021-04-01 ENCOUNTER — Emergency Department: Payer: Medicare Other

## 2021-04-01 DIAGNOSIS — I081 Rheumatic disorders of both mitral and tricuspid valves: Secondary | ICD-10-CM | POA: Diagnosis present

## 2021-04-01 DIAGNOSIS — R778 Other specified abnormalities of plasma proteins: Secondary | ICD-10-CM | POA: Diagnosis present

## 2021-04-01 DIAGNOSIS — I110 Hypertensive heart disease with heart failure: Secondary | ICD-10-CM | POA: Diagnosis present

## 2021-04-01 DIAGNOSIS — I5023 Acute on chronic systolic (congestive) heart failure: Secondary | ICD-10-CM | POA: Diagnosis present

## 2021-04-01 DIAGNOSIS — Z9049 Acquired absence of other specified parts of digestive tract: Secondary | ICD-10-CM

## 2021-04-01 DIAGNOSIS — G4733 Obstructive sleep apnea (adult) (pediatric): Secondary | ICD-10-CM | POA: Diagnosis present

## 2021-04-01 DIAGNOSIS — M109 Gout, unspecified: Secondary | ICD-10-CM | POA: Diagnosis present

## 2021-04-01 DIAGNOSIS — I214 Non-ST elevation (NSTEMI) myocardial infarction: Secondary | ICD-10-CM | POA: Diagnosis not present

## 2021-04-01 DIAGNOSIS — M069 Rheumatoid arthritis, unspecified: Secondary | ICD-10-CM | POA: Diagnosis present

## 2021-04-01 DIAGNOSIS — I10 Essential (primary) hypertension: Secondary | ICD-10-CM | POA: Diagnosis present

## 2021-04-01 DIAGNOSIS — I509 Heart failure, unspecified: Secondary | ICD-10-CM

## 2021-04-01 DIAGNOSIS — Z79899 Other long term (current) drug therapy: Secondary | ICD-10-CM

## 2021-04-01 DIAGNOSIS — E669 Obesity, unspecified: Secondary | ICD-10-CM | POA: Diagnosis present

## 2021-04-01 DIAGNOSIS — Z79890 Hormone replacement therapy (postmenopausal): Secondary | ICD-10-CM

## 2021-04-01 DIAGNOSIS — Z28310 Unvaccinated for covid-19: Secondary | ICD-10-CM

## 2021-04-01 DIAGNOSIS — Z6831 Body mass index (BMI) 31.0-31.9, adult: Secondary | ICD-10-CM

## 2021-04-01 DIAGNOSIS — I5022 Chronic systolic (congestive) heart failure: Secondary | ICD-10-CM | POA: Diagnosis present

## 2021-04-01 DIAGNOSIS — Z96652 Presence of left artificial knee joint: Secondary | ICD-10-CM | POA: Diagnosis present

## 2021-04-01 DIAGNOSIS — K219 Gastro-esophageal reflux disease without esophagitis: Secondary | ICD-10-CM | POA: Diagnosis present

## 2021-04-01 DIAGNOSIS — J4521 Mild intermittent asthma with (acute) exacerbation: Secondary | ICD-10-CM | POA: Diagnosis present

## 2021-04-01 DIAGNOSIS — Z888 Allergy status to other drugs, medicaments and biological substances status: Secondary | ICD-10-CM

## 2021-04-01 DIAGNOSIS — Z886 Allergy status to analgesic agent status: Secondary | ICD-10-CM

## 2021-04-01 DIAGNOSIS — R0902 Hypoxemia: Secondary | ICD-10-CM | POA: Diagnosis present

## 2021-04-01 DIAGNOSIS — E039 Hypothyroidism, unspecified: Secondary | ICD-10-CM | POA: Diagnosis present

## 2021-04-01 DIAGNOSIS — R5381 Other malaise: Secondary | ICD-10-CM | POA: Diagnosis present

## 2021-04-01 DIAGNOSIS — Z20822 Contact with and (suspected) exposure to covid-19: Secondary | ICD-10-CM | POA: Diagnosis present

## 2021-04-01 DIAGNOSIS — R7989 Other specified abnormal findings of blood chemistry: Secondary | ICD-10-CM | POA: Diagnosis present

## 2021-04-01 DIAGNOSIS — E785 Hyperlipidemia, unspecified: Secondary | ICD-10-CM | POA: Diagnosis present

## 2021-04-01 LAB — BRAIN NATRIURETIC PEPTIDE: B Natriuretic Peptide: 559.2 pg/mL — ABNORMAL HIGH (ref 0.0–100.0)

## 2021-04-01 MED ORDER — ALBUTEROL SULFATE (2.5 MG/3ML) 0.083% IN NEBU
2.5000 mg | INHALATION_SOLUTION | Freq: Once | RESPIRATORY_TRACT | Status: DC
  Filled 2021-04-01: qty 3

## 2021-04-01 MED ORDER — IPRATROPIUM-ALBUTEROL 0.5-2.5 (3) MG/3ML IN SOLN: 3.0000 mL | Freq: Once | RESPIRATORY_TRACT | Status: DC

## 2021-04-01 MED ORDER — ALBUTEROL SULFATE HFA 108 (90 BASE) MCG/ACT IN AERS: 2.0000 | INHALATION_SPRAY | RESPIRATORY_TRACT | Status: DC | PRN

## 2021-04-01 MED ORDER — FUROSEMIDE 10 MG/ML IJ SOLN
20.0000 mg | Freq: Once | INTRAMUSCULAR | Status: AC
  Administered 2021-04-02: 20 mg via INTRAVENOUS
  Filled 2021-04-01: qty 4

## 2021-04-01 MED ORDER — HYDROCOD POLST-CPM POLST ER 10-8 MG/5ML PO SUER
5.0000 mL | Freq: Once | ORAL | Status: AC
Start: 2021-04-02 — End: 2021-04-02
  Administered 2021-04-02: 5 mL via ORAL
  Filled 2021-04-01: qty 5

## 2021-04-01 NOTE — ED Triage Notes (Signed)
Pt presents to the [facility] via EMS from home with c/o shortness of breath and cough that began 1 week ago and progressively worsened. EMS states that pt had room air saturation of 92% with bilateral wheezing. Pt was given 2 duoneb treatments en route and 125mg  Solumedrol IV. Pt reports sick exposure with family at home with similar symptoms.

## 2021-04-01 NOTE — ED Provider Notes (Signed)
[facility] Emergency Department Provider Note   ____________________________________________   Event Date/Time   First MD Initiated Contact with Patient 04/01/21 2307     (approximate)  I have reviewed the triage vital signs and the nursing notes.   HISTORY  Chief Complaint Shortness of Breath    HPI Andrea Phillips is a 86 y.o. female brought to the ED via EMS from home with a chief complaint of cough and shortness of breath.  Patient reports a 1 week history of cold-like symptoms.  Has had sick family members who have tested negative for COVID-19.  EMS reports room air saturations 92% with wheezing.  Patient given 2 duo nebs and 125 mg IV Solu-Medrol prior to arrival.  Denies fever, chest pain, abdominal pain, nausea, vomiting or dizziness.  Patient is unvaccinated against COVID-19.     Past Medical History:  Diagnosis Date   Arthritis    ra   Asthma    Dysrhythmia    Edema extremities    GERD (gastroesophageal reflux disease)    Gout    Headache    History of hiatal hernia    Hypertension    Hypothyroidism    Shortness of breath dyspnea    Sleep apnea     Patient Active Problem List   Diagnosis Date Noted   NSTEMI (non-ST elevated myocardial infarction) (HCC) 04/02/2021   Acute on chronic systolic CHF (congestive heart failure) (HCC) 04/02/2021   Hypothyroidism 04/02/2021   Syncope and collapse 04/07/2020   Right hip pain 04/07/2020   Frequent PVCs 04/07/2020   OSA (obstructive sleep apnea) 04/07/2020   Elevated troponin 04/07/2020   Varicose veins of both lower extremities with pain 04/04/2017   Essential hypertension 04/04/2017    Past Surgical History:  Procedure Laterality Date   APPENDECTOMY     BACK SURGERY     CARDIAC CATHETERIZATION     CATARACT EXTRACTION W/PHACO Left 03/09/2015   Procedure: CATARACT EXTRACTION PHACO AND INTRAOCULAR LENS PLACEMENT (IOC);  Surgeon: Sallee Lange, MD;  Location: [facility] ORS;   Service: Ophthalmology;  Laterality: Left;  [place] 01:31 AP% 26.1 CDE 43.72   CHOLECYSTECTOMY     EYE SURGERY     retina   JOINT REPLACEMENT     left knee    Prior to Admission medications   Medication Sig Start Date End Date Taking? Authorizing Provider  acetaminophen (TYLENOL) 325 MG tablet Take by mouth every 6 (six) hours as needed for moderate pain.    [provider]  albuterol (VENTOLIN HFA) 108 (90 Base) MCG/ACT inhaler Inhale 2 puffs into the lungs every 6 (six) hours as needed.    [provider]  carvedilol (COREG) 12.5 MG tablet Take 12.5 mg by mouth 2 (two) times daily. 01/12/21   [provider]  carvedilol (COREG) 6.25 MG tablet Take 1 tablet (6.25 mg total) by mouth 2 (two) times daily with a meal. 04/09/20   Glade Lloyd, MD  chlorthalidone (HYGROTON) 25 MG tablet Take 1 tablet (25 mg total) by mouth daily. 04/10/20   Glade Lloyd, MD  Cholecalciferol (VITAMIN D3) 1000 units CAPS once a day 11/07/14   [provider]  fluocinonide (LIDEX) 0.05 % external solution Apply 1 application topically daily as needed. 11/05/19   [provider]  fluticasone (FLONASE) 50 MCG/ACT nasal spray Place 1 spray into both nostrils 2 (two) times daily as needed. 09/22/15   [provider]  hydrALAZINE (APRESOLINE) 10 MG tablet Take 1 tablet (10 mg  total) by mouth every 8 (eight) hours. 04/09/20   Glade Lloyd, MD  HYDROcodone-acetaminophen (NORCO/VICODIN) 5-325 MG tablet Take 1-2 tablets by mouth every 6 (six) hours as needed for severe pain. 04/09/20   Glade Lloyd, MD  icosapent Ethyl (VASCEPA) 1 g capsule Take 2 capsules by mouth 2 (two) times daily with a meal. 12/17/19   [provider]  isosorbide mononitrate (IMDUR) 30 MG 24 hr tablet Take 1 tablet (30 mg total) by mouth daily. 04/10/20   Glade Lloyd, MD  levothyroxine (SYNTHROID) 112 MCG tablet Take 112 mcg by mouth daily. 02/23/21   [provider]  levothyroxine  (SYNTHROID, LEVOTHROID) 125 MCG tablet Take 125 mcg by mouth daily before breakfast.    [provider]  Magnesium Oxide 250 MG TABS Take 1-2 tablets by mouth daily as needed.    [provider]  spironolactone (ALDACTONE) 25 MG tablet Take 1 tablet by mouth daily. 01/12/21   [provider]  telmisartan (MICARDIS) 40 MG tablet Take 40 mg by mouth daily. 03/06/21   [provider]  vitamin E 400 UNIT capsule once a day 01/06/15   [provider]    Allergies Other, Ace inhibitors, Amlodipine, Aspirin, Atorvastatin, Conj estrog-medroxyprogest ace, Prednisone, Raloxifene, and Valsartan  Family History  Problem Relation Age of Onset   Breast cancer Cousin     Social History Social History   Tobacco Use   Smoking status: Never   Smokeless tobacco: Never  Substance Use Topics   Alcohol use: No   Drug use: No    Review of Systems  Constitutional: No fever/chills Eyes: No visual changes. ENT: No sore throat. Cardiovascular: Denies chest pain. Respiratory: Positive for cough and shortness of breath. Gastrointestinal: No abdominal pain.  No nausea, no vomiting.  No diarrhea.  No constipation. Genitourinary: Negative for dysuria. Musculoskeletal: Negative for back pain. Skin: Negative for rash. Neurological: Negative for headaches, focal weakness or numbness.   ____________________________________________   PHYSICAL EXAM:  VITAL SIGNS: ED Triage Vitals  Enc Vitals Group     BP 04/01/21 2255 (!) 173/101     Pulse Rate 04/01/21 2255 89     Resp 04/01/21 2255 (!) 24     Temp 04/01/21 2255 98.2 F (36.8 C)     Temp Source 04/01/21 2255 Oral     SpO2 04/01/21 2255 95 %     Weight 04/01/21 2256 163 lb 2.3 oz (74 kg)     Height 04/01/21 2256 5' (1.524 m)     Head Circumference --      Peak Flow --      Pain Score 04/01/21 2256 0     Pain Loc --      Pain Edu? --      Excl. in GC? --     Constitutional: Alert and oriented.   Elderly appearing and in mild acute distress. Eyes: Conjunctivae are normal. PERRL. EOMI. Head: Atraumatic. Nose: No congestion/rhinnorhea. Mouth/Throat: Mucous membranes are mildly dry. Neck: No stridor.   Cardiovascular: Normal rate, regular rhythm. Grossly normal heart sounds.  Good peripheral circulation. Respiratory: Increased respiratory effort.  No retractions. Lungs with faint bibasilar rales. Gastrointestinal: Soft and nontender. No distention. No abdominal bruits. No CVA tenderness. Musculoskeletal: No lower extremity tenderness nor edema.  No joint effusions. Neurologic:  Normal speech and language. No gross focal neurologic deficits are appreciated.  Skin:  Skin is warm, dry and intact. No rash noted. Psychiatric: Mood and affect are normal. Speech and behavior are normal.  ____________________________________________   LABS (all labs ordered are listed, but only abnormal results are displayed)  Labs Reviewed  BRAIN NATRIURETIC PEPTIDE - Abnormal; Notable for the following components:      Result Value   B Natriuretic Peptide 559.2 (*)    All other components within normal limits  CBC WITH DIFFERENTIAL/PLATELET - Abnormal; Notable for the following components:   Hemoglobin 15.5 (*)    All other components within normal limits  COMPREHENSIVE METABOLIC PANEL - Abnormal; Notable for the following components:   Glucose, Bld 126 (*)    Creatinine, Ser 1.02 (*)    GFR, Estimated 54 (*)    All other components within normal limits  D-DIMER, QUANTITATIVE - Abnormal; Notable for the following components:   D-Dimer, Quant 1.21 (*)    All other components within normal limits  LACTATE DEHYDROGENASE - Abnormal; Notable for the following components:   LDH 193 (*)    All other components within normal limits  TROPONIN I (HIGH SENSITIVITY) - Abnormal; Notable for the following components:   Troponin I (High Sensitivity) 256 (*)    All other components within normal limits   TROPONIN I (HIGH SENSITIVITY) - Abnormal; Notable for the following components:   Troponin I (High Sensitivity) 514 (*)    All other components within normal limits  SARS CORONAVIRUS 2 (TAT 6-24 HRS)  URINE CULTURE  APTT  PROTIME-INR  CK  MAGNESIUM  HEPARIN LEVEL (UNFRACTIONATED)  PROCALCITONIN  PROCALCITONIN  URINALYSIS, COMPLETE (UACMP) WITH MICROSCOPIC  C-REACTIVE PROTEIN  FERRITIN  LEGIONELLA PNEUMOPHILA SEROGP 1 UR AG  LIPID PANEL   ____________________________________________  EKG  ED ECG REPORT I, Lovinia Snare J, the attending physician, personally viewed and interpreted this ECG.   Date: 04/01/2021  EKG Time: 2244  Rate: 82  Rhythm: normal EKG, normal sinus rhythm  Axis: Normal  Intervals:none  ST&T Change: ST depression  ____________________________________________  RADIOLOGY I, Daine Gunther J, personally viewed and evaluated these images (plain radiographs) as part of my medical decision making, as well as reviewing the written report by the radiologist.  ED MD interpretation: Volume overload  Official radiology report(s): DG Chest 2 View  Result Date: 04/01/2021 CLINICAL DATA:  Shortness of breath and cough. EXAM: CHEST - 2 VIEW COMPARISON:  December 21, 2020 FINDINGS: Mildly increased lung markings are noted. This is stable in appearance when compared to the prior study. There is no evidence of acute infiltrate, pleural effusion or pneumothorax. The cardiac silhouette is borderline in size. Moderate severity calcification of the aortic arch is noted. The visualized skeletal structures are unremarkable. IMPRESSION: Stable exam without active cardiopulmonary disease. Electronically Signed   By: Aram Candela M.D.   On: 04/01/2021 23:56    ____________________________________________   PROCEDURES  Procedure(s) performed (including Critical Care):  .1-3 Lead EKG Interpretation  Date/Time: 04/01/2021 11:53 PM Performed by: Irean Hong, MD Authorized by:  Irean Hong, MD     Interpretation: normal     ECG rate:  89   ECG rate assessment: normal     Rhythm: sinus rhythm     Ectopy: none     Conduction: normal   Comments:     Patient placed on cardiac monitor to evaluate for arrhythmias   CRITICAL CARE Performed by: Irean Hong   Total critical care time: 30 minutes  Critical care time was exclusive of separately billable procedures and treating other patients.  Critical care was necessary to treat or prevent imminent or life-threatening deterioration.  Critical care was time  spent personally by me on the following activities: development of treatment plan with patient and/or surrogate as well as nursing, discussions with consultants, evaluation of patient's response to treatment, examination of patient, obtaining history from patient or surrogate, ordering and performing treatments and interventions, ordering and review of laboratory studies, ordering and review of radiographic studies, pulse oximetry and re-evaluation of patient's condition.  ____________________________________________   INITIAL IMPRESSION / ASSESSMENT AND PLAN / ED COURSE  As part of my medical decision making, I reviewed the following data within the electronic MEDICAL RECORD NUMBER History obtained from family, Nursing notes reviewed and incorporated, Labs reviewed, EKG interpreted, Old chart reviewed, Radiograph reviewed, and Notes from prior ED visits     86 year old female presenting with a 1 week history of cough and shortness of breath. Differential includes, but is not limited to, viral syndrome, bronchitis including COPD exacerbation, pneumonia, reactive airway disease including asthma, CHF including exacerbation with or without pulmonary/interstitial edema, pneumothorax, ACS, thoracic trauma, and pulmonary embolism.   Will obtain cardiac panel, administer IV Lasix for fluid overload seen on chest x-ray.  Clinical Course as of 04/02/21 4098  Fri Apr 02, 2021  0038 Updated patient and family member of elevated troponin.  Will start heparin bolus with drip.  Will discuss with hospital services for admission. [JS]    Clinical Course User Index [JS] Irean Hong, MD     ____________________________________________   FINAL CLINICAL IMPRESSION(S) / ED DIAGNOSES  Final diagnoses:  Acute on chronic congestive heart failure, unspecified heart failure type [facility])  NSTEMI (non-ST elevated myocardial infarction) [facility])     ED Discharge Orders     None        Note:  This document was prepared using Dragon voice recognition software and may include unintentional dictation errors.    Irean Hong, MD 04/02/21 0230

## 2021-04-02 ENCOUNTER — Other Ambulatory Visit: Payer: Self-pay

## 2021-04-02 ENCOUNTER — Encounter
Admission: EM | Disposition: A | Discharge status: Home Health | Payer: Self-pay | Source: Home / Self Care | Attending: Student

## 2021-04-02 ENCOUNTER — Inpatient Hospital Stay
Admit: 2021-04-02 | Discharge: 2021-04-02 | Disposition: A | Discharge status: Home / Self Care | Payer: Medicare Other | Attending: Internal Medicine | Admitting: Internal Medicine

## 2021-04-02 ENCOUNTER — Encounter: Payer: Self-pay | Admitting: Internal Medicine

## 2021-04-02 ENCOUNTER — Inpatient Hospital Stay: Admit: 2021-04-02 | Payer: Medicare Other

## 2021-04-02 ENCOUNTER — Other Ambulatory Visit: Payer: Self-pay | Admitting: Cardiology

## 2021-04-02 DIAGNOSIS — G4733 Obstructive sleep apnea (adult) (pediatric): Secondary | ICD-10-CM | POA: Diagnosis present

## 2021-04-02 DIAGNOSIS — Z9049 Acquired absence of other specified parts of digestive tract: Secondary | ICD-10-CM | POA: Diagnosis not present

## 2021-04-02 DIAGNOSIS — R7989 Other specified abnormal findings of blood chemistry: Secondary | ICD-10-CM

## 2021-04-02 DIAGNOSIS — E669 Obesity, unspecified: Secondary | ICD-10-CM | POA: Diagnosis present

## 2021-04-02 DIAGNOSIS — I214 Non-ST elevation (NSTEMI) myocardial infarction: Principal | ICD-10-CM

## 2021-04-02 DIAGNOSIS — M109 Gout, unspecified: Secondary | ICD-10-CM | POA: Diagnosis present

## 2021-04-02 DIAGNOSIS — I10 Essential (primary) hypertension: Secondary | ICD-10-CM

## 2021-04-02 DIAGNOSIS — Z79890 Hormone replacement therapy (postmenopausal): Secondary | ICD-10-CM | POA: Diagnosis not present

## 2021-04-02 DIAGNOSIS — I509 Heart failure, unspecified: Secondary | ICD-10-CM

## 2021-04-02 DIAGNOSIS — Z6831 Body mass index (BMI) 31.0-31.9, adult: Secondary | ICD-10-CM | POA: Diagnosis not present

## 2021-04-02 DIAGNOSIS — R778 Other specified abnormalities of plasma proteins: Secondary | ICD-10-CM

## 2021-04-02 DIAGNOSIS — Z28310 Unvaccinated for covid-19: Secondary | ICD-10-CM | POA: Diagnosis not present

## 2021-04-02 DIAGNOSIS — M069 Rheumatoid arthritis, unspecified: Secondary | ICD-10-CM | POA: Diagnosis present

## 2021-04-02 DIAGNOSIS — I110 Hypertensive heart disease with heart failure: Secondary | ICD-10-CM | POA: Diagnosis present

## 2021-04-02 DIAGNOSIS — Z886 Allergy status to analgesic agent status: Secondary | ICD-10-CM | POA: Diagnosis not present

## 2021-04-02 DIAGNOSIS — J45901 Unspecified asthma with (acute) exacerbation: Secondary | ICD-10-CM | POA: Diagnosis not present

## 2021-04-02 DIAGNOSIS — Z96652 Presence of left artificial knee joint: Secondary | ICD-10-CM | POA: Diagnosis present

## 2021-04-02 DIAGNOSIS — K219 Gastro-esophageal reflux disease without esophagitis: Secondary | ICD-10-CM | POA: Diagnosis present

## 2021-04-02 DIAGNOSIS — I081 Rheumatic disorders of both mitral and tricuspid valves: Secondary | ICD-10-CM | POA: Diagnosis present

## 2021-04-02 DIAGNOSIS — I5022 Chronic systolic (congestive) heart failure: Secondary | ICD-10-CM | POA: Diagnosis present

## 2021-04-02 DIAGNOSIS — E039 Hypothyroidism, unspecified: Secondary | ICD-10-CM | POA: Diagnosis present

## 2021-04-02 DIAGNOSIS — Z79899 Other long term (current) drug therapy: Secondary | ICD-10-CM | POA: Diagnosis not present

## 2021-04-02 DIAGNOSIS — I5020 Unspecified systolic (congestive) heart failure: Secondary | ICD-10-CM

## 2021-04-02 DIAGNOSIS — Z20822 Contact with and (suspected) exposure to covid-19: Secondary | ICD-10-CM | POA: Diagnosis present

## 2021-04-02 DIAGNOSIS — R5381 Other malaise: Secondary | ICD-10-CM | POA: Diagnosis present

## 2021-04-02 DIAGNOSIS — J4521 Mild intermittent asthma with (acute) exacerbation: Secondary | ICD-10-CM | POA: Diagnosis present

## 2021-04-02 DIAGNOSIS — R0902 Hypoxemia: Secondary | ICD-10-CM | POA: Diagnosis present

## 2021-04-02 DIAGNOSIS — Z888 Allergy status to other drugs, medicaments and biological substances status: Secondary | ICD-10-CM | POA: Diagnosis not present

## 2021-04-02 DIAGNOSIS — E785 Hyperlipidemia, unspecified: Secondary | ICD-10-CM | POA: Diagnosis present

## 2021-04-02 DIAGNOSIS — I5023 Acute on chronic systolic (congestive) heart failure: Secondary | ICD-10-CM | POA: Diagnosis present

## 2021-04-02 HISTORY — PX: LEFT HEART CATH AND CORONARY ANGIOGRAPHY: CATH118249

## 2021-04-02 LAB — COMPREHENSIVE METABOLIC PANEL
ALT: 14 U/L (ref 0–44)
ALT: 15 U/L (ref 0–44)
AST: 20 U/L (ref 15–41)
AST: 21 U/L (ref 15–41)
Albumin: 3.6 g/dL (ref 3.5–5.0)
Albumin: 3.9 g/dL (ref 3.5–5.0)
Alkaline Phosphatase: 46 U/L (ref 38–126)
Alkaline Phosphatase: 44 U/L (ref 38–126)
Anion gap: 9 (ref 5–15)
Anion gap: 10 (ref 5–15)
BUN: 15 mg/dL (ref 8–23)
BUN: 16 mg/dL (ref 8–23)
CO2: 25 mmol/L (ref 22–32)
CO2: 28 mmol/L (ref 22–32)
Calcium: 9.6 mg/dL (ref 8.9–10.3)
Calcium: 9.6 mg/dL (ref 8.9–10.3)
Chloride: 105 mmol/L (ref 98–111)
Chloride: 101 mmol/L (ref 98–111)
Creatinine, Ser: 1.02 mg/dL — ABNORMAL HIGH (ref 0.44–1.00)
Creatinine, Ser: 0.84 mg/dL (ref 0.44–1.00)
GFR, Estimated: 54 mL/min — ABNORMAL LOW (ref >60)
GFR, Estimated: >60 mL/min (ref >60)
Glucose, Bld: 126 mg/dL — ABNORMAL HIGH (ref 70–99)
Glucose, Bld: 176 mg/dL — ABNORMAL HIGH (ref 70–99)
Potassium: 4.3 mmol/L (ref 3.5–5.1)
Potassium: 4.2 mmol/L (ref 3.5–5.1)
Sodium: 139 mmol/L (ref 135–145)
Sodium: 139 mmol/L (ref 135–145)
Total Bilirubin: 0.9 mg/dL (ref 0.3–1.2)
Total Bilirubin: 0.8 mg/dL (ref 0.3–1.2)
Total Protein: 7.1 g/dL (ref 6.5–8.1)
Total Protein: 7.7 g/dL (ref 6.5–8.1)

## 2021-04-02 LAB — MAGNESIUM
Magnesium: 1.9 mg/dL (ref 1.7–2.4)
Magnesium: 1.9 mg/dL (ref 1.7–2.4)

## 2021-04-02 LAB — CBC WITH DIFFERENTIAL/PLATELET
Abs Immature Granulocytes: 0.01 10*3/uL (ref 0.00–0.07)
Abs Immature Granulocytes: 0.02 10*3/uL (ref 0.00–0.07)
Basophils Absolute: 0 10*3/uL (ref 0.0–0.1)
Basophils Absolute: 0 10*3/uL (ref 0.0–0.1)
Basophils Relative: 0 %
Basophils Relative: 0 %
Eosinophils Absolute: 0.1 10*3/uL (ref 0.0–0.5)
Eosinophils Absolute: 0 10*3/uL (ref 0.0–0.5)
Eosinophils Relative: 1 %
Eosinophils Relative: 0 %
HCT: 44 % (ref 36.0–46.0)
HCT: 46.7 % — ABNORMAL HIGH (ref 36.0–46.0)
Hemoglobin: 15.5 g/dL — ABNORMAL HIGH (ref 12.0–15.0)
Hemoglobin: 16.3 g/dL — ABNORMAL HIGH (ref 12.0–15.0)
Immature Granulocytes: 0 %
Immature Granulocytes: 0 %
Lymphocytes Relative: 20 %
Lymphocytes Relative: 11 %
Lymphs Abs: 1.2 10*3/uL (ref 0.7–4.0)
Lymphs Abs: 0.6 10*3/uL — ABNORMAL LOW (ref 0.7–4.0)
MCH: 30.4 pg (ref 26.0–34.0)
MCH: 30 pg (ref 26.0–34.0)
MCHC: 35.2 g/dL (ref 30.0–36.0)
MCHC: 34.9 g/dL (ref 30.0–36.0)
MCV: 86.3 fL (ref 80.0–100.0)
MCV: 85.8 fL (ref 80.0–100.0)
Monocytes Absolute: 0.4 10*3/uL (ref 0.1–1.0)
Monocytes Absolute: 0.1 10*3/uL (ref 0.1–1.0)
Monocytes Relative: 6 %
Monocytes Relative: 2 %
Neutro Abs: 4.5 10*3/uL (ref 1.7–7.7)
Neutro Abs: 4.9 10*3/uL (ref 1.7–7.7)
Neutrophils Relative %: 73 %
Neutrophils Relative %: 87 %
Platelets: 214 10*3/uL (ref 150–400)
Platelets: 214 10*3/uL (ref 150–400)
RBC: 5.1 MIL/uL (ref 3.87–5.11)
RBC: 5.44 MIL/uL — ABNORMAL HIGH (ref 3.87–5.11)
RDW: 13.4 % (ref 11.5–15.5)
RDW: 13.4 % (ref 11.5–15.5)
Smear Review: NORMAL
WBC: 6.2 10*3/uL (ref 4.0–10.5)
WBC: 5.6 10*3/uL (ref 4.0–10.5)
nRBC: 0 % (ref 0.0–0.2)
nRBC: 0 % (ref 0.0–0.2)

## 2021-04-02 LAB — URINALYSIS, COMPLETE (UACMP) WITH MICROSCOPIC
Bacteria, UA: NONE SEEN
Bilirubin Urine: NEGATIVE
Glucose, UA: NEGATIVE mg/dL
Hgb urine dipstick: NEGATIVE
Ketones, ur: NEGATIVE mg/dL
Leukocytes,Ua: NEGATIVE
Nitrite: NEGATIVE
Protein, ur: NEGATIVE mg/dL
Specific Gravity, Urine: 1.004 — ABNORMAL LOW (ref 1.005–1.030)
WBC, UA: NONE SEEN WBC/hpf (ref 0–5)
pH: 5 (ref 5.0–8.0)

## 2021-04-02 LAB — HEPARIN LEVEL (UNFRACTIONATED): Heparin Unfractionated: <0.1 IU/mL — ABNORMAL LOW (ref 0.30–0.70)

## 2021-04-02 LAB — PHOSPHORUS: Phosphorus: 3.3 mg/dL (ref 2.5–4.6)

## 2021-04-02 LAB — LIPID PANEL
Cholesterol: 170 mg/dL (ref 0–200)
HDL: 46 mg/dL (ref >40)
LDL Cholesterol: 112 mg/dL — ABNORMAL HIGH (ref 0–99)
Total CHOL/HDL Ratio: 3.7 RATIO
Triglycerides: 60 mg/dL (ref <150)
VLDL: 12 mg/dL (ref 0–40)

## 2021-04-02 LAB — ECHOCARDIOGRAM COMPLETE
Height: 60 in
S' Lateral: 4.15 cm
Weight: 2544 oz

## 2021-04-02 LAB — TROPONIN I (HIGH SENSITIVITY)
Troponin I (High Sensitivity): 256 ng/L (ref <18)
Troponin I (High Sensitivity): 446 ng/L (ref <18)
Troponin I (High Sensitivity): 469 ng/L (ref <18)
Troponin I (High Sensitivity): 514 ng/L (ref <18)

## 2021-04-02 LAB — PROTIME-INR
INR: 1 (ref 0.8–1.2)
Prothrombin Time: 13.1 seconds (ref 11.4–15.2)

## 2021-04-02 LAB — APTT: aPTT: 29 seconds (ref 24–36)

## 2021-04-02 LAB — D-DIMER, QUANTITATIVE: D-Dimer, Quant: 1.21 ug/mL-FEU — ABNORMAL HIGH (ref 0.00–0.50)

## 2021-04-02 LAB — TSH: TSH: 0.665 u[IU]/mL (ref 0.350–4.500)

## 2021-04-02 LAB — PROCALCITONIN
Procalcitonin: <0.1 ng/mL
Procalcitonin: <0.1 ng/mL

## 2021-04-02 LAB — CK: Total CK: 113 U/L (ref 38–234)

## 2021-04-02 LAB — FERRITIN: Ferritin: 302 ng/mL (ref 11–307)

## 2021-04-02 LAB — SARS CORONAVIRUS 2 (TAT 6-24 HRS): SARS Coronavirus 2: NEGATIVE

## 2021-04-02 LAB — LACTATE DEHYDROGENASE: LDH: 193 U/L — ABNORMAL HIGH (ref 98–192)

## 2021-04-02 LAB — C-REACTIVE PROTEIN: CRP: 0.8 mg/dL (ref <1.0)

## 2021-04-02 SURGERY — LEFT HEART CATH AND CORONARY ANGIOGRAPHY
Anesthesia: Moderate Sedation

## 2021-04-02 MED ORDER — SODIUM CHLORIDE 0.9% FLUSH
3.0000 mL | Freq: Two times a day (BID) | INTRAVENOUS | Status: DC
  Administered 2021-04-02 – 2021-04-03 (×3): 3 mL via INTRAVENOUS

## 2021-04-02 MED ORDER — VERAPAMIL HCL 2.5 MG/ML IV SOLN
INTRAVENOUS | Status: DC | PRN
Start: 2021-04-02 — End: 2021-04-02
  Administered 2021-04-02: 2.5 mg via INTRAVENOUS

## 2021-04-02 MED ORDER — CARVEDILOL 12.5 MG PO TABS
12.5000 mg | ORAL_TABLET | Freq: Two times a day (BID) | ORAL | Status: DC
  Administered 2021-04-02: 12.5 mg via ORAL
  Filled 2021-04-02: qty 1

## 2021-04-02 MED ORDER — ACETAMINOPHEN 325 MG PO TABS: 650.0000 mg | ORAL_TABLET | Freq: Four times a day (QID) | ORAL | Status: DC | PRN

## 2021-04-02 MED ORDER — FUROSEMIDE 10 MG/ML IJ SOLN
40.0000 mg | Freq: Every day | INTRAMUSCULAR | Status: DC
  Administered 2021-04-02 – 2021-04-04 (×3): 40 mg via INTRAVENOUS
  Filled 2021-04-02 (×3): qty 4

## 2021-04-02 MED ORDER — ALBUTEROL SULFATE (2.5 MG/3ML) 0.083% IN NEBU: 2.5000 mg | INHALATION_SOLUTION | RESPIRATORY_TRACT | Status: DC | PRN

## 2021-04-02 MED ORDER — ALBUTEROL SULFATE (2.5 MG/3ML) 0.083% IN NEBU
2.5000 mg | INHALATION_SOLUTION | Freq: Once | RESPIRATORY_TRACT | Status: AC
  Administered 2021-04-02: 2.5 mg via RESPIRATORY_TRACT

## 2021-04-02 MED ORDER — ALBUTEROL SULFATE HFA 108 (90 BASE) MCG/ACT IN AERS: 2.0000 | INHALATION_SPRAY | Freq: Four times a day (QID) | RESPIRATORY_TRACT | Status: DC | PRN

## 2021-04-02 MED ORDER — SODIUM CHLORIDE 0.9% FLUSH
3.0000 mL | Freq: Two times a day (BID) | INTRAVENOUS | Status: DC
  Administered 2021-04-02 – 2021-04-04 (×4): 3 mL via INTRAVENOUS

## 2021-04-02 MED ORDER — HEPARIN (PORCINE) 25000 UT/250ML-% IV SOLN
750.0000 [IU]/h | INTRAVENOUS | Status: DC
  Administered 2021-04-02: 750 [IU]/h via INTRAVENOUS
  Filled 2021-04-02: qty 250

## 2021-04-02 MED ORDER — ONDANSETRON HCL 4 MG/2ML IJ SOLN
4.0000 mg | Freq: Four times a day (QID) | INTRAMUSCULAR | Status: DC | PRN
Start: 2021-04-02 — End: 2021-04-04

## 2021-04-02 MED ORDER — HYDROCODONE-ACETAMINOPHEN 5-325 MG PO TABS: 1.0000 | ORAL_TABLET | ORAL | Status: DC | PRN

## 2021-04-02 MED ORDER — SODIUM CHLORIDE 0.9 % IV SOLN: 250.0000 mL | INTRAVENOUS | Status: DC | PRN

## 2021-04-02 MED ORDER — LIDOCAINE HCL (PF) 1 % IJ SOLN
INTRAMUSCULAR | Status: AC
  Filled 2021-04-02: qty 30

## 2021-04-02 MED ORDER — HEPARIN (PORCINE) IN NACL 1000-0.9 UT/500ML-% IV SOLN
INTRAVENOUS | Status: AC
  Filled 2021-04-02: qty 1000

## 2021-04-02 MED ORDER — ASPIRIN 81 MG PO CHEW: 81.0000 mg | CHEWABLE_TABLET | ORAL | Status: DC

## 2021-04-02 MED ORDER — SODIUM CHLORIDE 0.9 % WEIGHT BASED INFUSION: 3.0000 mL/kg/h | INTRAVENOUS | Status: DC

## 2021-04-02 MED ORDER — HYDRALAZINE HCL 20 MG/ML IJ SOLN: 10.0000 mg | INTRAMUSCULAR | Status: AC | PRN

## 2021-04-02 MED ORDER — SODIUM CHLORIDE 0.9 % WEIGHT BASED INFUSION
1.0000 mL/kg/h | INTRAVENOUS | Status: AC
  Administered 2021-04-02: 1 mL/kg/h via INTRAVENOUS

## 2021-04-02 MED ORDER — VERAPAMIL HCL 2.5 MG/ML IV SOLN
INTRAVENOUS | Status: AC
  Filled 2021-04-02: qty 2

## 2021-04-02 MED ORDER — HEPARIN (PORCINE) 25000 UT/250ML-% IV SOLN: 14.0000 [IU]/kg/h | INTRAVENOUS | Status: DC

## 2021-04-02 MED ORDER — ACETAMINOPHEN 650 MG RE SUPP: 650.0000 mg | Freq: Four times a day (QID) | RECTAL | Status: DC | PRN

## 2021-04-02 MED ORDER — HEPARIN SODIUM (PORCINE) 1000 UNIT/ML IJ SOLN
INTRAMUSCULAR | Status: DC | PRN
  Administered 2021-04-02: 3500 [IU] via INTRAVENOUS

## 2021-04-02 MED ORDER — IOHEXOL 300 MG/ML  SOLN
INTRAMUSCULAR | Status: DC | PRN
  Administered 2021-04-02: 52 mL

## 2021-04-02 MED ORDER — HEPARIN SODIUM (PORCINE) 1000 UNIT/ML IJ SOLN
INTRAMUSCULAR | Status: AC
  Filled 2021-04-02: qty 1

## 2021-04-02 MED ORDER — SODIUM CHLORIDE 0.9% FLUSH: 3.0000 mL | INTRAVENOUS | Status: DC | PRN

## 2021-04-02 MED ORDER — FENTANYL CITRATE (PF) 100 MCG/2ML IJ SOLN
INTRAMUSCULAR | Status: AC
  Filled 2021-04-02: qty 2

## 2021-04-02 MED ORDER — HEPARIN SODIUM (PORCINE) 5000 UNIT/ML IJ SOLN: 4000.0000 [IU] | Freq: Once | INTRAMUSCULAR | Status: DC

## 2021-04-02 MED ORDER — ENOXAPARIN SODIUM 40 MG/0.4ML IJ SOSY
40.0000 mg | PREFILLED_SYRINGE | INTRAMUSCULAR | Status: DC
  Administered 2021-04-02 – 2021-04-03 (×2): 40 mg via SUBCUTANEOUS
  Filled 2021-04-02 (×2): qty 0.4

## 2021-04-02 MED ORDER — SPIRONOLACTONE 25 MG PO TABS
25.0000 mg | ORAL_TABLET | Freq: Every day | ORAL | Status: DC
  Administered 2021-04-03 – 2021-04-04 (×2): 25 mg via ORAL
  Filled 2021-04-02 (×3): qty 1

## 2021-04-02 MED ORDER — HEPARIN (PORCINE) IN NACL 1000-0.9 UT/500ML-% IV SOLN
INTRAVENOUS | Status: DC | PRN
  Administered 2021-04-02 (×2): 500 mL

## 2021-04-02 MED ORDER — ACETAMINOPHEN 325 MG PO TABS: 650.0000 mg | ORAL_TABLET | ORAL | Status: DC | PRN

## 2021-04-02 MED ORDER — MIDAZOLAM HCL 2 MG/2ML IJ SOLN
INTRAMUSCULAR | Status: DC | PRN
  Administered 2021-04-02: 0.5 mg via INTRAVENOUS

## 2021-04-02 MED ORDER — ISOSORBIDE MONONITRATE ER 30 MG PO TB24: 30.0000 mg | ORAL_TABLET | Freq: Every day | ORAL | Status: DC

## 2021-04-02 MED ORDER — LIDOCAINE HCL (PF) 1 % IJ SOLN
INTRAMUSCULAR | Status: DC | PRN
  Administered 2021-04-02: 2 mL

## 2021-04-02 MED ORDER — HEPARIN BOLUS VIA INFUSION
3600.0000 [IU] | Freq: Once | INTRAVENOUS | Status: AC
  Administered 2021-04-02: 3600 [IU] via INTRAVENOUS
  Filled 2021-04-02: qty 3600

## 2021-04-02 MED ORDER — SODIUM CHLORIDE 0.9 % WEIGHT BASED INFUSION: 1.0000 mL/kg/h | INTRAVENOUS | Status: DC

## 2021-04-02 MED ORDER — LABETALOL HCL 5 MG/ML IV SOLN: 10.0000 mg | INTRAVENOUS | Status: AC | PRN

## 2021-04-02 MED ORDER — MIDAZOLAM HCL 2 MG/2ML IJ SOLN
INTRAMUSCULAR | Status: AC
  Filled 2021-04-02: qty 2

## 2021-04-02 MED ORDER — LEVOTHYROXINE SODIUM 112 MCG PO TABS
112.0000 ug | ORAL_TABLET | Freq: Every day | ORAL | Status: DC
  Administered 2021-04-03 – 2021-04-04 (×2): 112 ug via ORAL
  Filled 2021-04-02 (×2): qty 1

## 2021-04-02 SURGICAL SUPPLY — 12 items
CATH INFINITI 5 FR JL3.5 (CATHETERS) ×2 IMPLANT
CATH INFINITI JR4 5F (CATHETERS) ×2 IMPLANT
DEVICE RAD TR BAND REGULAR (VASCULAR PRODUCTS) ×2 IMPLANT
DRAPE BRACHIAL (DRAPES) ×2 IMPLANT
GLIDESHEATH SLEND SS 6F .021 (SHEATH) ×2 IMPLANT
GUIDEWIRE INQWIRE 1.5J.035X260 (WIRE) ×1 IMPLANT
INQWIRE 1.5J .035X260CM (WIRE) ×2
KIT SYRINGE INJ CVI SPIKEX1 (MISCELLANEOUS) ×2 IMPLANT
PACK CARDIAC CATH (CUSTOM PROCEDURE TRAY) ×2 IMPLANT
PROTECTION STATION PRESSURIZED (MISCELLANEOUS) ×2
SET ATX SIMPLICITY (MISCELLANEOUS) ×2 IMPLANT
STATION PROTECTION PRESSURIZED (MISCELLANEOUS) ×1 IMPLANT

## 2021-04-02 NOTE — ED Notes (Signed)
Pt's bed linens changed at this time. 

## 2021-04-02 NOTE — Progress Notes (Signed)
PT Cancellation Note  Patient Details Name: EVOLA HOLLIS MRN: [id number] DOB: [birth year]   Cancelled Treatment:    Reason Eval/Treat Not Completed: Other (comment).  Pt off the floor at this time, PT to attempt as able.   Olga Coaster PT, DPT 12:58 PM,04/02/21

## 2021-04-02 NOTE — Progress Notes (Signed)
*  PRELIMINARY RESULTS* Echocardiogram 2D Echocardiogram has been performed.  Cristela Blue 04/02/2021, 1:19 PM

## 2021-04-02 NOTE — Progress Notes (Signed)
OT Cancellation Note  Patient Details Name: Andrea Phillips MRN: [id number] DOB: 11/09/1934   Cancelled Treatment:    Reason Eval/Treat Not Completed: Patient at procedure or test/ unavailable. Pt currently off the floor. Pt also with elevated BP of 193/104. OT to hold and re-attempt when pt is next available.   Jackquline Denmark, MS, OTR/L , CBIS ascom [phone]  04/02/21, 11:31 AM

## 2021-04-02 NOTE — Consult Note (Signed)
[facility] Cardiology Consultation Note  Patient ID: Andrea Phillips, MRN: [id number], DOB/AGE: 1935/02/03 86 y.o. Admit date: 04/01/2021   Date of Consult: 04/02/2021 Primary Physician: Margaretann Loveless, MD Primary Cardiologist: Gwen Pounds  Chief Complaint:  Chief Complaint  Patient presents with   Shortness of Breath   Reason for Consult:  Shortness of breath chest pain  HPI: 86 y.o. female with known coronary artery atherosclerosis and chronic systolic dysfunction congestive heart failure with hypertension hyperlipidemia previously on appropriate medication management who has had severe progression of acute shortness of breath when doing minor activities.  The shortness of breath excelled toward with rest and she needed to be seen in the emergency room.  At that time the patient did have an elevated troponin now peaked at 514 a BNP of 559 and a chest x-ray which appeared normal.  Echocardiogram in recent past has shown global LV systolic dysfunction with ejection fraction of 35% with mild to moderate mitral and tricuspid regurgitation.  EKG has shown normal sinus rhythm left atrial enlargement left interventricular conduction defect.  In addition to this the patient did have some diuresis with slight improvements of shortness of breath but significant concerns of the the significance of the elevated troponin possibly from non-ST elevation myocardial infarction.  Patient currently is hemodynamically stable  Past Medical History:  Diagnosis Date   Arthritis    ra   Asthma    Dysrhythmia    Edema extremities    GERD (gastroesophageal reflux disease)    Gout    Headache    History of hiatal hernia    Hypertension    Hypothyroidism    Shortness of breath dyspnea    Sleep apnea       Surgical History:  Past Surgical History:  Procedure Laterality Date   APPENDECTOMY     BACK SURGERY     CARDIAC CATHETERIZATION     CATARACT EXTRACTION W/PHACO Left 03/09/2015   Procedure:  CATARACT EXTRACTION PHACO AND INTRAOCULAR LENS PLACEMENT (IOC);  Surgeon: Sallee Lange, MD;  Location: [facility] ORS;  Service: Ophthalmology;  Laterality: Left;  [place] 01:31 AP% 26.1 CDE 43.72   CHOLECYSTECTOMY     EYE SURGERY     retina   JOINT REPLACEMENT     left knee     Home Meds: Prior to Admission medications   Medication Sig Start Date End Date Taking? Authorizing Provider  acetaminophen (TYLENOL) 325 MG tablet Take by mouth every 6 (six) hours as needed for moderate pain.    [provider]  albuterol (VENTOLIN HFA) 108 (90 Base) MCG/ACT inhaler Inhale 2 puffs into the lungs every 6 (six) hours as needed.    [provider]  carvedilol (COREG) 12.5 MG tablet Take 12.5 mg by mouth 2 (two) times daily. 01/12/21   [provider]  carvedilol (COREG) 6.25 MG tablet Take 1 tablet (6.25 mg total) by mouth 2 (two) times daily with a meal. 04/09/20   Glade Lloyd, MD  chlorthalidone (HYGROTON) 25 MG tablet Take 1 tablet (25 mg total) by mouth daily. 04/10/20   Glade Lloyd, MD  Cholecalciferol (VITAMIN D3) 1000 units CAPS once a day 11/07/14   [provider]  fluocinonide (LIDEX) 0.05 % external solution Apply 1 application topically daily as needed. 11/05/19   [provider]  fluticasone (FLONASE) 50 MCG/ACT nasal spray Place 1 spray into both nostrils 2 (two) times daily as needed. 09/22/15   [provider]  hydrALAZINE (APRESOLINE) 10 MG tablet Take  1 tablet (10 mg total) by mouth every 8 (eight) hours. 04/09/20   Glade Lloyd, MD  HYDROcodone-acetaminophen (NORCO/VICODIN) 5-325 MG tablet Take 1-2 tablets by mouth every 6 (six) hours as needed for severe pain. 04/09/20   Glade Lloyd, MD  icosapent Ethyl (VASCEPA) 1 g capsule Take 2 capsules by mouth 2 (two) times daily with a meal. 12/17/19   [provider]  isosorbide mononitrate (IMDUR) 30 MG 24 hr tablet Take 1 tablet (30 mg total) by mouth daily. 04/10/20   Glade Lloyd, MD  levothyroxine (SYNTHROID) 112 MCG tablet Take 112 mcg by mouth daily. 02/23/21   [provider]  levothyroxine (SYNTHROID, LEVOTHROID) 125 MCG tablet Take 125 mcg by mouth daily before breakfast.    [provider]  Magnesium Oxide 250 MG TABS Take 1-2 tablets by mouth daily as needed.    [provider]  spironolactone (ALDACTONE) 25 MG tablet Take 1 tablet by mouth daily. 01/12/21   [provider]  telmisartan (MICARDIS) 40 MG tablet Take 40 mg by mouth daily. 03/06/21   [provider]  vitamin E 400 UNIT capsule once a day 01/06/15   [provider]    Inpatient Medications:   carvedilol  12.5 mg Oral BID   furosemide  40 mg Intravenous Daily   isosorbide mononitrate  30 mg Oral Daily   [START ON 04/03/2021] levothyroxine  112 mcg Oral Daily   sodium chloride flush  3 mL Intravenous Q12H   spironolactone  25 mg Oral Daily    sodium chloride     heparin 750 Units/hr (04/02/21 0105)    Allergies:  Allergies  Allergen Reactions   Other Hives    Yellow dye 6 FOOD COLOR YELLOW POWDER    Ace Inhibitors Cough   Amlodipine Itching   Aspirin    Atorvastatin Other (See Comments)   Conj Estrog-Medroxyprogest Ace     PREMPRO 0.3-1.5 MG ORAL TABLET   Prednisone    Raloxifene     EVISTA 60 MG ORAL TABLET   Valsartan Itching    Social History   Socioeconomic History   Marital status: Widowed    Spouse name: Not on file   Number of children: Not on file   Years of education: Not on file   Highest education level: Not on file  Occupational History   Not on file  Tobacco Use   Smoking status: Never   Smokeless tobacco: Never  Substance and Sexual Activity   Alcohol use: No   Drug use: No   Sexual activity: Not on file  Other Topics Concern   Not on file  Social History Narrative   Not on file   Social Determinants of Health   Financial Resource Strain: Not on file  Food Insecurity: Not on file   Transportation Needs: Not on file  Physical Activity: Not on file  Stress: Not on file  Social Connections: Not on file  Intimate Partner Violence: Not on file     Family History  Problem Relation Age of Onset   Breast cancer Cousin      Review of Systems Positive for shortness of breath Negative for: General:  chills, fever, night sweats or weight changes.  Cardiovascular: PND orthopnea syncope dizziness  Dermatological skin lesions rashes Respiratory: Cough congestion Urologic: Frequent urination urination at night and hematuria Abdominal: negative for nausea, vomiting, diarrhea, bright red blood per rectum, melena, or hematemesis Neurologic: negative for visual changes, and/or hearing changes  All other systems  reviewed and are otherwise negative except as noted above.  Labs: Recent Labs    04/02/21 0058  CKTOTAL 113   Lab Results  Component Value Date   WBC 5.6 04/02/2021   HGB 16.3 (H) 04/02/2021   HCT 46.7 (H) 04/02/2021   MCV 85.8 04/02/2021   PLT 214 04/02/2021    Recent Labs  Lab 04/02/21 0450  NA 139  K 4.2  CL 101  CO2 28  BUN 16  CREATININE 0.84  CALCIUM 9.6  PROT 7.7  BILITOT 0.8  ALKPHOS 44  ALT 15  AST 21  GLUCOSE 176*   Lab Results  Component Value Date   CHOL 170 04/02/2021   HDL 46 04/02/2021   LDLCALC 112 (H) 04/02/2021   TRIG 60 04/02/2021   Lab Results  Component Value Date   DDIMER 1.21 (H) 04/02/2021    Radiology/Studies:  DG Chest 2 View  Result Date: 04/01/2021 CLINICAL DATA:  Shortness of breath and cough. EXAM: CHEST - 2 VIEW COMPARISON:  December 21, 2020 FINDINGS: Mildly increased lung markings are noted. This is stable in appearance when compared to the prior study. There is no evidence of acute infiltrate, pleural effusion or pneumothorax. The cardiac silhouette is borderline in size. Moderate severity calcification of the aortic arch is noted. The visualized skeletal structures are unremarkable. IMPRESSION: Stable  exam without active cardiopulmonary disease. Electronically Signed   By: Aram Candela M.D.   On: 04/01/2021 23:56    EKG: Normal sinus rhythm with left atrial enlargement and intraventricular conduction defect  Weights: Filed Weights   04/01/21 2256  Weight: 74 kg     Physical Exam: Blood pressure (!) 161/63, pulse 63, temperature 98.2 F (36.8 C), temperature source Oral, resp. rate 14, height 5' (1.524 m), weight 74 kg, SpO2 96 %. Body mass index is 31.86 kg/m. General: Well developed, well nourished, in no acute distress. Head eyes ears nose throat: Normocephalic, atraumatic, sclera non-icteric, no xanthomas, nares are without discharge. No apparent thyromegaly and/or mass  Lungs: Normal respiratory effort.  no wheezes, basilar rales, no rhonchi.  Heart: RRR with normal S1 S2. no murmur gallop, no rub, PMI is normal size and placement, carotid upstroke normal without bruit, jugular venous pressure is normal Abdomen: Soft, non-tender, non-distended with normoactive bowel sounds. No hepatomegaly. No rebound/guarding. No obvious abdominal masses. Abdominal aorta is normal size without bruit Extremities: Trace edema. no cyanosis, no clubbing, no ulcers  Peripheral : 2+ bilateral upper extremity pulses, 2+ bilateral femoral pulses, 2+ bilateral dorsal pedal pulse Neuro: Alert and oriented. No facial asymmetry. No focal deficit. Moves all extremities spontaneously. Musculoskeletal: Normal muscle tone without kyphosis Psych:  Responds to questions appropriately with a normal affect.    Assessment: 86 year old female with hypertension hyperlipidemia and acute on chronic systolic dysfunction congestive heart failure with concerns of possible non-ST elevation myocardial infarction  Plan: 1.  Continue diuresis with Lasix for further treatment of acute on chronic systolic dysfunction congestive heart failure 2.  Continuation of beta-blocker ACE inhibitor for cardiomyopathy [place] congestive  heart failure 3.  Further evaluation possible non-ST elevation myocardial infarction by cardiac catheterization.  Patient understands the risk and benefits of cardiac catheterization.  This includes a possibility of death stroke heart attack infection bleeding or blood clot.  She is at low risk for conscious sedation  Signed, Lamar Blinks M.D. [facility] Cardiology 04/02/2021, 7:26 AM

## 2021-04-02 NOTE — H&P (Signed)
Andrea Phillips ZOX:[id number] DOB: [birth year] DOA: 04/01/2021     PCP: Margaretann Loveless, MD   Outpatient Specialists:   CARDS:   [facility]    Patient arrived to ER on 04/01/21 at 2237 Referred by Attending Irean Hong, MD   Patient coming from: home Lives   With family    Chief Complaint:   Chief Complaint  Patient presents with   Shortness of Breath    HPI: SHONDELL FABEL is a 86 y.o. female with medical history significant of  Chronic systolic CHF, OSA on CPAP, HTN, HLD GERD hypothyroidism, asthma  Presented with   worsening shortness of breath and cough starting about a week ago progressively getting worse.  When EMS arrived oxygen saturation was 92% on room air and patient was noted to have bilateral wheezing.  She was given 2 DuoNeb treatments and 125 mg of Solu-Medrol Reports few people in her family has been sick as well Have not had any fever no chest pain no nausea vomiting abdominal pain Family neg at home for COVID  Report she and her family has been having a cold like symptoms for 1 wk The SOB was sudden at 10 pm she put laundry in the dryer and by the time she went back upstairs she was very short of breath. No CP Low grade fever no leG edema Reports some diarrhea earlier this week now resolved No antibiotics use  No pleuritic CP She has been checking her weight and at baseline it is 162 lb but it went down to 159 for the past few days  She does have hx of Asthma but it is very mild  Infectious risk factors:  Reports shortness of breath, dry cough,     Has  NOt been vaccinated against COVID     Initial COVID TEST     in house  PCR testing  Pending  Lab Results  Component Value Date   SARSCOV2NAA NEGATIVE 04/07/2020     Regarding pertinent Chronic problems:     Hyperlipidemia - not on statins     HTN on imdur, coreg micardis, spironolcatone   chronic CHF  systolic/  - last echo march 2022 EF 35%    Hypothyroidism:  Lab Results   Component Value Date   TSH 0.396 04/07/2020   on synthroid   obesity-   BMI Readings from Last 1 Encounters:  04/01/21 31.86 kg/m      OSA -on nocturnal  CPAP,    While in ER: BNP elevated 559 First troponin elevated at 256 Patient started on heparin She was given a dose of lasix    ED Triage Vitals  Enc Vitals Group     BP 04/01/21 2255 (!) 173/101     Pulse Rate 04/01/21 2255 89     Resp 04/01/21 2255 (!) 24     Temp 04/01/21 2255 98.2 F (36.8 C)     Temp Source 04/01/21 2255 Oral     SpO2 04/01/21 2255 95 %     Weight 04/01/21 2256 163 lb 2.3 oz (74 kg)     Height 04/01/21 2256 5' (1.524 m)     Head Circumference --      Peak Flow --      Pain Score 04/01/21 2256 0     Pain Loc --      Pain Edu? --      Excl. in GC? --   TMAX(24)@     _________________________________________ Significant initial  Findings: Abnormal Labs Reviewed  BRAIN NATRIURETIC PEPTIDE - Abnormal; Notable for the following components:      Result Value   B Natriuretic Peptide 559.2 (*)    All other components within normal limits  CBC WITH DIFFERENTIAL/PLATELET - Abnormal; Notable for the following components:   Hemoglobin 15.5 (*)    All other components within normal limits  COMPREHENSIVE METABOLIC PANEL - Abnormal; Notable for the following components:   Glucose, Bld 126 (*)    Creatinine, Ser 1.02 (*)    GFR, Estimated 54 (*)    All other components within normal limits  TROPONIN I (HIGH SENSITIVITY) - Abnormal; Notable for the following components:   Troponin I (High Sensitivity) 256 (*)    All other components within normal limits   ____________________________________________ Ordered    CXR - NON acute   _____________________ Troponin 256 ECG: Ordered Personally reviewed by me showing: HR : 89 ST depressions lateral leads also seen on prior ECG [id number] _______   The recent clinical data is shown below. Vitals:   04/01/21 2255 04/01/21 2256 04/01/21 2330 04/02/21  0034  BP: (!) 173/101  (!) 172/75 (!) 152/72  Pulse: 89  87 75  Resp: (!) Temp: 98.2 F (36.8 C)     TempSrc: Oral     SpO2: 95%  92% 97%  Weight:  74 kg    Height:  5' (1.524 m)      WBC     Component Value Date/Time   WBC 6.2 04/01/2021 2246   LYMPHSABS 1.2 04/01/2021 2246   MONOABS 0.4 04/01/2021 2246   EOSABS 0.1 04/01/2021 2246   BASOSABS 0.0 04/01/2021 2246     Procalcitonin  Ordered   UA  not ordered     _______________________________________________ Hospitalist was called for admission for  NStemi  The following Work up has been ordered so far:  Orders Placed This Encounter  Procedures   1-3 Lead EKG Interpretation   SARS CORONAVIRUS 2 (TAT 6-24 HRS) Nasopharyngeal Nasopharyngeal Swab   DG Chest 2 View   Brain natriuretic peptide   CBC with Differential   Comprehensive metabolic panel   APTT   Protime-INR   Cardiac monitoring   Utilize spacer/aerochamber with mdi inhaler for COVID-19 positive patients or PUI for COVID-19   If O2 Sat <94% administer O2 at 2 liters/minute via nasal cannula   heparin per pharmacy consult   Consult to hospitalist  ALL PATIENTS BEING ADMITTED/HAVING PROCEDURES NEED COVID-19 SCREENING   Pulse oximetry, continuous   Adult wheeze protocol - initiate by RT   ED EKG      Following Medications were ordered in ER: Medications  heparin bolus via infusion 3,600 Units (has no administration in time range)  heparin ADULT infusion 100 units/mL (25000 units/239mL) (has no administration in time range)  furosemide (LASIX) injection 20 mg (20 mg Intravenous Given 04/02/21 0001)  chlorpheniramine-HYDROcodone (TUSSIONEX) 10-8 MG/5ML suspension 5 mL (5 mLs Oral Given 04/02/21 0000)  albuterol (PROVENTIL) (2.5 MG/3ML) 0.083% nebulizer solution 2.5 mg (2.5 mg Nebulization Given 04/02/21 0033)        Consult Orders  (From admission, onward)           Start     Ordered   04/02/21 0040  Consult to hospitalist  ALL PATIENTS  BEING ADMITTED/HAVING PROCEDURES NEED COVID-19 SCREENING  Once       Comments: ALL PATIENTS BEING ADMITTED/HAVING PROCEDURES NEED COVID-19 SCREENING  Provider:  (Not yet assigned)  Question  Answer Comment  Place call to: hospitalist   Reason for Consult Admit   Diagnosis/Clinical Info for Consult: 5901      04/02/21 0040              OTHER Significant initial  Findings:  labs showing:    Recent Labs  Lab 04/01/21 2246  NA 139  K 4.3  CO2 25  GLUCOSE 126*  BUN 15  CREATININE 1.02*  CALCIUM 9.6    Cr  Up from baseline see below Lab Results  Component Value Date   CREATININE 1.02 (H) 04/01/2021   CREATININE 0.67 12/21/2020   CREATININE 0.72 04/09/2020    Recent Labs  Lab 04/01/21 2246  AST 20  ALT 14  ALKPHOS 46  BILITOT 0.9  PROT 7.1  ALBUMIN 3.6   Lab Results  Component Value Date   CALCIUM 9.6 04/01/2021          Plt: Lab Results  Component Value Date   PLT 214 04/01/2021       COVID-19 Labs  No results for input(s): DDIMER, FERRITIN, LDH, CRP in the last 72 hours.  Lab Results  Component Value Date   SARSCOV2NAA NEGATIVE 04/07/2020           Recent Labs  Lab 04/01/21 2246  WBC 6.2  NEUTROABS 4.5  HGB 15.5*  HCT 44.0  MCV 86.3  PLT 214    HG/HCT  stable,       Component Value Date/Time   HGB 15.5 (H) 04/01/2021 2246   HCT 44.0 04/01/2021 2246   MCV 86.3 04/01/2021 2246       Cardiac Panel (last 3 results) No results for input(s): CKTOTAL, CKMB, TROPONINI, RELINDX in the last 72 hours.   BNP (last 3 results) Recent Labs    04/01/21 2246  BNP 559.2*      DM  labs:  HbA1C: No results for input(s): HGBA1C in the last 8760 hours.     CBG (last 3)  No results for input(s): GLUCAP in the last 72 hours.        Cultures: No results found for: SDES, SPECREQUEST, CULT, REPTSTATUS   Radiological Exams on Admission: DG Chest 2 View  Result Date: 04/01/2021 CLINICAL DATA:  Shortness of breath and cough. EXAM:  CHEST - 2 VIEW COMPARISON:  December 21, 2020 FINDINGS: Mildly increased lung markings are noted. This is stable in appearance when compared to the prior study. There is no evidence of acute infiltrate, pleural effusion or pneumothorax. The cardiac silhouette is borderline in size. Moderate severity calcification of the aortic arch is noted. The visualized skeletal structures are unremarkable. IMPRESSION: Stable exam without active cardiopulmonary disease. Electronically Signed   By: Aram Candela M.D.   On: 04/01/2021 23:56   _______________________________________________________________________________________________________ Latest  Blood pressure (!) 152/72, pulse 75, temperature 98.2 F (36.8 C), temperature source Oral, resp. rate 16, height 5' (1.524 m), weight 74 kg, SpO2 97 %.   Review of Systems:    Pertinent positives include:  chills, fatigue, diarrhea,non-productive cough, shortness of breath at rest. No dyspnea on exertion Constitutional:  No weight loss, night sweats, Fevers,   HEENT:  No headaches, Difficulty swallowing,Tooth/dental problems,Sore throat,  No sneezing, itching, ear ache, nasal congestion, post nasal drip,  Cardio-vascular:  No chest pain, Orthopnea, PND, anasarca, dizziness, palpitations.no Bilateral lower extremity swelling  GI:  No heartburn, indigestion, abdominal pain, nausea, vomiting, change in bowel habits, loss of appetite, melena, blood in stool, hematemesis Resp:  no , No  excess mucus, no productive cough, No  No coughing up of blood.No change in color of mucus.No wheezing. Skin:  no rash or lesions. No jaundice GU:  no dysuria, change in color of urine, no urgency or frequency. No straining to urinate.  No flank pain.  Musculoskeletal:  No joint pain or no joint swelling. No decreased range of motion. No back pain.  Psych:  No change in mood or affect. No depression or anxiety. No memory loss.  Neuro: no localizing neurological complaints,  no tingling, no weakness, no double vision, no gait abnormality, no slurred speech, no confusion  All systems reviewed and apart from HOPI all are negative _______________________________________________________________________________________________ Past Medical History:   Past Medical History:  Diagnosis Date   Arthritis    ra   Asthma    Dysrhythmia    Edema extremities    GERD (gastroesophageal reflux disease)    Gout    Headache    History of hiatal hernia    Hypertension    Hypothyroidism    Shortness of breath dyspnea    Sleep apnea       Past Surgical History:  Procedure Laterality Date   APPENDECTOMY     BACK SURGERY     CARDIAC CATHETERIZATION     CATARACT EXTRACTION W/PHACO Left 03/09/2015   Procedure: CATARACT EXTRACTION PHACO AND INTRAOCULAR LENS PLACEMENT (IOC);  Surgeon: Sallee Lange, MD;  Location: [facility] ORS;  Service: Ophthalmology;  Laterality: Left;  [place] 01:31 AP% 26.1 CDE 43.72   CHOLECYSTECTOMY     EYE SURGERY     retina   JOINT REPLACEMENT     left knee    Social History:  Ambulatory  independently       reports that she has never smoked. She has never used smokeless tobacco. She reports that she does not drink alcohol and does not use drugs.     Family History:    Family History  Problem Relation Age of Onset   Breast cancer Cousin    ______________________________________________________________________________________________ Allergies: Allergies  Allergen Reactions   Other Hives    Yellow dye 6 FOOD COLOR YELLOW POWDER    Ace Inhibitors Cough   Amlodipine Itching   Aspirin    Atorvastatin Other (See Comments)   Conj Estrog-Medroxyprogest Ace     PREMPRO 0.3-1.5 MG ORAL TABLET   Prednisone    Raloxifene     EVISTA 60 MG ORAL TABLET   Valsartan Itching     Prior to Admission medications   Medication Sig Start Date End Date Taking? Authorizing Provider  acetaminophen (TYLENOL) 325 MG tablet Take by mouth every 6  (six) hours as needed for moderate pain.    [provider]  albuterol (VENTOLIN HFA) 108 (90 Base) MCG/ACT inhaler Inhale 2 puffs into the lungs every 6 (six) hours as needed.    [provider]  carvedilol (COREG) 6.25 MG tablet Take 1 tablet (6.25 mg total) by mouth 2 (two) times daily with a meal. 04/09/20   Glade Lloyd, MD  chlorthalidone (HYGROTON) 25 MG tablet Take 1 tablet (25 mg total) by mouth daily. 04/10/20   Glade Lloyd, MD  Cholecalciferol (VITAMIN D3) 1000 units CAPS once a day 11/07/14   [provider]  fluocinonide (LIDEX) 0.05 % external solution Apply 1 application topically daily as needed. 11/05/19   [provider]  fluticasone (FLONASE) 50 MCG/ACT nasal spray Place 1 spray into both nostrils 2 (two) times daily as needed. 09/22/15   [provider]  hydrALAZINE (APRESOLINE) 10 MG tablet Take 1 tablet (10 mg total) by mouth every 8 (eight) hours. 04/09/20   Glade Lloyd, MD  HYDROcodone-acetaminophen (NORCO/VICODIN) 5-325 MG tablet Take 1-2 tablets by mouth every 6 (six) hours as needed for severe pain. 04/09/20   Glade Lloyd, MD  icosapent Ethyl (VASCEPA) 1 g capsule Take 2 capsules by mouth 2 (two) times daily with a meal. 12/17/19   [provider]  isosorbide mononitrate (IMDUR) 30 MG 24 hr tablet Take 1 tablet (30 mg total) by mouth daily. 04/10/20   Glade Lloyd, MD  levothyroxine (SYNTHROID, LEVOTHROID) 125 MCG tablet Take 125 mcg by mouth daily before breakfast.    [provider]  Magnesium Oxide 250 MG TABS Take 1-2 tablets by mouth daily as needed.    [provider]  vitamin E 400 UNIT capsule once a day 01/06/15   [provider]    ___________________________________________________________________________________________________ Physical Exam: Vitals with BMI 04/02/2021 04/01/2021 04/01/2021  Height - - 5\' 0"   Weight - - 163 lbs 2 oz  BMI - - 31.86  Systolic 152 172 -   Diastolic 72 75 -  Pulse 75 87 -     1. General:  in No cute distress   Chronically ill   -appearing 2. Psychological: Alert and Oriented 3. Head/ENT:   Moist   Mucous Membranes                          Head Non traumatic, neck supple                         Poor Dentition 4. SKIN: normal  Skin turgor,  Skin clean Dry and intact no rash 5. Heart: Regular rate and rhythm no  Murmur, no Rub or gallop 6. Lungs:   no wheezes some crackles   7. Abdomen: Soft,  non-tender, distended   obese  bowel sounds present 8. Lower extremities: no clubbing, cyanosis, no  edema 9. Neurologically Grossly intact, moving all 4 extremities equally   10. MSK: Normal range of motion    Chart has been reviewed  ______________________________________________________________________________________________  Assessment/Plan 86 y.o. female with medical history significant of  Chronic systolic CHF, OSA on CPAP, HTN, HLD GERD hypothyroidism, asthma  Admitted for NSTEMI  Present on Admission:  NSTEMI (non-ST elevated myocardial infarction) (HCC) - appreciate cardiology consult No Aspirin pt is allergic Continue to cycle troponin Echo in AM Keep NPO in case needs intervention Heparin drip Check TSH and lipid panel   Acute on chronic systolic CHF (congestive heart failure) (HCC) likely contributed to dyspnea.  Diuresis and monitor fluid status obtain echogram appreciate cardiology involvement patient states that her shortness of breath has gotten better when she received Lasix.  Follow weight and urine output   OSA (obstructive sleep apnea) - order CPAP   Essential hypertension -resume Coreg and Imdur  URI -COVID pending.  Noted slightly elevated D-dimer patient already heparinized.  History is not consistent with PE patient on heparin right now  History of asthma mild continue butyryl as needed   Hypothyroidism - - Check TSH continue home medications at current dose  Other plan as per  orders.  DVT prophylaxis:   heparin bolus via infusion 3,600 Units Start: 04/02/21 0100    Code Status:    Code Status: Prior FULL CODE   as per patient  I had personally discussed CODE STATUS with patient  Family Communication:   Family not at  Bedside    Disposition Plan:      To home once workup is complete and patient is stable   Following barriers for discharge:                            Electrolytes corrected                                                           Will need to be able to tolerate PO                            Will likely need home health, home O2, set up                           Will need consultants to evaluate patient prior to discharge                        Would benefit from PT/OT eval prior to DC  Ordered                                      Consults called:   sent msg to cardiology   Admission status:  ED Disposition     ED Disposition  Admit   Condition  --   Comment  Hospital Area: [facility] [[id number]]  Level of Care: Progressive Cardiac [[id number]]  Admit to Progressive based on following criteria: CARDIOVASCULAR & THORACIC of moderate stability with acute coronary syndrome symptoms/low risk myocardial infarction/hypertensive urgency/arrhythmias/heart failure potentially compromising stability and stable post cardiovascular intervention patients.  Covid Evaluation: Symptomatic Person Under Investigation (PUI)  Diagnosis: NSTEMI (non-ST elevated myocardial infarction) [facility]) [[id number]]  Admitting Physician: Therisa Doyne [[id number]]  Attending Physician: Therisa Doyne [[id number]]  Estimated length of stay: past midnight tomorrow  Certification:: I certify this patient will need inpatient services for at least 2 midnights            inpatient     I Expect 2 midnight stay secondary to severity of patient's current illness need for inpatient interventions justified by the following:     Severe  lab/radiological/exam abnormalities including:    Increased work of breathing, elevated tropinin and extensive comorbidities including:    CHF  COPD/asthma    That are currently affecting medical management.   I expect  patient to be hospitalized for 2 midnights requiring inpatient medical care.  Patient is at high risk for adverse outcome (such as loss of life or disability) if not treated.  Indication for inpatient stay as follows:       New or worsening hypoxia  Need for I   IV diuretics, iv HEPARIN    Level of care    progressive tele indefinitely please discontinue once patient no longer qualifies COVID-19 Labs    Lab Results  Component Value Date   SARSCOV2NAA NEGATIVE 04/07/2020     Precautions: admitted as PUI    No active isolations  If Covid PCR is negative  - please DC precautions -  PPE: Used by the provider:   N95  eye Goggles,  Gloves     Lorinda Copland 04/02/2021, 2:15 AM   [facility]     after 2 AM please page floor coverage PA If 7AM-7PM, please contact the day team taking care of the patient using [email or web address]   Patient was evaluated in the context of the global COVID-19 pandemic, which necessitated consideration that the patient might be at risk for infection with the SARS-CoV-2 virus that causes COVID-19. Institutional protocols and algorithms that pertain to the evaluation of patients at risk for COVID-19 are in a state of rapid change based on information released by regulatory bodies including the CDC and federal and state organizations. These policies and algorithms were followed during the patient's care.

## 2021-04-02 NOTE — Progress Notes (Signed)
PROGRESS NOTE  Andrea Phillips ZMO:[id number] DOB: [birth year]   PCP: Margaretann Loveless, MD  Patient is from: Home.  Lives with family.  DOA: 04/01/2021 LOS: 0  Chief complaints: Shortness of breath  Brief Narrative / Interim history: 86 year old F with PMH of systolic CHF, OSA on CPAP, asthma, HTN, hypothyroidism, HLD and GERD presenting with shortness of breath, productive cough and wheeze for about a week, and admitted for non-STEMI and acute on chronic systolic CHF.  Troponin elevated to 256 and trended up to 514.  BNP 560.  CXR without acute finding.  Of note, multiple family members with URI symptoms.  She is unvaccinated for COVID-19.   Subjective: Seen and examined this afternoon after she returned from left heart catheterization.  Reports improvement in her breathing.  Now some productive cough with whitish phlegm.  She denies hemoptysis.  Chest pain, edema, GI or UTI symptoms  Objective: Vitals:   04/02/21 1200 04/02/21 1250 04/02/21 1330 04/02/21 1400  BP: (!) 141/78 (!) 171/73 (!) 168/68 (!) 153/73  Pulse: (!) 55 (!) 50 (!) 57 (!) 59  Resp: 16 16 15 18   Temp:      TempSrc:      SpO2: 97% 97% 98% 97%  Weight:      Height:        Intake/Output Summary (Last 24 hours) at 04/02/2021 1500 Last data filed at 04/02/2021 0106 Gross per 24 hour  Intake --  Output 800 ml  Net -800 ml   Filed Weights   04/01/21 2256 04/02/21 0836  Weight: 74 kg 72.1 kg    Examination:  GENERAL: No apparent distress.  Nontoxic. HEENT: MMM.  Vision and hearing grossly intact.  NECK: Supple.  No apparent JVD.  RESP: 97% on 2 L.  No IWOB.  Rhonchi bilaterally. CVS:  RRR. Heart sounds normal.  ABD/GI/GU: BS+. Abd soft, NTND.  MSK/EXT:  Moves extremities. No apparent deformity. No edema.  SKIN: no apparent skin lesion or wound NEURO: Awake, alert and oriented appropriately.  No apparent focal neuro deficit. PSYCH: Calm. Normal affect.   Procedures:  6/10-LHC with normal coronaries and  LVEF of 35%.  Microbiology summarized: COVID-19 PCR pending.  Assessment & Plan: NSTEMI: Troponin 256>514> 446.  Twelve-lead EKG sinus rhythm with multiple PVCs but no acute ischemic finding.  LHC with normal coronaries.  Patient without chest pain. -Continue Coreg and Imdur. -CHF as below   Acute on chronic systolic CHF: TTE in 2021 with LVEF of 30 to 35%, moderately elevated PASP.  BNP elevated to 560 but no prior to compare to.  Started on IV Lasix. -Continue IV Lasix per cardiology -GDMT-Coreg, Imdur, Aldactone -Follow echocardiogram -Monitor fluid and respiratory status, renal functions.   OSA on CPAP -Continue nightly CPAP.   Essential hypertension -Cardiac meds as above. -Consider changing Coreg to metoprolol given history of asthma.   URI-could be contributing to patient's symptoms.  She is not vaccinated against COVID-19.  Mildly elevated D-dimer, LDH and BNP -Continue contact and airborne precautions pending COVID-19 PCR  Mildly elevated D-dimer: Low suspicion for VTE but some concern for COVID-19 infection  Mild intermittent asthma: Patient's presentation suggestive for some degree of asthma exacerbation but improved with diuretics -Continue breathing treatments.   Hypothyroidism -Continue home Synthroid.      Body mass index is 31.05 kg/m.         DVT prophylaxis:  enoxaparin (LOVENOX) injection 40 mg Start: 04/02/21 1545  Code Status: Full code Family Communication: Patient and/or RN.  Updated patient's niece at bedside. Level of care: Progressive Cardiac Status is: Inpatient  Remains inpatient appropriate because:Ongoing diagnostic testing needed not appropriate for outpatient work up, IV treatments appropriate due to intensity of illness or inability to take PO, and Inpatient level of care appropriate due to severity of illness  Dispo: The patient is from: Home              Anticipated d/c is to: Home              Patient currently is not  medically stable to d/c.   Difficult to place patient No       Consultants:  Cardiology   Sch Meds:  Scheduled Meds:  carvedilol  12.5 mg Oral BID   enoxaparin (LOVENOX) injection  40 mg Subcutaneous Q24H   furosemide  40 mg Intravenous Daily   isosorbide mononitrate  30 mg Oral Daily   [START ON 04/03/2021] levothyroxine  112 mcg Oral Daily   sodium chloride flush  3 mL Intravenous Q12H   sodium chloride flush  3 mL Intravenous Q12H   spironolactone  25 mg Oral Daily   Continuous Infusions:  sodium chloride     sodium chloride     PRN Meds:.sodium chloride, acetaminophen **OR** acetaminophen, acetaminophen, albuterol, hydrALAZINE, HYDROcodone-acetaminophen, labetalol, ondansetron (ZOFRAN) IV, sodium chloride flush  Antimicrobials: Anti-infectives (From admission, onward)    None        I have personally reviewed the following labs and images: CBC: Recent Labs  Lab 04/01/21 2246 04/02/21 0450  WBC 6.2 5.6  NEUTROABS 4.5 4.9  HGB 15.5* 16.3*  HCT 44.0 46.7*  MCV 86.3 85.8  PLT 214 214   BMP &GFR Recent Labs  Lab 04/01/21 2246 04/02/21 0058 04/02/21 0450  NA 139  --  139  K 4.3  --  4.2  CL 105  --  101  CO2 25  --  28  GLUCOSE 126*  --  176*  BUN 15  --  16  CREATININE 1.02*  --  0.84  CALCIUM 9.6  --  9.6  MG  --  1.9 1.9  PHOS  --   --  3.3   Estimated Creatinine Clearance: 44.2 mL/min (by C-G formula based on SCr of 0.84 mg/dL). Liver & Pancreas: Recent Labs  Lab 04/01/21 2246 04/02/21 0450  AST 20 21  ALT 14 15  ALKPHOS 46 44  BILITOT 0.9 0.8  PROT 7.1 7.7  ALBUMIN 3.6 3.9   No results for input(s): LIPASE, AMYLASE in the last 168 hours. No results for input(s): AMMONIA in the last 168 hours. Diabetic: No results for input(s): HGBA1C in the last 72 hours. No results for input(s): GLUCAP in the last 168 hours. Cardiac Enzymes: Recent Labs  Lab 04/02/21 0058  CKTOTAL 113   No results for input(s): PROBNP in the last 8760  hours. Coagulation Profile: Recent Labs  Lab 04/02/21 0058  INR 1.0   Thyroid Function Tests: Recent Labs    04/02/21 0450  TSH 0.665   Lipid Profile: Recent Labs    04/02/21 0450  CHOL 170  HDL 46  LDLCALC 112*  TRIG 60  CHOLHDL 3.7   Anemia Panel: Recent Labs    04/01/21 2246  FERRITIN 302   Urine analysis:    Component Value Date/Time   COLORURINE COLORLESS (A) 04/02/2021 0301   APPEARANCEUR CLEAR (A) 04/02/2021 0301   LABSPEC 1.004 (L) 04/02/2021 0301   PHURINE 5.0 04/02/2021 0301   GLUCOSEU NEGATIVE 04/02/2021  0301   HGBUR NEGATIVE 04/02/2021 0301   BILIRUBINUR NEGATIVE 04/02/2021 0301   KETONESUR NEGATIVE 04/02/2021 0301   PROTEINUR NEGATIVE 04/02/2021 0301   NITRITE NEGATIVE 04/02/2021 0301   LEUKOCYTESUR NEGATIVE 04/02/2021 0301   Sepsis Labs: Invalid input(s): PROCALCITONIN, LACTICIDVEN  Microbiology: No results found for this or any previous visit (from the past 240 hour(s)).  Radiology Studies: DG Chest 2 View  Result Date: 04/01/2021 CLINICAL DATA:  Shortness of breath and cough. EXAM: CHEST - 2 VIEW COMPARISON:  December 21, 2020 FINDINGS: Mildly increased lung markings are noted. This is stable in appearance when compared to the prior study. There is no evidence of acute infiltrate, pleural effusion or pneumothorax. The cardiac silhouette is borderline in size. Moderate severity calcification of the aortic arch is noted. The visualized skeletal structures are unremarkable. IMPRESSION: Stable exam without active cardiopulmonary disease. Electronically Signed   By: Aram Candela M.D.   On: 04/01/2021 23:56   CARDIAC CATHETERIZATION  Result Date: 04/02/2021 86 year old female with known chronic systolic dysfunction congestive heart failure on appropriate medication management having chest pain shortness of breath and elevated troponin Cardiac catheterization showing normal coronary arteries with 0% stenosis Echocardiogram showing global LV  systolic dysfunction ejection of 35% Plan Continue medication management for acute on chronic systolic dysfunction congestive heart failure No further cardiac intervention at this time Cardiac rehabilitation     Domenic Schoenberger T. Makinley Muscato [facility]  If 7PM-7AM, please contact night-coverage [email or web address] 04/02/2021, 3:00 PM

## 2021-04-02 NOTE — Progress Notes (Signed)
[facility] Encounter Note  Patient: Andrea Phillips / Admit Date: 04/01/2021 / Date of Encounter: 04/02/2021, 9:56 AM   Subjective: Patient feels at this time with less shortness of breath after some diet recess.  No evidence of anginal symptoms at this time  Cardiac catheterization showing normal coronary arteries without evidence of stenosis  Echocardiogram previously showing global LV systolic dysfunction with ejection fraction of 35%  Review of Systems: Positive for: Shortness of breath Negative for: Vision change, hearing change, syncope, dizziness, nausea, vomiting,diarrhea, bloody stool, stomach pain, cough, congestion, diaphoresis, urinary frequency, urinary pain,skin lesions, skin rashes Others previously listed  Objective: Telemetry: Normal sinus rhythm Physical Exam: Blood pressure (!) 193/104, pulse 80, temperature 97.7 F (36.5 C), temperature source Oral, resp. rate (!) 24, height 5' (1.524 m), weight 72.1 kg, SpO2 96 %. Body mass index is 31.05 kg/m. General: Well developed, well nourished, in no acute distress. Head: Normocephalic, atraumatic, sclera non-icteric, no xanthomas, nares are without discharge. Neck: No apparent masses Lungs: Normal respirations with no wheezes, no rhonchi, no rales , no crackles   Heart: Regular rate and rhythm, normal S1 S2, no murmur, no rub, no gallop, PMI is normal size and placement, carotid upstroke normal without bruit, jugular venous pressure normal Abdomen: Soft, non-tender, non-distended with normoactive bowel sounds. No hepatosplenomegaly. Abdominal aorta is normal size without bruit Extremities: No edema, no clubbing, no cyanosis, no ulcers,  Peripheral: 2+ radial, 2+ femoral, 2+ dorsal pedal pulses Neuro: Alert and oriented. Moves all extremities spontaneously. Psych:  Responds to questions appropriately with a normal affect.   Intake/Output Summary (Last 24 hours) at 04/02/2021 0956 Last data filed  at 04/02/2021 0106 Gross per 24 hour  Intake --  Output 800 ml  Net -800 ml    Inpatient Medications:   aspirin  81 mg Oral Pre-Cath   [MAR Hold] carvedilol  12.5 mg Oral BID   [MAR Hold] furosemide  40 mg Intravenous Daily   [MAR Hold] isosorbide mononitrate  30 mg Oral Daily   [MAR Hold] levothyroxine  112 mcg Oral Daily   [MAR Hold] sodium chloride flush  3 mL Intravenous Q12H   [MAR Hold] sodium chloride flush  3 mL Intravenous Q12H   [MAR Hold] spironolactone  25 mg Oral Daily   Infusions:   [MAR Hold] sodium chloride     sodium chloride     [START ON 04/03/2021] sodium chloride     Followed by   [START ON 04/03/2021] sodium chloride     heparin 750 Units/hr (04/02/21 0105)    Labs: Recent Labs    04/01/21 2246 04/02/21 0058 04/02/21 0450  NA 139  --  139  K 4.3  --  4.2  CL 105  --  101  CO2 25  --  28  GLUCOSE 126*  --  176*  BUN 15  --  16  CREATININE 1.02*  --  0.84  CALCIUM 9.6  --  9.6  MG  --  1.9 1.9  PHOS  --   --  3.3   Recent Labs    04/01/21 2246 04/02/21 0450  AST 20 21  ALT 14 15  ALKPHOS 46 44  BILITOT 0.9 0.8  PROT 7.1 7.7  ALBUMIN 3.6 3.9   Recent Labs    04/01/21 2246 04/02/21 0450  WBC 6.2 5.6  NEUTROABS 4.5 4.9  HGB 15.5* 16.3*  HCT 44.0 46.7*  MCV 86.3 85.8  PLT 214 214   Recent Labs  04/02/21 0058  CKTOTAL 113   Invalid input(s): POCBNP No results for input(s): HGBA1C in the last 72 hours.   Weights: Filed Weights   04/01/21 2256 04/02/21 0836  Weight: 74 kg 72.1 kg     Radiology/Studies:  DG Chest 2 View  Result Date: 04/01/2021 CLINICAL DATA:  Shortness of breath and cough. EXAM: CHEST - 2 VIEW COMPARISON:  December 21, 2020 FINDINGS: Mildly increased lung markings are noted. This is stable in appearance when compared to the prior study. There is no evidence of acute infiltrate, pleural effusion or pneumothorax. The cardiac silhouette is borderline in size. Moderate severity calcification of the aortic arch  is noted. The visualized skeletal structures are unremarkable. IMPRESSION: Stable exam without active cardiopulmonary disease. Electronically Signed   By: Aram Candela M.D.   On: 04/01/2021 23:56     Assessment and Recommendation  86 y.o. female with acute on chronic systolic dysfunction congestive heart failure with elevated troponin consistent with demand ischemia currently slowly improving with cardiac catheterization showing normal coronary arteries and no evidence of significant 1.  Diuresis with current medical regimen of intravenous furosemide for acute on chronic systolic dysfunction congestive heart failure 2.  Continuation of medication management including beta-blocker and ACE inhibitor for cardiomyopathy 3.  No further cardiac diagnostics necessary at this time  Signed, Arnoldo Hooker M.D. FACC

## 2021-04-02 NOTE — Progress Notes (Signed)
ANTICOAGULATION CONSULT NOTE - Initial Consult  Pharmacy Consult for Heparin  Indication: chest pain/ACS  Allergies  Allergen Reactions   Other Hives    Yellow dye 6 FOOD COLOR YELLOW POWDER    Ace Inhibitors Cough   Amlodipine Itching   Aspirin    Atorvastatin Other (See Comments)   Conj Estrog-Medroxyprogest Ace     PREMPRO 0.3-1.5 MG ORAL TABLET   Prednisone    Raloxifene     EVISTA 60 MG ORAL TABLET   Valsartan Itching    Patient Measurements: Height: 5' (152.4 cm) Weight: 74 kg (163 lb 2.3 oz) IBW/kg (Calculated) : 45.5 Heparin Dosing Weight: 62 kg   Vital Signs: Temp: 98.2 F (36.8 C) (06/09 2255) Temp Source: Oral (06/09 2255) BP: 152/72 (06/10 0034) Pulse Rate: 75 (06/10 0034)  Labs: Recent Labs    04/01/21 2246  HGB 15.5*  HCT 44.0  PLT 214  CREATININE 1.02*  TROPONINIHS 256*    Estimated Creatinine Clearance: 36.9 mL/min (A) (by C-G formula based on SCr of 1.02 mg/dL (H)).   Medical History: Past Medical History:  Diagnosis Date   Arthritis    ra   Asthma    Dysrhythmia    Edema extremities    GERD (gastroesophageal reflux disease)    Gout    Headache    History of hiatal hernia    Hypertension    Hypothyroidism    Shortness of breath dyspnea    Sleep apnea     Medications:  (Not in a hospital admission)   Assessment: Pharmacy consulted to dose heparin in this 86 year old female admitted with ACS/NSTEMI, no prior anticoag noted.    CrCl = 36.9 ml/min   Goal of Therapy:  Heparin level 0.3-0.7 units/ml Monitor platelets by anticoagulation protocol: Yes   Plan:  Give 3600 units bolus x 1 Start heparin infusion at 750 units/hr Check anti-Xa level in 8 hours and daily while on heparin Continue to monitor H&H and platelets  Evellyn Tuff D 04/02/2021,12:59 AM

## 2021-04-03 DIAGNOSIS — J45901 Unspecified asthma with (acute) exacerbation: Secondary | ICD-10-CM

## 2021-04-03 LAB — CBC
HCT: 44.7 % (ref 36.0–46.0)
Hemoglobin: 15.5 g/dL — ABNORMAL HIGH (ref 12.0–15.0)
MCH: 29.8 pg (ref 26.0–34.0)
MCHC: 34.7 g/dL (ref 30.0–36.0)
MCV: 85.8 fL (ref 80.0–100.0)
Platelets: 264 10*3/uL (ref 150–400)
RBC: 5.21 MIL/uL — ABNORMAL HIGH (ref 3.87–5.11)
RDW: 13.4 % (ref 11.5–15.5)
WBC: 7.3 10*3/uL (ref 4.0–10.5)
nRBC: 0 % (ref 0.0–0.2)

## 2021-04-03 LAB — RENAL FUNCTION PANEL
Albumin: 3.2 g/dL — ABNORMAL LOW (ref 3.5–5.0)
Anion gap: 8 (ref 5–15)
BUN: 20 mg/dL (ref 8–23)
CO2: 28 mmol/L (ref 22–32)
Calcium: 9.4 mg/dL (ref 8.9–10.3)
Chloride: 102 mmol/L (ref 98–111)
Creatinine, Ser: 0.83 mg/dL (ref 0.44–1.00)
GFR, Estimated: >60 mL/min (ref >60)
Glucose, Bld: 125 mg/dL — ABNORMAL HIGH (ref 70–99)
Phosphorus: 3.3 mg/dL (ref 2.5–4.6)
Potassium: 3.6 mmol/L (ref 3.5–5.1)
Sodium: 138 mmol/L (ref 135–145)

## 2021-04-03 LAB — URINE CULTURE

## 2021-04-03 LAB — MAGNESIUM: Magnesium: 2 mg/dL (ref 1.7–2.4)

## 2021-04-03 LAB — LEGIONELLA PNEUMOPHILA SEROGP 1 UR AG: L. pneumophila Serogp 1 Ur Ag: NEGATIVE

## 2021-04-03 LAB — PROCALCITONIN: Procalcitonin: <0.1 ng/mL

## 2021-04-03 MED ORDER — METHYLPREDNISOLONE SODIUM SUCC 125 MG IJ SOLR
60.0000 mg | Freq: Two times a day (BID) | INTRAMUSCULAR | Status: DC
  Administered 2021-04-03 – 2021-04-04 (×2): 60 mg via INTRAVENOUS
  Filled 2021-04-03 (×2): qty 2

## 2021-04-03 MED ORDER — ARFORMOTEROL TARTRATE 15 MCG/2ML IN NEBU
15.0000 ug | INHALATION_SOLUTION | Freq: Two times a day (BID) | RESPIRATORY_TRACT | Status: DC
  Administered 2021-04-03 – 2021-04-04 (×2): 15 ug via RESPIRATORY_TRACT
  Filled 2021-04-03 (×4): qty 2

## 2021-04-03 MED ORDER — PANTOPRAZOLE SODIUM 40 MG PO TBEC
40.0000 mg | DELAYED_RELEASE_TABLET | Freq: Every day | ORAL | Status: DC
  Administered 2021-04-03 – 2021-04-04 (×2): 40 mg via ORAL
  Filled 2021-04-03 (×2): qty 1

## 2021-04-03 MED ORDER — GUAIFENESIN-DM 100-10 MG/5ML PO SYRP
5.0000 mL | ORAL_SOLUTION | ORAL | Status: DC | PRN
  Administered 2021-04-03: 5 mL via ORAL
  Filled 2021-04-03: qty 5

## 2021-04-03 MED ORDER — METOPROLOL SUCCINATE ER 50 MG PO TB24
50.0000 mg | ORAL_TABLET | Freq: Every day | ORAL | Status: DC
  Administered 2021-04-03 – 2021-04-04 (×2): 50 mg via ORAL
  Filled 2021-04-03 (×2): qty 1

## 2021-04-03 MED ORDER — BUDESONIDE 0.5 MG/2ML IN SUSP
0.5000 mg | Freq: Two times a day (BID) | RESPIRATORY_TRACT | Status: DC
  Administered 2021-04-03 – 2021-04-04 (×2): 0.5 mg via RESPIRATORY_TRACT
  Filled 2021-04-03 (×2): qty 2

## 2021-04-03 MED ORDER — IPRATROPIUM-ALBUTEROL 0.5-2.5 (3) MG/3ML IN SOLN: 3.0000 mL | RESPIRATORY_TRACT | Status: DC | PRN

## 2021-04-03 MED ORDER — AZITHROMYCIN 250 MG PO TABS
500.0000 mg | ORAL_TABLET | Freq: Every day | ORAL | Status: DC
  Administered 2021-04-03 – 2021-04-04 (×2): 500 mg via ORAL
  Filled 2021-04-03 (×2): qty 2

## 2021-04-03 MED ORDER — POTASSIUM CHLORIDE CRYS ER 20 MEQ PO TBCR
40.0000 meq | EXTENDED_RELEASE_TABLET | Freq: Once | ORAL | Status: AC
  Administered 2021-04-03: 40 meq via ORAL
  Filled 2021-04-03: qty 2

## 2021-04-03 MED ORDER — FUROSEMIDE 40 MG PO TABS: 40.0000 mg | ORAL_TABLET | Freq: Every day | ORAL | 3 refills | Status: DC

## 2021-04-03 MED ORDER — METHYLPREDNISOLONE SODIUM SUCC 125 MG IJ SOLR
125.0000 mg | Freq: Once | INTRAMUSCULAR | Status: AC
  Administered 2021-04-03: 125 mg via INTRAVENOUS
  Filled 2021-04-03: qty 2

## 2021-04-03 MED ORDER — GUAIFENESIN ER 600 MG PO TB12
600.0000 mg | ORAL_TABLET | Freq: Two times a day (BID) | ORAL | Status: DC
  Administered 2021-04-03 – 2021-04-04 (×3): 600 mg via ORAL
  Filled 2021-04-03 (×3): qty 1

## 2021-04-03 NOTE — Plan of Care (Signed)
  Problem: Education: Goal: Knowledge of General Education information will improve Description: Including pain rating scale, medication(s)/side effects and non-pharmacologic comfort measures Outcome: Progressing   Problem: Health Behavior/Discharge Planning: Goal: Ability to manage health-related needs will improve Outcome: Progressing   Problem: Clinical Measurements: Goal: Ability to maintain clinical measurements within normal limits will improve Outcome: Progressing Goal: Will remain free from infection Outcome: Progressing Goal: Diagnostic test results will improve Outcome: Progressing Goal: Respiratory complications will improve Outcome: Progressing Goal: Cardiovascular complication will be avoided Outcome: Progressing   Problem: Activity: Goal: Risk for activity intolerance will decrease Outcome: Progressing   Problem: Nutrition: Goal: Adequate nutrition will be maintained Outcome: Progressing   Problem: Coping: Goal: Level of anxiety will decrease Outcome: Progressing   Problem: Elimination: Goal: Will not experience complications related to bowel motility Outcome: Progressing Goal: Will not experience complications related to urinary retention Outcome: Progressing   Problem: Pain Managment: Goal: General experience of comfort will improve Outcome: Progressing   Problem: Safety: Goal: Ability to remain free from injury will improve Outcome: Progressing   Problem: Skin Integrity: Goal: Risk for impaired skin integrity will decrease Outcome: Progressing   Problem: Education: Goal: Understanding of CV disease, CV risk reduction, and recovery process will improve Outcome: Progressing Goal: Individualized Educational Video(s) Outcome: Progressing   Problem: Activity: Goal: Ability to return to baseline activity level will improve Outcome: Progressing   Problem: Cardiovascular: Goal: Ability to achieve and maintain adequate cardiovascular perfusion  will improve Outcome: Progressing Goal: Vascular access site(s) Level 0-1 will be maintained Outcome: Progressing   Problem: Health Behavior/Discharge Planning: Goal: Ability to safely manage health-related needs after discharge will improve Outcome: Progressing   Problem: Education: Goal: Ability to demonstrate management of disease process will improve Outcome: Progressing Goal: Ability to verbalize understanding of medication therapies will improve Outcome: Progressing Goal: Individualized Educational Video(s) Outcome: Progressing   Problem: Activity: Goal: Capacity to carry out activities will improve Outcome: Progressing   Problem: Cardiac: Goal: Ability to achieve and maintain adequate cardiopulmonary perfusion will improve Outcome: Progressing   

## 2021-04-03 NOTE — Progress Notes (Signed)
[facility] Cardiology [facility] Encounter Note  Patient: Andrea Phillips / Admit Date: 04/01/2021 / Date of Encounter: 04/03/2021, 6:53 AM   Subjective: Patient feels at this time with less shortness of breath after some diet recess.  No evidence of anginal symptoms at this time.  Overall urine output has been greater than 1000 mL with improvements of symptoms  Cardiac catheterization showing normal coronary arteries without evidence of stenosis  Echocardiogram previously showing global LV systolic dysfunction with ejection fraction of 30%  Review of Systems: Positive for: Shortness of breath Negative for: Vision change, hearing change, syncope, dizziness, nausea, vomiting,diarrhea, bloody stool, stomach pain, cough, congestion, diaphoresis, urinary frequency, urinary pain,skin lesions, skin rashes Others previously listed  Objective: Telemetry: Normal sinus rhythm Physical Exam: Blood pressure (!) 112/58, pulse 68, temperature 97.6 F (36.4 C), temperature source Oral, resp. rate 20, height 5' (1.524 m), weight 71.6 kg, SpO2 93 %. Body mass index is 30.82 kg/m. General: Well developed, well nourished, in no acute distress. Head: Normocephalic, atraumatic, sclera non-icteric, no xanthomas, nares are without discharge. Neck: No apparent masses Lungs: Normal respirations with no wheezes, no rhonchi, no rales , no crackles   Heart: Regular rate and rhythm, normal S1 S2, no murmur, no rub, no gallop, PMI is normal size and placement, carotid upstroke normal without bruit, jugular venous pressure normal Abdomen: Soft, non-tender, non-distended with normoactive bowel sounds. No hepatosplenomegaly. Abdominal aorta is normal size without bruit Extremities: No edema, no clubbing, no cyanosis, no ulcers,  Peripheral: 2+ radial, 2+ femoral, 2+ dorsal pedal pulses Neuro: Alert and oriented. Moves all extremities spontaneously. Psych:  Responds to questions appropriately with a normal  affect.   Intake/Output Summary (Last 24 hours) at 04/03/2021 0653 Last data filed at 04/03/2021 0400 Gross per 24 hour  Intake 800 ml  Output 1000 ml  Net -200 ml     Inpatient Medications:   carvedilol  12.5 mg Oral BID   enoxaparin (LOVENOX) injection  40 mg Subcutaneous Q24H   furosemide  40 mg Intravenous Daily   isosorbide mononitrate  30 mg Oral Daily   levothyroxine  112 mcg Oral Daily   sodium chloride flush  3 mL Intravenous Q12H   sodium chloride flush  3 mL Intravenous Q12H   spironolactone  25 mg Oral Daily   Infusions:   sodium chloride      Labs: Recent Labs    04/02/21 0450 04/03/21 0436  NA 139 138  K 4.2 3.6  CL 101 102  CO2 28 28  GLUCOSE 176* 125*  BUN 16 20  CREATININE 0.84 0.83  CALCIUM 9.6 9.4  MG 1.9 2.0  PHOS 3.3 3.3    Recent Labs    04/01/21 2246 04/02/21 0450 04/03/21 0436  AST 20 21  --   ALT 14 15  --   ALKPHOS 46 44  --   BILITOT 0.9 0.8  --   PROT 7.1 7.7  --   ALBUMIN 3.6 3.9 3.2*    Recent Labs    04/01/21 2246 04/02/21 0450 04/03/21 0436  WBC 6.2 5.6 7.3  NEUTROABS 4.5 4.9  --   HGB 15.5* 16.3* 15.5*  HCT 44.0 46.7* 44.7  MCV 86.3 85.8 85.8  PLT 214 214 264    Recent Labs    04/02/21 0058  CKTOTAL 113    Invalid input(s): POCBNP No results for input(s): HGBA1C in the last 72 hours.   Weights: Filed Weights   04/02/21 0836 04/02/21 1250 04/03/21 0320  Weight: 72.1 kg 71.3 kg 71.6 kg     Radiology/Studies:  DG Chest 2 View  Result Date: 04/01/2021 CLINICAL DATA:  Shortness of breath and cough. EXAM: CHEST - 2 VIEW COMPARISON:  December 21, 2020 FINDINGS: Mildly increased lung markings are noted. This is stable in appearance when compared to the prior study. There is no evidence of acute infiltrate, pleural effusion or pneumothorax. The cardiac silhouette is borderline in size. Moderate severity calcification of the aortic arch is noted. The visualized skeletal structures are unremarkable. IMPRESSION:  Stable exam without active cardiopulmonary disease. Electronically Signed   By: Aram Candela M.D.   On: 04/01/2021 23:56   CARDIAC CATHETERIZATION  Result Date: 04/02/2021 86 year old female with known chronic systolic dysfunction congestive heart failure on appropriate medication management having chest pain shortness of breath and elevated troponin Cardiac catheterization showing normal coronary arteries with 0% stenosis Echocardiogram showing global LV systolic dysfunction ejection of 35% Plan Continue medication management for acute on chronic systolic dysfunction congestive heart failure No further cardiac intervention at this time Cardiac rehabilitation  ECHOCARDIOGRAM COMPLETE  Result Date: 04/02/2021    ECHOCARDIOGRAM REPORT   Patient Name:   Andrea Phillips Date of Exam: 04/02/2021 Medical Rec #:  [id number]           Height:       60.0 in Accession #:    [id number]          Weight:       159.0 lb Date of Birth:  Jan 03, 1935           BSA:          1.693 m Patient Age:    86 years            BP:           171/73 mmHg Patient Gender: F                   HR:           50 bpm. Exam Location:  [facility] Procedure: 2D Echo, Cardiac Doppler and Color Doppler Indications:     NSTEMI I21.4  History:         Patient has prior history of Echocardiogram examinations, most                  recent 04/08/2020. Signs/Symptoms:Shortness of Breath and                  Dyspnea.  Sonographer:     Cristela Blue RDCS (AE) Referring Phys:  Kenn File DOUTOVA Diagnosing Phys: Arnoldo Hooker MD  Sonographer Comments: No apical window, no subcostal window and Technically challenging study due to limited acoustic windows. IMPRESSIONS  1. Left ventricular ejection fraction, by estimation, is 25 to 30%. The left ventricle has severely decreased function. The left ventricle demonstrates global hypokinesis. The left ventricular internal cavity size was moderately dilated. Left ventricular diastolic parameters were normal.  2.  Right ventricular systolic function is normal. The right ventricular size is normal.  3. The mitral valve is normal in structure. Mild to moderate mitral valve regurgitation.  4. Tricuspid valve regurgitation is mild to moderate.  5. The aortic valve is normal in structure. Aortic valve regurgitation is not visualized. FINDINGS  Left Ventricle: Left ventricular ejection fraction, by estimation, is 25 to 30%. The left ventricle has severely decreased function. The left ventricle demonstrates global hypokinesis. The left ventricular internal cavity size was moderately dilated. There is no left  ventricular hypertrophy. Left ventricular diastolic parameters were normal. Right Ventricle: The right ventricular size is normal. No increase in right ventricular wall thickness. Right ventricular systolic function is normal. Left Atrium: Left atrial size was normal in size. Right Atrium: Right atrial size was normal in size. Pericardium: There is no evidence of pericardial effusion. Mitral Valve: The mitral valve is normal in structure. Mild to moderate mitral valve regurgitation. Tricuspid Valve: The tricuspid valve is normal in structure. Tricuspid valve regurgitation is mild to moderate. Aortic Valve: The aortic valve is normal in structure. Aortic valve regurgitation is not visualized. Pulmonic Valve: The pulmonic valve was normal in structure. Pulmonic valve regurgitation is trivial. Aorta: The aortic root and ascending aorta are structurally normal, with no evidence of dilitation. IAS/Shunts: No atrial level shunt detected by color flow Doppler.  LEFT VENTRICLE PLAX 2D LVIDd:         5.08 cm LVIDs:         4.15 cm LV PW:         0.94 cm LV IVS:        0.68 cm LVOT diam:     2.00 cm LVOT Area:     3.14 cm  LEFT ATRIUM         Index LA diam:    3.80 cm 2.24 cm/m                        PULMONIC VALVE AORTA                 PV Vmax:        0.70 m/s Ao Root diam: 2.60 cm PV Peak grad:   1.9 mmHg                       RVOT  Peak grad: 2 mmHg   SHUNTS Systemic Diam: 2.00 cm Arnoldo Hooker MD Electronically signed by Arnoldo Hooker MD Signature Date/Time: 04/02/2021/3:33:24 PM    Final      Assessment and Recommendation  86 y.o. female with acute on chronic systolic dysfunction congestive heart failure with elevated troponin consistent with demand ischemia currently slowly improving with cardiac catheterization showing normal coronary arteries and no evidence of significant 1.  Diuresis with current medical regimen of intravenous furosemide for acute on chronic systolic dysfunction congestive heart failure with giving oral Lasix on discharge of 40 mg each day 2.  Continuation of medication management including beta-blocker and ACE inhibitor for cardiomyopathy 3.  No further cardiac diagnostics necessary at this time 4.  If patient ambulating well today okay for discharged home from cardiovascular standpoint with follow-up next week Signed, Arnoldo Hooker M.D. FACC

## 2021-04-03 NOTE — Progress Notes (Signed)
PROGRESS NOTE  Andrea Phillips BTY:[id number] DOB: May 09, 1935   PCP: Margaretann Loveless, MD  Patient is from: Home.  Lives with family.  DOA: 04/01/2021 LOS: 1  Chief complaints: Shortness of breath  Brief Narrative / Interim history: 86 year old F with PMH of systolic CHF, OSA on CPAP, asthma, HTN, hypothyroidism, HLD and GERD presenting with shortness of breath, productive cough and wheeze for about a week, and admitted for non-STEMI and acute on chronic systolic CHF.  Troponin elevated to 256 and trended up to 514.  BNP 560.  CXR without acute finding.  Of note, multiple family members with URI symptoms.    Patient was started on IV heparin and IV Lasix. LHC on 6/10 with clean coronaries.  IV heparin discontinued.  TTE with LVEF of 25 to 30% (previously 30 to 35%) and mild to moderate MVR and TVR.  Cleared for discharge on p.o. Lasix from cardiology standpoint.  However, patient continued to have shortness of breath, increased work of breathing and productive cough concerning for asthma exacerbation.  She was a started on systemic steroid and breathing treatments for asthma exacerbation.  Subjective: Seen and examined earlier this morning.  She has some coughing fit for about 3 minutes this morning that admitted hard to breath.  She reports weight cough but not able to cough it up.  She denies chest pain, GI or UTI symptoms.  Objective: Vitals:   04/02/21 2245 04/03/21 0320 04/03/21 0729 04/03/21 1140  BP: (!) 138/51 (!) 112/58 129/61 (!) 144/58  Pulse:  68 (!) 53 61  Resp:  20 16 16   Temp:  97.6 F (36.4 C) 98 F (36.7 C) 98.2 F (36.8 C)  TempSrc:  Oral Oral Oral  SpO2:  93% 95% 92%  Weight:  71.6 kg    Height:        Intake/Output Summary (Last 24 hours) at 04/03/2021 1431 Last data filed at 04/03/2021 0400 Gross per 24 hour  Intake 560 ml  Output 1000 ml  Net -440 ml   Filed Weights   04/02/21 0836 04/02/21 1250 04/03/21 0320  Weight: 72.1 kg 71.3 kg 71.6 kg     Examination:  GENERAL: No apparent distress.  Nontoxic. HEENT: MMM.  Vision and hearing grossly intact.  NECK: Supple.  No apparent JVD.  RESP: 95% on 3 L.  Some work of breathing.  Rhonchi and expiratory wheeze bilaterally. CVS:  RRR. Heart sounds normal.  ABD/GI/GU: BS+. Abd soft, NTND.  MSK/EXT:  Moves extremities. No apparent deformity. No edema.  SKIN: no apparent skin lesion or wound NEURO: Awake and alert. Oriented appropriately.  No apparent focal neuro deficit. PSYCH: Calm. Normal affect.   Procedures:  6/10-LHC with normal coronaries and LVEF of 35%.  Microbiology summarized: COVID-19 PCR pending.  Assessment & Plan: NSTEMI: Troponin 256>514> 446.  Twelve-lead EKG sinus rhythm with multiple PVCs but no acute ischemic finding.  LHC with normal coronaries.  Patient without chest pain. -Changed Coreg to Toprol-XL for better beta-1 selectivity in the setting of asthma exacerbation -CHF and asthma exacerbation as below   Acute on chronic systolic CHF: TTE with LVEF of 25 to 30% (previously 30 to 35%), mild to moderate TVR and MVR.  BNP elevated to 560 but no prior to compare to.  Started on IV Lasix.  About 1 L UOP/24 hours.  Renal function stable. -Continue IV Lasix 40 mg daily -GDMT-changed Coreg to metoprolol as above.  Continue Imdur and Aldactone -Monitor fluid and respiratory status, renal functions.  Asthma exacerbation-likely due to recent URI.  Lung exam with rhonchi and expiratory wheeze.  She also has some work of breathing and weight cough. -Start IV Solu-Medrol -Pulmicort, Brovana, as needed DuoNeb and mucolytic's -Z-Pak -Incentive spirometry/OOB/PT/OT  OSA on CPAP -Continue nightly CPAP.   Essential hypertension -Cardiac meds as above.   Mildly elevated D-dimer: Low suspicion for VTE.    Hypothyroidism -Continue home Synthroid.  Debility/physical deconditioning -PT/OT.      Body mass index is 30.82 kg/m.         DVT prophylaxis:   enoxaparin (LOVENOX) injection 40 mg Start: 04/02/21 2200  Code Status: Full code Family Communication: Patient and/or RN.  Updated patient's niece at bedside. Level of care: Progressive Cardiac Status is: Inpatient  Remains inpatient appropriate because:Ongoing diagnostic testing needed not appropriate for outpatient work up, IV treatments appropriate due to intensity of illness or inability to take PO, and Inpatient level of care appropriate due to severity of illness  Dispo: The patient is from: Home              Anticipated d/c is to: Home              Patient currently is not medically stable to d/c.   Difficult to place patient No       Consultants:  Cardiology   Sch Meds:  Scheduled Meds:  arformoterol  15 mcg Nebulization BID   azithromycin  500 mg Oral Daily   budesonide (PULMICORT) nebulizer solution  0.5 mg Nebulization BID   enoxaparin (LOVENOX) injection  40 mg Subcutaneous Q24H   furosemide  40 mg Intravenous Daily   guaiFENesin  600 mg Oral BID   levothyroxine  112 mcg Oral Daily   methylPREDNISolone (SOLU-MEDROL) injection  60 mg Intravenous Q12H   metoprolol succinate  50 mg Oral Daily   pantoprazole  40 mg Oral Daily   sodium chloride flush  3 mL Intravenous Q12H   sodium chloride flush  3 mL Intravenous Q12H   spironolactone  25 mg Oral Daily   Continuous Infusions:  sodium chloride     PRN Meds:.sodium chloride, acetaminophen **OR** acetaminophen, acetaminophen, guaiFENesin-dextromethorphan, HYDROcodone-acetaminophen, ipratropium-albuterol, ondansetron (ZOFRAN) IV, sodium chloride flush  Antimicrobials: Anti-infectives (From admission, onward)    Start     Dose/Rate Route Frequency Ordered Stop   04/03/21 1000  azithromycin (ZITHROMAX) tablet 500 mg        500 mg Oral Daily 04/03/21 0902 04/08/21 0959        I have personally reviewed the following labs and images: CBC: Recent Labs  Lab 04/01/21 2246 04/02/21 0450 04/03/21 0436  WBC  6.2 5.6 7.3  NEUTROABS 4.5 4.9  --   HGB 15.5* 16.3* 15.5*  HCT 44.0 46.7* 44.7  MCV 86.3 85.8 85.8  PLT 214 214 264   BMP &GFR Recent Labs  Lab 04/01/21 2246 04/02/21 0058 04/02/21 0450 04/03/21 0436  NA 139  --  139 138  K 4.3  --  4.2 3.6  CL 105  --  101 102  CO2 25  --  28 28  GLUCOSE 126*  --  176* 125*  BUN 15  --  16 20  CREATININE 1.02*  --  0.84 0.83  CALCIUM 9.6  --  9.6 9.4  MG  --  1.9 1.9 2.0  PHOS  --   --  3.3 3.3   Estimated Creatinine Clearance: 44.5 mL/min (by C-G formula based on SCr of 0.83 mg/dL). Liver & Pancreas: Recent Labs  Lab 04/01/21 2246 04/02/21 0450 04/03/21 0436  AST 20 21  --   ALT 14 15  --   ALKPHOS 46 44  --   BILITOT 0.9 0.8  --   PROT 7.1 7.7  --   ALBUMIN 3.6 3.9 3.2*   No results for input(s): LIPASE, AMYLASE in the last 168 hours. No results for input(s): AMMONIA in the last 168 hours. Diabetic: No results for input(s): HGBA1C in the last 72 hours. No results for input(s): GLUCAP in the last 168 hours. Cardiac Enzymes: Recent Labs  Lab 04/02/21 0058  CKTOTAL 113   No results for input(s): PROBNP in the last 8760 hours. Coagulation Profile: Recent Labs  Lab 04/02/21 0058  INR 1.0   Thyroid Function Tests: Recent Labs    04/02/21 0450  TSH 0.665   Lipid Profile: Recent Labs    04/02/21 0450  CHOL 170  HDL 46  LDLCALC 112*  TRIG 60  CHOLHDL 3.7   Anemia Panel: Recent Labs    04/01/21 2246  FERRITIN 302   Urine analysis:    Component Value Date/Time   COLORURINE COLORLESS (A) 04/02/2021 0301   APPEARANCEUR CLEAR (A) 04/02/2021 0301   LABSPEC 1.004 (L) 04/02/2021 0301   PHURINE 5.0 04/02/2021 0301   GLUCOSEU NEGATIVE 04/02/2021 0301   HGBUR NEGATIVE 04/02/2021 0301   BILIRUBINUR NEGATIVE 04/02/2021 0301   KETONESUR NEGATIVE 04/02/2021 0301   PROTEINUR NEGATIVE 04/02/2021 0301   NITRITE NEGATIVE 04/02/2021 0301   LEUKOCYTESUR NEGATIVE 04/02/2021 0301   Sepsis Labs: Invalid input(s):  PROCALCITONIN, LACTICIDVEN  Microbiology: Recent Results (from the past 240 hour(s))  SARS CORONAVIRUS 2 (TAT 6-24 HRS) Nasopharyngeal Nasopharyngeal Swab     Status: None   Collection Time: 04/01/21 10:53 PM   Specimen: Nasopharyngeal Swab  Result Value Ref Range Status   SARS Coronavirus 2 NEGATIVE NEGATIVE Final    Comment: (NOTE) SARS-CoV-2 target nucleic acids are NOT DETECTED.  The SARS-CoV-2 RNA is generally detectable in upper and lower respiratory specimens during the acute phase of infection. Negative results do not preclude SARS-CoV-2 infection, do not rule out co-infections with other pathogens, and should not be used as the sole basis for treatment or other patient management decisions. Negative results must be combined with clinical observations, patient history, and epidemiological information. The expected result is Negative.  Fact Sheet for Patients: HairSlick.no  Fact Sheet for Healthcare Providers: quierodirigir.com  This test is not yet approved or cleared by the [place] FDA and  has been authorized for detection and/or diagnosis of SARS-CoV-2 by FDA under an Emergency Use Authorization (EUA). This EUA will remain  in effect (meaning this test can be used) for the duration of the COVID-19 declaration under Se ction 564(b)(1) of the Act, 21 U.S.C. section 360bbb-3(b)(1), unless the authorization is terminated or revoked sooner.  Performed at [facility], [address], [place], [place] [address]   Urine Culture     Status: Abnormal   Collection Time: 04/02/21  3:01 AM   Specimen: Urine, Random  Result Value Ref Range Status   Specimen Description   Final    URINE, RANDOM Performed at [facility], [address], [place], [place] [address]    Special Requests   Final    NONE Performed at [facility], [address] [place], [place] [address]    Culture MULTIPLE  SPECIES PRESENT, SUGGEST RECOLLECTION (A)  Final   Report Status 04/03/2021 FINAL  Final    Radiology  Studies: No results found.    Nyna Chilton T. Cooper Moroney [facility]  If 7PM-7AM, please contact night-coverage [email or web address] 04/03/2021, 2:31 PM

## 2021-04-03 NOTE — Evaluation (Signed)
Physical Therapy Evaluation Patient Details Name: Andrea Phillips MRN: [id number] DOB: [birth year] Today's Date: 04/03/2021   History of Present Illness  Pt is an 86 y/o F admitted from home on 04/01/21 with c/c of SOB & cough. Pt admitted for non-STEMI & acute on chronic systolic CHF. PMH: chronic systolic CHF, OSA on CPAP, HTN, HLD, GERD, hypothyroidism, asthma  Clinical Impression  Pt seen for PT evaluation with pt ambulating out of bathroom without AD or assistance. PT assist pt with donning clean gown then pt ambulates 2 laps around small pod without AD & CGA<>close supervision with education not to hold to furniture. Pt requires seated rest break 2/2 fatigue when PT educates pt on use of incentive spirometer but pt notes she's too fatigued to use at this time. Pt does note lightheadedness after gait but BP 136/61 mmHg in LUE (MAP 82), HR 66 bpm. Pt on room air throughout session with SpO2 88% or >, able to increase O2 with pursed lip breathing. PT initially left pt on room air but pt reports she feels better with supplemental O2 so placed on 2L/min via nasal cannula & nurse made aware. Will continue to follow pt acutely to address endurance & balance.      Follow Up Recommendations Home health PT;Supervision - Intermittent    Equipment Recommendations  None recommended by PT    Recommendations for Other Services       Precautions / Restrictions Precautions Precautions: Fall Restrictions Weight Bearing Restrictions: No      Mobility  Bed Mobility               General bed mobility comments: not observed, pt received up in room    Transfers Overall transfer level: Independent               General transfer comment: sit<>stand without AD  Ambulation/Gait Ambulation/Gait assistance: Supervision;Min guard Gait Distance (Feet): 200 Feet Assistive device: None Gait Pattern/deviations: Decreased step length - left;Decreased step length - right;Decreased stride  length     General Gait Details: RUE flexed up & held by trunk  Stairs            Wheelchair Mobility    Modified Rankin (Stroke Patients Only)       Balance Overall balance assessment: Needs assistance Sitting-balance support: Feet supported;No upper extremity supported Sitting balance-Leahy Scale: Normal       Standing balance-Leahy Scale: Good                               Pertinent Vitals/Pain Pain Assessment: No/denies pain    Home Living Family/patient expects to be discharged to:: Private residence Living Arrangements: Children Available Help at Discharge: Family;Available 24 hours/day (lives with adult granddaughter & multiple young grandchildren (ages 7y/o - 2 y/o)) Type of Home: House Home Access: Stairs to enter Entrance Stairs-Rails: Right Entrance Stairs-Number of Steps: 2 Home Layout: Two level;Able to live on main level with bedroom/bathroom Home Equipment: Dan Humphreys - 2 wheels;Walker - standard ("walking stick")      Prior Function           Comments: Pt reports using "walking stick" outisde, cooks, cleans, but then states her grandchildren help her     Hand Dominance        Extremity/Trunk Assessment   Upper Extremity Assessment Upper Extremity Assessment: Overall WFL for tasks assessed    Lower Extremity Assessment Lower Extremity Assessment: Generalized weakness  Cervical / Trunk Assessment Cervical / Trunk Assessment: Normal  Communication   Communication: No difficulties  Cognition Arousal/Alertness: Awake/alert Behavior During Therapy: WFL for tasks assessed/performed Overall Cognitive Status: Within Functional Limits for tasks assessed                                        General Comments General comments (skin integrity, edema, etc.): Educated pt on use of incentive spirometer but pt with difficulty return demonstrating then endorses too much fatigue for further attempts. Pt endorses  lightheadedness after gait, BP 136/61 mmHg in LUE (MAP 82), HR 66 bpm    Exercises     Assessment/Plan    PT Assessment Patient needs continued PT services  PT Problem List Decreased strength;Decreased mobility;Decreased balance;Decreased knowledge of use of DME;Decreased activity tolerance;Cardiopulmonary status limiting activity       PT Treatment Interventions DME instruction;Therapeutic exercise;Gait training;Balance training;Stair training;Neuromuscular re-education;Functional mobility training;Cognitive remediation;Therapeutic activities;Patient/family education    PT Goals (Current goals can be found in the Care Plan section)  Acute Rehab PT Goals Patient Stated Goal: get better PT Goal Formulation: With patient Time For Goal Achievement: 04/17/21 Potential to Achieve Goals: Good    Frequency Min 2X/week   Barriers to discharge        Co-evaluation               AM-PAC PT "6 Clicks" Mobility  Outcome Measure Help needed turning from your back to your side while in a flat bed without using bedrails?: None Help needed moving from lying on your back to sitting on the side of a flat bed without using bedrails?: None Help needed moving to and from a bed to a chair (including a wheelchair)?: A Little Help needed standing up from a chair using your arms (e.g., wheelchair or bedside chair)?: None Help needed to walk in hospital room?: A Little Help needed climbing 3-5 steps with a railing? : A Little 6 Click Score: 21    End of Session Equipment Utilized During Treatment: Gait belt Activity Tolerance: Patient tolerated treatment well Patient left: in chair;with call bell/phone within reach;with chair alarm set;with family/visitor present Nurse Communication: Mobility status (O2) PT Visit Diagnosis: Muscle weakness (generalized) (M62.81);Unsteadiness on feet (R26.81)    Time: 9741-6384 PT Time Calculation (min) (ACUTE ONLY): 28 min   Charges:   PT  Evaluation $PT Eval Low Complexity: 1 Low PT Treatments $Therapeutic Activity: 8-22 mins        Aleda Grana, PT, DPT 04/03/21, 12:18 PM   Sandi Mariscal 04/03/2021, 12:16 PM

## 2021-04-03 NOTE — Evaluation (Signed)
Occupational Therapy Evaluation Patient Details Name: Andrea Phillips MRN: [id number] DOB: 1935/04/18 Today's Date: 04/03/2021    History of Present Illness Pt is an 86 y/o F admitted from home on 04/01/21 with c/c of SOB & cough. Pt admitted for non-STEMI & acute on chronic systolic CHF. PMH: chronic systolic CHF, OSA on CPAP, HTN, HLD, GERD, hypothyroidism, asthma   Clinical Impression   Andrea Phillips was seen for OT evaluation this date. Prior to hospital admission, pt was Independent for mobility and ADLs. Pt lives with granddaughter and her great-grandchildren. Pt presents to acute OT demonstrating impaired ADL performance and functional mobility 2/2 decreased activity tolerance and functinoal balance deficits. Pt currently requires SBA for standing grooming and toilet t/f - assist for mild balance deficits. Pt would benefit from skilled OT to address noted impairments and functional limitations (see below for any additional details) in order to maximize safety and independence while minimizing falls risk and caregiver burden. Upon hospital discharge, anticipate no OT follow up needed.     Follow Up Recommendations  No OT follow up;Supervision - Intermittent    Equipment Recommendations  None recommended by OT    Recommendations for Other Services       Precautions / Restrictions Precautions Precautions: Fall Restrictions Weight Bearing Restrictions: No      Mobility Bed Mobility Overal bed mobility: Modified Independent             General bed mobility comments: HOB elevated    Transfers Overall transfer level: Independent               General transfer comment: sit<>stand without AD    Balance Overall balance assessment: Needs assistance Sitting-balance support: Feet supported;No upper extremity supported Sitting balance-Leahy Scale: Normal     Standing balance support: During functional activity;No upper extremity supported Standing balance-Leahy  Scale: Good                             ADL either performed or assessed with clinical judgement   ADL Overall ADL's : Needs assistance/impaired                                       General ADL Comments: SBA for standing grooming and toilet t/f - assist for mild balance deficits      Pertinent Vitals/Pain Pain Assessment: No/denies pain     Hand Dominance Right   Extremity/Trunk Assessment Upper Extremity Assessment Upper Extremity Assessment: Overall WFL for tasks assessed   Lower Extremity Assessment Lower Extremity Assessment: Generalized weakness   Cervical / Trunk Assessment Cervical / Trunk Assessment: Normal   Communication Communication Communication: No difficulties   Cognition Arousal/Alertness: Awake/alert Behavior During Therapy: WFL for tasks assessed/performed Overall Cognitive Status: Within Functional Limits for tasks assessed                                        Exercises Exercises: Other exercises Other Exercises Other Exercises: Pt educated re: OT role, DME recs, d/c recs, ECS, PLB Other Exercises: LBD, grooming, sup<>sit, sit<>standx2, sitting/standing balance/tolerance, ~30 ft mobility   Shoulder Instructions      Home Living Family/patient expects to be discharged to:: Private residence Living Arrangements: Children Available Help at Discharge: Family;Available 24 hours/day (lives with adult granddaughter &  multiple young grandchildren (ages 7y/o - 13 y/o)) Type of Home: House Home Access: Stairs to enter Secretary/administrator of Steps: 2 Entrance Stairs-Rails: Right Home Layout: Two level;Able to live on main level with bedroom/bathroom     Bathroom Shower/Tub: Walk-in shower         Home Equipment: Environmental consultant - 2 wheels;Walker - standard          Prior Functioning/Environment Level of Independence: Independent        Comments: Pt reports driving, cooks, cleans        OT  Problem List: Decreased strength;Decreased activity tolerance;Impaired balance (sitting and/or standing)      OT Treatment/Interventions: Self-care/ADL training;Therapeutic exercise;Energy conservation;Therapeutic activities;Balance training;Patient/family education    OT Goals(Current goals can be found in the care plan section) Acute Rehab OT Goals Patient Stated Goal: get better OT Goal Formulation: With patient/family Time For Goal Achievement: 04/17/21 Potential to Achieve Goals: Good ADL Goals Pt Will Perform Grooming: Independently;standing Pt Will Perform Lower Body Dressing: Independently;sit to/from stand Pt Will Transfer to Toilet: Independently;ambulating;regular height toilet  OT Frequency: Min 1X/week    AM-PAC OT "6 Clicks" Daily Activity     Outcome Measure Help from another person eating meals?: None Help from another person taking care of personal grooming?: A Little Help from another person toileting, which includes using toliet, bedpan, or urinal?: A Little Help from another person bathing (including washing, rinsing, drying)?: A Little Help from another person to put on and taking off regular upper body clothing?: None Help from another person to put on and taking off regular lower body clothing?: None 6 Click Score: 21   End of Session    Activity Tolerance: Patient tolerated treatment well Patient left: in bed;with nursing/sitter in room;with family/visitor present  OT Visit Diagnosis: Other abnormalities of gait and mobility (R26.89);Muscle weakness (generalized) (M62.81)                Time: 1003-1030 OT Time Calculation (min): 27 min Charges:  OT General Charges $OT Visit: 1 Visit OT Evaluation $OT Eval Low Complexity: 1 Low OT Treatments $Self Care/Home Management : 8-22 mins  Kathie Dike, M.S. OTR/L  04/03/21, 1:03 PM  ascom [phone]

## 2021-04-03 NOTE — OR Nursing (Addendum)
R radial cath site assessed, transparent dressing and gauze are clean, dry and intact. Radial pulse +2. Patient reports no pain at site. Will continue to monitor.   22:45 r radial site reassessed, no changes.  03:35 r radial site reassessed, no changes

## 2021-04-03 NOTE — Plan of Care (Signed)
?  Problem: Activity: ?Goal: Risk for activity intolerance will decrease ?Outcome: Progressing ?  ?Problem: Nutrition: ?Goal: Adequate nutrition will be maintained ?Outcome: Progressing ?  ?Problem: Elimination: ?Goal: Will not experience complications related to urinary retention ?Outcome: Progressing ?  ?Problem: Pain Managment: ?Goal: General experience of comfort will improve ?Outcome: Progressing ?  ?

## 2021-04-04 DIAGNOSIS — E669 Obesity, unspecified: Secondary | ICD-10-CM

## 2021-04-04 DIAGNOSIS — Z6831 Body mass index (BMI) 31.0-31.9, adult: Secondary | ICD-10-CM

## 2021-04-04 LAB — RENAL FUNCTION PANEL
Albumin: 3.4 g/dL — ABNORMAL LOW (ref 3.5–5.0)
Anion gap: 9 (ref 5–15)
BUN: 23 mg/dL (ref 8–23)
CO2: 28 mmol/L (ref 22–32)
Calcium: 9.5 mg/dL (ref 8.9–10.3)
Chloride: 103 mmol/L (ref 98–111)
Creatinine, Ser: 0.65 mg/dL (ref 0.44–1.00)
GFR, Estimated: >60 mL/min (ref >60)
Glucose, Bld: 150 mg/dL — ABNORMAL HIGH (ref 70–99)
Phosphorus: 3.3 mg/dL (ref 2.5–4.6)
Potassium: 4 mmol/L (ref 3.5–5.1)
Sodium: 140 mmol/L (ref 135–145)

## 2021-04-04 LAB — MAGNESIUM: Magnesium: 2.2 mg/dL (ref 1.7–2.4)

## 2021-04-04 MED ORDER — IRBESARTAN 150 MG PO TABS
150.0000 mg | ORAL_TABLET | Freq: Every day | ORAL | Status: DC
  Administered 2021-04-04: 150 mg via ORAL
  Filled 2021-04-04: qty 1

## 2021-04-04 MED ORDER — DULERA 200-5 MCG/ACT IN AERO: 2.0000 | INHALATION_SPRAY | Freq: Two times a day (BID) | RESPIRATORY_TRACT | 1 refills | Status: AC

## 2021-04-04 MED ORDER — OMEPRAZOLE MAGNESIUM 20 MG PO TBEC: 40.0000 mg | DELAYED_RELEASE_TABLET | Freq: Every day | ORAL | 0 refills | Status: DC

## 2021-04-04 MED ORDER — GUAIFENESIN ER 600 MG PO TB12: 600.0000 mg | ORAL_TABLET | Freq: Two times a day (BID) | ORAL | Status: DC

## 2021-04-04 MED ORDER — HYDRALAZINE HCL 25 MG PO TABS: 25.0000 mg | ORAL_TABLET | Freq: Three times a day (TID) | ORAL | 1 refills | Status: DC

## 2021-04-04 MED ORDER — PREDNISONE 20 MG PO TABS: 40.0000 mg | ORAL_TABLET | Freq: Every day | ORAL | 0 refills | Status: AC

## 2021-04-04 MED ORDER — HYDRALAZINE HCL 25 MG PO TABS
25.0000 mg | ORAL_TABLET | Freq: Three times a day (TID) | ORAL | Status: DC
  Administered 2021-04-04: 25 mg via ORAL
  Filled 2021-04-04: qty 1

## 2021-04-04 MED ORDER — METOPROLOL SUCCINATE ER 50 MG PO TB24: 50.0000 mg | ORAL_TABLET | Freq: Every day | ORAL | 1 refills | Status: DC

## 2021-04-04 NOTE — Plan of Care (Signed)
  Problem: Clinical Measurements: Goal: Diagnostic test results will improve Outcome: Progressing   Problem: Activity: Goal: Risk for activity intolerance will decrease Outcome: Progressing   Problem: Nutrition: Goal: Adequate nutrition will be maintained Outcome: Progressing   

## 2021-04-04 NOTE — Progress Notes (Signed)
Patient left before HH arranged. [facility] will contact patient at home per [facility]

## 2021-04-04 NOTE — Discharge Summary (Signed)
Physician Discharge Summary  [name]:[id number] DOB: 11/24/1934 DOA: 04/01/2021  PCP: Margaretann Loveless, MD  Admit date: 04/01/2021 Discharge date: 04/04/2021  Admitted From: Home Disposition: Home  Recommendations for Outpatient Follow-up:  Follow ups as below. Please obtain CBC/BMP/Mag at follow up Please follow up on the following pending results: None  Home Health: PT Equipment/Devices: None required  Discharge Condition: Stable CODE STATUS: Full code   Follow-up Information     Lamar Blinks, MD Follow up in 1 week(s).   Specialty: Cardiology Contact information: [address] [facility] [place] [address] [phone]         Margaretann Loveless, MD. Schedule an appointment as soon as possible for a visit in 1 week(s).   Specialty: Internal Medicine Contact information: [address] [place] [address] [phone]                   Hospital Course: 86 year old F with PMH of systolic CHF, OSA on CPAP, asthma, HTN, hypothyroidism, HLD and GERD presenting with shortness of breath, productive cough and wheeze for about a week, and admitted for non-STEMI and acute on chronic systolic CHF.  Troponin elevated to 256 and trended up to 514.  BNP 560.  CXR without acute finding.  Of note, multiple family members with URI symptoms.     Patient was started on IV heparin and IV Lasix. LHC on 6/10 with clean coronaries.  IV heparin discontinued.  TTE with LVEF of 25 to 30% (previously 30 to 35%) and mild to moderate MVR and TVR.  Cleared for discharge on p.o. Lasix from cardiology standpoint.  However, patient continued to have shortness of breath, increased work of breathing and productive cough concerning for asthma exacerbation.  She was a started on systemic steroid and breathing treatments for asthma exacerbation, and her symptoms improved .  On the day of discharge, she has completely weaned off oxygen.  She  maintained appropriate saturation with ambulation on room air.  She felt well and ready to go home.  She is discharged on prednisone for 3 more days.  Also added Dulera.  We have changed Coreg to Toprol-XL for better beta-1 selectivity.  Discontinue thiazide diuretics, and discharged on p.o. Lasix 40 mg daily per cardiology recommendation.   See individual problem list below for more on hospital course.  Discharge Diagnoses:  NSTEMI: Troponin 256>514> 446.  Twelve-lead EKG sinus rhythm with multiple PVCs but no acute ischemic finding.  LHC with normal coronaries.  Patient without chest pain. -Changed Coreg to Toprol-XL for better beta-1 selectivity in the setting of asthma exacerbation -CHF and asthma exacerbation as below   Acute on chronic systolic CHF: TTE with LVEF of 25 to 30% (previously 30 to 35%), mild to moderate TVR and MVR.  BNP elevated to 560 but no prior to compare to.  Diuresed with IV Lasix.  Net -2.3 L.  Renal function stable.  Cleared for discharge by cardiology. -Discharged on p.o. Lasix 40 mg daily per cardiology recommendation -GDMT-changed Coreg to metoprolol as above.  Continue Imdur, hydralazine and telmisartan -Reassess fluid status and renal function at follow-up.   Asthma exacerbation-likely due to recent URI. Improved after IV Solu-Medrol and breathing treatments. -Discharged on p.o. prednisone, Dulera and as needed albuterol.   OSA on CPAP -Continue nightly CPAP.   Essential hypertension: BP slightly elevated partly from holding her telmisartan and hydralazine to allow room for diuresis. -Cardiac meds as above.   Hypothyroidism -Continue  home Synthroid.   Debility/physical deconditioning -HH PT ordered.  Class I obesity Body mass index is 31.27 kg/m.            Discharge Exam: Vitals:   04/04/21 0500 04/04/21 0729  BP: (!) 179/89 (!) 156/74  Pulse: (!) 53 (!) 55  Resp: 18 17  Temp: 98.2 F (36.8 C) 97.7 F (36.5 C)  SpO2: 93% 99%     GENERAL: No apparent distress.  Nontoxic. HEENT: MMM.  Vision and hearing grossly intact.  NECK: Supple.  No apparent JVD.  RESP: On room air.  No IWOB.  Fair aeration bilaterally. CVS:  RRR. Heart sounds normal.  ABD/GI/GU: Bowel sounds present. Soft. Non tender.  MSK/EXT:  Moves extremities. No apparent deformity. No edema.  SKIN: no apparent skin lesion or wound NEURO: Awake, alert and oriented appropriately.  No apparent focal neuro deficit. PSYCH: Calm. Normal affect.   Discharge Instructions  Discharge Instructions     AMB Referral to Cardiac Rehabilitation - Phase II   Complete by: As directed    Diagnosis: NSTEMI   Diet - low sodium heart healthy   Complete by: As directed    Discharge instructions   Complete by: As directed    It has been a pleasure taking care of you!  You were hospitalized with shortness of breath due to heart failure exacerbation and asthma exacerbation..  You have been treated with fluid medication for heart failure exacerbation.  You have been treated with steroids and breathing treatments for your asthma exacerbation.  Your symptoms improved to the point we think it is safe to let you go home and follow-up with your doctors.  We have started you on new medication and a/or made adjustment to your home medication during this hospitalization.  Please review your new medication list and the directions on your medications before you take them.  Follow-up with your primary care doctor 1 to 2 weeks, and your cardiologist in 2 to 3 weeks.  In addition to taking your medications as prescribed, we also recommend you avoid alcohol or over-the-counter pain medication other than plain Tylenol, limit the amount of water/fluid you drink to less than 6 cups (1500 cc) a day,  limit your sodium (salt) intake to less than 2 g (2000 mg) a day and weigh yourself daily at the same time and keeping your weight log.     Take care,   Increase activity slowly   Complete by:  As directed       Allergies as of 04/04/2021       Reactions   Other Hives   Yellow dye 6 FOOD COLOR YELLOW POWDER   Ace Inhibitors Cough   Amlodipine Itching   Aspirin    Atorvastatin Other (See Comments)   Codeine Other (See Comments)   Doesn't know   Conj Estrog-medroxyprogest Ace    PREMPRO 0.3-1.5 MG ORAL TABLET   Prednisone    Raloxifene    EVISTA 60 MG ORAL TABLET   Valsartan Itching        Medication List     STOP taking these medications    carvedilol 12.5 MG tablet Commonly known as: COREG   carvedilol 6.25 MG tablet Commonly known as: COREG   chlorthalidone 25 MG tablet Commonly known as: HYGROTON   vitamin E 180 MG (400 UNITS) capsule       TAKE these medications    acetaminophen 325 MG tablet Commonly known as: TYLENOL Take 325-650 mg by mouth every 6 (  six) hours as needed for moderate pain.   albuterol 108 (90 Base) MCG/ACT inhaler Commonly known as: VENTOLIN HFA Inhale 2 puffs into the lungs every 6 (six) hours as needed for wheezing or shortness of breath.   Dulera 200-5 MCG/ACT Aero Generic drug: mometasone-formoterol Inhale 2 puffs into the lungs 2 (two) times daily.   fluocinonide 0.05 % external solution Commonly known as: LIDEX Apply 1 application topically daily as needed (skin irritations).   fluticasone 50 MCG/ACT nasal spray Commonly known as: FLONASE Place 1 spray into both nostrils 2 (two) times daily as needed for allergies or rhinitis.   furosemide 40 MG tablet Commonly known as: Lasix Take 1 tablet (40 mg total) by mouth daily.   guaiFENesin 600 MG 12 hr tablet Commonly known as: MUCINEX Take 1 tablet (600 mg total) by mouth 2 (two) times daily.   hydrALAZINE 25 MG tablet Commonly known as: APRESOLINE Take 1 tablet (25 mg total) by mouth every 8 (eight) hours. What changed:  medication strength how much to take   HYDROcodone-acetaminophen 5-325 MG tablet Commonly known as: NORCO/VICODIN Take 1-2 tablets  by mouth every 6 (six) hours as needed for severe pain.   icosapent Ethyl 1 g capsule Commonly known as: VASCEPA Take 2 g by mouth 2 (two) times daily with a meal.   isosorbide mononitrate 30 MG 24 hr tablet Commonly known as: IMDUR Take 1 tablet (30 mg total) by mouth daily.   levothyroxine 112 MCG tablet Commonly known as: SYNTHROID Take 112 mcg by mouth daily.   Magnesium Oxide 250 MG Tabs Take 1-2 tablets by mouth daily as needed.   metoprolol succinate 50 MG 24 hr tablet Commonly known as: TOPROL-XL Take 1 tablet (50 mg total) by mouth daily. Take with or immediately following a meal. Start taking on: April 05, 2021   omeprazole 20 MG tablet Commonly known as: PriLOSEC OTC Take 2 tablets (40 mg total) by mouth daily.   predniSONE 20 MG tablet Commonly known as: DELTASONE Take 2 tablets (40 mg total) by mouth daily for 3 days.   telmisartan 40 MG tablet Commonly known as: MICARDIS Take 40 mg by mouth daily.   Vitamin D3 25 MCG (1000 UT) Caps Take 1,000 Units by mouth daily.        Consultations: Cardiology  Procedures/Studies: Left heart catheterization with clean coronaries.   DG Chest 2 View  Result Date: 04/01/2021 CLINICAL DATA:  Shortness of breath and cough. EXAM: CHEST - 2 VIEW COMPARISON:  December 21, 2020 FINDINGS: Mildly increased lung markings are noted. This is stable in appearance when compared to the prior study. There is no evidence of acute infiltrate, pleural effusion or pneumothorax. The cardiac silhouette is borderline in size. Moderate severity calcification of the aortic arch is noted. The visualized skeletal structures are unremarkable. IMPRESSION: Stable exam without active cardiopulmonary disease. Electronically Signed   By: Aram Candela M.D.   On: 04/01/2021 23:56   CARDIAC CATHETERIZATION  Result Date: 04/02/2021 85 year old female with known chronic systolic dysfunction congestive heart failure on appropriate medication  management having chest pain shortness of breath and elevated troponin Cardiac catheterization showing normal coronary arteries with 0% stenosis Echocardiogram showing global LV systolic dysfunction ejection of 35% Plan Continue medication management for acute on chronic systolic dysfunction congestive heart failure No further cardiac intervention at this time Cardiac rehabilitation  ECHOCARDIOGRAM COMPLETE  Result Date: 04/02/2021    ECHOCARDIOGRAM REPORT   Patient Name:   Andrea Phillips Date of Exam:  04/02/2021 Medical Rec #:  [id number]           Height:       60.0 in Accession #:    [id number]          Weight:       159.0 lb Date of Birth:  09-30-1935           BSA:          1.693 m Patient Age:    86 years            BP:           171/73 mmHg Patient Gender: F                   HR:           50 bpm. Exam Location:  [facility] Procedure: 2D Echo, Cardiac Doppler and Color Doppler Indications:     NSTEMI I21.4  History:         Patient has prior history of Echocardiogram examinations, most                  recent 04/08/2020. Signs/Symptoms:Shortness of Breath and                  Dyspnea.  Sonographer:     Cristela Blue RDCS (AE) Referring Phys:  Kenn File DOUTOVA Diagnosing Phys: Arnoldo Hooker MD  Sonographer Comments: No apical window, no subcostal window and Technically challenging study due to limited acoustic windows. IMPRESSIONS  1. Left ventricular ejection fraction, by estimation, is 25 to 30%. The left ventricle has severely decreased function. The left ventricle demonstrates global hypokinesis. The left ventricular internal cavity size was moderately dilated. Left ventricular diastolic parameters were normal.  2. Right ventricular systolic function is normal. The right ventricular size is normal.  3. The mitral valve is normal in structure. Mild to moderate mitral valve regurgitation.  4. Tricuspid valve regurgitation is mild to moderate.  5. The aortic valve is normal in structure. Aortic valve  regurgitation is not visualized. FINDINGS  Left Ventricle: Left ventricular ejection fraction, by estimation, is 25 to 30%. The left ventricle has severely decreased function. The left ventricle demonstrates global hypokinesis. The left ventricular internal cavity size was moderately dilated. There is no left ventricular hypertrophy. Left ventricular diastolic parameters were normal. Right Ventricle: The right ventricular size is normal. No increase in right ventricular wall thickness. Right ventricular systolic function is normal. Left Atrium: Left atrial size was normal in size. Right Atrium: Right atrial size was normal in size. Pericardium: There is no evidence of pericardial effusion. Mitral Valve: The mitral valve is normal in structure. Mild to moderate mitral valve regurgitation. Tricuspid Valve: The tricuspid valve is normal in structure. Tricuspid valve regurgitation is mild to moderate. Aortic Valve: The aortic valve is normal in structure. Aortic valve regurgitation is not visualized. Pulmonic Valve: The pulmonic valve was normal in structure. Pulmonic valve regurgitation is trivial. Aorta: The aortic root and ascending aorta are structurally normal, with no evidence of dilitation. IAS/Shunts: No atrial level shunt detected by color flow Doppler.  LEFT VENTRICLE PLAX 2D LVIDd:         5.08 cm LVIDs:         4.15 cm LV PW:         0.94 cm LV IVS:        0.68 cm LVOT diam:     2.00 cm LVOT Area:     3.14 cm  LEFT ATRIUM         Index LA diam:    3.80 cm 2.24 cm/m                        PULMONIC VALVE AORTA                 PV Vmax:        0.70 m/s Ao Root diam: 2.60 cm PV Peak grad:   1.9 mmHg                       RVOT Peak grad: 2 mmHg   SHUNTS Systemic Diam: 2.00 cm Arnoldo Hooker MD Electronically signed by Arnoldo Hooker MD Signature Date/Time: 04/02/2021/3:33:24 PM    Final        The results of significant diagnostics from this hospitalization (including imaging, microbiology, ancillary and  laboratory) are listed below for reference.     Microbiology: Recent Results (from the past 240 hour(s))  SARS CORONAVIRUS 2 (TAT 6-24 HRS) Nasopharyngeal Nasopharyngeal Swab     Status: None   Collection Time: 04/01/21 10:53 PM   Specimen: Nasopharyngeal Swab  Result Value Ref Range Status   SARS Coronavirus 2 NEGATIVE NEGATIVE Final    Comment: (NOTE) SARS-CoV-2 target nucleic acids are NOT DETECTED.  The SARS-CoV-2 RNA is generally detectable in upper and lower respiratory specimens during the acute phase of infection. Negative results do not preclude SARS-CoV-2 infection, do not rule out co-infections with other pathogens, and should not be used as the sole basis for treatment or other patient management decisions. Negative results must be combined with clinical observations, patient history, and epidemiological information. The expected result is Negative.  Fact Sheet for Patients: HairSlick.no  Fact Sheet for Healthcare Providers: quierodirigir.com  This test is not yet approved or cleared by the [place] FDA and  has been authorized for detection and/or diagnosis of SARS-CoV-2 by FDA under an Emergency Use Authorization (EUA). This EUA will remain  in effect (meaning this test can be used) for the duration of the COVID-19 declaration under Se ction 564(b)(1) of the Act, 21 U.S.C. section 360bbb-3(b)(1), unless the authorization is terminated or revoked sooner.  Performed at [facility], [address] [place], [place] [address]   Urine Culture     Status: Abnormal   Collection Time: 04/02/21  3:01 AM   Specimen: Urine, Random  Result Value Ref Range Status   Specimen Description   Final    URINE, RANDOM Performed at [facility], [address] [place], [place] [address]    Special Requests   Final    NONE Performed at [facility], [address] [place], [place]  [address]    Culture MULTIPLE SPECIES PRESENT, SUGGEST RECOLLECTION (A)  Final   Report Status 04/03/2021 FINAL  Final     Labs:  CBC: Recent Labs  Lab 04/01/21 2246 04/02/21 0450 04/03/21 0436  WBC 6.2 5.6 7.3  NEUTROABS 4.5 4.9  --   HGB 15.5* 16.3* 15.5*  HCT 44.0 46.7* 44.7  MCV 86.3 85.8 85.8  PLT 214 214 264   BMP &GFR Recent Labs  Lab 04/01/21 2246 04/02/21 0058 04/02/21 0450 04/03/21 0436 04/04/21 0415  NA 139  --  139 138 140  K 4.3  --  4.2 3.6 4.0  CL 105  --  101 102 103  CO2 25  --  GLUCOSE 126*  --  176* 125* 150*  BUN 15  --  CREATININE 1.02*  --  0.84 0.83 0.65  CALCIUM 9.6  --  9.6 9.4 9.5  MG  --  1.9 1.9 2.0 2.2  PHOS  --   --  3.3 3.3 3.3   Estimated Creatinine Clearance: 46.5 mL/min (by C-G formula based on SCr of 0.65 mg/dL). Liver & Pancreas: Recent Labs  Lab 04/01/21 2246 04/02/21 0450 04/03/21 0436 04/04/21 0415  AST 20 21  --   --   ALT 14 15  --   --   ALKPHOS 46 44  --   --   BILITOT 0.9 0.8  --   --   PROT 7.1 7.7  --   --   ALBUMIN 3.6 3.9 3.2* 3.4*   No results for input(s): LIPASE, AMYLASE in the last 168 hours. No results for input(s): AMMONIA in the last 168 hours. Diabetic: No results for input(s): HGBA1C in the last 72 hours. No results for input(s): GLUCAP in the last 168 hours. Cardiac Enzymes: Recent Labs  Lab 04/02/21 0058  CKTOTAL 113   No results for input(s): PROBNP in the last 8760 hours. Coagulation Profile: Recent Labs  Lab 04/02/21 0058  INR 1.0   Thyroid Function Tests: Recent Labs    04/02/21 0450  TSH 0.665   Lipid Profile: Recent Labs    04/02/21 0450  CHOL 170  HDL 46  LDLCALC 112*  TRIG 60  CHOLHDL 3.7   Anemia Panel: Recent Labs    04/01/21 2246  FERRITIN 302   Urine analysis:    Component Value Date/Time   COLORURINE COLORLESS (A) 04/02/2021 0301   APPEARANCEUR CLEAR (A) 04/02/2021 0301   LABSPEC 1.004 (L) 04/02/2021 0301   PHURINE 5.0 04/02/2021  0301   GLUCOSEU NEGATIVE 04/02/2021 0301   HGBUR NEGATIVE 04/02/2021 0301   BILIRUBINUR NEGATIVE 04/02/2021 0301   KETONESUR NEGATIVE 04/02/2021 0301   PROTEINUR NEGATIVE 04/02/2021 0301   NITRITE NEGATIVE 04/02/2021 0301   LEUKOCYTESUR NEGATIVE 04/02/2021 0301   Sepsis Labs: Invalid input(s): PROCALCITONIN, LACTICIDVEN   Time coordinating discharge: 40 minutes  SIGNED:  Almon Hercules, MD  [facility] 04/04/2021, 10:28 AM  If 7PM-7AM, please contact night-coverage [email or web address]

## 2021-04-04 NOTE — Progress Notes (Signed)
O2 nose cannula removed

## 2021-04-04 NOTE — TOC Transition Note (Signed)
Transition of Care [facility]) - CM/SW Discharge Note   Patient Details  Name: Andrea Phillips MRN: [id number] Date of Birth: [birth year]  [facility] CM/SW Contact:  Bing Quarry, RN Phone Number: 04/04/2021, 1:47 PM   Clinical Narrative:   Patient discharged today before [facility] orders could be secured. [facility] has accepted and will contact patient at home. Home Health orders in chart. Gabriel Cirri RN CM    Final next level of care: Home w Home Health Services     Patient Goals and CMS Choice        Discharge Placement                       Discharge Plan and Services                DME Arranged: N/A DME Agency: NA       HH Arranged: PT HH Agency: [facility] Date HH Agency Contacted: 04/04/21 Time HH Agency Contacted: 1346 Representative spoke with at [facility] Agency: Werner Lean  Social Determinants of Health (SDOH) Interventions     Readmission Risk Interventions No flowsheet data found.

## 2021-04-21 NOTE — Progress Notes (Signed)
Patient ID: Andrea Phillips, female    DOB: [birth year], 86 y.o.   MRN: [id number]  HPI  Andrea Phillips is a 86 y/o female with a history of asthma, HTN, thyroid disease, GERD, gout, sleep apnea & chronic heart failure.   Echo report from 04/02/21 reviewed and showed an EF of 25-30% along with mild/moderate MR/TR.   LHC 04/02/21 showed: Cardiac catheterization showing normal coronary arteries with 0% stenosis Echocardiogram showing global LV systolic dysfunction ejection of 35%  Admitted 04/01/21 due to acute on chronic HF along with NSTEMI. Cardiology consult obtained. Given IV heparin along with IV lasix. Cath done which showed clean arteries. Diuretics changed to oral. Given steroids and breathing treatments for asthma exacerbation. Able to be weaned off of oxygen. Discharged after 3 days.   She presents today for her initial visit with a chief complaint of moderate fatigue upon minimal exertion. She describes this as chronic in nature having been present for several months. She has associated cough, shortness of breath, rare chest pain and dizziness along with this. She denies any difficulty sleeping, palpitations, abdominal distention, pedal edema or weight gain.   She says that she feels like she overdid it yesterday and noted difficulty sleeping last night with increased shortness of breath. Feels better this morning.   Past Medical History:  Diagnosis Date   Arthritis    ra   Asthma    CHF (congestive heart failure) (HCC)    Dysrhythmia    Edema extremities    GERD (gastroesophageal reflux disease)    Gout    Headache    History of hiatal hernia    Hypertension    Hypothyroidism    Shortness of breath dyspnea    Sleep apnea    Past Surgical History:  Procedure Laterality Date   APPENDECTOMY     BACK SURGERY     CARDIAC CATHETERIZATION     CATARACT EXTRACTION W/PHACO Left 03/09/2015   Procedure: CATARACT EXTRACTION PHACO AND INTRAOCULAR LENS PLACEMENT (IOC);  Surgeon:  Sallee Lange, MD;  Location: [facility] ORS;  Service: Ophthalmology;  Laterality: Left;  [place] 01:31 AP% 26.1 CDE 43.72   CHOLECYSTECTOMY     EYE SURGERY     retina   JOINT REPLACEMENT     left knee   LEFT HEART CATH AND CORONARY ANGIOGRAPHY N/A 04/02/2021   Procedure: LEFT HEART CATH AND CORONARY ANGIOGRAPHY;  Surgeon: Lamar Blinks, MD;  Location: [facility];  Service: Cardiovascular;  Laterality: N/A;   Family History  Problem Relation Age of Onset   Breast cancer Cousin    Social History   Tobacco Use   Smoking status: Never   Smokeless tobacco: Never  Substance Use Topics   Alcohol use: No   Allergies  Allergen Reactions   Other Hives    Yellow dye 6 FOOD COLOR YELLOW POWDER    Ace Inhibitors Cough   Amlodipine Itching   Aspirin    Atorvastatin Other (See Comments)   Codeine Other (See Comments)    Doesn't know   Conj Estrog-Medroxyprogest Ace     PREMPRO 0.3-1.5 MG ORAL TABLET   Prednisone    Raloxifene     EVISTA 60 MG ORAL TABLET   Valsartan Itching   Prior to Admission medications   Medication Sig Start Date End Date Taking? Authorizing Provider  acetaminophen (TYLENOL) 325 MG tablet Take 325-650 mg by mouth every 6 (six) hours as needed for moderate pain.   Yes [provider]  albuterol (VENTOLIN HFA) 108 (90 Base) MCG/ACT inhaler Inhale 2 puffs into the lungs every 6 (six) hours as needed for wheezing or shortness of breath.   Yes [provider]  Cholecalciferol (VITAMIN D3) 1000 units CAPS Take 1,000 Units by mouth daily.   Yes [provider]  fluocinonide (LIDEX) 0.05 % external solution Apply 1 application topically daily as needed (skin irritations).   Yes [provider]  fluticasone (FLONASE) 50 MCG/ACT nasal spray Place 1 spray into both nostrils 2 (two) times daily as needed for allergies or rhinitis.   Yes [provider]  furosemide (LASIX) 40 MG tablet Take 1 tablet (40 mg total) by mouth  daily. Patient taking differently: Take 20 mg by mouth daily. 04/03/21 04/03/22 Yes Almon Hercules, MD  guaiFENesin (MUCINEX) 600 MG 12 hr tablet Take 1 tablet (600 mg total) by mouth 2 (two) times daily. 04/04/21  Yes Almon Hercules, MD  levothyroxine (SYNTHROID) 112 MCG tablet Take 112 mcg by mouth daily. 02/23/21  Yes [provider]  Magnesium Oxide 250 MG TABS Take 1-2 tablets by mouth daily as needed.   Yes [provider]  metoprolol succinate (TOPROL-XL) 50 MG 24 hr tablet Take 1 tablet (50 mg total) by mouth daily. Take with or immediately following a meal. Patient taking differently: Take 25 mg by mouth daily. Take with or immediately following a meal. 04/05/21  Yes Gonfa, Boyce Medici, MD  mometasone-formoterol (DULERA) 200-5 MCG/ACT AERO Inhale 2 puffs into the lungs 2 (two) times daily. 04/04/21  Yes Almon Hercules, MD  omeprazole (PRILOSEC OTC) 20 MG tablet Take 2 tablets (40 mg total) by mouth daily. 04/04/21  Yes Almon Hercules, MD  telmisartan (MICARDIS) 40 MG tablet Take 40 mg by mouth daily. 03/06/21  Yes [provider]  HYDROcodone-acetaminophen (NORCO/VICODIN) 5-325 MG tablet Take 1-2 tablets by mouth every 6 (six) hours as needed for severe pain. Patient not taking: No sig reported 04/09/20   Glade Lloyd, MD  icosapent Ethyl (VASCEPA) 1 g capsule Take 2 g by mouth 2 (two) times daily with a meal.    [provider]    Review of Systems  Constitutional:  Positive for fatigue (tire easily). Negative for appetite change.  HENT:  Negative for congestion, postnasal drip and sore throat.   Eyes: Negative.   Respiratory:  Positive for cough (at times) and shortness of breath (minimal).   Cardiovascular:  Positive for chest pain (rarely). Negative for palpitations and leg swelling.  Gastrointestinal:  Negative for abdominal distention and abdominal pain.  Endocrine: Negative.   Genitourinary: Negative.   Musculoskeletal:  Negative for back pain and neck pain.   Skin: Negative.   Allergic/Immunologic: Negative.   Neurological:  Positive for dizziness (at times) and light-headedness (at times).  Hematological:  Negative for adenopathy. Does not bruise/bleed easily.  Psychiatric/Behavioral:  Negative for dysphoric mood and sleep disturbance (sleeping on 1 pillow with CPAP). The patient is not nervous/anxious.    Vitals:   04/22/21 1011  BP: (!) 184/68  Pulse: (!) 57  Resp: 18  SpO2: 98%  Weight: 162 lb 6 oz (73.7 kg)  Height: 5' (1.524 m)   Wt Readings from Last 3 Encounters:  04/22/21 162 lb 6 oz (73.7 kg)  04/04/21 160 lb 1.6 oz (72.6 kg)  12/21/20 162 lb (73.5 kg)   Lab Results  Component Value Date   CREATININE 0.65 04/04/2021   CREATININE 0.83 04/03/2021   CREATININE 0.84 04/02/2021  Physical Exam Vitals reviewed.  Constitutional:      Appearance: Normal appearance.  HENT:     Head: Normocephalic and atraumatic.  Cardiovascular:     Rate and Rhythm: Regular rhythm. Bradycardia present.  Pulmonary:     Effort: Pulmonary effort is normal. No respiratory distress.     Breath sounds: No wheezing or rales.  Abdominal:     General: There is no distension.     Palpations: Abdomen is soft.     Tenderness: There is no abdominal tenderness.  Musculoskeletal:        General: No tenderness.     Cervical back: Normal range of motion and neck supple.     Right lower leg: No edema.     Left lower leg: No edema.  Skin:    General: Skin is warm and dry.  Neurological:     General: No focal deficit present.     Mental Status: She is alert and oriented to person, place, and time.  Psychiatric:        Mood and Affect: Mood normal.        Behavior: Behavior normal.        Thought Content: Thought content normal.    Assessment & Plan:  1: Chronic heart failure with reduced ejection fraction- - NYHA class III - euvolemic today - weighing daily and home weight chart reviewed; weight ranges from 156-159 over the last week;  instructed to call for an overnight weight gain of > 2 pounds or a weekly weight gain of > 5 pounds - not adding salt to her food - saw cardiology Gwen Pounds) 04/15/21; returns October - on GDMT of metoprolol and telmisartan - discussed changing her telmisartan to entresto, adding spironolactone or SGLT2 - she's been taking 1/2 tablet of her furosemide but is planning on going back to a whole tablet; discussed possibly changing her diuretic to torsemide as she feels like her swelling tends to occur in her abdomen and not her legs - will defer right now as she's recently had some medication changes - BNP 04/01/21 was 559.2  2: HTN- - BP elevated here today (184/68); checks her BP at home and it fluctuates from 107-191/57-107 - to see PCP Valrie Hart) sometime in July - BMP 04/04/21 reviewed and showed sodium 140, potassium 4.0, creatinine 0.65 and GFR >60  3: Sleep apnea- - saw pulmonology Meredeth Ide) 01/27/21 - wear CPAP nightly   Medication list reviewed. Patient has multiple allergies  Return in 6 weeks or sooner for any questions/problems before then.

## 2021-04-22 ENCOUNTER — Ambulatory Visit: Payer: Medicare Other | Attending: Family | Admitting: Family

## 2021-04-22 ENCOUNTER — Other Ambulatory Visit: Payer: Self-pay

## 2021-04-22 ENCOUNTER — Encounter: Payer: Self-pay | Admitting: Family

## 2021-04-22 VITALS — BP 184/68 | HR 57 | Resp 18 | Ht 60.0 in | Wt 162.4 lb

## 2021-04-22 DIAGNOSIS — Z79899 Other long term (current) drug therapy: Secondary | ICD-10-CM | POA: Insufficient documentation

## 2021-04-22 DIAGNOSIS — K219 Gastro-esophageal reflux disease without esophagitis: Secondary | ICD-10-CM | POA: Insufficient documentation

## 2021-04-22 DIAGNOSIS — R0602 Shortness of breath: Secondary | ICD-10-CM | POA: Insufficient documentation

## 2021-04-22 DIAGNOSIS — I110 Hypertensive heart disease with heart failure: Secondary | ICD-10-CM | POA: Diagnosis not present

## 2021-04-22 DIAGNOSIS — R5383 Other fatigue: Secondary | ICD-10-CM | POA: Insufficient documentation

## 2021-04-22 DIAGNOSIS — E039 Hypothyroidism, unspecified: Secondary | ICD-10-CM | POA: Diagnosis not present

## 2021-04-22 DIAGNOSIS — J45901 Unspecified asthma with (acute) exacerbation: Secondary | ICD-10-CM | POA: Insufficient documentation

## 2021-04-22 DIAGNOSIS — Z886 Allergy status to analgesic agent status: Secondary | ICD-10-CM | POA: Diagnosis not present

## 2021-04-22 DIAGNOSIS — G4733 Obstructive sleep apnea (adult) (pediatric): Secondary | ICD-10-CM

## 2021-04-22 DIAGNOSIS — R0789 Other chest pain: Secondary | ICD-10-CM | POA: Diagnosis not present

## 2021-04-22 DIAGNOSIS — I252 Old myocardial infarction: Secondary | ICD-10-CM | POA: Insufficient documentation

## 2021-04-22 DIAGNOSIS — Z7901 Long term (current) use of anticoagulants: Secondary | ICD-10-CM | POA: Insufficient documentation

## 2021-04-22 DIAGNOSIS — I5022 Chronic systolic (congestive) heart failure: Secondary | ICD-10-CM | POA: Insufficient documentation

## 2021-04-22 DIAGNOSIS — R42 Dizziness and giddiness: Secondary | ICD-10-CM | POA: Diagnosis not present

## 2021-04-22 DIAGNOSIS — Z7951 Long term (current) use of inhaled steroids: Secondary | ICD-10-CM | POA: Insufficient documentation

## 2021-04-22 DIAGNOSIS — I5020 Unspecified systolic (congestive) heart failure: Secondary | ICD-10-CM

## 2021-04-22 DIAGNOSIS — I10 Essential (primary) hypertension: Secondary | ICD-10-CM

## 2021-04-22 DIAGNOSIS — G4730 Sleep apnea, unspecified: Secondary | ICD-10-CM | POA: Insufficient documentation

## 2021-04-22 NOTE — Patient Instructions (Signed)
Continue weighing daily and call for an overnight weight gain of > 2 pounds or a weekly weight gain of >5 pounds. 

## 2021-05-07 ENCOUNTER — Encounter: Payer: Medicare Other | Attending: Internal Medicine | Admitting: *Deleted

## 2021-05-07 ENCOUNTER — Other Ambulatory Visit: Payer: Self-pay

## 2021-05-07 DIAGNOSIS — I252 Old myocardial infarction: Secondary | ICD-10-CM | POA: Insufficient documentation

## 2021-05-07 DIAGNOSIS — I214 Non-ST elevation (NSTEMI) myocardial infarction: Secondary | ICD-10-CM

## 2021-05-07 NOTE — Progress Notes (Signed)
Initial telephone orientation completed. Diagnosis can be found in [facility] 6/9. EP orientation scheduled for Tuesday 7/25 at 10am

## 2021-05-18 ENCOUNTER — Other Ambulatory Visit: Payer: Self-pay

## 2021-05-18 VITALS — Ht 61.5 in | Wt 162.9 lb

## 2021-05-18 DIAGNOSIS — I214 Non-ST elevation (NSTEMI) myocardial infarction: Secondary | ICD-10-CM

## 2021-05-18 DIAGNOSIS — I252 Old myocardial infarction: Secondary | ICD-10-CM | POA: Diagnosis present

## 2021-05-18 NOTE — Progress Notes (Signed)
Cardiac Individual Treatment Plan  Patient Details  Name: Andrea Phillips MRN: [id number] Date of Birth: 15-Jun-1935 Referring Provider:   Flowsheet Row Cardiac Rehab from 05/18/2021 in [facility] Cardiac and Pulmonary Rehab  Referring Provider Arnoldo Hooker MD       Initial Encounter Date:  Flowsheet Row Cardiac Rehab from 05/18/2021 in [facility] Cardiac and Pulmonary Rehab  Date 05/18/21       Visit Diagnosis: NSTEMI (non-ST elevated myocardial infarction) [facility])  Patient's Home Medications on Admission:  Current Outpatient Medications:    acetaminophen (TYLENOL) 325 MG tablet, Take 325-650 mg by mouth every 6 (six) hours as needed for moderate pain., Disp: , Rfl:    albuterol (VENTOLIN HFA) 108 (90 Base) MCG/ACT inhaler, Inhale 2 puffs into the lungs every 6 (six) hours as needed for wheezing or shortness of breath., Disp: , Rfl:    Cholecalciferol (VITAMIN D3) 1000 units CAPS, Take 1,000 Units by mouth daily., Disp: , Rfl:    fluocinonide (LIDEX) 0.05 % external solution, Apply 1 application topically daily as needed (skin irritations)., Disp: , Rfl:    fluticasone (FLONASE) 50 MCG/ACT nasal spray, Place 1 spray into both nostrils 2 (two) times daily as needed for allergies or rhinitis., Disp: , Rfl:    furosemide (LASIX) 40 MG tablet, Take 1 tablet (40 mg total) by mouth daily. (Patient taking differently: Take 20 mg by mouth daily.), Disp: 90 tablet, Rfl: 3   guaiFENesin (MUCINEX) 600 MG 12 hr tablet, Take 1 tablet (600 mg total) by mouth 2 (two) times daily., Disp: , Rfl:    HYDROcodone-acetaminophen (NORCO/VICODIN) 5-325 MG tablet, Take 1-2 tablets by mouth every 6 (six) hours as needed for severe pain. (Patient not taking: No sig reported), Disp: 14 tablet, Rfl: 0   icosapent Ethyl (VASCEPA) 1 g capsule, Take 2 g by mouth 2 (two) times daily with a meal., Disp: , Rfl:    levothyroxine (SYNTHROID) 112 MCG tablet, Take 112 mcg by mouth daily., Disp: , Rfl:    Magnesium Oxide 250 MG  TABS, Take 1-2 tablets by mouth daily as needed., Disp: , Rfl:    metoprolol succinate (TOPROL-XL) 50 MG 24 hr tablet, Take 1 tablet (50 mg total) by mouth daily. Take with or immediately following a meal. (Patient taking differently: Take 25 mg by mouth daily. Take with or immediately following a meal.), Disp: 90 tablet, Rfl: 1   mometasone-formoterol (DULERA) 200-5 MCG/ACT AERO, Inhale 2 puffs into the lungs 2 (two) times daily., Disp: 1 each, Rfl: 1   omeprazole (PRILOSEC OTC) 20 MG tablet, Take 2 tablets (40 mg total) by mouth daily., Disp: 5 tablet, Rfl: 0   telmisartan (MICARDIS) 40 MG tablet, Take 40 mg by mouth daily., Disp: , Rfl:   Past Medical History: Past Medical History:  Diagnosis Date   Arthritis    ra   Asthma    CHF (congestive heart failure) (HCC)    Dysrhythmia    Edema extremities    GERD (gastroesophageal reflux disease)    Gout    Headache    History of hiatal hernia    Hypertension    Hypothyroidism    Shortness of breath dyspnea    Sleep apnea     Tobacco Use: Social History   Tobacco Use  Smoking Status Never  Smokeless Tobacco Never    Labs: Recent Review Flowsheet Data     Labs for ITP Cardiac and Pulmonary Rehab Latest Ref Rng & Units 04/02/2021   Cholestrol 0 - 200  mg/dL 798   LDLCALC 0 - 99 mg/dL 921(J)   HDL >94 mg/dL 46   Trlycerides <174 mg/dL 60        Exercise Target Goals: Exercise Program Goal: Individual exercise prescription set using results from initial 6 min walk test and THRR while considering  patient's activity barriers and safety.   Exercise Prescription Goal: Initial exercise prescription builds to 30-45 minutes a day of aerobic activity, 2-3 days per week.  Home exercise guidelines will be given to patient during program as part of exercise prescription that the participant will acknowledge.   Education: Aerobic Exercise: - Group verbal and visual presentation on the components of exercise prescription. Introduces  F.I.T.T principle from ACSM for exercise prescriptions.  Reviews F.I.T.T. principles of aerobic exercise including progression. Written material given at graduation.   Education: Resistance Exercise: - Group verbal and visual presentation on the components of exercise prescription. Introduces F.I.T.T principle from ACSM for exercise prescriptions  Reviews F.I.T.T. principles of resistance exercise including progression. Written material given at graduation.    Education: Exercise & Equipment Safety: - Individual verbal instruction and demonstration of equipment use and safety with use of the equipment. Flowsheet Row Cardiac Rehab from 05/18/2021 in [facility] Cardiac and Pulmonary Rehab  Education need identified 05/18/21  Date 05/18/21  Educator KL  Instruction Review Code 1- Verbalizes Understanding       Education: Exercise Physiology & General Exercise Guidelines: - Group verbal and written instruction with models to review the exercise physiology of the cardiovascular system and associated critical values. Provides general exercise guidelines with specific guidelines to those with heart or lung disease.    Education: Flexibility, Balance, Mind/Body Relaxation: - Group verbal and visual presentation with interactive activity on the components of exercise prescription. Introduces F.I.T.T principle from ACSM for exercise prescriptions. Reviews F.I.T.T. principles of flexibility and balance exercise training including progression. Also discusses the mind body connection.  Reviews various relaxation techniques to help reduce and manage stress (i.e. Deep breathing, progressive muscle relaxation, and visualization). Balance handout provided to take home. Written material given at graduation.   Activity Barriers & Risk Stratification:  Activity Barriers & Cardiac Risk Stratification - 05/18/21 1152       Activity Barriers & Cardiac Risk Stratification   Activity Barriers Left Knee  Replacement;Deconditioning;Balance Concerns;Muscular Weakness    Cardiac Risk Stratification Moderate             6 Minute Walk:  6 Minute Walk     Row Name 05/18/21 1155         6 Minute Walk   Phase Initial     Distance 1000 feet     Walk Time 6 minutes     # of Rest Breaks 0     MPH 1.89     METS 1.73     RPE 11     Perceived Dyspnea  1     VO2 Peak 6.06     Symptoms Yes (comment)     Comments Fatigued, slight dizziness- resolved with rest     Resting HR 62 bpm     Resting BP 140/74     Resting Oxygen Saturation  96 %     Exercise Oxygen Saturation  during 6 min walk 96 %     Max Ex. HR 99 bpm     Max Ex. BP 182/70  RCK 150/72     2 Minute Post BP 140/72  Oxygen Initial Assessment:   Oxygen Re-Evaluation:   Oxygen Discharge (Final Oxygen Re-Evaluation):   Initial Exercise Prescription:  Initial Exercise Prescription - 05/18/21 1500       Date of Initial Exercise RX and Referring Provider   Date 05/18/21    Referring Provider Arnoldo Hooker MD      NuStep   Level 1    SPM 80    Minutes 15    METs 1.7      Arm Ergometer   Level 1    RPM 30    Minutes 15    METs 1.7      Biostep-RELP   Level 1    SPM 50    Minutes 15    METs 1.7      Track   Laps 16    Minutes 15    METs 1.87      Prescription Details   Frequency (times per week) 3    Duration Progress to 30 minutes of continuous aerobic without signs/symptoms of physical distress      Intensity   THRR 40-80% of Max Heartrate 91-121    Ratings of Perceived Exertion 11-13    Perceived Dyspnea 0-4      Progression   Progression Continue to progress workloads to maintain intensity without signs/symptoms of physical distress.      Resistance Training   Training Prescription Yes    Weight 3 lb    Reps 10-15             Perform Capillary Blood Glucose checks as needed.  Exercise Prescription Changes:   Exercise Prescription Changes     Row Name  05/18/21 1500             Response to Exercise   Blood Pressure (Admit) 140/74       Blood Pressure (Exercise) 182/70       Blood Pressure (Exit) 140/72       Heart Rate (Admit) 62 bpm       Heart Rate (Exercise) 99 bpm       Heart Rate (Exit) 66 bpm       Oxygen Saturation (Admit) 96 %       Oxygen Saturation (Exercise) 96 %       Oxygen Saturation (Exit) 96 %       Rating of Perceived Exertion (Exercise) 11       Perceived Dyspnea (Exercise) 1       Symptoms fatigue, slight dizziness       Comments walk test results                Exercise Comments:   Exercise Goals and Review:   Exercise Goals     Row Name 05/18/21 1523             Exercise Goals   Increase Physical Activity Yes       Intervention Provide advice, education, support and counseling about physical activity/exercise needs.;Develop an individualized exercise prescription for aerobic and resistive training based on initial evaluation findings, risk stratification, comorbidities and participant's personal goals.       Expected Outcomes Short Term: Attend rehab on a regular basis to increase amount of physical activity.;Long Term: Add in home exercise to make exercise part of routine and to increase amount of physical activity.;Long Term: Exercising regularly at least 3-5 days a week.       Increase Strength and Stamina Yes       Intervention Provide advice, education, support  and counseling about physical activity/exercise needs.;Develop an individualized exercise prescription for aerobic and resistive training based on initial evaluation findings, risk stratification, comorbidities and participant's personal goals.       Expected Outcomes Short Term: Increase workloads from initial exercise prescription for resistance, speed, and METs.;Short Term: Perform resistance training exercises routinely during rehab and add in resistance training at home;Long Term: Improve cardiorespiratory fitness, muscular  endurance and strength as measured by increased METs and functional capacity ( )       Able to understand and use rate of perceived exertion (RPE) scale Yes       Intervention Provide education and explanation on how to use RPE scale       Expected Outcomes Short Term: Able to use RPE daily in rehab to express subjective intensity level;Long Term:  Able to use RPE to guide intensity level when exercising independently       Able to understand and use Dyspnea scale Yes       Intervention Provide education and explanation on how to use Dyspnea scale       Expected Outcomes Short Term: Able to use Dyspnea scale daily in rehab to express subjective sense of shortness of breath during exertion;Long Term: Able to use Dyspnea scale to guide intensity level when exercising independently       Knowledge and understanding of Target Heart Rate Range (THRR) Yes       Intervention Provide education and explanation of THRR including how the numbers were predicted and where they are located for reference       Expected Outcomes Short Term: Able to state/look up THRR;Short Term: Able to use daily as guideline for intensity in rehab;Long Term: Able to use THRR to govern intensity when exercising independently       Able to check pulse independently Yes       Intervention Review the importance of being able to check your own pulse for safety during independent exercise;Provide education and demonstration on how to check pulse in carotid and radial arteries.       Expected Outcomes Short Term: Able to explain why pulse checking is important during independent exercise;Long Term: Able to check pulse independently and accurately       Understanding of Exercise Prescription Yes       Intervention Provide education, explanation, and written materials on patient's individual exercise prescription       Expected Outcomes Short Term: Able to explain program exercise prescription;Long Term: Able to explain home exercise  prescription to exercise independently                Exercise Goals Re-Evaluation :   Discharge Exercise Prescription (Final Exercise Prescription Changes):  Exercise Prescription Changes - 05/18/21 1500       Response to Exercise   Blood Pressure (Admit) 140/74    Blood Pressure (Exercise) 182/70    Blood Pressure (Exit) 140/72    Heart Rate (Admit) 62 bpm    Heart Rate (Exercise) 99 bpm    Heart Rate (Exit) 66 bpm    Oxygen Saturation (Admit) 96 %    Oxygen Saturation (Exercise) 96 %    Oxygen Saturation (Exit) 96 %    Rating of Perceived Exertion (Exercise) 11    Perceived Dyspnea (Exercise) 1    Symptoms fatigue, slight dizziness    Comments walk test results             Nutrition:  Target Goals: Understanding of nutrition guidelines, daily intake of  sodium 1500mg , cholesterol 200mg , calories 30% from fat and 7% or less from saturated fats, daily to have 5 or more servings of fruits and vegetables.  Education: All About Nutrition: -Group instruction provided by verbal, written material, interactive activities, discussions, models, and posters to present general guidelines for heart healthy nutrition including fat, fiber, MyPlate, the role of sodium in heart healthy nutrition, utilization of the nutrition label, and utilization of this knowledge for meal planning. Follow up email sent as well. Written material given at graduation.   Biometrics:  Pre Biometrics - 05/18/21 1151       Pre Biometrics   Height 5' 1.5" (1.562 m)    Weight 162 lb 14.4 oz (73.9 kg)    BMI (Calculated) 30.29    Single Leg Stand 1.18 seconds              Nutrition Therapy Plan and Nutrition Goals:   Nutrition Assessments:  MEDIFICTS Score Key: ?70 Need to make dietary changes  40-70 Heart Healthy Diet ? 40 Therapeutic Level Cholesterol Diet  Flowsheet Row Cardiac Rehab from 05/18/2021 in [facility] Cardiac and Pulmonary Rehab  Picture Your Plate Total Score on Admission 54       Picture Your Plate Scores: <16 Unhealthy dietary pattern with much room for improvement. 41-50 Dietary pattern unlikely to meet recommendations for good health and room for improvement. 51-60 More healthful dietary pattern, with some room for improvement.  >60 Healthy dietary pattern, although there may be some specific behaviors that could be improved.    Nutrition Goals Re-Evaluation:   Nutrition Goals Discharge (Final Nutrition Goals Re-Evaluation):   Psychosocial: Target Goals: Acknowledge presence or absence of significant depression and/or stress, maximize coping skills, provide positive support system. Participant is able to verbalize types and ability to use techniques and skills needed for reducing stress and depression.   Education: Stress, Anxiety, and Depression - Group verbal and visual presentation to define topics covered.  Reviews how body is impacted by stress, anxiety, and depression.  Also discusses healthy ways to reduce stress and to treat/manage anxiety and depression.  Written material given at graduation.   Education: Sleep Hygiene -Provides group verbal and written instruction about how sleep can affect your health.  Define sleep hygiene, discuss sleep cycles and impact of sleep habits. Review good sleep hygiene tips.    Initial Review & Psychosocial Screening:  Initial Psych Review & Screening - 05/07/21 1411       Initial Review   Current issues with None Identified      Family Dynamics   Good Support System? Yes   grandchildren     Barriers   Psychosocial barriers to participate in program There are no identifiable barriers or psychosocial needs.;The patient should benefit from training in stress management and relaxation.      Screening Interventions   Interventions Encouraged to exercise;Provide feedback about the scores to participant;To provide support and resources with identified psychosocial needs    Expected Outcomes Short Term goal:  Utilizing psychosocial counselor, staff and physician to assist with identification of specific Stressors or current issues interfering with healing process. Setting desired goal for each stressor or current issue identified.;Long Term Goal: Stressors or current issues are controlled or eliminated.;Long Term goal: The participant improves quality of Life and PHQ9 Scores as seen by post scores and/or verbalization of changes;Short Term goal: Identification and review with participant of any Quality of Life or Depression concerns found by scoring the questionnaire.  Quality of Life Scores:   Quality of Life - 05/18/21 1520       Quality of Life   Select Quality of Life      Quality of Life Scores   Health/Function Pre 20 %    Socioeconomic Pre 24.1 %    Psych/Spiritual Pre 25 %    Family Pre 24 %    GLOBAL Pre 22.31 %            Scores of 19 and below usually indicate a poorer quality of life in these areas.  A difference of  2-3 points is a clinically meaningful difference.  A difference of 2-3 points in the total score of the Quality of Life Index has been associated with significant improvement in overall quality of life, self-image, physical symptoms, and general health in studies assessing change in quality of life.  PHQ-9: Recent Review Flowsheet Data     Depression screen [facility] 2/9 05/18/2021   Decreased Interest 1   Down, Depressed, Hopeless 1   PHQ - 2 Score 2   Altered sleeping 0   Tired, decreased energy 1   Change in appetite 0   Feeling bad or failure about yourself  0   Trouble concentrating 0   Moving slowly or fidgety/restless 0   Suicidal thoughts 0   PHQ-9 Score 3   Difficult doing work/chores Not difficult at all      Interpretation of Total Score  Total Score Depression Severity:  1-4 = Minimal depression, 5-9 = Mild depression, 10-14 = Moderate depression, 15-19 = Moderately severe depression, 20-27 = Severe depression   Psychosocial  Evaluation and Intervention:  Psychosocial Evaluation - 05/07/21 1413       Psychosocial Evaluation & Interventions   Interventions Encouraged to exercise with the program and follow exercise prescription    Comments Ms. Ballman is coming to cardiac rehab post NSTEMI. She states she is feeling pretty good, she completed hh pt. Her granddaughter is very helpful, looking out for her and helping her with meals. She doesn't report any current stressors, just the "usual" and that she manages it well. She has had a history of balance concerns so she is glad that this program may help with that. She takes all her medicine like she is supposed to, weighs daily, checks her BP  regularly and wears her CPAP nightly, so she is ready to add cardiac rehab to her list of things to do to make herself feel better.    Expected Outcomes Short: attend cardiac rehab for education and exercise. Long: develop & maintian positive self care habits    Continue Psychosocial Services  Follow up required by staff             Psychosocial Re-Evaluation:   Psychosocial Discharge (Final Psychosocial Re-Evaluation):   Vocational Rehabilitation: Provide vocational rehab assistance to qualifying candidates.   Vocational Rehab Evaluation & Intervention:   Education: Education Goals: Education classes will be provided on a variety of topics geared toward better understanding of heart health and risk factor modification. Participant will state understanding/return demonstration of topics presented as noted by education test scores.  Learning Barriers/Preferences:  Learning Barriers/Preferences - 05/07/21 1411       Learning Barriers/Preferences   Learning Barriers None    Learning Preferences None             General Cardiac Education Topics:  AED/CPR: - Group verbal and written instruction with the use of models to demonstrate the basic use  of the AED with the basic ABC's of resuscitation.   Anatomy  and Cardiac Procedures: - Group verbal and visual presentation and models provide information about basic cardiac anatomy and function. Reviews the testing methods done to diagnose heart disease and the outcomes of the test results. Describes the treatment choices: Medical Management, Angioplasty, or Coronary Bypass Surgery for treating various heart conditions including Myocardial Infarction, Angina, Valve Disease, and Cardiac Arrhythmias.  Written material given at graduation.   Medication Safety: - Group verbal and visual instruction to review commonly prescribed medications for heart and lung disease. Reviews the medication, class of the drug, and side effects. Includes the steps to properly store meds and maintain the prescription regimen.  Written material given at graduation.   Intimacy: - Group verbal instruction through game format to discuss how heart and lung disease can affect sexual intimacy. Written material given at graduation..   Know Your Numbers and Heart Failure: - Group verbal and visual instruction to discuss disease risk factors for cardiac and pulmonary disease and treatment options.  Reviews associated critical values for Overweight/Obesity, Hypertension, Cholesterol, and Diabetes.  Discusses basics of heart failure: signs/symptoms and treatments.  Introduces Heart Failure Zone chart for action plan for heart failure.  Written material given at graduation.   Infection Prevention: - Provides verbal and written material to individual with discussion of infection control including proper hand washing and proper equipment cleaning during exercise session. Flowsheet Row Cardiac Rehab from 05/18/2021 in [facility] Cardiac and Pulmonary Rehab  Education need identified 05/18/21  Date 05/18/21  Educator KL  Instruction Review Code 1- Verbalizes Understanding       Falls Prevention: - Provides verbal and written material to individual with discussion of falls prevention and  safety. Flowsheet Row Cardiac Rehab from 05/18/2021 in [facility] Cardiac and Pulmonary Rehab  Education need identified 05/18/21  Date 05/18/21  Educator KL  Instruction Review Code 1- Verbalizes Understanding       Other: -Provides group and verbal instruction on various topics (see comments)   Knowledge Questionnaire Score:  Knowledge Questionnaire Score - 05/18/21 1501       Knowledge Questionnaire Score   Pre Score 22/26: Angina, Exercise             Core Components/Risk Factors/Patient Goals at Admission:  Personal Goals and Risk Factors at Admission - 05/18/21 1524       Core Components/Risk Factors/Patient Goals on Admission    Weight Management Yes;Weight Loss    Intervention Weight Management: Develop a combined nutrition and exercise program designed to reach desired caloric intake, while maintaining appropriate intake of nutrient and fiber, sodium and fats, and appropriate energy expenditure required for the weight goal.;Weight Management: Provide education and appropriate resources to help participant work on and attain dietary goals.;Weight Management/Obesity: Establish reasonable short term and long term weight goals.;Obesity: Provide education and appropriate resources to help participant work on and attain dietary goals.    Admit Weight 162 lb (73.5 kg)    Goal Weight: Short Term 157 lb (71.2 kg)    Goal Weight: Long Term 152 lb (68.9 kg)    Expected Outcomes Short Term: Continue to assess and modify interventions until short term weight is achieved;Long Term: Adherence to nutrition and physical activity/exercise program aimed toward attainment of established weight goal;Weight Loss: Understanding of general recommendations for a balanced deficit meal plan, which promotes 1-2 lb weight loss per week and includes a negative energy balance of [phone] kcal/d;Understanding recommendations for meals to include 15-35%  energy as protein, 25-35% energy from fat, 35-60% energy  from carbohydrates, less than  of dietary cholesterol, 20-35 gm of total fiber daily;Understanding of distribution of calorie intake throughout the day with the consumption of 4-5 meals/snacks    Hypertension Yes    Intervention Provide education on lifestyle modifcations including regular physical activity/exercise, weight management, moderate sodium restriction and increased consumption of fresh fruit, vegetables, and low fat dairy, alcohol moderation, and smoking cessation.;Monitor prescription use compliance.    Expected Outcomes Short Term: Continued assessment and intervention until BP is < 140/78mm HG in hypertensive participants. < 130/47mm HG in hypertensive participants with diabetes, heart failure or chronic kidney disease.;Long Term: Maintenance of blood pressure at goal levels.    Lipids Yes    Intervention Provide education and support for participant on nutrition & aerobic/resistive exercise along with prescribed medications to achieve LDL 70mg , HDL >40mg .    Expected Outcomes Short Term: Participant states understanding of desired cholesterol values and is compliant with medications prescribed. Participant is following exercise prescription and nutrition guidelines.;Long Term: Cholesterol controlled with medications as prescribed, with individualized exercise RX and with personalized nutrition plan. Value goals: LDL < , HDL > 40 mg.             Education:Diabetes - Individual verbal and written instruction to review signs/symptoms of diabetes, desired ranges of glucose level fasting, after meals and with exercise. Acknowledge that pre and post exercise glucose checks will be done for 3 sessions at entry of program.   Core Components/Risk Factors/Patient Goals Review:    Core Components/Risk Factors/Patient Goals at Discharge (Final Review):    ITP Comments:  ITP Comments     Row Name 05/07/21 1417 05/18/21 1142         ITP Comments Initial telephone  orientation completed. Diagnosis can be found in [facility] 6/9. EP orientation scheduled for Tuesday 7/25 at 10am Completed and gym orientation. Initial ITP created and sent for review to Dr. Bethann Punches, Medical Director.               Comments: Initial ITP

## 2021-05-18 NOTE — Patient Instructions (Signed)
Patient Instructions  Patient Details  Name: Andrea Phillips MRN: [id number] Date of Birth: 06-09-1935 Referring Provider:  Lamar Blinks, MD  Below are your personal goals for exercise, nutrition, and risk factors. Our goal is to help you stay on track towards obtaining and maintaining these goals. We will be discussing your progress on these goals with you throughout the program.  Initial Exercise Prescription:  Initial Exercise Prescription - 05/18/21 1500       Date of Initial Exercise RX and Referring Provider   Date 05/18/21    Referring Provider Arnoldo Hooker MD      NuStep   Level 1    SPM 80    Minutes 15    METs 1.7      Arm Ergometer   Level 1    RPM 30    Minutes 15    METs 1.7      Biostep-RELP   Level 1    SPM 50    Minutes 15    METs 1.7      Track   Laps 16    Minutes 15    METs 1.87      Prescription Details   Frequency (times per week) 3    Duration Progress to 30 minutes of continuous aerobic without signs/symptoms of physical distress      Intensity   THRR 40-80% of Max Heartrate 91-121    Ratings of Perceived Exertion 11-13    Perceived Dyspnea 0-4      Progression   Progression Continue to progress workloads to maintain intensity without signs/symptoms of physical distress.      Resistance Training   Training Prescription Yes    Weight 3 lb    Reps 10-15             Exercise Goals: Frequency: Be able to perform aerobic exercise two to three times per week in program working toward 2-5 days per week of home exercise.  Intensity: Work with a perceived exertion of 11 (fairly light) - 15 (hard) while following your exercise prescription.  We will make changes to your prescription with you as you progress through the program.   Duration: Be able to do 30 to 45 minutes of continuous aerobic exercise in addition to a 5 minute warm-up and a 5 minute cool-down routine.   Nutrition Goals: Your personal nutrition goals will be  established when you do your nutrition analysis with the dietician.  The following are general nutrition guidelines to follow: Cholesterol < 200mg /day Sodium < 1500mg /day Fiber: Women over 50 yrs - 21 grams per day  Personal Goals:  Personal Goals and Risk Factors at Admission - 05/18/21 1524       Core Components/Risk Factors/Patient Goals on Admission    Weight Management Yes;Weight Loss    Intervention Weight Management: Develop a combined nutrition and exercise program designed to reach desired caloric intake, while maintaining appropriate intake of nutrient and fiber, sodium and fats, and appropriate energy expenditure required for the weight goal.;Weight Management: Provide education and appropriate resources to help participant work on and attain dietary goals.;Weight Management/Obesity: Establish reasonable short term and long term weight goals.;Obesity: Provide education and appropriate resources to help participant work on and attain dietary goals.    Admit Weight 162 lb (73.5 kg)    Goal Weight: Short Term 157 lb (71.2 kg)    Goal Weight: Long Term 152 lb (68.9 kg)    Expected Outcomes Short Term: Continue to assess and modify  interventions until short term weight is achieved;Long Term: Adherence to nutrition and physical activity/exercise program aimed toward attainment of established weight goal;Weight Loss: Understanding of general recommendations for a balanced deficit meal plan, which promotes 1-2 lb weight loss per week and includes a negative energy balance of [phone] kcal/d;Understanding recommendations for meals to include 15-35% energy as protein, 25-35% energy from fat, 35-60% energy from carbohydrates, less than 200mg  of dietary cholesterol, 20-35 gm of total fiber daily;Understanding of distribution of calorie intake throughout the day with the consumption of 4-5 meals/snacks    Hypertension Yes    Intervention Provide education on lifestyle modifcations including regular  physical activity/exercise, weight management, moderate sodium restriction and increased consumption of fresh fruit, vegetables, and low fat dairy, alcohol moderation, and smoking cessation.;Monitor prescription use compliance.    Expected Outcomes Short Term: Continued assessment and intervention until BP is < 140/49mm HG in hypertensive participants. < 130/71mm HG in hypertensive participants with diabetes, heart failure or chronic kidney disease.;Long Term: Maintenance of blood pressure at goal levels.    Lipids Yes    Intervention Provide education and support for participant on nutrition & aerobic/resistive exercise along with prescribed medications to achieve LDL 70mg , HDL >40mg .    Expected Outcomes Short Term: Participant states understanding of desired cholesterol values and is compliant with medications prescribed. Participant is following exercise prescription and nutrition guidelines.;Long Term: Cholesterol controlled with medications as prescribed, with individualized exercise RX and with personalized nutrition plan. Value goals: LDL < 70mg , HDL > 40 mg.             Tobacco Use Initial Evaluation: Social History   Tobacco Use  Smoking Status Never  Smokeless Tobacco Never    Exercise Goals and Review:  Exercise Goals     Row Name 05/18/21 1523             Exercise Goals   Increase Physical Activity Yes       Intervention Provide advice, education, support and counseling about physical activity/exercise needs.;Develop an individualized exercise prescription for aerobic and resistive training based on initial evaluation findings, risk stratification, comorbidities and participant's personal goals.       Expected Outcomes Short Term: Attend rehab on a regular basis to increase amount of physical activity.;Long Term: Add in home exercise to make exercise part of routine and to increase amount of physical activity.;Long Term: Exercising regularly at least 3-5 days a week.        Increase Strength and Stamina Yes       Intervention Provide advice, education, support and counseling about physical activity/exercise needs.;Develop an individualized exercise prescription for aerobic and resistive training based on initial evaluation findings, risk stratification, comorbidities and participant's personal goals.       Expected Outcomes Short Term: Increase workloads from initial exercise prescription for resistance, speed, and METs.;Short Term: Perform resistance training exercises routinely during rehab and add in resistance training at home;Long Term: Improve cardiorespiratory fitness, muscular endurance and strength as measured by increased METs and functional capacity ( )       Able to understand and use rate of perceived exertion (RPE) scale Yes       Intervention Provide education and explanation on how to use RPE scale       Expected Outcomes Short Term: Able to use RPE daily in rehab to express subjective intensity level;Long Term:  Able to use RPE to guide intensity level when exercising independently       Able to understand and use  Dyspnea scale Yes       Intervention Provide education and explanation on how to use Dyspnea scale       Expected Outcomes Short Term: Able to use Dyspnea scale daily in rehab to express subjective sense of shortness of breath during exertion;Long Term: Able to use Dyspnea scale to guide intensity level when exercising independently       Knowledge and understanding of Target Heart Rate Range (THRR) Yes       Intervention Provide education and explanation of THRR including how the numbers were predicted and where they are located for reference       Expected Outcomes Short Term: Able to state/look up THRR;Short Term: Able to use daily as guideline for intensity in rehab;Long Term: Able to use THRR to govern intensity when exercising independently       Able to check pulse independently Yes       Intervention Review the importance of being  able to check your own pulse for safety during independent exercise;Provide education and demonstration on how to check pulse in carotid and radial arteries.       Expected Outcomes Short Term: Able to explain why pulse checking is important during independent exercise;Long Term: Able to check pulse independently and accurately       Understanding of Exercise Prescription Yes       Intervention Provide education, explanation, and written materials on patient's individual exercise prescription       Expected Outcomes Short Term: Able to explain program exercise prescription;Long Term: Able to explain home exercise prescription to exercise independently                Copy of goals given to participant.

## 2021-05-24 ENCOUNTER — Encounter: Payer: Medicare Other | Attending: Internal Medicine

## 2021-05-24 ENCOUNTER — Other Ambulatory Visit: Payer: Self-pay

## 2021-05-24 DIAGNOSIS — I252 Old myocardial infarction: Secondary | ICD-10-CM | POA: Insufficient documentation

## 2021-05-24 DIAGNOSIS — I214 Non-ST elevation (NSTEMI) myocardial infarction: Secondary | ICD-10-CM

## 2021-05-24 NOTE — Progress Notes (Signed)
Daily Session Note  Patient Details  Name: Andrea Phillips MRN: [id number] Date of Birth: Mar 19, 1935 Referring Provider:   Flowsheet Row Cardiac Rehab from 05/18/2021 in [facility] Cardiac and Pulmonary Rehab  Referring Provider Serafina Royals MD       Encounter Date: 05/24/2021  Check In:  Session Check In - 05/24/21 1413       Check-In   Supervising physician immediately available to respond to emergencies See telemetry face sheet for immediately available ER MD    Location [facility]-Cardiac & Pulmonary Rehab    Staff Present Birdie Sons, MPA, Nino Glow, MS, ASCM CEP, Exercise Physiologist;Meredith Sherryll Burger, RN BSN    Virtual Visit No    Medication changes reported     No    Fall or balance concerns reported    No    Warm-up and Cool-down Performed on first and last piece of equipment    Resistance Training Performed Yes    VAD Patient? No    PAD/SET Patient? No      Pain Assessment   Currently in Pain? No/denies                Social History   Tobacco Use  Smoking Status Never  Smokeless Tobacco Never    Goals Met:  Independence with exercise equipment Exercise tolerated well No report of cardiac concerns or symptoms Strength training completed today  Goals Unmet:  Not Applicable  Comments: First full day of exercise!  Patient was oriented to gym and equipment including functions, settings, policies, and procedures.  Patient's individual exercise prescription and treatment plan were reviewed.  All starting workloads were established based on the results of the 6 minute walk test done at initial orientation visit.  The plan for exercise progression was also introduced and progression will be customized based on patient's performance and goals.      Dr. Emily Filbert is Medical Director for [place].  Dr. Ottie Glazier is Medical Director for [facility] Pulmonary Rehabilitation.

## 2021-05-26 ENCOUNTER — Other Ambulatory Visit: Payer: Self-pay

## 2021-05-26 ENCOUNTER — Encounter: Payer: Medicare Other | Admitting: *Deleted

## 2021-05-26 DIAGNOSIS — I214 Non-ST elevation (NSTEMI) myocardial infarction: Secondary | ICD-10-CM

## 2021-05-26 DIAGNOSIS — I252 Old myocardial infarction: Secondary | ICD-10-CM | POA: Diagnosis not present

## 2021-05-26 NOTE — Progress Notes (Signed)
Daily Session Note  Patient Details  Name: Andrea Phillips MRN: [id number] Date of Birth: Mar 29, 1935 Referring Provider:   Flowsheet Row Cardiac Rehab from 05/18/2021 in [facility] Cardiac and Pulmonary Rehab  Referring Provider Serafina Royals MD       Encounter Date: 05/26/2021  Check In:  Session Check In - 05/26/21 1602       Check-In   Supervising physician immediately available to respond to emergencies See telemetry face sheet for immediately available ER MD    Location [facility]-Cardiac & Pulmonary Rehab    Staff Present Hope Budds, RDN, Tawanna Solo, MS, ASCM CEP, Exercise Physiologist;Kristen Coble, RN,BC,MSN    Virtual Visit No    Medication changes reported     No    Fall or balance concerns reported    No    Warm-up and Cool-down Performed on first and last piece of equipment    Resistance Training Performed Yes    VAD Patient? No    PAD/SET Patient? No      Pain Assessment   Currently in Pain? No/denies                Social History   Tobacco Use  Smoking Status Never  Smokeless Tobacco Never    Goals Met:  Independence with exercise equipment Exercise tolerated well No report of cardiac concerns or symptoms Strength training completed today  Goals Unmet:  Not Applicable  Comments: Pt able to follow exercise prescription today without complaint.  Will continue to monitor for progression.    Dr. Emily Filbert is Medical Director for [place].  Dr. Ottie Glazier is Medical Director for [facility] Pulmonary Rehabilitation.

## 2021-05-27 ENCOUNTER — Encounter: Payer: Medicare Other | Admitting: *Deleted

## 2021-05-27 ENCOUNTER — Other Ambulatory Visit: Payer: Self-pay

## 2021-05-27 DIAGNOSIS — I214 Non-ST elevation (NSTEMI) myocardial infarction: Secondary | ICD-10-CM

## 2021-05-27 DIAGNOSIS — I252 Old myocardial infarction: Secondary | ICD-10-CM | POA: Diagnosis not present

## 2021-05-27 NOTE — Progress Notes (Signed)
Daily Session Note  Patient Details  Name: Andrea Phillips MRN: [id number] Date of Birth: [birth year] Referring Provider:   Flowsheet Row Cardiac Rehab from 05/18/2021 in [facility] Cardiac and Pulmonary Rehab  Referring Provider Serafina Royals MD       Encounter Date: 05/27/2021  Check In:  Session Check In - 05/27/21 1352       Check-In   Supervising physician immediately available to respond to emergencies See telemetry face sheet for immediately available ER MD    Location [facility]-Cardiac & Pulmonary Rehab    Staff Present Renita Papa, RN BSN;Joseph Wilberforce, RCP,RRT,BSRT;Jessica Fern[place], [place], RCEP, CCRP, CCET    Virtual Visit No    Medication changes reported     No    Fall or balance concerns reported    No    Warm-up and Cool-down Performed on first and last piece of equipment    Resistance Training Performed Yes    VAD Patient? No    PAD/SET Patient? No      Pain Assessment   Currently in Pain? No/denies                Social History   Tobacco Use  Smoking Status Never  Smokeless Tobacco Never    Goals Met:  Independence with exercise equipment Exercise tolerated well No report of cardiac concerns or symptoms Strength training completed today  Goals Unmet:  Not Applicable  Comments: Pt able to follow exercise prescription today without complaint.  Will continue to monitor for progression.    Dr. Emily Filbert is Medical Director for [place].  Dr. Ottie Glazier is Medical Director for [facility] Pulmonary Rehabilitation.

## 2021-05-31 ENCOUNTER — Other Ambulatory Visit: Payer: Self-pay | Admitting: Internal Medicine

## 2021-05-31 ENCOUNTER — Other Ambulatory Visit: Payer: Self-pay

## 2021-05-31 DIAGNOSIS — I214 Non-ST elevation (NSTEMI) myocardial infarction: Secondary | ICD-10-CM

## 2021-05-31 DIAGNOSIS — I252 Old myocardial infarction: Secondary | ICD-10-CM | POA: Diagnosis not present

## 2021-05-31 DIAGNOSIS — Z1231 Encounter for screening mammogram for malignant neoplasm of breast: Secondary | ICD-10-CM

## 2021-05-31 NOTE — Progress Notes (Signed)
Daily Session Note  Patient Details  Name: Andrea Phillips MRN: [id number] Date of Birth: 12/15/1934 Referring Provider:   Flowsheet Row Cardiac Rehab from 05/18/2021 in [facility] Cardiac and Pulmonary Rehab  Referring Provider Serafina Royals MD       Encounter Date: 05/31/2021  Check In:  Session Check In - 05/31/21 1359       Check-In   Supervising physician immediately available to respond to emergencies See telemetry face sheet for immediately available ER MD    Location [facility]-Cardiac & Pulmonary Rehab    Staff Present Birdie Sons, MPA, Elveria Rising, BA, ACSM CEP, Exercise Physiologist;Laureen Owens Shark, BS, RRT, CPFT    Virtual Visit No    Medication changes reported     No    Fall or balance concerns reported    No    Warm-up and Cool-down Performed on first and last piece of equipment    Resistance Training Performed Yes    VAD Patient? No    PAD/SET Patient? No      Pain Assessment   Currently in Pain? No/denies                Social History   Tobacco Use  Smoking Status Never  Smokeless Tobacco Never    Goals Met:  Independence with exercise equipment Exercise tolerated well No report of cardiac concerns or symptoms Strength training completed today  Goals Unmet:  Not Applicable  Comments: Pt able to follow exercise prescription today without complaint.  Will continue to monitor for progression.    Dr. Emily Filbert is Medical Director for [place].  Dr. Ottie Glazier is Medical Director for [facility] Pulmonary Rehabilitation.

## 2021-06-02 ENCOUNTER — Other Ambulatory Visit: Payer: Self-pay

## 2021-06-02 ENCOUNTER — Encounter: Payer: Self-pay | Admitting: *Deleted

## 2021-06-02 DIAGNOSIS — I252 Old myocardial infarction: Secondary | ICD-10-CM | POA: Diagnosis not present

## 2021-06-02 DIAGNOSIS — I214 Non-ST elevation (NSTEMI) myocardial infarction: Secondary | ICD-10-CM

## 2021-06-02 NOTE — Progress Notes (Signed)
Cardiac Individual Treatment Plan  Patient Details  Name: Andrea Phillips MRN: [id number] Date of Birth: 15-Jun-1935 Referring Provider:   Flowsheet Row Cardiac Rehab from 05/18/2021 in [facility] Cardiac and Pulmonary Rehab  Referring Provider Arnoldo Hooker MD       Initial Encounter Date:  Flowsheet Row Cardiac Rehab from 05/18/2021 in [facility] Cardiac and Pulmonary Rehab  Date 05/18/21       Visit Diagnosis: NSTEMI (non-ST elevated myocardial infarction) [facility])  Patient's Home Medications on Admission:  Current Outpatient Medications:    acetaminophen (TYLENOL) 325 MG tablet, Take 325-650 mg by mouth every 6 (six) hours as needed for moderate pain., Disp: , Rfl:    albuterol (VENTOLIN HFA) 108 (90 Base) MCG/ACT inhaler, Inhale 2 puffs into the lungs every 6 (six) hours as needed for wheezing or shortness of breath., Disp: , Rfl:    Cholecalciferol (VITAMIN D3) 1000 units CAPS, Take 1,000 Units by mouth daily., Disp: , Rfl:    fluocinonide (LIDEX) 0.05 % external solution, Apply 1 application topically daily as needed (skin irritations)., Disp: , Rfl:    fluticasone (FLONASE) 50 MCG/ACT nasal spray, Place 1 spray into both nostrils 2 (two) times daily as needed for allergies or rhinitis., Disp: , Rfl:    furosemide (LASIX) 40 MG tablet, Take 1 tablet (40 mg total) by mouth daily. (Patient taking differently: Take 20 mg by mouth daily.), Disp: 90 tablet, Rfl: 3   guaiFENesin (MUCINEX) 600 MG 12 hr tablet, Take 1 tablet (600 mg total) by mouth 2 (two) times daily., Disp: , Rfl:    HYDROcodone-acetaminophen (NORCO/VICODIN) 5-325 MG tablet, Take 1-2 tablets by mouth every 6 (six) hours as needed for severe pain. (Patient not taking: No sig reported), Disp: 14 tablet, Rfl: 0   icosapent Ethyl (VASCEPA) 1 g capsule, Take 2 g by mouth 2 (two) times daily with a meal., Disp: , Rfl:    levothyroxine (SYNTHROID) 112 MCG tablet, Take 112 mcg by mouth daily., Disp: , Rfl:    Magnesium Oxide 250 MG  TABS, Take 1-2 tablets by mouth daily as needed., Disp: , Rfl:    metoprolol succinate (TOPROL-XL) 50 MG 24 hr tablet, Take 1 tablet (50 mg total) by mouth daily. Take with or immediately following a meal. (Patient taking differently: Take 25 mg by mouth daily. Take with or immediately following a meal.), Disp: 90 tablet, Rfl: 1   mometasone-formoterol (DULERA) 200-5 MCG/ACT AERO, Inhale 2 puffs into the lungs 2 (two) times daily., Disp: 1 each, Rfl: 1   omeprazole (PRILOSEC OTC) 20 MG tablet, Take 2 tablets (40 mg total) by mouth daily., Disp: 5 tablet, Rfl: 0   telmisartan (MICARDIS) 40 MG tablet, Take 40 mg by mouth daily., Disp: , Rfl:   Past Medical History: Past Medical History:  Diagnosis Date   Arthritis    ra   Asthma    CHF (congestive heart failure) (HCC)    Dysrhythmia    Edema extremities    GERD (gastroesophageal reflux disease)    Gout    Headache    History of hiatal hernia    Hypertension    Hypothyroidism    Shortness of breath dyspnea    Sleep apnea     Tobacco Use: Social History   Tobacco Use  Smoking Status Never  Smokeless Tobacco Never    Labs: Recent Review Flowsheet Data     Labs for ITP Cardiac and Pulmonary Rehab Latest Ref Rng & Units 04/02/2021   Cholestrol 0 - 200  mg/dL 161   LDLCALC 0 - 99 mg/dL 096(E)   HDL >45 mg/dL 46   Trlycerides <409 mg/dL 60        Exercise Target Goals: Exercise Program Goal: Individual exercise prescription set using results from initial 6 min walk test and THRR while considering  patient's activity barriers and safety.   Exercise Prescription Goal: Initial exercise prescription builds to 30-45 minutes a day of aerobic activity, 2-3 days per week.  Home exercise guidelines will be given to patient during program as part of exercise prescription that the participant will acknowledge.   Education: Aerobic Exercise: - Group verbal and visual presentation on the components of exercise prescription. Introduces  F.I.T.T principle from ACSM for exercise prescriptions.  Reviews F.I.T.T. principles of aerobic exercise including progression. Written material given at graduation.   Education: Resistance Exercise: - Group verbal and visual presentation on the components of exercise prescription. Introduces F.I.T.T principle from ACSM for exercise prescriptions  Reviews F.I.T.T. principles of resistance exercise including progression. Written material given at graduation.    Education: Exercise & Equipment Safety: - Individual verbal instruction and demonstration of equipment use and safety with use of the equipment. Flowsheet Row Cardiac Rehab from 05/26/2021 in [facility] Cardiac and Pulmonary Rehab  Education need identified 05/18/21  Date 05/18/21  Educator KL  Instruction Review Code 1- Verbalizes Understanding       Education: Exercise Physiology & General Exercise Guidelines: - Group verbal and written instruction with models to review the exercise physiology of the cardiovascular system and associated critical values. Provides general exercise guidelines with specific guidelines to those with heart or lung disease.    Education: Flexibility, Balance, Mind/Body Relaxation: - Group verbal and visual presentation with interactive activity on the components of exercise prescription. Introduces F.I.T.T principle from ACSM for exercise prescriptions. Reviews F.I.T.T. principles of flexibility and balance exercise training including progression. Also discusses the mind body connection.  Reviews various relaxation techniques to help reduce and manage stress (i.e. Deep breathing, progressive muscle relaxation, and visualization). Balance handout provided to take home. Written material given at graduation.   Activity Barriers & Risk Stratification:  Activity Barriers & Cardiac Risk Stratification - 05/18/21 1152       Activity Barriers & Cardiac Risk Stratification   Activity Barriers Left Knee  Replacement;Deconditioning;Balance Concerns;Muscular Weakness    Cardiac Risk Stratification Moderate             6 Minute Walk:  6 Minute Walk     Row Name 05/18/21 1155         6 Minute Walk   Phase Initial     Distance 1000 feet     Walk Time 6 minutes     # of Rest Breaks 0     MPH 1.89     METS 1.73     RPE 11     Perceived Dyspnea  1     VO2 Peak 6.06     Symptoms Yes (comment)     Comments Fatigued, slight dizziness- resolved with rest     Resting HR 62 bpm     Resting BP 140/74     Resting Oxygen Saturation  96 %     Exercise Oxygen Saturation  during 6 min walk 96 %     Max Ex. HR 99 bpm     Max Ex. BP 182/70  RCK 150/72     2 Minute Post BP 140/72  Oxygen Initial Assessment:   Oxygen Re-Evaluation:   Oxygen Discharge (Final Oxygen Re-Evaluation):   Initial Exercise Prescription:  Initial Exercise Prescription - 05/18/21 1500       Date of Initial Exercise RX and Referring Provider   Date 05/18/21    Referring Provider Arnoldo Hooker MD      NuStep   Level 1    SPM 80    Minutes 15    METs 1.7      Arm Ergometer   Level 1    RPM 30    Minutes 15    METs 1.7      Biostep-RELP   Level 1    SPM 50    Minutes 15    METs 1.7      Track   Laps 16    Minutes 15    METs 1.87      Prescription Details   Frequency (times per week) 3    Duration Progress to 30 minutes of continuous aerobic without signs/symptoms of physical distress      Intensity   THRR 40-80% of Max Heartrate 91-121    Ratings of Perceived Exertion 11-13    Perceived Dyspnea 0-4      Progression   Progression Continue to progress workloads to maintain intensity without signs/symptoms of physical distress.      Resistance Training   Training Prescription Yes    Weight 3 lb    Reps 10-15             Perform Capillary Blood Glucose checks as needed.  Exercise Prescription Changes:   Exercise Prescription Changes     Row Name  05/18/21 1500 05/31/21 1300           Response to Exercise   Blood Pressure (Admit) 140/74 122/62      Blood Pressure (Exercise) 182/70 162/70      Blood Pressure (Exit) 140/72 122/64      Heart Rate (Admit) 62 bpm 81 bpm      Heart Rate (Exercise) 99 bpm 113 bpm      Heart Rate (Exit) 66 bpm 79 bpm      Oxygen Saturation (Admit) 96 % --      Oxygen Saturation (Exercise) 96 % --      Oxygen Saturation (Exit) 96 % --      Rating of Perceived Exertion (Exercise) 11 14      Perceived Dyspnea (Exercise) 1 --      Symptoms fatigue, slight dizziness --      Comments walk test results third day exercise      Duration -- Progress to 30 minutes of  aerobic without signs/symptoms of physical distress      Intensity -- THRR unchanged             Progression      Progression -- Continue to progress workloads to maintain intensity without signs/symptoms of physical distress.      Average METs -- 2.2             Resistance Training      Training Prescription -- Yes      Weight -- 3 lb      Reps -- 10-15             NuStep      Level -- 1      SPM -- 80      Minutes -- 15      METs -- 2  Track      Laps -- 26      Minutes -- 15      METs -- 2.4              Exercise Comments:   Exercise Comments     Row Name 05/24/21 1414           Exercise Comments First full day of exercise!  Patient was oriented to gym and equipment including functions, settings, policies, and procedures.  Patient's individual exercise prescription and treatment plan were reviewed.  All starting workloads were established based on the results of the 6 minute walk test done at initial orientation visit.  The plan for exercise progression was also introduced and progression will be customized based on patient's performance and goals.                Exercise Goals and Review:   Exercise Goals     Row Name 05/18/21 1523             Exercise Goals   Increase Physical Activity  Yes       Intervention Provide advice, education, support and counseling about physical activity/exercise needs.;Develop an individualized exercise prescription for aerobic and resistive training based on initial evaluation findings, risk stratification, comorbidities and participant's personal goals.       Expected Outcomes Short Term: Attend rehab on a regular basis to increase amount of physical activity.;Long Term: Add in home exercise to make exercise part of routine and to increase amount of physical activity.;Long Term: Exercising regularly at least 3-5 days a week.       Increase Strength and Stamina Yes       Intervention Provide advice, education, support and counseling about physical activity/exercise needs.;Develop an individualized exercise prescription for aerobic and resistive training based on initial evaluation findings, risk stratification, comorbidities and participant's personal goals.       Expected Outcomes Short Term: Increase workloads from initial exercise prescription for resistance, speed, and METs.;Short Term: Perform resistance training exercises routinely during rehab and add in resistance training at home;Long Term: Improve cardiorespiratory fitness, muscular endurance and strength as measured by increased METs and functional capacity ( )       Able to understand and use rate of perceived exertion (RPE) scale Yes       Intervention Provide education and explanation on how to use RPE scale       Expected Outcomes Short Term: Able to use RPE daily in rehab to express subjective intensity level;Long Term:  Able to use RPE to guide intensity level when exercising independently       Able to understand and use Dyspnea scale Yes       Intervention Provide education and explanation on how to use Dyspnea scale       Expected Outcomes Short Term: Able to use Dyspnea scale daily in rehab to express subjective sense of shortness of breath during exertion;Long Term: Able to use  Dyspnea scale to guide intensity level when exercising independently       Knowledge and understanding of Target Heart Rate Range (THRR) Yes       Intervention Provide education and explanation of THRR including how the numbers were predicted and where they are located for reference       Expected Outcomes Short Term: Able to state/look up THRR;Short Term: Able to use daily as guideline for intensity in rehab;Long Term: Able to use THRR to govern intensity when exercising independently  Able to check pulse independently Yes       Intervention Review the importance of being able to check your own pulse for safety during independent exercise;Provide education and demonstration on how to check pulse in carotid and radial arteries.       Expected Outcomes Short Term: Able to explain why pulse checking is important during independent exercise;Long Term: Able to check pulse independently and accurately       Understanding of Exercise Prescription Yes       Intervention Provide education, explanation, and written materials on patient's individual exercise prescription       Expected Outcomes Short Term: Able to explain program exercise prescription;Long Term: Able to explain home exercise prescription to exercise independently                Exercise Goals Re-Evaluation :  Exercise Goals Re-Evaluation     Row Name 05/24/21 1414 05/31/21 1307           Exercise Goal Re-Evaluation   Exercise Goals Review Increase Physical Activity;Able to understand and use rate of perceived exertion (RPE) scale;Knowledge and understanding of Target Heart Rate Range (THRR);Understanding of Exercise Prescription;Increase Strength and Stamina;Able to understand and use Dyspnea scale;Able to check pulse independently Increase Physical Activity;Increase Strength and Stamina      Comments Reviewed RPE and dyspnea scales, THR and program prescription with pt today.  Pt voiced understanding and was given a copy of  goals to take home. Miraya has tolerated exercise well iin her first sessions.  She works in [employer] range.Staff will monitor progress.      Expected Outcomes Short: Use RPE daily to regulate intensity. Long: Follow program prescription in THR. Short: attend consistently Long: improve overall stamina               Discharge Exercise Prescription (Final Exercise Prescription Changes):  Exercise Prescription Changes - 05/31/21 1300       Response to Exercise   Blood Pressure (Admit) 122/62    Blood Pressure (Exercise) 162/70    Blood Pressure (Exit) 122/64    Heart Rate (Admit) 81 bpm    Heart Rate (Exercise) 113 bpm    Heart Rate (Exit) 79 bpm    Rating of Perceived Exertion (Exercise) 14    Comments third day exercise    Duration Progress to 30 minutes of  aerobic without signs/symptoms of physical distress    Intensity THRR unchanged      Progression   Progression Continue to progress workloads to maintain intensity without signs/symptoms of physical distress.    Average METs 2.2      Resistance Training   Training Prescription Yes    Weight 3 lb    Reps 10-15      NuStep   Level 1    SPM 80    Minutes 15    METs 2      Track   Laps 26    Minutes 15    METs 2.4             Nutrition:  Target Goals: Understanding of nutrition guidelines, daily intake of sodium 1500mg , cholesterol 200mg , calories 30% from fat and 7% or less from saturated fats, daily to have 5 or more servings of fruits and vegetables.  Education: All About Nutrition: -Group instruction provided by verbal, written material, interactive activities, discussions, models, and posters to present general guidelines for heart healthy nutrition including fat, fiber, MyPlate, the role of sodium in heart healthy nutrition, utilization  of the nutrition label, and utilization of this knowledge for meal planning. Follow up email sent as well. Written material given at graduation.   Biometrics:  Pre  Biometrics - 05/18/21 1151       Pre Biometrics   Height 5' 1.5" (1.562 m)    Weight 162 lb 14.4 oz (73.9 kg)    BMI (Calculated) 30.29    Single Leg Stand 1.18 seconds              Nutrition Therapy Plan and Nutrition Goals:   Nutrition Assessments:  MEDIFICTS Score Key: ?70 Need to make dietary changes  40-70 Heart Healthy Diet ? 40 Therapeutic Level Cholesterol Diet  Flowsheet Row Cardiac Rehab from 05/18/2021 in [facility] Cardiac and Pulmonary Rehab  Picture Your Plate Total Score on Admission 54      Picture Your Plate Scores: <10 Unhealthy dietary pattern with much room for improvement. 41-50 Dietary pattern unlikely to meet recommendations for good health and room for improvement. 51-60 More healthful dietary pattern, with some room for improvement.  >60 Healthy dietary pattern, although there may be some specific behaviors that could be improved.    Nutrition Goals Re-Evaluation:   Nutrition Goals Discharge (Final Nutrition Goals Re-Evaluation):   Psychosocial: Target Goals: Acknowledge presence or absence of significant depression and/or stress, maximize coping skills, provide positive support system. Participant is able to verbalize types and ability to use techniques and skills needed for reducing stress and depression.   Education: Stress, Anxiety, and Depression - Group verbal and visual presentation to define topics covered.  Reviews how body is impacted by stress, anxiety, and depression.  Also discusses healthy ways to reduce stress and to treat/manage anxiety and depression.  Written material given at graduation.   Education: Sleep Hygiene -Provides group verbal and written instruction about how sleep can affect your health.  Define sleep hygiene, discuss sleep cycles and impact of sleep habits. Review good sleep hygiene tips.    Initial Review & Psychosocial Screening:  Initial Psych Review & Screening - 05/07/21 1411       Initial Review    Current issues with None Identified      Family Dynamics   Good Support System? Yes   grandchildren     Barriers   Psychosocial barriers to participate in program There are no identifiable barriers or psychosocial needs.;The patient should benefit from training in stress management and relaxation.      Screening Interventions   Interventions Encouraged to exercise;Provide feedback about the scores to participant;To provide support and resources with identified psychosocial needs    Expected Outcomes Short Term goal: Utilizing psychosocial counselor, staff and physician to assist with identification of specific Stressors or current issues interfering with healing process. Setting desired goal for each stressor or current issue identified.;Long Term Goal: Stressors or current issues are controlled or eliminated.;Long Term goal: The participant improves quality of Life and PHQ9 Scores as seen by post scores and/or verbalization of changes;Short Term goal: Identification and review with participant of any Quality of Life or Depression concerns found by scoring the questionnaire.             Quality of Life Scores:   Quality of Life - 05/18/21 1520       Quality of Life   Select Quality of Life      Quality of Life Scores   Health/Function Pre 20 %    Socioeconomic Pre 24.1 %    Psych/Spiritual Pre 25 %  Family Pre 24 %    GLOBAL Pre 22.31 %            Scores of 19 and below usually indicate a poorer quality of life in these areas.  A difference of  2-3 points is a clinically meaningful difference.  A difference of 2-3 points in the total score of the Quality of Life Index has been associated with significant improvement in overall quality of life, self-image, physical symptoms, and general health in studies assessing change in quality of life.  PHQ-9: Recent Review Flowsheet Data     Depression screen [facility] 2/9 05/18/2021   Decreased Interest 1   Down, Depressed, Hopeless 1    PHQ - 2 Score 2   Altered sleeping 0   Tired, decreased energy 1   Change in appetite 0   Feeling bad or failure about yourself  0   Trouble concentrating 0   Moving slowly or fidgety/restless 0   Suicidal thoughts 0   PHQ-9 Score 3   Difficult doing work/chores Not difficult at all      Interpretation of Total Score  Total Score Depression Severity:  1-4 = Minimal depression, 5-9 = Mild depression, 10-14 = Moderate depression, 15-19 = Moderately severe depression, 20-27 = Severe depression   Psychosocial Evaluation and Intervention:  Psychosocial Evaluation - 05/07/21 1413       Psychosocial Evaluation & Interventions   Interventions Encouraged to exercise with the program and follow exercise prescription    Comments Ms. Lightle is coming to cardiac rehab post NSTEMI. She states she is feeling pretty good, she completed hh pt. Her granddaughter is very helpful, looking out for her and helping her with meals. She doesn't report any current stressors, just the "usual" and that she manages it well. She has had a history of balance concerns so she is glad that this program may help with that. She takes all her medicine like she is supposed to, weighs daily, checks her BP  regularly and wears her CPAP nightly, so she is ready to add cardiac rehab to her list of things to do to make herself feel better.    Expected Outcomes Short: attend cardiac rehab for education and exercise. Long: develop & maintian positive self care habits    Continue Psychosocial Services  Follow up required by staff             Psychosocial Re-Evaluation:   Psychosocial Discharge (Final Psychosocial Re-Evaluation):   Vocational Rehabilitation: Provide vocational rehab assistance to qualifying candidates.   Vocational Rehab Evaluation & Intervention:   Education: Education Goals: Education classes will be provided on a variety of topics geared toward better understanding of heart health and risk  factor modification. Participant will state understanding/return demonstration of topics presented as noted by education test scores.  Learning Barriers/Preferences:  Learning Barriers/Preferences - 05/07/21 1411       Learning Barriers/Preferences   Learning Barriers None    Learning Preferences None             General Cardiac Education Topics:  AED/CPR: - Group verbal and written instruction with the use of models to demonstrate the basic use of the AED with the basic ABC's of resuscitation.   Anatomy and Cardiac Procedures: - Group verbal and visual presentation and models provide information about basic cardiac anatomy and function. Reviews the testing methods done to diagnose heart disease and the outcomes of the test results. Describes the treatment choices: Medical Management, Angioplasty, or Coronary Bypass Surgery  for treating various heart conditions including Myocardial Infarction, Angina, Valve Disease, and Cardiac Arrhythmias.  Written material given at graduation.   Medication Safety: - Group verbal and visual instruction to review commonly prescribed medications for heart and lung disease. Reviews the medication, class of the drug, and side effects. Includes the steps to properly store meds and maintain the prescription regimen.  Written material given at graduation.   Intimacy: - Group verbal instruction through game format to discuss how heart and lung disease can affect sexual intimacy. Written material given at graduation..   Know Your Numbers and Heart Failure: - Group verbal and visual instruction to discuss disease risk factors for cardiac and pulmonary disease and treatment options.  Reviews associated critical values for Overweight/Obesity, Hypertension, Cholesterol, and Diabetes.  Discusses basics of heart failure: signs/symptoms and treatments.  Introduces Heart Failure Zone chart for action plan for heart failure.  Written material given at  graduation.   Infection Prevention: - Provides verbal and written material to individual with discussion of infection control including proper hand washing and proper equipment cleaning during exercise session. Flowsheet Row Cardiac Rehab from 05/26/2021 in [facility] Cardiac and Pulmonary Rehab  Education need identified 05/18/21  Date 05/18/21  Educator KL  Instruction Review Code 1- Verbalizes Understanding       Falls Prevention: - Provides verbal and written material to individual with discussion of falls prevention and safety. Flowsheet Row Cardiac Rehab from 05/26/2021 in [facility] Cardiac and Pulmonary Rehab  Education need identified 05/18/21  Date 05/18/21  Educator KL  Instruction Review Code 1- Verbalizes Understanding       Other: -Provides group and verbal instruction on various topics (see comments)   Knowledge Questionnaire Score:  Knowledge Questionnaire Score - 05/18/21 1501       Knowledge Questionnaire Score   Pre Score 22/26: [place], Exercise             Core Components/Risk Factors/Patient Goals at Admission:  Personal Goals and Risk Factors at Admission - 05/18/21 1524       Core Components/Risk Factors/Patient Goals on Admission    Weight Management Yes;Weight Loss    Intervention Weight Management: Develop a combined nutrition and exercise program designed to reach desired caloric intake, while maintaining appropriate intake of nutrient and fiber, sodium and fats, and appropriate energy expenditure required for the weight goal.;Weight Management: Provide education and appropriate resources to help participant work on and attain dietary goals.;Weight Management/Obesity: Establish reasonable short term and long term weight goals.;Obesity: Provide education and appropriate resources to help participant work on and attain dietary goals.    Admit Weight 162 lb (73.5 kg)    Goal Weight: Short Term 157 lb (71.2 kg)    Goal Weight: Long Term 152 lb (68.9 kg)     Expected Outcomes Short Term: Continue to assess and modify interventions until short term weight is achieved;Long Term: Adherence to nutrition and physical activity/exercise program aimed toward attainment of established weight goal;Weight Loss: Understanding of general recommendations for a balanced deficit meal plan, which promotes 1-2 lb weight loss per week and includes a negative energy balance of [phone] kcal/d;Understanding recommendations for meals to include 15-35% energy as protein, 25-35% energy from fat, 35-60% energy from carbohydrates, less than  of dietary cholesterol, 20-35 gm of total fiber daily;Understanding of distribution of calorie intake throughout the day with the consumption of 4-5 meals/snacks    Hypertension Yes    Intervention Provide education on lifestyle modifcations including regular physical activity/exercise, weight management, moderate  sodium restriction and increased consumption of fresh fruit, vegetables, and low fat dairy, alcohol moderation, and smoking cessation.;Monitor prescription use compliance.    Expected Outcomes Short Term: Continued assessment and intervention until BP is < 140/17mm HG in hypertensive participants. < 130/50mm HG in hypertensive participants with diabetes, heart failure or chronic kidney disease.;Long Term: Maintenance of blood pressure at goal levels.    Lipids Yes    Intervention Provide education and support for participant on nutrition & aerobic/resistive exercise along with prescribed medications to achieve LDL 70mg , HDL >40mg .    Expected Outcomes Short Term: Participant states understanding of desired cholesterol values and is compliant with medications prescribed. Participant is following exercise prescription and nutrition guidelines.;Long Term: Cholesterol controlled with medications as prescribed, with individualized exercise RX and with personalized nutrition plan. Value goals: LDL < 70mg , HDL > 40 mg.              Education:Diabetes - Individual verbal and written instruction to review signs/symptoms of diabetes, desired ranges of glucose level fasting, after meals and with exercise. Acknowledge that pre and post exercise glucose checks will be done for 3 sessions at entry of program.   Core Components/Risk Factors/Patient Goals Review:    Core Components/Risk Factors/Patient Goals at Discharge (Final Review):    ITP Comments:  ITP Comments     Row Name 05/07/21 1417 05/18/21 1142 05/24/21 1414 06/02/21 0745     ITP Comments Initial telephone orientation completed. Diagnosis can be found in [facility] 6/9. EP orientation scheduled for Tuesday 7/25 at 10am Completed and gym orientation. Initial ITP created and sent for review to Dr. Bethann Punches, Medical Director. First full day of exercise!  Patient was oriented to gym and equipment including functions, settings, policies, and procedures.  Patient's individual exercise prescription and treatment plan were reviewed.  All starting workloads were established based on the results of the 6 minute walk test done at initial orientation visit.  The plan for exercise progression was also introduced and progression will be customized based on patient's performance and goals. 30 Day review completed. Medical Director ITP review done, changes made as directed, and signed approval by Medical Director.             Comments:

## 2021-06-02 NOTE — Progress Notes (Signed)
Daily Session Note  Patient Details  Name: Andrea Phillips MRN: [id number] Date of Birth: August 27, 1935 Referring Provider:   Flowsheet Row Cardiac Rehab from 05/18/2021 in [facility] Cardiac and Pulmonary Rehab  Referring Provider Serafina Royals MD       Encounter Date: 06/02/2021  Check In:  Session Check In - 06/02/21 1406       Check-In   Supervising physician immediately available to respond to emergencies See telemetry face sheet for immediately available ER MD    Location [facility]-Cardiac & Pulmonary Rehab    Staff Present Birdie Sons, MPA, Nino Glow, MS, ASCM CEP, Exercise Physiologist;Laureen Owens Shark, BS, RRT, CPFT    Virtual Visit No    Medication changes reported     No    Fall or balance concerns reported    No    Warm-up and Cool-down Performed on first and last piece of equipment    Resistance Training Performed Yes    VAD Patient? No    PAD/SET Patient? No      Pain Assessment   Currently in Pain? No/denies                Social History   Tobacco Use  Smoking Status Never  Smokeless Tobacco Never    Goals Met:  Independence with exercise equipment Exercise tolerated well No report of cardiac concerns or symptoms Strength training completed today  Goals Unmet:  Not Applicable  Comments: Pt able to follow exercise prescription today without complaint.  Will continue to monitor for progression.    Dr. Emily Filbert is Medical Director for [place].  Dr. Ottie Glazier is Medical Director for [facility] Pulmonary Rehabilitation.

## 2021-06-03 ENCOUNTER — Other Ambulatory Visit: Payer: Self-pay

## 2021-06-03 DIAGNOSIS — I252 Old myocardial infarction: Secondary | ICD-10-CM | POA: Diagnosis not present

## 2021-06-03 DIAGNOSIS — I214 Non-ST elevation (NSTEMI) myocardial infarction: Secondary | ICD-10-CM

## 2021-06-03 NOTE — Progress Notes (Signed)
Daily Session Note  Patient Details  Name: Andrea Phillips MRN: [id number] Date of Birth: [birth year] Referring Provider:   Flowsheet Row Cardiac Rehab from 05/18/2021 in [facility] Cardiac and Pulmonary Rehab  Referring Provider Serafina Royals MD       Encounter Date: 06/03/2021  Check In:  Session Check In - 06/03/21 1357       Check-In   Supervising physician immediately available to respond to emergencies See telemetry face sheet for immediately available ER MD    Location [facility]-Cardiac & Pulmonary Rehab    Staff Present Birdie Sons, MPA, RN;Jessica Luan Pulling, MA, RCEP, CCRP, CCET;Melissa [place], RDN, LDN    Virtual Visit No    Medication changes reported     No    Fall or balance concerns reported    No    Warm-up and Cool-down Performed on first and last piece of equipment    Resistance Training Performed Yes    VAD Patient? No    PAD/SET Patient? No      Pain Assessment   Currently in Pain? No/denies                Social History   Tobacco Use  Smoking Status Never  Smokeless Tobacco Never    Goals Met:  Independence with exercise equipment Exercise tolerated well No report of cardiac concerns or symptoms Strength training completed today  Goals Unmet:  Not Applicable  Comments: Pt able to follow exercise prescription today without complaint.  Will continue to monitor for progression.  Reviewed home exercise with pt today.  Pt plans to walk at home and use staff videos for exercise.  Reviewed THR, pulse, RPE, sign and symptoms, pulse oximetery and when to call 911 or MD.  Also discussed weather considerations and indoor options.  Pt voiced understanding.   Dr. Emily Filbert is Medical Director for [place].  Dr. Ottie Glazier is Medical Director for [facility] Pulmonary Rehabilitation.

## 2021-06-07 ENCOUNTER — Other Ambulatory Visit: Payer: Self-pay

## 2021-06-07 DIAGNOSIS — I214 Non-ST elevation (NSTEMI) myocardial infarction: Secondary | ICD-10-CM

## 2021-06-07 DIAGNOSIS — I252 Old myocardial infarction: Secondary | ICD-10-CM | POA: Diagnosis not present

## 2021-06-07 NOTE — Progress Notes (Signed)
Daily Session Note  Patient Details  Name: Andrea Phillips MRN: [id number] Date of Birth: [birth year] Referring Provider:   Flowsheet Row Cardiac Rehab from 05/18/2021 in [facility] Cardiac and Pulmonary Rehab  Referring Provider Serafina Royals MD       Encounter Date: 06/07/2021  Check In:  Session Check In - 06/07/21 1354       Check-In   Supervising physician immediately available to respond to emergencies See telemetry face sheet for immediately available ER MD    Location [facility]-Cardiac & Pulmonary Rehab    Staff Present Birdie Sons, MPA, Nino Glow, MS, ASCM CEP, Exercise Physiologist;Joseph Tessie Fass, [place]    Virtual Visit No    Medication changes reported     No    Fall or balance concerns reported    No    Warm-up and Cool-down Performed on first and last piece of equipment    Resistance Training Performed Yes    VAD Patient? No    PAD/SET Patient? No      Pain Assessment   Currently in Pain? No/denies                Social History   Tobacco Use  Smoking Status Never  Smokeless Tobacco Never    Goals Met:  Independence with exercise equipment Exercise tolerated well No report of cardiac concerns or symptoms Strength training completed today  Goals Unmet:  Not Applicable  Comments: Pt able to follow exercise prescription today without complaint.  Will continue to monitor for progression.    Dr. Emily Filbert is Medical Director for [place].  Dr. Ottie Glazier is Medical Director for [facility] Pulmonary Rehabilitation.

## 2021-06-07 NOTE — Progress Notes (Signed)
Completed initial RD consultation ?

## 2021-06-09 ENCOUNTER — Encounter: Payer: Self-pay | Admitting: Family

## 2021-06-09 ENCOUNTER — Other Ambulatory Visit: Payer: Self-pay

## 2021-06-09 ENCOUNTER — Ambulatory Visit: Payer: Medicare Other | Attending: Family | Admitting: Family

## 2021-06-09 VITALS — BP 197/70 | HR 70 | Resp 16 | Ht 60.0 in | Wt 160.0 lb

## 2021-06-09 DIAGNOSIS — I5022 Chronic systolic (congestive) heart failure: Secondary | ICD-10-CM | POA: Insufficient documentation

## 2021-06-09 DIAGNOSIS — I110 Hypertensive heart disease with heart failure: Secondary | ICD-10-CM | POA: Insufficient documentation

## 2021-06-09 DIAGNOSIS — J45909 Unspecified asthma, uncomplicated: Secondary | ICD-10-CM | POA: Diagnosis not present

## 2021-06-09 DIAGNOSIS — Z888 Allergy status to other drugs, medicaments and biological substances status: Secondary | ICD-10-CM | POA: Insufficient documentation

## 2021-06-09 DIAGNOSIS — I10 Essential (primary) hypertension: Secondary | ICD-10-CM

## 2021-06-09 DIAGNOSIS — I5020 Unspecified systolic (congestive) heart failure: Secondary | ICD-10-CM

## 2021-06-09 DIAGNOSIS — Z885 Allergy status to narcotic agent status: Secondary | ICD-10-CM | POA: Diagnosis not present

## 2021-06-09 DIAGNOSIS — Z7951 Long term (current) use of inhaled steroids: Secondary | ICD-10-CM | POA: Diagnosis not present

## 2021-06-09 DIAGNOSIS — G4730 Sleep apnea, unspecified: Secondary | ICD-10-CM | POA: Diagnosis not present

## 2021-06-09 DIAGNOSIS — I252 Old myocardial infarction: Secondary | ICD-10-CM | POA: Insufficient documentation

## 2021-06-09 DIAGNOSIS — Z886 Allergy status to analgesic agent status: Secondary | ICD-10-CM | POA: Diagnosis not present

## 2021-06-09 DIAGNOSIS — Z79899 Other long term (current) drug therapy: Secondary | ICD-10-CM | POA: Insufficient documentation

## 2021-06-09 DIAGNOSIS — G4733 Obstructive sleep apnea (adult) (pediatric): Secondary | ICD-10-CM

## 2021-06-09 DIAGNOSIS — I214 Non-ST elevation (NSTEMI) myocardial infarction: Secondary | ICD-10-CM

## 2021-06-09 NOTE — Progress Notes (Signed)
Patient ID: Andrea Phillips, female    DOB: 1935-07-15, 86 y.o.   MRN: [id number]  HPI  Andrea Phillips is a 86 y/o female with a history of asthma, HTN, thyroid disease, GERD, gout, sleep apnea & chronic heart failure.   Echo report from 04/02/21 reviewed and showed an EF of 25-30% along with mild/moderate MR/TR.   LHC 04/02/21 showed: Cardiac catheterization showing normal coronary arteries with 0% stenosis Echocardiogram showing global LV systolic dysfunction ejection of 35%  Admitted 04/01/21 due to acute on chronic HF along with NSTEMI. Cardiology consult obtained. Given IV heparin along with IV lasix. Cath done which showed clean arteries. Diuretics changed to oral. Given steroids and breathing treatments for asthma exacerbation. Able to be weaned off of oxygen. Discharged after 3 days.   She presents today for a follow-up visit with a chief complaint of moderate fatigue upon minimal exertion. She describes this as chronic in nature having been present for several months. She has associated cough, shortness of breath, pedal edema, decreased appetite & light-headedness along with this. She denies any difficulty sleeping, abdominal distention, palpitations, chest pain or weight gain.   Participating in cardiac rehab three days/ week. Has not taken any of her medications except her thyroid med yet today as she says that she normally takes them once she gets home from rehab. Says that today she just doesn't feel well but doesn't quite know why.   Past Medical History:  Diagnosis Date   Arthritis    ra   Asthma    CHF (congestive heart failure) (HCC)    Dysrhythmia    Edema extremities    GERD (gastroesophageal reflux disease)    Gout    Headache    History of hiatal hernia    Hypertension    Hypothyroidism    Shortness of breath dyspnea    Sleep apnea    Past Surgical History:  Procedure Laterality Date   APPENDECTOMY     BACK SURGERY     CARDIAC CATHETERIZATION     CATARACT  EXTRACTION W/PHACO Left 03/09/2015   Procedure: CATARACT EXTRACTION PHACO AND INTRAOCULAR LENS PLACEMENT (IOC);  Surgeon: Sallee Lange, MD;  Location: [facility] ORS;  Service: Ophthalmology;  Laterality: Left;  [place] 01:31 AP% 26.1 CDE 43.72   CHOLECYSTECTOMY     EYE SURGERY     retina   JOINT REPLACEMENT     left knee   LEFT HEART CATH AND CORONARY ANGIOGRAPHY N/A 04/02/2021   Procedure: LEFT HEART CATH AND CORONARY ANGIOGRAPHY;  Surgeon: Lamar Blinks, MD;  Location: [facility] INVASIVE CV LAB;  Service: Cardiovascular;  Laterality: N/A;   Family History  Problem Relation Age of Onset   Breast cancer Cousin    Social History   Tobacco Use   Smoking status: Never   Smokeless tobacco: Never  Substance Use Topics   Alcohol use: No   Allergies  Allergen Reactions   Other Hives    Yellow dye 6 FOOD COLOR YELLOW POWDER    Ace Inhibitors Cough   Amlodipine Itching   Aspirin    Atorvastatin Other (See Comments)   Codeine Other (See Comments)    Doesn't know   Conj Estrog-Medroxyprogest Ace     PREMPRO 0.3-1.5 MG ORAL TABLET   Prednisone    Raloxifene     EVISTA 60 MG ORAL TABLET   Valsartan Itching   Prior to Admission medications   Medication Sig Start Date End Date Taking? Authorizing Provider  acetaminophen (TYLENOL) 325  MG tablet Take 325-650 mg by mouth every 6 (six) hours as needed for moderate pain.   Yes [provider]  albuterol (VENTOLIN HFA) 108 (90 Base) MCG/ACT inhaler Inhale 2 puffs into the lungs every 6 (six) hours as needed for wheezing or shortness of breath.   Yes [provider]  Cholecalciferol (VITAMIN D3) 1.25 MG (50000 UT) TABS Take 1,000 Units by mouth once a week.   Yes [provider]  fluocinonide (LIDEX) 0.05 % external solution Apply 1 application topically daily as needed (skin irritations).   Yes [provider]  fluticasone (FLONASE) 50 MCG/ACT nasal spray Place 1 spray into both nostrils 2 (two) times daily as  needed for allergies or rhinitis.   Yes [provider]  furosemide (LASIX) 40 MG tablet Take 1 tablet (40 mg total) by mouth daily. Patient taking differently: Take 20 mg by mouth daily. 04/03/21 04/03/22 Yes Almon Hercules, MD  guaiFENesin (MUCINEX) 600 MG 12 hr tablet Take 1 tablet (600 mg total) by mouth 2 (two) times daily. 04/04/21  Yes Almon Hercules, MD  icosapent Ethyl (VASCEPA) 1 g capsule Take 2 g by mouth 2 (two) times daily with a meal.   Yes [provider]  levothyroxine (SYNTHROID) 112 MCG tablet Take 112 mcg by mouth daily. 02/23/21  Yes [provider]  Magnesium Oxide 250 MG TABS Take 1-2 tablets by mouth daily as needed.   Yes [provider]  metoprolol succinate (TOPROL-XL) 50 MG 24 hr tablet Take 1 tablet (50 mg total) by mouth daily. Take with or immediately following a meal. Patient taking differently: Take 25 mg by mouth daily. Take with or immediately following a meal. 04/05/21  Yes Gonfa, Boyce Medici, MD  mometasone-formoterol (DULERA) 200-5 MCG/ACT AERO Inhale 2 puffs into the lungs 2 (two) times daily. 04/04/21  Yes Almon Hercules, MD  omeprazole (PRILOSEC OTC) 20 MG tablet Take 2 tablets (40 mg total) by mouth daily. 04/04/21  Yes Almon Hercules, MD  telmisartan (MICARDIS) 40 MG tablet Take 40 mg by mouth daily. 03/06/21  Yes [provider]  HYDROcodone-acetaminophen (NORCO/VICODIN) 5-325 MG tablet Take 1-2 tablets by mouth every 6 (six) hours as needed for severe pain. Patient not taking: Reported on 06/09/2021 04/09/20   Glade Lloyd, MD    Review of Systems  Constitutional:  Positive for appetite change (decreased) and fatigue (tire easily).  HENT:  Negative for congestion, postnasal drip and sore throat.   Eyes: Negative.   Respiratory:  Positive for cough (at times) and shortness of breath (minimal).   Cardiovascular:  Positive for leg swelling. Negative for chest pain and palpitations.  Gastrointestinal:  Negative for abdominal  distention and abdominal pain.  Endocrine: Negative.   Genitourinary: Negative.   Musculoskeletal:  Negative for back pain and neck pain.  Skin: Negative.   Allergic/Immunologic: Negative.   Neurological:  Positive for light-headedness (at times). Negative for dizziness.  Hematological:  Negative for adenopathy. Does not bruise/bleed easily.  Psychiatric/Behavioral:  Negative for dysphoric mood and sleep disturbance (sleeping on 1 pillow with CPAP). The patient is not nervous/anxious.    Vitals:   06/09/21 1333  BP: (!) 197/70  Pulse: 70  Resp: 16  SpO2: 97%  Weight: 160 lb (72.6 kg)  Height: 5' (1.524 m)   Wt Readings from Last 3 Encounters:  06/09/21 160 lb (72.6 kg)  05/18/21 162 lb 14.4 oz (73.9 kg)  04/22/21 162 lb 6 oz (73.7 kg)   Lab  Results  Component Value Date   CREATININE 0.65 04/04/2021   CREATININE 0.83 04/03/2021   CREATININE 0.84 04/02/2021    Physical Exam Vitals reviewed.  Constitutional:      Appearance: Normal appearance.  HENT:     Head: Normocephalic and atraumatic.  Cardiovascular:     Rate and Rhythm: Normal rate and regular rhythm.  Pulmonary:     Effort: Pulmonary effort is normal. No respiratory distress.     Breath sounds: No wheezing or rales.  Abdominal:     General: There is no distension.     Palpations: Abdomen is soft.     Tenderness: There is no abdominal tenderness.  Musculoskeletal:        General: No tenderness.     Cervical back: Normal range of motion and neck supple.     Right lower leg: No edema.     Left lower leg: No edema.  Skin:    General: Skin is warm and dry.  Neurological:     General: No focal deficit present.     Mental Status: She is alert and oriented to person, place, and time.  Psychiatric:        Mood and Affect: Mood normal.        Behavior: Behavior normal.        Thought Content: Thought content normal.    Assessment & Plan:  1: Chronic heart failure with reduced ejection fraction- - NYHA class  III - euvolemic today - weighing daily; reminded to call for an overnight weight gain of > 2 pounds or a weekly weight gain of > 5 pounds - weight down 2 pounds from last visit here 2 months ago - not adding salt to her food - saw cardiology Gwen Pounds) 04/15/21; returns October - on GDMT of metoprolol and telmisartan - participating in cardiac rehab three times/ week - difficult to adjust meds when she hasn't taken her meds yet today - BNP 04/01/21 was 559.2  2: HTN- - BP elevated (197/70) but she hasn't taken her meds yet today - saw PCP Valrie Hart) a couple of weeks ago - BMP 04/04/21 reviewed and showed sodium 140, potassium 4.0, creatinine 0.65 and GFR >60  3: Sleep apnea- - saw pulmonology Meredeth Ide) 01/27/21 - wear CPAP nightly   Medication bottles reviewed.   Due to HF stability, will not make a return appointment for patient at this time. Advised her that she could call back at anytime for any questions or to make another appointment and she was comfortable with this plan. Discussed the importance of taking her medications prior to medical appointments so that the medication effectiveness can be assessed.

## 2021-06-09 NOTE — Progress Notes (Signed)
Daily Session Note  Patient Details  Name: Andrea Phillips MRN: [id number] Date of Birth: 04-Nov-1934 Referring Provider:   Flowsheet Row Cardiac Rehab from 05/18/2021 in [facility] Cardiac and Pulmonary Rehab  Referring Provider Serafina Royals MD       Encounter Date: 06/09/2021  Check In:  Session Check In - 06/09/21 1410       Check-In   Supervising physician immediately available to respond to emergencies See telemetry face sheet for immediately available ER MD    Location [facility]-Cardiac & Pulmonary Rehab    Staff Present Birdie Sons, MPA, Nino Glow, MS, ASCM CEP, Exercise Physiologist;Joseph Tessie Fass, [place]    Virtual Visit No    Medication changes reported     No    Fall or balance concerns reported    No    Warm-up and Cool-down Performed on first and last piece of equipment    Resistance Training Performed Yes    VAD Patient? No    PAD/SET Patient? No      Pain Assessment   Currently in Pain? No/denies                Social History   Tobacco Use  Smoking Status Never  Smokeless Tobacco Never    Goals Met:  Independence with exercise equipment Exercise tolerated well No report of cardiac concerns or symptoms Strength training completed today  Goals Unmet:  Not Applicable  Comments: Pt able to follow exercise prescription today without complaint.  Will continue to monitor for progression.    Dr. Emily Filbert is Medical Director for [place].  Dr. Ottie Glazier is Medical Director for [facility] Pulmonary Rehabilitation.

## 2021-06-09 NOTE — Patient Instructions (Addendum)
Continue weighing daily and call for an overnight weight gain of > 2 pounds or a weekly weight gain of >5 pounds.    Call us in the future for any questions/ problems

## 2021-06-10 ENCOUNTER — Encounter: Payer: Medicare Other | Admitting: *Deleted

## 2021-06-10 ENCOUNTER — Other Ambulatory Visit: Payer: Self-pay

## 2021-06-10 DIAGNOSIS — I214 Non-ST elevation (NSTEMI) myocardial infarction: Secondary | ICD-10-CM

## 2021-06-10 DIAGNOSIS — I252 Old myocardial infarction: Secondary | ICD-10-CM | POA: Diagnosis not present

## 2021-06-10 NOTE — Progress Notes (Signed)
Daily Session Note  Patient Details  Name: Andrea Phillips MRN: [id number] Date of Birth: 13-May-1935 Referring Provider:   Flowsheet Row Cardiac Rehab from 05/18/2021 in [facility] Cardiac and Pulmonary Rehab  Referring Provider Serafina Royals MD       Encounter Date: 06/10/2021  Check In:  Session Check In - 06/10/21 1413       Check-In   Supervising physician immediately available to respond to emergencies See telemetry face sheet for immediately available ER MD    Location [facility]-Cardiac & Pulmonary Rehab    Staff Present Renita Papa, RN BSN;Joseph Tessie Fass, RCP,RRT,BSRT;Melissa [place], [place], LDN    Virtual Visit No    Medication changes reported     No    Fall or balance concerns reported    No    Warm-up and Cool-down Performed on first and last piece of equipment    Resistance Training Performed Yes    VAD Patient? No    PAD/SET Patient? No      Pain Assessment   Currently in Pain? No/denies                Social History   Tobacco Use  Smoking Status Never  Smokeless Tobacco Never    Goals Met:  Independence with exercise equipment Exercise tolerated well No report of cardiac concerns or symptoms Strength training completed today  Goals Unmet:  Not Applicable  Comments: Pt able to follow exercise prescription today without complaint.  Will continue to monitor for progression.    Dr. Emily Filbert is Medical Director for [place].  Dr. Ottie Glazier is Medical Director for [facility] Pulmonary Rehabilitation.

## 2021-06-14 ENCOUNTER — Other Ambulatory Visit: Payer: Self-pay

## 2021-06-14 DIAGNOSIS — I252 Old myocardial infarction: Secondary | ICD-10-CM | POA: Diagnosis not present

## 2021-06-14 DIAGNOSIS — I214 Non-ST elevation (NSTEMI) myocardial infarction: Secondary | ICD-10-CM

## 2021-06-14 NOTE — Progress Notes (Signed)
Daily Session Note  Patient Details  Name: Andrea Phillips MRN: [id number] Date of Birth: October 05, 1935 Referring Provider:   Flowsheet Row Cardiac Rehab from 05/18/2021 in [facility] Cardiac and Pulmonary Rehab  Referring Provider Serafina Royals MD       Encounter Date: 06/14/2021  Check In:  Session Check In - 06/14/21 1422       Check-In   Supervising physician immediately available to respond to emergencies See telemetry face sheet for immediately available ER MD    Location [facility]-Cardiac & Pulmonary Rehab    Staff Present Birdie Sons, MPA, Nino Glow, MS, ASCM CEP, Exercise Physiologist;Joseph Tessie Fass, [place]    Virtual Visit No    Medication changes reported     No    Fall or balance concerns reported    No    Warm-up and Cool-down Performed on first and last piece of equipment    Resistance Training Performed Yes    VAD Patient? No    PAD/SET Patient? No      Pain Assessment   Currently in Pain? No/denies                Social History   Tobacco Use  Smoking Status Never  Smokeless Tobacco Never    Goals Met:  Independence with exercise equipment Exercise tolerated well No report of cardiac concerns or symptoms Strength training completed today  Goals Unmet:  Not Applicable  Comments: Pt able to follow exercise prescription today without complaint.  Will continue to monitor for progression.    Dr. Emily Filbert is Medical Director for [place].  Dr. Ottie Glazier is Medical Director for [facility] Pulmonary Rehabilitation.

## 2021-06-16 ENCOUNTER — Other Ambulatory Visit: Payer: Self-pay

## 2021-06-16 DIAGNOSIS — I214 Non-ST elevation (NSTEMI) myocardial infarction: Secondary | ICD-10-CM

## 2021-06-16 DIAGNOSIS — I252 Old myocardial infarction: Secondary | ICD-10-CM | POA: Diagnosis not present

## 2021-06-16 NOTE — Progress Notes (Signed)
Daily Session Note  Patient Details  Name: Andrea Phillips MRN: [id number] Date of Birth: Jul 07, 1935 Referring Provider:   Flowsheet Row Cardiac Rehab from 05/18/2021 in [facility] Cardiac and Pulmonary Rehab  Referring Provider Serafina Royals MD       Encounter Date: 06/16/2021  Check In:  Session Check In - 06/16/21 1404       Check-In   Supervising physician immediately available to respond to emergencies See telemetry face sheet for immediately available ER MD    Location [facility]-Cardiac & Pulmonary Rehab    Staff Present Birdie Sons, MPA, Nino Glow, MS, ASCM CEP, Exercise Physiologist;Joseph Tessie Fass, [place]    Virtual Visit No    Medication changes reported     No    Fall or balance concerns reported    No    Warm-up and Cool-down Performed on first and last piece of equipment    Resistance Training Performed Yes    VAD Patient? No    PAD/SET Patient? No      Pain Assessment   Currently in Pain? No/denies                Social History   Tobacco Use  Smoking Status Never  Smokeless Tobacco Never    Goals Met:  Independence with exercise equipment Exercise tolerated well No report of cardiac concerns or symptoms Strength training completed today  Goals Unmet:  Not Applicable  Comments: Pt able to follow exercise prescription today without complaint.  Will continue to monitor for progression.    Dr. Emily Filbert is Medical Director for [place].  Dr. Ottie Glazier is Medical Director for [facility] Pulmonary Rehabilitation.

## 2021-06-17 ENCOUNTER — Encounter: Payer: Medicare Other | Admitting: *Deleted

## 2021-06-17 ENCOUNTER — Other Ambulatory Visit: Payer: Self-pay

## 2021-06-17 DIAGNOSIS — I252 Old myocardial infarction: Secondary | ICD-10-CM | POA: Diagnosis not present

## 2021-06-17 DIAGNOSIS — I214 Non-ST elevation (NSTEMI) myocardial infarction: Secondary | ICD-10-CM

## 2021-06-17 NOTE — Progress Notes (Signed)
Daily Session Note  Patient Details  Name: Andrea Phillips MRN: [id number] Date of Birth: 27-Feb-1935 Referring Provider:   Flowsheet Row Cardiac Rehab from 05/18/2021 in [facility] Cardiac and Pulmonary Rehab  Referring Provider Serafina Royals MD       Encounter Date: 06/17/2021  Check In:  Session Check In - 06/17/21 1356       Check-In   Supervising physician immediately available to respond to emergencies See telemetry face sheet for immediately available ER MD    Location [facility]-Cardiac & Pulmonary Rehab    Staff Present Renita Papa, RN BSN;Joseph Evaro, RCP,RRT,BSRT;Jessica Aval[place], [place], RCEP, CCRP, CCET    Virtual Visit No    Medication changes reported     No    Fall or balance concerns reported    No    Warm-up and Cool-down Performed on first and last piece of equipment    Resistance Training Performed Yes    VAD Patient? No    PAD/SET Patient? No      Pain Assessment   Currently in Pain? No/denies                Social History   Tobacco Use  Smoking Status Never  Smokeless Tobacco Never    Goals Met:  Independence with exercise equipment Exercise tolerated well No report of concerns or symptoms today Strength training completed today  Goals Unmet:  Not Applicable  Comments: Pt able to follow exercise prescription today without complaint.  Will continue to monitor for progression.    Dr. Emily Filbert is Medical Director for [place].  Dr. Ottie Glazier is Medical Director for [facility] Pulmonary Rehabilitation.

## 2021-06-23 ENCOUNTER — Other Ambulatory Visit: Payer: Self-pay

## 2021-06-23 DIAGNOSIS — I252 Old myocardial infarction: Secondary | ICD-10-CM | POA: Diagnosis not present

## 2021-06-23 DIAGNOSIS — I214 Non-ST elevation (NSTEMI) myocardial infarction: Secondary | ICD-10-CM

## 2021-06-23 NOTE — Progress Notes (Signed)
Daily Session Note  Patient Details  Name: Andrea Phillips MRN: [id number] Date of Birth: May 01, 1935 Referring Provider:   Flowsheet Row Cardiac Rehab from 05/18/2021 in [facility] Cardiac and Pulmonary Rehab  Referring Provider Serafina Royals MD       Encounter Date: 06/23/2021  Check In:  Session Check In - 06/23/21 1416       Check-In   Supervising physician immediately available to respond to emergencies See telemetry face sheet for immediately available ER MD    Location [facility]-Cardiac & Pulmonary Rehab    Staff Present Birdie Sons, MPA, Nino Glow, MS, ASCM CEP, Exercise Physiologist;Joseph Tessie Fass, [place]    Virtual Visit No    Medication changes reported     No    Fall or balance concerns reported    No    Warm-up and Cool-down Performed on first and last piece of equipment    Resistance Training Performed Yes    VAD Patient? No    PAD/SET Patient? No      Pain Assessment   Currently in Pain? No/denies                Social History   Tobacco Use  Smoking Status Never  Smokeless Tobacco Never    Goals Met:  Independence with exercise equipment Exercise tolerated well No report of concerns or symptoms today Strength training completed today  Goals Unmet:  Not Applicable  Comments: Pt able to follow exercise prescription today without complaint.  Will continue to monitor for progression.    Dr. Emily Filbert is Medical Director for [place].  Dr. Ottie Glazier is Medical Director for [facility] Pulmonary Rehabilitation.

## 2021-06-24 ENCOUNTER — Other Ambulatory Visit: Payer: Self-pay

## 2021-06-24 ENCOUNTER — Encounter: Payer: Medicare Other | Attending: Internal Medicine | Admitting: *Deleted

## 2021-06-24 DIAGNOSIS — I214 Non-ST elevation (NSTEMI) myocardial infarction: Secondary | ICD-10-CM | POA: Insufficient documentation

## 2021-06-24 NOTE — Progress Notes (Signed)
Daily Session Note  Patient Details  Name: Andrea Phillips MRN: [id number] Date of Birth: [birth year] Referring Provider:   Flowsheet Row Cardiac Rehab from 05/18/2021 in [facility] Cardiac and Pulmonary Rehab  Referring Provider Serafina Royals MD       Encounter Date: 06/24/2021  Check In:  Session Check In - 06/24/21 1349       Check-In   Supervising physician immediately available to respond to emergencies See telemetry face sheet for immediately available ER MD    Location [facility]-Cardiac & Pulmonary Rehab    Staff Present Renita Papa, RN BSN;Joseph [place], RCP,RRT,BSRT;Jessica [place], [place], RCEP, CCRP, CCET    Virtual Visit No    Medication changes reported     No    Fall or balance concerns reported    No    Warm-up and Cool-down Performed on first and last piece of equipment    Resistance Training Performed Yes    VAD Patient? No    PAD/SET Patient? No      Pain Assessment   Currently in Pain? No/denies                Social History   Tobacco Use  Smoking Status Never  Smokeless Tobacco Never    Goals Met:  Independence with exercise equipment Exercise tolerated well No report of concerns or symptoms today Strength training completed today  Goals Unmet:  Not Applicable  Comments: Pt able to follow exercise prescription today without complaint.  Will continue to monitor for progression.    Dr. Emily Filbert is Medical Director for [place].  Dr. Ottie Glazier is Medical Director for [facility] Pulmonary Rehabilitation.

## 2021-06-30 ENCOUNTER — Encounter: Payer: Medicare Other | Admitting: *Deleted

## 2021-06-30 ENCOUNTER — Encounter: Payer: Self-pay | Admitting: *Deleted

## 2021-06-30 ENCOUNTER — Other Ambulatory Visit: Payer: Self-pay

## 2021-06-30 DIAGNOSIS — I214 Non-ST elevation (NSTEMI) myocardial infarction: Secondary | ICD-10-CM

## 2021-06-30 NOTE — Progress Notes (Signed)
Daily Session Note  Patient Details  Name: Andrea Phillips MRN: [id number] Date of Birth: [birth year] Referring Provider:   Flowsheet Row Cardiac Rehab from 05/18/2021 in [facility] Cardiac and Pulmonary Rehab  Referring Provider Serafina Royals MD       Encounter Date: 06/30/2021  Check In:  Session Check In - 06/30/21 1537       Check-In   Supervising physician immediately available to respond to emergencies See telemetry face sheet for immediately available ER MD    Location [facility]-Cardiac & Pulmonary Rehab    Staff Present Nyoka Cowden, RN, BSN, [place]lette Pa, MA, RCEP, CCRP, [place], MS, ASCM CEP, Exercise Physiologist;Joseph [place], [place]    Virtual Visit No    Medication changes reported     No    Fall or balance concerns reported    No    Tobacco Cessation No Change    Warm-up and Cool-down Performed on first and last piece of equipment    Resistance Training Performed No    VAD Patient? No      Pain Assessment   Currently in Pain? No/denies    Multiple Pain Sites No                Social History   Tobacco Use  Smoking Status Never  Smokeless Tobacco Never    Goals Met:  Independence with exercise equipment Exercise tolerated well No report of concerns or symptoms today  Goals Unmet:  Not Applicable  Comments: Pt able to follow exercise prescription today without complaint.  Will continue to monitor for progression.    Dr. Emily Filbert is Medical Director for [place].  Dr. Ottie Glazier is Medical Director for [facility] Pulmonary Rehabilitation.

## 2021-06-30 NOTE — Progress Notes (Signed)
Cardiac Individual Treatment Plan  Patient Details  Name: NIRVI BOEHLER MRN: [id number] Date of Birth: 15-May-1935 Referring Provider:   Flowsheet Row Cardiac Rehab from 05/18/2021 in [facility] Cardiac and Pulmonary Rehab  Referring Provider Serafina Royals MD       Initial Encounter Date:  Flowsheet Row Cardiac Rehab from 05/18/2021 in [facility] Cardiac and Pulmonary Rehab  Date 05/18/21       Visit Diagnosis: NSTEMI (non-ST elevated myocardial infarction) [facility])  Patient's Home Medications on Admission:  Current Outpatient Medications:    acetaminophen (TYLENOL) 325 MG tablet, Take 325-650 mg by mouth every 6 (six) hours as needed for moderate pain., Disp: , Rfl:    albuterol (VENTOLIN HFA) 108 (90 Base) MCG/ACT inhaler, Inhale 2 puffs into the lungs every 6 (six) hours as needed for wheezing or shortness of breath., Disp: , Rfl:    Cholecalciferol (VITAMIN D3) 1.25 MG (50000 UT) TABS, Take 1,000 Units by mouth once a week., Disp: , Rfl:    fluocinonide (LIDEX) 0.05 % external solution, Apply 1 application topically daily as needed (skin irritations)., Disp: , Rfl:    fluticasone (FLONASE) 50 MCG/ACT nasal spray, Place 1 spray into both nostrils 2 (two) times daily as needed for allergies or rhinitis., Disp: , Rfl:    furosemide (LASIX) 40 MG tablet, Take 1 tablet (40 mg total) by mouth daily. (Patient taking differently: Take 20 mg by mouth daily.), Disp: 90 tablet, Rfl: 3   guaiFENesin (MUCINEX) 600 MG 12 hr tablet, Take 1 tablet (600 mg total) by mouth 2 (two) times daily., Disp: , Rfl:    HYDROcodone-acetaminophen (NORCO/VICODIN) 5-325 MG tablet, Take 1-2 tablets by mouth every 6 (six) hours as needed for severe pain. (Patient not taking: Reported on 06/09/2021), Disp: 14 tablet, Rfl: 0   icosapent Ethyl (VASCEPA) 1 g capsule, Take 2 g by mouth 2 (two) times daily with a meal., Disp: , Rfl:    levothyroxine (SYNTHROID) 112 MCG tablet, Take 112 mcg by mouth daily., Disp: , Rfl:     Magnesium Oxide 250 MG TABS, Take 1-2 tablets by mouth daily as needed., Disp: , Rfl:    metoprolol succinate (TOPROL-XL) 50 MG 24 hr tablet, Take 1 tablet (50 mg total) by mouth daily. Take with or immediately following a meal. (Patient taking differently: Take 25 mg by mouth daily. Take with or immediately following a meal.), Disp: 90 tablet, Rfl: 1   mometasone-formoterol (DULERA) 200-5 MCG/ACT AERO, Inhale 2 puffs into the lungs 2 (two) times daily., Disp: 1 each, Rfl: 1   omeprazole (PRILOSEC OTC) 20 MG tablet, Take 2 tablets (40 mg total) by mouth daily., Disp: 5 tablet, Rfl: 0   telmisartan (MICARDIS) 40 MG tablet, Take 40 mg by mouth daily., Disp: , Rfl:   Past Medical History: Past Medical History:  Diagnosis Date   Arthritis    ra   Asthma    CHF (congestive heart failure) (HCC)    Dysrhythmia    Edema extremities    GERD (gastroesophageal reflux disease)    Gout    Headache    History of hiatal hernia    Hypertension    Hypothyroidism    Shortness of breath dyspnea    Sleep apnea     Tobacco Use: Social History   Tobacco Use  Smoking Status Never  Smokeless Tobacco Never    Labs: Recent Review Flowsheet Data     Labs for ITP Cardiac and Pulmonary Rehab Latest Ref Rng & Units 04/02/2021  Cholestrol 0 - 200 mg/dL 170   LDLCALC 0 - 99 mg/dL 112(H)   HDL >40 mg/dL 46   Trlycerides <150 mg/dL 60        Exercise Target Goals: Exercise Program Goal: Individual exercise prescription set using results from initial 6 min walk test and THRR while considering  patient's activity barriers and safety.   Exercise Prescription Goal: Initial exercise prescription builds to 30-45 minutes a day of aerobic activity, 2-3 days per week.  Home exercise guidelines will be given to patient during program as part of exercise prescription that the participant will acknowledge.   Education: Aerobic Exercise: - Group verbal and visual presentation on the components of exercise  prescription. Introduces F.I.T.T principle from ACSM for exercise prescriptions.  Reviews F.I.T.T. principles of aerobic exercise including progression. Written material given at graduation. Flowsheet Row Cardiac Rehab from 06/23/2021 in [facility] Cardiac and Pulmonary Rehab  Date 06/16/21  Educator AS  Instruction Review Code 1- Verbalizes Understanding       Education: Resistance Exercise: - Group verbal and visual presentation on the components of exercise prescription. Introduces F.I.T.T principle from ACSM for exercise prescriptions  Reviews F.I.T.T. principles of resistance exercise including progression. Written material given at graduation. Flowsheet Row Cardiac Rehab from 06/23/2021 in [facility] Cardiac and Pulmonary Rehab  Date 06/23/21  Educator AS  Instruction Review Code 1- Verbalizes Understanding        Education: Exercise & Equipment Safety: - Individual verbal instruction and demonstration of equipment use and safety with use of the equipment. Flowsheet Row Cardiac Rehab from 06/23/2021 in [facility] Cardiac and Pulmonary Rehab  Education need identified 05/18/21  Date 05/18/21  Educator [place]  Instruction Review Code 1- Verbalizes Understanding       Education: Exercise Physiology & General Exercise Guidelines: - Group verbal and written instruction with models to review the exercise physiology of the cardiovascular system and associated critical values. Provides general exercise guidelines with specific guidelines to those with heart or lung disease.  Flowsheet Row Cardiac Rehab from 06/23/2021 in [facility] Cardiac and Pulmonary Rehab  Date 06/09/21  Educator AS  Instruction Review Code 1- Verbalizes Understanding       Education: Flexibility, Balance, Mind/Body Relaxation: - Group verbal and visual presentation with interactive activity on the components of exercise prescription. Introduces F.I.T.T principle from ACSM for exercise prescriptions. Reviews F.I.T.T. principles of  flexibility and balance exercise training including progression. Also discusses the mind body connection.  Reviews various relaxation techniques to help reduce and manage stress (i.e. Deep breathing, progressive muscle relaxation, and visualization). Balance handout provided to take home. Written material given at graduation.   Activity Barriers & Risk Stratification:  Activity Barriers & Cardiac Risk Stratification - 05/18/21 1152       Activity Barriers & Cardiac Risk Stratification   Activity Barriers Left Knee Replacement;Deconditioning;Balance Concerns;Muscular Weakness    Cardiac Risk Stratification Moderate             6 Minute Walk:  6 Minute Walk     Row Name 05/18/21 1155         6 Minute Walk   Phase Initial     Distance 1000 feet     Walk Time 6 minutes     # of Rest Breaks 0     MPH 1.89     METS 1.73     RPE 11     Perceived Dyspnea  1     VO2 Peak 6.06     Symptoms Yes (  comment)     Comments Fatigued, slight dizziness- resolved with rest     Resting HR 62 bpm     Resting BP 140/74     Resting Oxygen Saturation  96 %     Exercise Oxygen Saturation  during 6 min walk 96 %     Max Ex. HR 99 bpm     Max Ex. BP 182/70  RCK 150/72     2 Minute Post BP 140/72              Oxygen Initial Assessment:   Oxygen Re-Evaluation:   Oxygen Discharge (Final Oxygen Re-Evaluation):   Initial Exercise Prescription:  Initial Exercise Prescription - 05/18/21 1500       Date of Initial Exercise RX and Referring Provider   Date 05/18/21    Referring Provider Serafina Royals MD      NuStep   Level 1    SPM 80    Minutes 15    METs 1.7      Arm Ergometer   Level 1    RPM 30    Minutes 15    METs 1.7      Biostep-RELP   Level 1    SPM 50    Minutes 15    METs 1.7      Track   Laps 16    Minutes 15    METs 1.87      Prescription Details   Frequency (times per week) 3    Duration Progress to 30 minutes of continuous aerobic without  signs/symptoms of physical distress      Intensity   THRR 40-80% of Max Heartrate 91-121    Ratings of Perceived Exertion 11-13    Perceived Dyspnea 0-4      Progression   Progression Continue to progress workloads to maintain intensity without signs/symptoms of physical distress.      Resistance Training   Training Prescription Yes    Weight 3 lb    Reps 10-15             Perform Capillary Blood Glucose checks as needed.  Exercise Prescription Changes:   Exercise Prescription Changes     Row Name 05/18/21 1500 05/31/21 1300 06/03/21 1400 06/14/21 1000 06/29/21 1300     Response to Exercise   Blood Pressure (Admit) 140/74 122/62 -- 138/60 148/68   Blood Pressure (Exercise) 182/70 162/70 -- 162/68 --   Blood Pressure (Exit) 140/72 122/64 -- 148/60 142/64   Heart Rate (Admit) 62 bpm 81 bpm -- 70 bpm 78 bpm   Heart Rate (Exercise) 99 bpm 113 bpm -- 101 bpm 119 bpm   Heart Rate (Exit) 66 bpm 79 bpm -- 65 bpm 87 bpm   Oxygen Saturation (Admit) 96 % -- -- -- --   Oxygen Saturation (Exercise) 96 % -- -- -- --   Oxygen Saturation (Exit) 96 % -- -- -- --   Rating of Perceived Exertion (Exercise) 11 14 -- 15 15   Perceived Dyspnea (Exercise) 1 -- -- -- --   Symptoms fatigue, slight dizziness -- -- none none   Comments walk test results third day exercise -- -- --   Duration -- Progress to 30 minutes of  aerobic without signs/symptoms of physical distress -- Progress to 30 minutes of  aerobic without signs/symptoms of physical distress Progress to 30 minutes of  aerobic without signs/symptoms of physical distress   Intensity -- THRR unchanged -- THRR unchanged THRR unchanged  Progression   Progression -- Continue to progress workloads to maintain intensity without signs/symptoms of physical distress. -- Continue to progress workloads to maintain intensity without signs/symptoms of physical distress. Continue to progress workloads to maintain intensity without signs/symptoms of  physical distress.   Average METs -- 2.2 -- 2.51 2.3     Resistance Training   Training Prescription -- Yes -- Yes Yes   Weight -- 3 lb -- 3 lb 3 lb   Reps -- 10-15 -- 10-15 10-15     NuStep   Level -- 1 -- -- --   SPM -- 80 -- -- --   Minutes -- 15 -- -- --   METs -- 2 -- -- --     Arm Ergometer   Level -- -- -- 1 --   Minutes -- -- -- 15 --   METs -- -- -- 2 --     Biostep-RELP   Level -- -- -- 2 2   Minutes -- -- -- 15 15   METs -- -- -- 3 2     Track   Laps -- 26 -- 30 30   Minutes -- 15 -- 15 15   METs -- 2.4 -- 2.63 2.63     Home Exercise Plan   Plans to continue exercise at -- -- Home (comment)  walking, staff videos Home (comment)  walking, staff videos Home (comment)  walking, staff videos   Frequency -- -- Add 2 additional days to program exercise sessions. Add 2 additional days to program exercise sessions. Add 2 additional days to program exercise sessions.   Initial Home Exercises Provided -- -- 06/03/21 06/03/21 06/03/21            Exercise Comments:   Exercise Comments     Row Name 05/24/21 1414           Exercise Comments First full day of exercise!  Patient was oriented to gym and equipment including functions, settings, policies, and procedures.  Patient's individual exercise prescription and treatment plan were reviewed.  All starting workloads were established based on the results of the 6 minute walk test done at initial orientation visit.  The plan for exercise progression was also introduced and progression will be customized based on patient's performance and goals.                Exercise Goals and Review:   Exercise Goals     Row Name 05/18/21 1523             Exercise Goals   Increase Physical Activity Yes       Intervention Provide advice, education, support and counseling about physical activity/exercise needs.;Develop an individualized exercise prescription for aerobic and resistive training based on initial evaluation  findings, risk stratification, comorbidities and participant's personal goals.       Expected Outcomes Short Term: Attend rehab on a regular basis to increase amount of physical activity.;Long Term: Add in home exercise to make exercise part of routine and to increase amount of physical activity.;Long Term: Exercising regularly at least 3-5 days a week.       Increase Strength and Stamina Yes       Intervention Provide advice, education, support and counseling about physical activity/exercise needs.;Develop an individualized exercise prescription for aerobic and resistive training based on initial evaluation findings, risk stratification, comorbidities and participant's personal goals.       Expected Outcomes Short Term: Increase workloads from initial exercise prescription for resistance, speed, and  METs.;Short Term: Perform resistance training exercises routinely during rehab and add in resistance training at home;Long Term: Improve cardiorespiratory fitness, muscular endurance and strength as measured by increased METs and functional capacity (6MWT)       Able to understand and use rate of perceived exertion (RPE) scale Yes       Intervention Provide education and explanation on how to use RPE scale       Expected Outcomes Short Term: Able to use RPE daily in rehab to express subjective intensity level;Long Term:  Able to use RPE to guide intensity level when exercising independently       Able to understand and use Dyspnea scale Yes       Intervention Provide education and explanation on how to use Dyspnea scale       Expected Outcomes Short Term: Able to use Dyspnea scale daily in rehab to express subjective sense of shortness of breath during exertion;Long Term: Able to use Dyspnea scale to guide intensity level when exercising independently       Knowledge and understanding of Target Heart Rate Range (THRR) Yes       Intervention Provide education and explanation of THRR including how the numbers  were predicted and where they are located for reference       Expected Outcomes Short Term: Able to state/look up THRR;Short Term: Able to use daily as guideline for intensity in rehab;Long Term: Able to use THRR to govern intensity when exercising independently       Able to check pulse independently Yes       Intervention Review the importance of being able to check your own pulse for safety during independent exercise;Provide education and demonstration on how to check pulse in carotid and radial arteries.       Expected Outcomes Short Term: Able to explain why pulse checking is important during independent exercise;Long Term: Able to check pulse independently and accurately       Understanding of Exercise Prescription Yes       Intervention Provide education, explanation, and written materials on patient's individual exercise prescription       Expected Outcomes Short Term: Able to explain program exercise prescription;Long Term: Able to explain home exercise prescription to exercise independently                Exercise Goals Re-Evaluation :  Exercise Goals Re-Evaluation     Row Name 05/24/21 1414 05/31/21 1307 06/03/21 1439 06/14/21 1031 06/17/21 1405     Exercise Goal Re-Evaluation   Exercise Goals Review Increase Physical Activity;Able to understand and use rate of perceived exertion (RPE) scale;Knowledge and understanding of Target Heart Rate Range (THRR);Understanding of Exercise Prescription;Increase Strength and Stamina;Able to understand and use Dyspnea scale;Able to check pulse independently Increase Physical Activity;Increase Strength and Stamina Increase Physical Activity;Increase Strength and Stamina;Able to understand and use rate of perceived exertion (RPE) scale;Able to understand and use Dyspnea scale;Knowledge and understanding of Target Heart Rate Range (THRR);Able to check pulse independently;Understanding of Exercise Prescription Increase Physical Activity;Increase  Strength and Stamina Increase Physical Activity;Increase Strength and Stamina;Understanding of Exercise Prescription   Comments Reviewed RPE and dyspnea scales, THR and program prescription with pt today.  Pt voiced understanding and was given a copy of goals to take home. Jashanti has tolerated exercise well iin her first sessions.  She works in [employer] range.Staff will monitor progress. Reviewed home exercise with pt today.  Pt plans to walk at home and use staff videos for exercise.  Reviewed THR, pulse, RPE, sign and symptoms, pulse oximetery and when to call 911 or MD.  Also discussed weather considerations and indoor options.  Pt voiced understanding. Terrea continues to do well in rehab.  She is tolerating the arm crank well and has increased her number of laps on the track to 30. Will continue to monitor. Stephenie is doing well in rehab. She is doing some exercise on her off days by walking. She is also doing stretches and PT exercises each day. She is feeling stronger and better overall.   Expected Outcomes Short: Use RPE daily to regulate intensity. Long: Follow program prescription in THR. Short: attend consistently Long: improve overall stamina Short: Start to add in walking at home Long: Continue to improve stamina. Short: Continue to increase laps on the track Long: Continue to increase overall MET level Short: Continue to add in exercise at home Long: COnitnue to improve stamina.    [place] Name 06/29/21 1347             Exercise Goal Re-Evaluation   Exercise Goals Review Increase Physical Activity;Increase Strength and Stamina       Comments Marrianne attends consistently and reaches THR range.  She has increased to 4 lb for strength work.  Staff will monitor progress.       Expected Outcomes Short:  continue to attend consistently Long:  build overall stamina                Discharge Exercise Prescription (Final Exercise Prescription Changes):  Exercise Prescription Changes - 06/29/21  1300       Response to Exercise   Blood Pressure (Admit) 148/68    Blood Pressure (Exit) 142/64    Heart Rate (Admit) 78 bpm    Heart Rate (Exercise) 119 bpm    Heart Rate (Exit) 87 bpm    Rating of Perceived Exertion (Exercise) 15    Symptoms none    Duration Progress to 30 minutes of  aerobic without signs/symptoms of physical distress    Intensity THRR unchanged      Progression   Progression Continue to progress workloads to maintain intensity without signs/symptoms of physical distress.    Average METs 2.3      Resistance Training   Training Prescription Yes    Weight 3 lb    Reps 10-15      Biostep-RELP   Level 2    Minutes 15    METs 2      Track   Laps 30    Minutes 15    METs 2.63      Home Exercise Plan   Plans to continue exercise at Home (comment)   walking, staff videos   Frequency Add 2 additional days to program exercise sessions.    Initial Home Exercises Provided 06/03/21             Nutrition:  Target Goals: Understanding of nutrition guidelines, daily intake of sodium '1500mg'$ , cholesterol '200mg'$ , calories 30% from fat and 7% or less from saturated fats, daily to have 5 or more servings of fruits and vegetables.  Education: All About Nutrition: -Group instruction provided by verbal, written material, interactive activities, discussions, models, and posters to present general guidelines for heart healthy nutrition including fat, fiber, MyPlate, the role of sodium in heart healthy nutrition, utilization of the nutrition label, and utilization of this knowledge for meal planning. Follow up email sent as well. Written material given at graduation.   Biometrics:  Pre Biometrics -  05/18/21 1151       Pre Biometrics   Height 5' 1.5" (1.562 m)    Weight 162 lb 14.4 oz (73.9 kg)    BMI (Calculated) 30.29    Single Leg Stand 1.18 seconds              Nutrition Therapy Plan and Nutrition Goals:  Nutrition Therapy & Goals - 06/07/21 1331        Nutrition Therapy   Diet Heart healthy, low Na    Protein (specify units) 60g    Fiber 25 grams    Whole Grain Foods 3 servings    Saturated Fats 12 max. grams    Fruits and Vegetables 8 servings/day    Sodium 1.5 grams      Personal Nutrition Goals   Nutrition Goal ST: try mixing white and brown rice, eat a rainbow of fruits and vegetables in a weeks time, add nuts and fruit to AM oatmeal LT: LDL cholesterol and triglycerides in normal limits.    Comments Medications first thing in the morning. B: oatmeal or scrambled egg and piece of toast and sometimes bacon L: sandwich D: meat (chicken, hamburger, pork chops) and two vegetables (most all vegetables). She uses wheat bread, she does not cook with salt, olive and canola oil and some fatback on certain items. Drinks: water, coffee (black), unsweet tea. Discussed heart healthy eating and how to lower blood LDL cholesterol and triglycerides.      Intervention Plan   Intervention Prescribe, educate and counsel regarding individualized specific dietary modifications aiming towards targeted core components such as weight, hypertension, lipid management, diabetes, heart failure and other comorbidities.;Nutrition handout(s) given to patient.    Expected Outcomes Short Term Goal: Understand basic principles of dietary content, such as calories, fat, sodium, cholesterol and nutrients.;Short Term Goal: A plan has been developed with personal nutrition goals set during dietitian appointment.;Long Term Goal: Adherence to prescribed nutrition plan.             Nutrition Assessments:  MEDIFICTS Score Key: ?70 Need to make dietary changes  40-70 Heart Healthy Diet ? 40 Therapeutic Level Cholesterol Diet  Flowsheet Row Cardiac Rehab from 05/18/2021 in [facility] Cardiac and Pulmonary Rehab  Picture Your Plate Total Score on Admission 54      Picture Your Plate Scores: <63 Unhealthy dietary pattern with much room for improvement. 41-50 Dietary  pattern unlikely to meet recommendations for good health and room for improvement. 51-60 More healthful dietary pattern, with some room for improvement.  >60 Healthy dietary pattern, although there may be some specific behaviors that could be improved.    Nutrition Goals Re-Evaluation:  Nutrition Goals Re-Evaluation     [place] Name 06/17/21 1410             Goals   Nutrition Goal ST: try mixing white and brown rice, eat a rainbow of fruits and vegetables in a weeks time, add nuts and fruit to AM oatmeal LT: LDL cholesterol and triglycerides in normal limits.       Comment Chantia is doing well in rehab.  However, she is concerned that her weight is creeping up. She is trying to eat more healthy options.  We talked about whether she is eating more now that she is exercising more.  She continue to work on her diet       Expected Outcome Short: Make sure she is getting enough protein Long: Continue to eat heart healthy  Nutrition Goals Discharge (Final Nutrition Goals Re-Evaluation):  Nutrition Goals Re-Evaluation - 06/17/21 1410       Goals   Nutrition Goal ST: try mixing white and brown rice, eat a rainbow of fruits and vegetables in a weeks time, add nuts and fruit to AM oatmeal LT: LDL cholesterol and triglycerides in normal limits.    Comment Daphine is doing well in rehab.  However, she is concerned that her weight is creeping up. She is trying to eat more healthy options.  We talked about whether she is eating more now that she is exercising more.  She continue to work on her diet    Expected Outcome Short: Make sure she is getting enough protein Long: Continue to eat heart healthy             Psychosocial: Target Goals: Acknowledge presence or absence of significant depression and/or stress, maximize coping skills, provide positive support system. Participant is able to verbalize types and ability to use techniques and skills needed for reducing stress and  depression.   Education: Stress, Anxiety, and Depression - Group verbal and visual presentation to define topics covered.  Reviews how body is impacted by stress, anxiety, and depression.  Also discusses healthy ways to reduce stress and to treat/manage anxiety and depression.  Written material given at graduation. Flowsheet Row Cardiac Rehab from 06/23/2021 in [facility] Cardiac and Pulmonary Rehab  Date 06/02/21  Educator AS  Instruction Review Code 1- Verbalizes Understanding       Education: Sleep Hygiene -Provides group verbal and written instruction about how sleep can affect your health.  Define sleep hygiene, discuss sleep cycles and impact of sleep habits. Review good sleep hygiene tips.    Initial Review & Psychosocial Screening:  Initial Psych Review & Screening - 05/07/21 1411       Initial Review   Current issues with None Identified      Family Dynamics   Good Support System? Yes   grandchildren     Barriers   Psychosocial barriers to participate in program There are no identifiable barriers or psychosocial needs.;The patient should benefit from training in stress management and relaxation.      Screening Interventions   Interventions Encouraged to exercise;Provide feedback about the scores to participant;To provide support and resources with identified psychosocial needs    Expected Outcomes Short Term goal: Utilizing psychosocial counselor, staff and physician to assist with identification of specific Stressors or current issues interfering with healing process. Setting desired goal for each stressor or current issue identified.;Long Term Goal: Stressors or current issues are controlled or eliminated.;Long Term goal: The participant improves quality of Life and PHQ9 Scores as seen by post scores and/or verbalization of changes;Short Term goal: Identification and review with participant of any Quality of Life or Depression concerns found by scoring the questionnaire.              Quality of Life Scores:   Quality of Life - 05/18/21 1520       Quality of Life   Select Quality of Life      Quality of Life Scores   Health/Function Pre 20 %    Socioeconomic Pre 24.1 %    Psych/Spiritual Pre 25 %    Family Pre 24 %    GLOBAL Pre 22.31 %            Scores of 19 and below usually indicate a poorer quality of life in these areas.  A difference of  2-3 points is a clinically meaningful difference.  A difference of 2-3 points in the total score of the Quality of Life Index has been associated with significant improvement in overall quality of life, self-image, physical symptoms, and general health in studies assessing change in quality of life.  PHQ-9: Recent Review Flowsheet Data     Depression screen [facility] 2/9 05/18/2021   Decreased Interest 1   Down, Depressed, Hopeless 1   PHQ - 2 Score 2   Altered sleeping 0   Tired, decreased energy 1   Change in appetite 0   Feeling bad or failure about yourself  0   Trouble concentrating 0   Moving slowly or fidgety/restless 0   Suicidal thoughts 0   PHQ-9 Score 3   Difficult doing work/chores Not difficult at all      Interpretation of Total Score  Total Score Depression Severity:  1-4 = Minimal depression, 5-9 = Mild depression, 10-14 = Moderate depression, 15-19 = Moderately severe depression, 20-27 = Severe depression   Psychosocial Evaluation and Intervention:  Psychosocial Evaluation - 05/07/21 1413       Psychosocial Evaluation & Interventions   Interventions Encouraged to exercise with the program and follow exercise prescription    Comments Ms. Costello is coming to cardiac rehab post NSTEMI. She states she is feeling pretty good, she completed hh pt. Her granddaughter is very helpful, looking out for her and helping her with meals. She doesn't report any current stressors, just the "usual" and that she manages it well. She has had a history of balance concerns so she is glad that this program  may help with that. She takes all her medicine like she is supposed to, weighs daily, checks her BP  regularly and wears her CPAP nightly, so she is ready to add cardiac rehab to her list of things to do to make herself feel better.    Expected Outcomes Short: attend cardiac rehab for education and exercise. Long: develop & maintian positive self care habits    Continue Psychosocial Services  Follow up required by staff             Psychosocial Re-Evaluation:  Psychosocial Re-Evaluation     [place] Name 06/17/21 1408             Psychosocial Re-Evaluation   Current issues with Current Stress Concerns       Comments Aiyannah is doing well in rehab. She has three grandkids that live with her and school is getting ready to start so mornings will get a little more hectic.  She does her best to stay out of the way.  She has a new grandbaby turning three months this month.  She sleeps well most days.       Expected Outcomes Short: Adjust to new school schedule Long: Contiue to focus on positive       Interventions Encouraged to attend Cardiac Rehabilitation for the exercise       Continue Psychosocial Services  Follow up required by staff                Psychosocial Discharge (Final Psychosocial Re-Evaluation):  Psychosocial Re-Evaluation - 06/17/21 1408       Psychosocial Re-Evaluation   Current issues with Current Stress Concerns    Comments Kataleia is doing well in rehab. She has three grandkids that live with her and school is getting ready to start so mornings will get a little more hectic.  She does her best to  stay out of the way.  She has a new grandbaby turning three months this month.  She sleeps well most days.    Expected Outcomes Short: Adjust to new school schedule Long: Contiue to focus on positive    Interventions Encouraged to attend Cardiac Rehabilitation for the exercise    Continue Psychosocial Services  Follow up required by staff             Vocational  Rehabilitation: Provide vocational rehab assistance to qualifying candidates.   Vocational Rehab Evaluation & Intervention:   Education: Education Goals: Education classes will be provided on a variety of topics geared toward better understanding of heart health and risk factor modification. Participant will state understanding/return demonstration of topics presented as noted by education test scores.  Learning Barriers/Preferences:  Learning Barriers/Preferences - 05/07/21 1411       Learning Barriers/Preferences   Learning Barriers None    Learning Preferences None             General Cardiac Education Topics:  AED/CPR: - Group verbal and written instruction with the use of models to demonstrate the basic use of the AED with the basic ABC's of resuscitation.   Anatomy and Cardiac Procedures: - Group verbal and visual presentation and models provide information about basic cardiac anatomy and function. Reviews the testing methods done to diagnose heart disease and the outcomes of the test results. Describes the treatment choices: Medical Management, Angioplasty, or Coronary Bypass Surgery for treating various heart conditions including Myocardial Infarction, Angina, Valve Disease, and Cardiac Arrhythmias.  Written material given at graduation. Flowsheet Row Cardiac Rehab from 06/23/2021 in [facility] Cardiac and Pulmonary Rehab  Date 06/23/21  Educator [facility]  Instruction Review Code 1- Verbalizes Understanding       Medication Safety: - Group verbal and visual instruction to review commonly prescribed medications for heart and lung disease. Reviews the medication, class of the drug, and side effects. Includes the steps to properly store meds and maintain the prescription regimen.  Written material given at graduation.   Intimacy: - Group verbal instruction through game format to discuss how heart and lung disease can affect sexual intimacy. Written material given at  graduation.. Flowsheet Row Cardiac Rehab from 06/23/2021 in [facility] Cardiac and Pulmonary Rehab  Date 06/16/21  Educator AS  Instruction Review Code 1- Verbalizes Understanding       Know Your Numbers and Heart Failure: - Group verbal and visual instruction to discuss disease risk factors for cardiac and pulmonary disease and treatment options.  Reviews associated critical values for Overweight/Obesity, Hypertension, Cholesterol, and Diabetes.  Discusses basics of heart failure: signs/symptoms and treatments.  Introduces Heart Failure Zone chart for action plan for heart failure.  Written material given at graduation.   Infection Prevention: - Provides verbal and written material to individual with discussion of infection control including proper hand washing and proper equipment cleaning during exercise session. Flowsheet Row Cardiac Rehab from 06/23/2021 in [facility] Cardiac and Pulmonary Rehab  Education need identified 05/18/21  Date 05/18/21  Educator [place]  Instruction Review Code 1- Verbalizes Understanding       Falls Prevention: - Provides verbal and written material to individual with discussion of falls prevention and safety. Flowsheet Row Cardiac Rehab from 06/23/2021 in [facility] Cardiac and Pulmonary Rehab  Education need identified 05/18/21  Date 05/18/21  Educator KL  Instruction Review Code 1- Verbalizes Understanding       Other: -Provides group and verbal instruction on various topics (see comments)  Knowledge Questionnaire Score:  Knowledge Questionnaire Score - 05/18/21 1501       Knowledge Questionnaire Score   Pre Score 22/26: Angina, Exercise             Core Components/Risk Factors/Patient Goals at Admission:  Personal Goals and Risk Factors at Admission - 05/18/21 1524       Core Components/Risk Factors/Patient Goals on Admission    Weight Management Yes;Weight Loss    Intervention Weight Management: Develop a combined nutrition and exercise program  designed to reach desired caloric intake, while maintaining appropriate intake of nutrient and fiber, sodium and fats, and appropriate energy expenditure required for the weight goal.;Weight Management: Provide education and appropriate resources to help participant work on and attain dietary goals.;Weight Management/Obesity: Establish reasonable short term and long term weight goals.;Obesity: Provide education and appropriate resources to help participant work on and attain dietary goals.    Admit Weight 162 lb (73.5 kg)    Goal Weight: Short Term 157 lb (71.2 kg)    Goal Weight: Long Term 152 lb (68.9 kg)    Expected Outcomes Short Term: Continue to assess and modify interventions until short term weight is achieved;Long Term: Adherence to nutrition and physical activity/exercise program aimed toward attainment of established weight goal;Weight Loss: Understanding of general recommendations for a balanced deficit meal plan, which promotes 1-2 lb weight loss per week and includes a negative energy balance of [phone] kcal/d;Understanding recommendations for meals to include 15-35% energy as protein, 25-35% energy from fat, 35-60% energy from carbohydrates, less than $RemoveB'200mg'shsUYFPd$  of dietary cholesterol, 20-35 gm of total fiber daily;Understanding of distribution of calorie intake throughout the day with the consumption of 4-5 meals/snacks    Hypertension Yes    Intervention Provide education on lifestyle modifcations including regular physical activity/exercise, weight management, moderate sodium restriction and increased consumption of fresh fruit, vegetables, and low fat dairy, alcohol moderation, and smoking cessation.;Monitor prescription use compliance.    Expected Outcomes Short Term: Continued assessment and intervention until BP is < 140/30mm HG in hypertensive participants. < 130/76mm HG in hypertensive participants with diabetes, heart failure or chronic kidney disease.;Long Term: Maintenance of blood  pressure at goal levels.    Lipids Yes    Intervention Provide education and support for participant on nutrition & aerobic/resistive exercise along with prescribed medications to achieve LDL '70mg'$ , HDL >$Remo'40mg'MHEuB$ .    Expected Outcomes Short Term: Participant states understanding of desired cholesterol values and is compliant with medications prescribed. Participant is following exercise prescription and nutrition guidelines.;Long Term: Cholesterol controlled with medications as prescribed, with individualized exercise RX and with personalized nutrition plan. Value goals: LDL < $Rem'70mg'uccz$ , HDL > 40 mg.             Education:Diabetes - Individual verbal and written instruction to review signs/symptoms of diabetes, desired ranges of glucose level fasting, after meals and with exercise. Acknowledge that pre and post exercise glucose checks will be done for 3 sessions at entry of program.   Core Components/Risk Factors/Patient Goals Review:   Goals and Risk Factor Review     Row Name 06/17/21 1436             Core Components/Risk Factors/Patient Goals Review   Personal Goals Review Weight Management/Obesity;Hypertension;Lipids       Review Shani is doing well in rehab.  Her weight has been creeping up and she really wants to try to lose!  We talked about watching her protein and limiting fats.  Her pressures have been good  and she does check them at home. She is feeling good with taking her medications and feels well managed.       Expected Outcomes Short: Work on weight loss.  Long: Continue to monitor risk factors.                Core Components/Risk Factors/Patient Goals at Discharge (Final Review):   Goals and Risk Factor Review - 06/17/21 1436       Core Components/Risk Factors/Patient Goals Review   Personal Goals Review Weight Management/Obesity;Hypertension;Lipids    Review Tannia is doing well in rehab.  Her weight has been creeping up and she really wants to try to lose!  We  talked about watching her protein and limiting fats.  Her pressures have been good and she does check them at home. She is feeling good with taking her medications and feels well managed.    Expected Outcomes Short: Work on weight loss.  Long: Continue to monitor risk factors.             ITP Comments:  ITP Comments     Row Name 05/07/21 1417 05/18/21 1142 05/24/21 1414 06/02/21 0745 06/07/21 1426   ITP Comments Initial telephone orientation completed. Diagnosis can be found in [facility] 6/9. EP orientation scheduled for Tuesday 7/25 at 10am Completed 6MWT and gym orientation. Initial ITP created and sent for review to Dr. Emily Filbert, Medical Director. First full day of exercise!  Patient was oriented to gym and equipment including functions, settings, policies, and procedures.  Patient's individual exercise prescription and treatment plan were reviewed.  All starting workloads were established based on the results of the 6 minute walk test done at initial orientation visit.  The plan for exercise progression was also introduced and progression will be customized based on patient's performance and goals. 30 Day review completed. Medical Director ITP review done, changes made as directed, and signed approval by Medical Director. Completed initial RD consultation    [place] Name 06/30/21 0644           ITP Comments 30 Day review completed. Medical Director ITP review done, changes made as directed, and signed approval by Medical Director.                Comments:

## 2021-07-01 ENCOUNTER — Encounter: Payer: Medicare Other | Admitting: *Deleted

## 2021-07-01 DIAGNOSIS — I214 Non-ST elevation (NSTEMI) myocardial infarction: Secondary | ICD-10-CM

## 2021-07-01 NOTE — Progress Notes (Signed)
Daily Session Note  Patient Details  Name: KALAN RINN MRN: [id number] Date of Birth: 04/21/1935 Referring Provider:   Flowsheet Row Cardiac Rehab from 05/18/2021 in [facility] Cardiac and Pulmonary Rehab  Referring Provider Serafina Royals MD       Encounter Date: 07/01/2021  Check In:  Session Check In - 07/01/21 1403       Check-In   Supervising physician immediately available to respond to emergencies See telemetry face sheet for immediately available ER MD    Location [facility]-Cardiac & Pulmonary Rehab    Staff Present Renita Papa, RN BSN;Joseph Canovanas, RCP,RRT,BSRT;Jessica [place], [place], RCEP, CCRP, CCET    Virtual Visit No    Medication changes reported     No    Fall or balance concerns reported    No    Warm-up and Cool-down Performed on first and last piece of equipment    Resistance Training Performed Yes    VAD Patient? No    PAD/SET Patient? No      Pain Assessment   Currently in Pain? No/denies                Social History   Tobacco Use  Smoking Status Never  Smokeless Tobacco Never    Goals Met:  Independence with exercise equipment Exercise tolerated well No report of concerns or symptoms today Strength training completed today  Goals Unmet:  Not Applicable  Comments: Pt able to follow exercise prescription today without complaint.  Will continue to monitor for progression.    Dr. Emily Filbert is Medical Director for [place].  Dr. Ottie Glazier is Medical Director for [facility] Pulmonary Rehabilitation.

## 2021-07-05 ENCOUNTER — Other Ambulatory Visit: Payer: Self-pay

## 2021-07-05 DIAGNOSIS — I214 Non-ST elevation (NSTEMI) myocardial infarction: Secondary | ICD-10-CM

## 2021-07-05 NOTE — Progress Notes (Signed)
Incomplete Session Note  Patient Details  Name: Andrea Phillips MRN: [id number] Date of Birth: 1934-11-26 Referring Provider:   Flowsheet Row Cardiac Rehab from 05/18/2021 in [facility] Cardiac and Pulmonary Rehab  Referring Provider Arnoldo Hooker MD       Encounter Date: 07/05/2021  [name] was unable to complete today's session. She stated she wasn't feeling well and wanted to stop exercising. She asked me to call her doctor (Dr. Welton Flakes) to be seen today. I contacted the doctor's office and they were able to see her directly. [name] was transported upstairs via wheelchair to ensure her safety and agreed to have the courtesy car take her to her doctor appointment. [name]' vital signs were within her normal limits and without any complaints of pain.     Dr. Bethann Punches is Medical Director for [facility] Cardiac Rehabilitation.  Dr. Vida Rigger is Medical Director for [facility] Pulmonary Rehabilitation.

## 2021-07-07 ENCOUNTER — Other Ambulatory Visit: Payer: Self-pay

## 2021-07-07 DIAGNOSIS — I214 Non-ST elevation (NSTEMI) myocardial infarction: Secondary | ICD-10-CM | POA: Diagnosis not present

## 2021-07-07 NOTE — Progress Notes (Signed)
Daily Session Note  Patient Details  Name: Andrea Phillips MRN: [id number] Date of Birth: [birth year] Referring Provider:   Flowsheet Row Cardiac Rehab from 05/18/2021 in [facility] Cardiac and Pulmonary Rehab  Referring Provider Serafina Royals MD       Encounter Date: 07/07/2021  Check In:  Session Check In - 07/07/21 1414       Check-In   Supervising physician immediately available to respond to emergencies See telemetry face sheet for immediately available ER MD    Location [facility]-Cardiac & Pulmonary Rehab    Staff Present Birdie Sons, MPA, Nino Glow, MS, ASCM CEP, Exercise Physiologist;Joseph Tessie Fass, [place]    Virtual Visit No    Medication changes reported     No    Fall or balance concerns reported    No    Tobacco Cessation No Change    Warm-up and Cool-down Performed on first and last piece of equipment    Resistance Training Performed Yes    VAD Patient? No    PAD/SET Patient? No      Pain Assessment   Currently in Pain? No/denies                Social History   Tobacco Use  Smoking Status Never  Smokeless Tobacco Never    Goals Met:  Independence with exercise equipment Exercise tolerated well No report of concerns or symptoms today Strength training completed today  Goals Unmet:  Not Applicable  Comments: Pt able to follow exercise prescription today without complaint.  Will continue to monitor for progression.    Dr. Emily Filbert is Medical Director for [place].  Dr. Ottie Glazier is Medical Director for [facility] Pulmonary Rehabilitation.

## 2021-07-08 ENCOUNTER — Encounter: Payer: Medicare Other | Admitting: *Deleted

## 2021-07-08 ENCOUNTER — Other Ambulatory Visit: Payer: Self-pay

## 2021-07-08 DIAGNOSIS — I214 Non-ST elevation (NSTEMI) myocardial infarction: Secondary | ICD-10-CM

## 2021-07-08 NOTE — Progress Notes (Signed)
Daily Session Note  Patient Details  Name: Andrea Phillips MRN: [id number] Date of Birth: Mar 21, 1935 Referring Provider:   Flowsheet Row Cardiac Rehab from 05/18/2021 in [facility] Cardiac and Pulmonary Rehab  Referring Provider Serafina Royals MD       Encounter Date: 07/08/2021  Check In:  Session Check In - 07/08/21 1406       Check-In   Supervising physician immediately available to respond to emergencies See telemetry face sheet for immediately available ER MD    Location [facility]-Cardiac & Pulmonary Rehab    Staff Present Renita Papa, RN BSN;Joseph Scanlon, RCP,RRT,BSRT;Jessica [place], [place], RCEP, CCRP, CCET    Virtual Visit No    Medication changes reported     No    Fall or balance concerns reported    No    Warm-up and Cool-down Performed on first and last piece of equipment    Resistance Training Performed Yes    VAD Patient? No    PAD/SET Patient? No      Pain Assessment   Currently in Pain? No/denies                Social History   Tobacco Use  Smoking Status Never  Smokeless Tobacco Never    Goals Met:  Independence with exercise equipment Exercise tolerated well No report of concerns or symptoms today Strength training completed today  Goals Unmet:  Not Applicable  Comments: Pt able to follow exercise prescription today without complaint.  Will continue to monitor for progression.    Dr. Emily Filbert is Medical Director for [place].  Dr. Ottie Glazier is Medical Director for [facility] Pulmonary Rehabilitation.

## 2021-07-09 ENCOUNTER — Other Ambulatory Visit (HOSPITAL_COMMUNITY): Payer: Self-pay | Admitting: Internal Medicine

## 2021-07-09 ENCOUNTER — Other Ambulatory Visit: Payer: Self-pay | Admitting: Internal Medicine

## 2021-07-09 DIAGNOSIS — N950 Postmenopausal bleeding: Secondary | ICD-10-CM

## 2021-07-12 ENCOUNTER — Other Ambulatory Visit: Payer: Self-pay

## 2021-07-12 DIAGNOSIS — I214 Non-ST elevation (NSTEMI) myocardial infarction: Secondary | ICD-10-CM | POA: Diagnosis not present

## 2021-07-12 NOTE — Progress Notes (Signed)
Daily Session Note  Patient Details  Name: TANASHA MENEES MRN: [id number] Date of Birth: 06/16/1935 Referring Provider:   Flowsheet Row Cardiac Rehab from 05/18/2021 in [facility] Cardiac and Pulmonary Rehab  Referring Provider Serafina Royals MD       Encounter Date: 07/12/2021  Check In:  Session Check In - 07/12/21 1420       Check-In   Supervising physician immediately available to respond to emergencies See telemetry face sheet for immediately available ER MD    Location [facility]-Cardiac & Pulmonary Rehab    Staff Present Birdie Sons, MPA, Nino Glow, MS, ASCM CEP, Exercise Physiologist;Jessica [place], MA, RCEP, CCRP, CCET;Joseph [place], [place]    Virtual Visit No    Medication changes reported     No    Fall or balance concerns reported    No    Tobacco Cessation No Change    Warm-up and Cool-down Performed on first and last piece of equipment    Resistance Training Performed Yes    VAD Patient? No    PAD/SET Patient? No      Pain Assessment   Currently in Pain? No/denies                Social History   Tobacco Use  Smoking Status Never  Smokeless Tobacco Never    Goals Met:  Independence with exercise equipment Exercise tolerated well No report of concerns or symptoms today Strength training completed today  Goals Unmet:  Not Applicable  Comments: Pt able to follow exercise prescription today without complaint.  Will continue to monitor for progression.    Dr. Emily Filbert is Medical Director for [place].  Dr. Ottie Glazier is Medical Director for [facility] Pulmonary Rehabilitation.

## 2021-07-14 ENCOUNTER — Other Ambulatory Visit: Payer: Self-pay

## 2021-07-14 DIAGNOSIS — I214 Non-ST elevation (NSTEMI) myocardial infarction: Secondary | ICD-10-CM | POA: Diagnosis not present

## 2021-07-14 NOTE — Progress Notes (Signed)
Daily Session Note  Patient Details  Name: Andrea Phillips MRN: [id number] Date of Birth: May 31, 1935 Referring Provider:   Flowsheet Row Cardiac Rehab from 05/18/2021 in [facility] Cardiac and Pulmonary Rehab  Referring Provider Serafina Royals MD       Encounter Date: 07/14/2021  Check In:  Session Check In - 07/14/21 1412       Check-In   Supervising physician immediately available to respond to emergencies See telemetry face sheet for immediately available ER MD    Location [facility]-Cardiac & Pulmonary Rehab    Staff Present Birdie Sons, MPA, Nino Glow, MS, ASCM CEP, Exercise Physiologist;Joseph Tessie Fass, [place]    Virtual Visit No    Medication changes reported     No    Fall or balance concerns reported    No    Tobacco Cessation No Change    Warm-up and Cool-down Performed on first and last piece of equipment    Resistance Training Performed Yes    VAD Patient? No    PAD/SET Patient? No      Pain Assessment   Currently in Pain? No/denies                Social History   Tobacco Use  Smoking Status Never  Smokeless Tobacco Never    Goals Met:  Independence with exercise equipment Exercise tolerated well No report of concerns or symptoms today Strength training completed today  Goals Unmet:  Not Applicable  Comments: Pt able to follow exercise prescription today without complaint.  Will continue to monitor for progression.    Dr. Emily Filbert is Medical Director for [place].  Dr. Ottie Glazier is Medical Director for [facility] Pulmonary Rehabilitation.

## 2021-07-15 ENCOUNTER — Ambulatory Visit
Admission: RE | Admit: 2021-07-15 | Discharge: 2021-07-15 | Disposition: A | Discharge status: Home / Self Care | Payer: Medicare Other | Source: Ambulatory Visit | Attending: Internal Medicine | Admitting: Internal Medicine

## 2021-07-15 ENCOUNTER — Other Ambulatory Visit: Payer: Self-pay

## 2021-07-15 ENCOUNTER — Encounter: Payer: Medicare Other | Admitting: *Deleted

## 2021-07-15 DIAGNOSIS — I214 Non-ST elevation (NSTEMI) myocardial infarction: Secondary | ICD-10-CM | POA: Diagnosis not present

## 2021-07-15 DIAGNOSIS — Z1231 Encounter for screening mammogram for malignant neoplasm of breast: Secondary | ICD-10-CM | POA: Insufficient documentation

## 2021-07-15 NOTE — Progress Notes (Signed)
Daily Session Note  Patient Details  Name: KEMARIA DEDIC MRN: [id number] Date of Birth: 09/26/1935 Referring Provider:   Flowsheet Row Cardiac Rehab from 05/18/2021 in [facility] Cardiac and Pulmonary Rehab  Referring Provider Serafina Royals MD       Encounter Date: 07/15/2021  Check In:  Session Check In - 07/15/21 1359       Check-In   Supervising physician immediately available to respond to emergencies See telemetry face sheet for immediately available ER MD    Location [facility]-Cardiac & Pulmonary Rehab    Staff Present Renita Papa, RN BSN;Jessica Luan Pulling, MA, RCEP, CCRP, CCET;Melissa Grawn, [place], LDN    Virtual Visit No    Medication changes reported     No    Fall or balance concerns reported    No    Warm-up and Cool-down Performed on first and last piece of equipment    Resistance Training Performed Yes    VAD Patient? No    PAD/SET Patient? No      Pain Assessment   Currently in Pain? No/denies                Social History   Tobacco Use  Smoking Status Never  Smokeless Tobacco Never    Goals Met:  Independence with exercise equipment Exercise tolerated well No report of concerns or symptoms today Strength training completed today  Goals Unmet:  Not Applicable  Comments: Pt able to follow exercise prescription today without complaint.  Will continue to monitor for progression.    Dr. Emily Filbert is Medical Director for [place].  Dr. Ottie Glazier is Medical Director for [facility] Pulmonary Rehabilitation.

## 2021-07-19 ENCOUNTER — Other Ambulatory Visit: Payer: Self-pay

## 2021-07-19 DIAGNOSIS — I214 Non-ST elevation (NSTEMI) myocardial infarction: Secondary | ICD-10-CM | POA: Diagnosis not present

## 2021-07-19 NOTE — Progress Notes (Signed)
Daily Session Note  Patient Details  Name: DAWNIELLE CHRISTIANA MRN: [id number] Date of Birth: 13-Jul-1935 Referring Provider:   Flowsheet Row Cardiac Rehab from 05/18/2021 in [facility] Cardiac and Pulmonary Rehab  Referring Provider Serafina Royals MD       Encounter Date: 07/19/2021  Check In:  Session Check In - 07/19/21 1403       Check-In   Supervising physician immediately available to respond to emergencies See telemetry face sheet for immediately available ER MD    Location [facility]-Cardiac & Pulmonary Rehab    Staff Present Birdie Sons, MPA, Nino Glow, MS, ASCM CEP, Exercise Physiologist;Amanda Oletta Darter, BA, ACSM CEP, Exercise Physiologist    Virtual Visit No    Medication changes reported     No    Fall or balance concerns reported    No    Tobacco Cessation No Change    Warm-up and Cool-down Performed on first and last piece of equipment    Resistance Training Performed Yes    VAD Patient? No    PAD/SET Patient? No      Pain Assessment   Currently in Pain? No/denies                Social History   Tobacco Use  Smoking Status Never  Smokeless Tobacco Never    Goals Met:  Independence with exercise equipment Exercise tolerated well No report of concerns or symptoms today Strength training completed today  Goals Unmet:  Not Applicable  Comments: Pt able to follow exercise prescription today without complaint.  Will continue to monitor for progression.    Dr. Emily Filbert is Medical Director for [place].  Dr. Ottie Glazier is Medical Director for [facility] Pulmonary Rehabilitation.

## 2021-07-21 ENCOUNTER — Other Ambulatory Visit: Payer: Self-pay

## 2021-07-21 DIAGNOSIS — I214 Non-ST elevation (NSTEMI) myocardial infarction: Secondary | ICD-10-CM | POA: Diagnosis not present

## 2021-07-21 NOTE — Progress Notes (Signed)
Daily Session Note  Patient Details  Name: Andrea Phillips MRN: [id number] Date of Birth: 08-16-1935 Referring Provider:   Flowsheet Row Cardiac Rehab from 05/18/2021 in [facility] Cardiac and Pulmonary Rehab  Referring Provider Serafina Royals MD       Encounter Date: 07/21/2021  Check In:  Session Check In - 07/21/21 1417       Check-In   Supervising physician immediately available to respond to emergencies See telemetry face sheet for immediately available ER MD    Location [facility]-Cardiac & Pulmonary Rehab    Staff Present Birdie Sons, MPA, Nino Glow, MS, ASCM CEP, Exercise Physiologist;Joseph Tessie Fass, [place]    Virtual Visit No    Medication changes reported     No    Fall or balance concerns reported    No    Tobacco Cessation No Change    Warm-up and Cool-down Performed on first and last piece of equipment    Resistance Training Performed Yes    VAD Patient? No    PAD/SET Patient? No      Pain Assessment   Currently in Pain? No/denies                Social History   Tobacco Use  Smoking Status Never  Smokeless Tobacco Never    Goals Met:  Independence with exercise equipment Exercise tolerated well Personal goals reviewed No report of concerns or symptoms today Strength training completed today  Goals Unmet:  Not Applicable  Comments: Pt able to follow exercise prescription today without complaint.  Will continue to monitor for progression.    Dr. Emily Filbert is Medical Director for [place].  Dr. Ottie Glazier is Medical Director for [facility] Pulmonary Rehabilitation.

## 2021-07-21 NOTE — Progress Notes (Signed)
Contacted Dr. Gwen Pounds to notify him that the patient's BP was elevated today at cardiac rehab during her pre-discharge 6 minute walk test, although the patient was asymptomatic. Dr was notified that patient's BP at rest has been trending up over the past few weeks and patient states she is taking her BP medication as prescribed. Dr. Laurena Bering replied that he will have his office follow up with the patient. Patient was advised to follow up as well and should she become symptomatic to seek medical attention. Patient stated understanding.

## 2021-07-22 ENCOUNTER — Encounter: Payer: Medicare Other | Admitting: *Deleted

## 2021-07-22 ENCOUNTER — Other Ambulatory Visit: Payer: Self-pay

## 2021-07-22 DIAGNOSIS — I214 Non-ST elevation (NSTEMI) myocardial infarction: Secondary | ICD-10-CM | POA: Diagnosis not present

## 2021-07-22 NOTE — Progress Notes (Signed)
Daily Session Note  Patient Details  Name: Andrea Phillips MRN: [id number] Date of Birth: 1934/11/04 Referring Provider:   Flowsheet Row Cardiac Rehab from 05/18/2021 in [facility] Cardiac and Pulmonary Rehab  Referring Provider Serafina Royals MD       Encounter Date: 07/22/2021  Check In:  Session Check In - 07/22/21 1358       Check-In   Supervising physician immediately available to respond to emergencies See telemetry face sheet for immediately available ER MD    Location [facility]-Cardiac & Pulmonary Rehab    Staff Present Renita Papa, RN BSN;Jessica [place], MA, RCEP, CCRP, CCET;Joseph [place], [place]    Virtual Visit No    Medication changes reported     Yes    Comments increased telmisartan to 80 mg.    Fall or balance concerns reported    No    Tobacco Cessation No Change    Warm-up and Cool-down Performed on first and last piece of equipment    Resistance Training Performed Yes    VAD Patient? No    PAD/SET Patient? No      Pain Assessment   Currently in Pain? No/denies                Social History   Tobacco Use  Smoking Status Never  Smokeless Tobacco Never    Goals Met:  Independence with exercise equipment Exercise tolerated well No report of concerns or symptoms today Strength training completed today  Goals Unmet:  Not Applicable  Comments: Pt able to follow exercise prescription today without complaint.  Will continue to monitor for progression.    Dr. Emily Filbert is Medical Director for [place].  Dr. Ottie Glazier is Medical Director for [facility] Pulmonary Rehabilitation.

## 2021-07-26 ENCOUNTER — Other Ambulatory Visit: Payer: Self-pay

## 2021-07-26 ENCOUNTER — Encounter: Payer: Medicare Other | Attending: Internal Medicine

## 2021-07-26 DIAGNOSIS — I214 Non-ST elevation (NSTEMI) myocardial infarction: Secondary | ICD-10-CM | POA: Insufficient documentation

## 2021-07-26 NOTE — Progress Notes (Signed)
Incomplete Daily Session Note  Patient Details  Name: Andrea Phillips MRN: [id number] Date of Birth: 1935-02-05 Referring Provider:   Flowsheet Row Cardiac Rehab from 05/18/2021 in [facility] Cardiac and Pulmonary Rehab  Referring Provider Serafina Royals MD       Encounter Date: 07/26/2021  Check In:  Session Check In - 07/26/21 1410       Check-In   Supervising physician immediately available to respond to emergencies See telemetry face sheet for immediately available ER MD    Location [facility]-Cardiac & Pulmonary Rehab    Staff Present Birdie Sons, MPA, Nino Glow, MS, ASCM CEP, Exercise Physiologist;Joseph Tessie Fass, [place]    Virtual Visit No    Medication changes reported     No    Fall or balance concerns reported    No    Tobacco Cessation No Change    Warm-up and Cool-down Performed on first and last piece of equipment    Resistance Training Performed Yes    VAD Patient? No    PAD/SET Patient? No      Pain Assessment   Currently in Pain? No/denies                Social History   Tobacco Use  Smoking Status Never  Smokeless Tobacco Never    Goals Met:  Patien unable to complete exercise today due to elevated BP. However, patient remained asymptomatic.   Goals Unmet:  Not Applicable  Comments: Patient's BP is elevated at arrival/at rest. Patient is asymptomatic and instructed to exercise on the seated machine with a light load today. BP continued to be elevated and she was stopped from exercising. Patient was educated about taking medication as ordered and patient stated understanding. Jettie Booze, PA, in cardiology office contacted. PA replied that patient should take 18m telmisartan once daily and not split dose, and that patient has a return appointment scheduled for November. Patient completed paperwork today to prepare for discharge at next visit. Patient continues to be asymptomatic and left to go home.     Dr. MEmily Filbertis Medical  Director for [place]  Dr. FOttie Glazieris [facility].

## 2021-07-28 ENCOUNTER — Emergency Department: Payer: Medicare Other

## 2021-07-28 ENCOUNTER — Encounter: Payer: Self-pay | Admitting: Emergency Medicine

## 2021-07-28 ENCOUNTER — Emergency Department
Admission: EM | Admit: 2021-07-28 | Discharge: 2021-07-28 | Disposition: A | Discharge status: Home / Self Care | Payer: Medicare Other | Attending: Emergency Medicine | Admitting: Emergency Medicine

## 2021-07-28 ENCOUNTER — Encounter: Payer: Self-pay | Admitting: *Deleted

## 2021-07-28 ENCOUNTER — Other Ambulatory Visit: Payer: Self-pay

## 2021-07-28 DIAGNOSIS — Z96652 Presence of left artificial knee joint: Secondary | ICD-10-CM | POA: Diagnosis not present

## 2021-07-28 DIAGNOSIS — E039 Hypothyroidism, unspecified: Secondary | ICD-10-CM | POA: Diagnosis not present

## 2021-07-28 DIAGNOSIS — I5023 Acute on chronic systolic (congestive) heart failure: Secondary | ICD-10-CM | POA: Insufficient documentation

## 2021-07-28 DIAGNOSIS — M25562 Pain in left knee: Secondary | ICD-10-CM | POA: Insufficient documentation

## 2021-07-28 DIAGNOSIS — I214 Non-ST elevation (NSTEMI) myocardial infarction: Secondary | ICD-10-CM

## 2021-07-28 DIAGNOSIS — I110 Hypertensive heart disease with heart failure: Secondary | ICD-10-CM | POA: Diagnosis not present

## 2021-07-28 DIAGNOSIS — W19XXXA Unspecified fall, initial encounter: Secondary | ICD-10-CM

## 2021-07-28 DIAGNOSIS — M25561 Pain in right knee: Secondary | ICD-10-CM | POA: Insufficient documentation

## 2021-07-28 DIAGNOSIS — J45909 Unspecified asthma, uncomplicated: Secondary | ICD-10-CM | POA: Insufficient documentation

## 2021-07-28 DIAGNOSIS — Z79899 Other long term (current) drug therapy: Secondary | ICD-10-CM | POA: Diagnosis not present

## 2021-07-28 DIAGNOSIS — S59911A Unspecified injury of right forearm, initial encounter: Secondary | ICD-10-CM | POA: Diagnosis present

## 2021-07-28 DIAGNOSIS — Z7951 Long term (current) use of inhaled steroids: Secondary | ICD-10-CM | POA: Diagnosis not present

## 2021-07-28 DIAGNOSIS — W101XXA Fall (on)(from) sidewalk curb, initial encounter: Secondary | ICD-10-CM | POA: Diagnosis not present

## 2021-07-28 DIAGNOSIS — S51811A Laceration without foreign body of right forearm, initial encounter: Secondary | ICD-10-CM | POA: Insufficient documentation

## 2021-07-28 DIAGNOSIS — Z955 Presence of coronary angioplasty implant and graft: Secondary | ICD-10-CM | POA: Diagnosis not present

## 2021-07-28 DIAGNOSIS — S0990XA Unspecified injury of head, initial encounter: Secondary | ICD-10-CM | POA: Insufficient documentation

## 2021-07-28 MED ORDER — CLONIDINE HCL 0.1 MG PO TABS
0.1000 mg | ORAL_TABLET | Freq: Once | ORAL | Status: AC
  Administered 2021-07-28: 0.1 mg via ORAL
  Filled 2021-07-28: qty 1

## 2021-07-28 NOTE — ED Triage Notes (Addendum)
See first RN note. Pt had mechanical fall where she fell forward on the sidewalk.  Pt states her face and eye hit the corner of a cinder block.  Pt denies any LOC or blood thinners, but presents with hematoma and bruising on the face.  Pt states she has mild neck and head soreness.  Pt also c/o bilateral knee pain.

## 2021-07-28 NOTE — ED Notes (Signed)
Pt and family verbalized understanding of dc instruction. Denies any questions or concerns at this time. Wheeled family to entrance of the ED and family put pt in car to transport home.

## 2021-07-28 NOTE — Progress Notes (Signed)
Cardiac Individual Treatment Plan  Patient Details  Name: Andrea Phillips MRN: [id number] Date of Birth: 15-May-1935 Referring Provider:   Flowsheet Row Cardiac Rehab from 05/18/2021 in [facility] Cardiac and Pulmonary Rehab  Referring Provider Serafina Royals MD       Initial Encounter Date:  Flowsheet Row Cardiac Rehab from 05/18/2021 in [facility] Cardiac and Pulmonary Rehab  Date 05/18/21       Visit Diagnosis: NSTEMI (non-ST elevated myocardial infarction) [facility])  Patient's Home Medications on Admission:  Current Outpatient Medications:    acetaminophen (TYLENOL) 325 MG tablet, Take 325-650 mg by mouth every 6 (six) hours as needed for moderate pain., Disp: , Rfl:    albuterol (VENTOLIN HFA) 108 (90 Base) MCG/ACT inhaler, Inhale 2 puffs into the lungs every 6 (six) hours as needed for wheezing or shortness of breath., Disp: , Rfl:    Cholecalciferol (VITAMIN D3) 1.25 MG (50000 UT) TABS, Take 1,000 Units by mouth once a week., Disp: , Rfl:    fluocinonide (LIDEX) 0.05 % external solution, Apply 1 application topically daily as needed (skin irritations)., Disp: , Rfl:    fluticasone (FLONASE) 50 MCG/ACT nasal spray, Place 1 spray into both nostrils 2 (two) times daily as needed for allergies or rhinitis., Disp: , Rfl:    furosemide (LASIX) 40 MG tablet, Take 1 tablet (40 mg total) by mouth daily. (Patient taking differently: Take 20 mg by mouth daily.), Disp: 90 tablet, Rfl: 3   guaiFENesin (MUCINEX) 600 MG 12 hr tablet, Take 1 tablet (600 mg total) by mouth 2 (two) times daily., Disp: , Rfl:    HYDROcodone-acetaminophen (NORCO/VICODIN) 5-325 MG tablet, Take 1-2 tablets by mouth every 6 (six) hours as needed for severe pain. (Patient not taking: Reported on 06/09/2021), Disp: 14 tablet, Rfl: 0   icosapent Ethyl (VASCEPA) 1 g capsule, Take 2 g by mouth 2 (two) times daily with a meal., Disp: , Rfl:    levothyroxine (SYNTHROID) 112 MCG tablet, Take 112 mcg by mouth daily., Disp: , Rfl:     Magnesium Oxide 250 MG TABS, Take 1-2 tablets by mouth daily as needed., Disp: , Rfl:    metoprolol succinate (TOPROL-XL) 50 MG 24 hr tablet, Take 1 tablet (50 mg total) by mouth daily. Take with or immediately following a meal. (Patient taking differently: Take 25 mg by mouth daily. Take with or immediately following a meal.), Disp: 90 tablet, Rfl: 1   mometasone-formoterol (DULERA) 200-5 MCG/ACT AERO, Inhale 2 puffs into the lungs 2 (two) times daily., Disp: 1 each, Rfl: 1   omeprazole (PRILOSEC OTC) 20 MG tablet, Take 2 tablets (40 mg total) by mouth daily., Disp: 5 tablet, Rfl: 0   telmisartan (MICARDIS) 40 MG tablet, Take 40 mg by mouth daily., Disp: , Rfl:   Past Medical History: Past Medical History:  Diagnosis Date   Arthritis    ra   Asthma    CHF (congestive heart failure) (HCC)    Dysrhythmia    Edema extremities    GERD (gastroesophageal reflux disease)    Gout    Headache    History of hiatal hernia    Hypertension    Hypothyroidism    Shortness of breath dyspnea    Sleep apnea     Tobacco Use: Social History   Tobacco Use  Smoking Status Never  Smokeless Tobacco Never    Labs: Recent Review Flowsheet Data     Labs for ITP Cardiac and Pulmonary Rehab Latest Ref Rng & Units 04/02/2021  Cholestrol 0 - 200 mg/dL 170   LDLCALC 0 - 99 mg/dL 112(H)   HDL >40 mg/dL 46   Trlycerides <150 mg/dL 60        Exercise Target Goals: Exercise Program Goal: Individual exercise prescription set using results from initial 6 min walk test and THRR while considering  patient's activity barriers and safety.   Exercise Prescription Goal: Initial exercise prescription builds to 30-45 minutes a day of aerobic activity, 2-3 days per week.  Home exercise guidelines will be given to patient during program as part of exercise prescription that the participant will acknowledge.   Education: Aerobic Exercise: - Group verbal and visual presentation on the components of exercise  prescription. Introduces F.I.T.T principle from ACSM for exercise prescriptions.  Reviews F.I.T.T. principles of aerobic exercise including progression. Written material given at graduation. Flowsheet Row Cardiac Rehab from 07/21/2021 in [facility] Cardiac and Pulmonary Rehab  Date 06/16/21  Educator AS  Instruction Review Code 1- Verbalizes Understanding       Education: Resistance Exercise: - Group verbal and visual presentation on the components of exercise prescription. Introduces F.I.T.T principle from ACSM for exercise prescriptions  Reviews F.I.T.T. principles of resistance exercise including progression. Written material given at graduation. Flowsheet Row Cardiac Rehab from 07/21/2021 in [facility] Cardiac and Pulmonary Rehab  Date 06/23/21  Educator AS  Instruction Review Code 1- Verbalizes Understanding        Education: Exercise & Equipment Safety: - Individual verbal instruction and demonstration of equipment use and safety with use of the equipment. Flowsheet Row Cardiac Rehab from 07/21/2021 in [facility] Cardiac and Pulmonary Rehab  Education need identified 05/18/21  Date 05/18/21  Educator [place]  Instruction Review Code 1- Verbalizes Understanding       Education: Exercise Physiology & General Exercise Guidelines: - Group verbal and written instruction with models to review the exercise physiology of the cardiovascular system and associated critical values. Provides general exercise guidelines with specific guidelines to those with heart or lung disease.  Flowsheet Row Cardiac Rehab from 07/21/2021 in [facility] Cardiac and Pulmonary Rehab  Date 06/09/21  Educator AS  Instruction Review Code 1- Verbalizes Understanding       Education: Flexibility, Balance, Mind/Body Relaxation: - Group verbal and visual presentation with interactive activity on the components of exercise prescription. Introduces F.I.T.T principle from ACSM for exercise prescriptions. Reviews F.I.T.T. principles of  flexibility and balance exercise training including progression. Also discusses the mind body connection.  Reviews various relaxation techniques to help reduce and manage stress (i.e. Deep breathing, progressive muscle relaxation, and visualization). Balance handout provided to take home. Written material given at graduation. Flowsheet Row Cardiac Rehab from 07/21/2021 in [facility] Cardiac and Pulmonary Rehab  Date 06/30/21  Educator AS  Instruction Review Code 1- Verbalizes Understanding       Activity Barriers & Risk Stratification:  Activity Barriers & Cardiac Risk Stratification - 05/18/21 1152       Activity Barriers & Cardiac Risk Stratification   Activity Barriers Left Knee Replacement;Deconditioning;Balance Concerns;Muscular Weakness    Cardiac Risk Stratification Moderate             6 Minute Walk:  6 Minute Walk     Row Name 05/18/21 1155 07/21/21 1428       6 Minute Walk   Phase Initial --    Distance 1000 feet 1260 feet    Distance % Change -- 26 %    Distance Feet Change -- 260 ft    Walk Time 6 minutes  6 minutes    # of Rest Breaks 0 0    MPH 1.89 2.38    METS 1.73 2.83    RPE 11 13    Perceived Dyspnea  1 1    VO2 Peak 6.06 9.9    Symptoms Yes (comment) No    Comments Fatigued, slight dizziness- resolved with rest --    Resting HR 62 bpm 74 bpm    Resting BP 140/74 156/76    Resting Oxygen Saturation  96 % 98 %    Exercise Oxygen Saturation  during 6 min walk 96 % 90 %    Max Ex. HR 99 bpm 121 bpm    Max Ex. BP 182/70  RCK 150/72 220/76    2 Minute Post BP 140/72 184/84             Oxygen Initial Assessment:   Oxygen Re-Evaluation:   Oxygen Discharge (Final Oxygen Re-Evaluation):   Initial Exercise Prescription:  Initial Exercise Prescription - 05/18/21 1500       Date of Initial Exercise RX and Referring Provider   Date 05/18/21    Referring Provider Serafina Royals MD      NuStep   Level 1    SPM 80    Minutes 15    METs 1.7       Arm Ergometer   Level 1    RPM 30    Minutes 15    METs 1.7      Biostep-RELP   Level 1    SPM 50    Minutes 15    METs 1.7      Track   Laps 16    Minutes 15    METs 1.87      Prescription Details   Frequency (times per week) 3    Duration Progress to 30 minutes of continuous aerobic without signs/symptoms of physical distress      Intensity   THRR 40-80% of Max Heartrate 91-121    Ratings of Perceived Exertion 11-13    Perceived Dyspnea 0-4      Progression   Progression Continue to progress workloads to maintain intensity without signs/symptoms of physical distress.      Resistance Training   Training Prescription Yes    Weight 3 lb    Reps 10-15             Perform Capillary Blood Glucose checks as needed.  Exercise Prescription Changes:   Exercise Prescription Changes     Row Name 05/18/21 1500 05/31/21 1300 06/03/21 1400 06/14/21 1000 06/29/21 1300     Response to Exercise   Blood Pressure (Admit) 140/74 122/62 -- 138/60 148/68   Blood Pressure (Exercise) 182/70 162/70 -- 162/68 --   Blood Pressure (Exit) 140/72 122/64 -- 148/60 142/64   Heart Rate (Admit) 62 bpm 81 bpm -- 70 bpm 78 bpm   Heart Rate (Exercise) 99 bpm 113 bpm -- 101 bpm 119 bpm   Heart Rate (Exit) 66 bpm 79 bpm -- 65 bpm 87 bpm   Oxygen Saturation (Admit) 96 % -- -- -- --   Oxygen Saturation (Exercise) 96 % -- -- -- --   Oxygen Saturation (Exit) 96 % -- -- -- --   Rating of Perceived Exertion (Exercise) 11 14 -- 15 15   Perceived Dyspnea (Exercise) 1 -- -- -- --   Symptoms fatigue, slight dizziness -- -- none none   Comments walk test results third day exercise -- -- --   Duration --  Progress to 30 minutes of  aerobic without signs/symptoms of physical distress -- Progress to 30 minutes of  aerobic without signs/symptoms of physical distress Progress to 30 minutes of  aerobic without signs/symptoms of physical distress   Intensity -- THRR unchanged -- THRR unchanged THRR  unchanged     Progression   Progression -- Continue to progress workloads to maintain intensity without signs/symptoms of physical distress. -- Continue to progress workloads to maintain intensity without signs/symptoms of physical distress. Continue to progress workloads to maintain intensity without signs/symptoms of physical distress.   Average METs -- 2.2 -- 2.51 2.3     Resistance Training   Training Prescription -- Yes -- Yes Yes   Weight -- 3 lb -- 3 lb 3 lb   Reps -- 10-15 -- 10-15 10-15     NuStep   Level -- 1 -- -- --   SPM -- 80 -- -- --   Minutes -- 15 -- -- --   METs -- 2 -- -- --     Arm Ergometer   Level -- -- -- 1 --   Minutes -- -- -- 15 --   METs -- -- -- 2 --     Biostep-RELP   Level -- -- -- 2 2   Minutes -- -- -- 15 15   METs -- -- -- 3 2     Track   Laps -- 26 -- 30 30   Minutes -- 15 -- 15 15   METs -- 2.4 -- 2.63 2.63     Home Exercise Plan   Plans to continue exercise at -- -- Home (comment)  walking, staff videos Home (comment)  walking, staff videos Home (comment)  walking, staff videos   Frequency -- -- Add 2 additional days to program exercise sessions. Add 2 additional days to program exercise sessions. Add 2 additional days to program exercise sessions.   Initial Home Exercises Provided -- -- 06/03/21 06/03/21 06/03/21    Row Name 07/12/21 1300 07/26/21 0800           Response to Exercise   Blood Pressure (Admit) 162/74 132/70      Blood Pressure (Exit) 108/64 126/64      Heart Rate (Admit) 73 bpm 69 bpm      Heart Rate (Exercise) 115 bpm 111 bpm      Heart Rate (Exit) 70 bpm 79 bpm      Rating of Perceived Exertion (Exercise) 13 12      Symptoms none none      Duration Continue with 30 min of aerobic exercise without signs/symptoms of physical distress. Continue with 30 min of aerobic exercise without signs/symptoms of physical distress.      Intensity THRR unchanged THRR unchanged             Progression   Progression Continue to  progress workloads to maintain intensity without signs/symptoms of physical distress. Continue to progress workloads to maintain intensity without signs/symptoms of physical distress.      Average METs 2.5 2             Resistance Training   Training Prescription Yes Yes      Weight 3 lb 3 lb      Reps 10-15 10-15             Interval Training   Interval Training No No             NuStep   Level 3 3  Minutes 15 15      METs 2.4 1.9             Arm Ergometer   Level 1 --      Minutes 15 --      METs 2.1 --             Track   Laps 37 30      Minutes 15 1      METs 3.01 2.63             Home Exercise Plan   Plans to continue exercise at Home (comment)  walking, staff videos Home (comment)  walking, staff videos      Frequency Add 2 additional days to program exercise sessions. Add 2 additional days to program exercise sessions.      Initial Home Exercises Provided 06/03/21 06/03/21               Exercise Comments:   Exercise Comments     Row Name 05/24/21 1414 07/26/21 1429 07/28/21 1442       Exercise Comments First full day of exercise!  Patient was oriented to gym and equipment including functions, settings, policies, and procedures.  Patient's individual exercise prescription and treatment plan were reviewed.  All starting workloads were established based on the results of the 6 minute walk test done at initial orientation visit.  The plan for exercise progression was also introduced and progression will be customized based on patient's performance and goals. Patient's BP is elevated at arrival/at rest. Patient is asymptomatic and instructed to exercise on the seated machine with a light load today. BP continued to be elevated and she was stopped from exercising. Patient was educated about taking medication as ordered and patient stated understanding. Jettie Booze, PA, in cardiology office contacted. PA replied that patient should take 63m telmisartan once  daily and not split dose, and that patient has a return appointment scheduled for November. Patient completed paperwork today to prepare for discharge at next visit. Patient continues to be asymptomatic and left to go home. Adalae graduated today from  rehab with 33 sessions completed.  Details of the patient's exercise prescription and what She needs to do in order to continue the prescription and progress were discussed with patient.  Patient was given a copy of prescription and goals.  Patient verbalized understanding.  Giulliana plans to continue to exercise by walking and doing exercises learned while participating in rehab.              Exercise Goals and Review:   Exercise Goals     Row Name 05/18/21 1523             Exercise Goals   Increase Physical Activity Yes       Intervention Provide advice, education, support and counseling about physical activity/exercise needs.;Develop an individualized exercise prescription for aerobic and resistive training based on initial evaluation findings, risk stratification, comorbidities and participant's personal goals.       Expected Outcomes Short Term: Attend rehab on a regular basis to increase amount of physical activity.;Long Term: Add in home exercise to make exercise part of routine and to increase amount of physical activity.;Long Term: Exercising regularly at least 3-5 days a week.       Increase Strength and Stamina Yes       Intervention Provide advice, education, support and counseling about physical activity/exercise needs.;Develop an individualized exercise prescription for aerobic and resistive training based on initial evaluation findings,  risk stratification, comorbidities and participant's personal goals.       Expected Outcomes Short Term: Increase workloads from initial exercise prescription for resistance, speed, and METs.;Short Term: Perform resistance training exercises routinely during rehab and add in resistance training at  home;Long Term: Improve cardiorespiratory fitness, muscular endurance and strength as measured by increased METs and functional capacity (6MWT)       Able to understand and use rate of perceived exertion (RPE) scale Yes       Intervention Provide education and explanation on how to use RPE scale       Expected Outcomes Short Term: Able to use RPE daily in rehab to express subjective intensity level;Long Term:  Able to use RPE to guide intensity level when exercising independently       Able to understand and use Dyspnea scale Yes       Intervention Provide education and explanation on how to use Dyspnea scale       Expected Outcomes Short Term: Able to use Dyspnea scale daily in rehab to express subjective sense of shortness of breath during exertion;Long Term: Able to use Dyspnea scale to guide intensity level when exercising independently       Knowledge and understanding of Target Heart Rate Range (THRR) Yes       Intervention Provide education and explanation of THRR including how the numbers were predicted and where they are located for reference       Expected Outcomes Short Term: Able to state/look up THRR;Short Term: Able to use daily as guideline for intensity in rehab;Long Term: Able to use THRR to govern intensity when exercising independently       Able to check pulse independently Yes       Intervention Review the importance of being able to check your own pulse for safety during independent exercise;Provide education and demonstration on how to check pulse in carotid and radial arteries.       Expected Outcomes Short Term: Able to explain why pulse checking is important during independent exercise;Long Term: Able to check pulse independently and accurately       Understanding of Exercise Prescription Yes       Intervention Provide education, explanation, and written materials on patient's individual exercise prescription       Expected Outcomes Short Term: Able to explain program  exercise prescription;Long Term: Able to explain home exercise prescription to exercise independently                Exercise Goals Re-Evaluation :  Exercise Goals Re-Evaluation     Row Name 05/24/21 1414 05/31/21 1307 06/03/21 1439 06/14/21 1031 06/17/21 1405     Exercise Goal Re-Evaluation   Exercise Goals Review Increase Physical Activity;Able to understand and use rate of perceived exertion (RPE) scale;Knowledge and understanding of Target Heart Rate Range (THRR);Understanding of Exercise Prescription;Increase Strength and Stamina;Able to understand and use Dyspnea scale;Able to check pulse independently Increase Physical Activity;Increase Strength and Stamina Increase Physical Activity;Increase Strength and Stamina;Able to understand and use rate of perceived exertion (RPE) scale;Able to understand and use Dyspnea scale;Knowledge and understanding of Target Heart Rate Range (THRR);Able to check pulse independently;Understanding of Exercise Prescription Increase Physical Activity;Increase Strength and Stamina Increase Physical Activity;Increase Strength and Stamina;Understanding of Exercise Prescription   Comments Reviewed RPE and dyspnea scales, THR and program prescription with pt today.  Pt voiced understanding and was given a copy of goals to take home. Aniayah has tolerated exercise well iin her first sessions.  She works in [employer] range.Staff will monitor progress. Reviewed home exercise with pt today.  Pt plans to walk at home and use staff videos for exercise.  Reviewed THR, pulse, RPE, sign and symptoms, pulse oximetery and when to call 911 or MD.  Also discussed weather considerations and indoor options.  Pt voiced understanding. Tiyanna continues to do well in rehab.  She is tolerating the arm crank well and has increased her number of laps on the track to 30. Will continue to monitor. Camarie is doing well in rehab. She is doing some exercise on her off days by walking. She is also doing  stretches and PT exercises each day. She is feeling stronger and better overall.   Expected Outcomes Short: Use RPE daily to regulate intensity. Long: Follow program prescription in THR. Short: attend consistently Long: improve overall stamina Short: Start to add in walking at home Long: Continue to improve stamina. Short: Continue to increase laps on the track Long: Continue to increase overall MET level Short: Continue to add in exercise at home Long: COnitnue to improve stamina.    [place] Name 06/29/21 1347 07/12/21 1323 07/21/21 1428 07/26/21 0832       Exercise Goal Re-Evaluation   Exercise Goals Review Increase Physical Activity;Increase Strength and Stamina Increase Physical Activity;Increase Strength and Stamina;Understanding of Exercise Prescription Increase Physical Activity;Increase Strength and Stamina;Understanding of Exercise Prescription Increase Physical Activity;Increase Strength and Stamina    Comments Riven attends consistently and reaches THR range.  She has increased to 4 lb for strength work.  Staff will monitor progress. Prapti is doing well in rehab.  She is now up to 37 laps on the track and level 3 on the NuStep.  We will continue to monitor her progress. Lesa is doing well in rehab. She does some resistance training at home with the help of a counter. she is not able to push herself due to her increased BP. Royale improved post 6MWT by 26% and 260 feet!  We will continue to monitor progress.    Expected Outcomes Short:  continue to attend consistently Long:  build overall stamina Short: Continue to try to increase laps on track Long: Continue to improve stamina Short: Continue to try to increase laps on track Long: Continue to improve stamina Short: complete LW Long:maintain exercise on her own             Discharge Exercise Prescription (Final Exercise Prescription Changes):  Exercise Prescription Changes - 07/26/21 0800       Response to Exercise   Blood  Pressure (Admit) 132/70    Blood Pressure (Exit) 126/64    Heart Rate (Admit) 69 bpm    Heart Rate (Exercise) 111 bpm    Heart Rate (Exit) 79 bpm    Rating of Perceived Exertion (Exercise) 12    Symptoms none    Duration Continue with 30 min of aerobic exercise without signs/symptoms of physical distress.    Intensity THRR unchanged      Progression   Progression Continue to progress workloads to maintain intensity without signs/symptoms of physical distress.    Average METs 2      Resistance Training   Training Prescription Yes    Weight 3 lb    Reps 10-15      Interval Training   Interval Training No      NuStep   Level 3    Minutes 15    METs 1.9      Track   Laps  30    Minutes 1    METs 2.63      Home Exercise Plan   Plans to continue exercise at Home (comment)   walking, staff videos   Frequency Add 2 additional days to program exercise sessions.    Initial Home Exercises Provided 06/03/21             Nutrition:  Target Goals: Understanding of nutrition guidelines, daily intake of sodium <1535m, cholesterol <2011m calories 30% from fat and 7% or less from saturated fats, daily to have 5 or more servings of fruits and vegetables.  Education: All About Nutrition: -Group instruction provided by verbal, written material, interactive activities, discussions, models, and posters to present general guidelines for heart healthy nutrition including fat, fiber, MyPlate, the role of sodium in heart healthy nutrition, utilization of the nutrition label, and utilization of this knowledge for meal planning. Follow up email sent as well. Written material given at graduation. [facility]  Date 07/14/21  [facility] Review Code 1- Verbalizes Understanding       Biometrics:  Pre Biometrics - 05/18/21 1151       Pre Biometrics   Height 5' 1.5" (1.562 m)    Weight 162 lb 14.4 oz (73.9 kg)     BMI (Calculated) 30.29    Single Leg Stand 1.18 seconds              Nutrition Therapy Plan and Nutrition Goals:  Nutrition Therapy & Goals - 06/07/21 1331       Nutrition Therapy   Diet Heart healthy, low Na    Protein (specify units) 60g    Fiber 25 grams    Whole Grain Foods 3 servings    Saturated Fats 12 max. grams    Fruits and Vegetables 8 servings/day    Sodium 1.5 grams      Personal Nutrition Goals   Nutrition Goal ST: try mixing white and brown rice, eat a rainbow of fruits and vegetables in a weeks time, add nuts and fruit to AM oatmeal LT: LDL cholesterol and triglycerides in normal limits.    Comments Medications first thing in the morning. B: oatmeal or scrambled egg and piece of toast and sometimes bacon L: sandwich D: meat (chicken, hamburger, pork chops) and two vegetables (most all vegetables). She uses wheat bread, she does not cook with salt, olive and canola oil and some fatback on certain items. Drinks: water, coffee (black), unsweet tea. Discussed heart healthy eating and how to lower blood LDL cholesterol and triglycerides.      Intervention Plan   Intervention Prescribe, educate and counsel regarding individualized specific dietary modifications aiming towards targeted core components such as weight, hypertension, lipid management, diabetes, heart failure and other comorbidities.;Nutrition handout(s) given to patient.    Expected Outcomes Short Term Goal: Understand basic principles of dietary content, such as calories, fat, sodium, cholesterol and nutrients.;Short Term Goal: A plan has been developed with personal nutrition goals set during dietitian appointment.;Long Term Goal: Adherence to prescribed nutrition plan.             Nutrition Assessments:  MEDIFICTS Score Key: ?70 Need to make dietary changes  40-70 Heart Healthy Diet ? 40 Therapeutic Level Cholesterol Diet  [facility]  Picture Your Plate Total Score on Admission 54  Picture Your Plate Total Score on Discharge 64  Picture Your Plate Scores: <56 Unhealthy dietary pattern with much room for improvement. 41-50 Dietary pattern unlikely to meet recommendations for good health and room for improvement. 51-60 More healthful dietary pattern, with some room for improvement.  >60 Healthy dietary pattern, although there may be some specific behaviors that could be improved.    Nutrition Goals Re-Evaluation:  Nutrition Goals Re-Evaluation     [place] Name 06/17/21 1410 07/21/21 1429           Goals   Nutrition Goal ST: try mixing white and brown rice, eat a rainbow of fruits and vegetables in a weeks time, add nuts and fruit to AM oatmeal LT: LDL cholesterol and triglycerides in normal limits. ST: try mixing white and brown rice, eat a rainbow of fruits and vegetables in a weeks time, add nuts and fruit to AM oatmeal LT: LDL cholesterol and triglycerides in normal limits.      Comment Gargi is doing well in rehab.  However, she is concerned that her weight is creeping up. She is trying to eat more healthy options.  We talked about whether she is eating more now that she is exercising more.  She continue to work on her diet She eats fruit, green and yellow vegetbales. She limits fatty meat. She uses canola oil to cook. She does not use salt with cooking. She has not tried out the short term goals we made, but would like to do so.      Expected Outcome Short: Make sure she is getting enough protein Long: Continue to eat heart healthy ST: try mixing white and brown rice, eat a rainbow of fruits and vegetables in a weeks time, add nuts and fruit to AM oatmeal LT: LDL cholesterol and triglycerides in normal limits.               Nutrition Goals Discharge (Final Nutrition Goals Re-Evaluation):  Nutrition Goals Re-Evaluation - 07/21/21 1429       Goals   Nutrition Goal ST: try mixing white and brown rice,  eat a rainbow of fruits and vegetables in a weeks time, add nuts and fruit to AM oatmeal LT: LDL cholesterol and triglycerides in normal limits.    Comment She eats fruit, green and yellow vegetbales. She limits fatty meat. She uses canola oil to cook. She does not use salt with cooking. She has not tried out the short term goals we made, but would like to do so.    Expected Outcome ST: try mixing white and brown rice, eat a rainbow of fruits and vegetables in a weeks time, add nuts and fruit to AM oatmeal LT: LDL cholesterol and triglycerides in normal limits.             Psychosocial: Target Goals: Acknowledge presence or absence of significant depression and/or stress, maximize coping skills, provide positive support system. Participant is able to verbalize types and ability to use techniques and skills needed for reducing stress and depression.   Education: Stress, Anxiety, and Depression - Group verbal and visual presentation to define topics covered.  Reviews how body is impacted by stress, anxiety, and depression.  Also discusses healthy ways to reduce stress and to treat/manage anxiety and depression.  Written material given at graduation. Flowsheet Row Cardiac Rehab from 07/21/2021 in [facility]  Date 06/02/21  Educator AS  Instruction Review Code 1- Verbalizes Understanding       Education: Sleep Hygiene -Provides group verbal and written instruction about how sleep  can affect your health.  Define sleep hygiene, discuss sleep cycles and impact of sleep habits. Review good sleep hygiene tips.    Initial Review & Psychosocial Screening:  Initial Psych Review & Screening - 05/07/21 1411       Initial Review   Current issues with None Identified      Family Dynamics   Good Support System? Yes   grandchildren     Barriers   Psychosocial barriers to participate in program There are no identifiable barriers or psychosocial needs.;The patient should  benefit from training in stress management and relaxation.      Screening Interventions   Interventions Encouraged to exercise;Provide feedback about the scores to participant;To provide support and resources with identified psychosocial needs    Expected Outcomes Short Term goal: Utilizing psychosocial counselor, staff and physician to assist with identification of specific Stressors or current issues interfering with healing process. Setting desired goal for each stressor or current issue identified.;Long Term Goal: Stressors or current issues are controlled or eliminated.;Long Term goal: The participant improves quality of Life and PHQ9 Scores as seen by post scores and/or verbalization of changes;Short Term goal: Identification and review with participant of any Quality of Life or Depression concerns found by scoring the questionnaire.             Quality of Life Scores:   Quality of Life - 07/28/21 1449       Quality of Life Scores   Health/Function Pre 20 %    Health/Function Post 22.07 %    Health/Function % Change 10.35 %    Socioeconomic Pre 24.1 %    Socioeconomic Post 29.38 %    Socioeconomic % Change  21.91 %    Psych/Spiritual Pre 25 %    Psych/Spiritual Post 24.86 %    Psych/Spiritual % Change -0.56 %    Family Pre 24 %    Family Post 26.5 %    Family % Change 10.42 %    GLOBAL Pre 22.31 %    GLOBAL Post 24.93 %    GLOBAL % Change 11.74 %            Scores of 19 and below usually indicate a poorer quality of life in these areas.  A difference of  2-3 points is a clinically meaningful difference.  A difference of 2-3 points in the total score of the Quality of Life Index has been associated with significant improvement in overall quality of life, self-image, physical symptoms, and general health in studies assessing change in quality of life.  PHQ-9: Recent Review Flowsheet Data     Depression screen [facility] 2/9 07/28/2021 05/18/2021   Decreased Interest 0 1    Down, Depressed, Hopeless 1 1   PHQ - 2 Score 1 2   Altered sleeping 0 0   Tired, decreased energy 1 1   Change in appetite 0 0   Feeling bad or failure about yourself  1 0   Trouble concentrating 0 0   Moving slowly or fidgety/restless 0 0   Suicidal thoughts 0 0   PHQ-9 Score 3 3   Difficult doing work/chores Not difficult at all Not difficult at all      Interpretation of Total Score  Total Score Depression Severity:  1-4 = Minimal depression, 5-9 = Mild depression, 10-14 = Moderate depression, 15-19 = Moderately severe depression, 20-27 = Severe depression   Psychosocial Evaluation and Intervention:  Psychosocial Evaluation - 05/07/21 1413  Psychosocial Evaluation & Interventions   Interventions Encouraged to exercise with the program and follow exercise prescription    Comments Ms. Schmeling is coming to cardiac rehab post NSTEMI. She states she is feeling pretty good, she completed hh pt. Her granddaughter is very helpful, looking out for her and helping her with meals. She doesn't report any current stressors, just the "usual" and that she manages it well. She has had a history of balance concerns so she is glad that this program may help with that. She takes all her medicine like she is supposed to, weighs daily, checks her BP  regularly and wears her CPAP nightly, so she is ready to add cardiac rehab to her list of things to do to make herself feel better.    Expected Outcomes Short: attend cardiac rehab for education and exercise. Long: develop & maintian positive self care habits    Continue Psychosocial Services  Follow up required by staff             Psychosocial Re-Evaluation:  Psychosocial Re-Evaluation     [place] Name 06/17/21 1408 07/21/21 1430           Psychosocial Re-Evaluation   Current issues with Current Stress Concerns Current Stress Concerns      Comments Laquesha is doing well in rehab. She has three grandkids that live with her and school is  getting ready to start so mornings will get a little more hectic.  She does her best to stay out of the way.  She has a new grandbaby turning three months this month.  She sleeps well most days. Acacia is doing well in rehab. She has a friend that she talks to about her stress. She has a surgery coming up and some things medically that she is not sure what it is that is giving her stress. It is not something she would like to talk about or discuss right now. She can't do most of the things that she likes to do due to her vision. She reports sleeping well.      Expected Outcomes Short: Adjust to new school schedule Long: Contiue to focus on positive Short: continue to use friend for support during this time Long: Contiue to focus on positive      Interventions Encouraged to attend Cardiac Rehabilitation for the exercise Encouraged to attend Cardiac Rehabilitation for the exercise      Continue Psychosocial Services  Follow up required by staff Follow up required by staff             Initial Review   Source of Stress Concerns -- Chronic Illness               Psychosocial Discharge (Final Psychosocial Re-Evaluation):  Psychosocial Re-Evaluation - 07/21/21 1430       Psychosocial Re-Evaluation   Current issues with Current Stress Concerns    Comments Lauralee is doing well in rehab. She has a friend that she talks to about her stress. She has a surgery coming up and some things medically that she is not sure what it is that is giving her stress. It is not something she would like to talk about or discuss right now. She can't do most of the things that she likes to do due to her vision. She reports sleeping well.    Expected Outcomes Short: continue to use friend for support during this time Long: Contiue to focus on positive    Interventions Encouraged to attend Cardiac Rehabilitation  for the exercise    Continue Psychosocial Services  Follow up required by staff      Initial Review   Source  of Stress Concerns Chronic Illness             Vocational Rehabilitation: Provide vocational rehab assistance to qualifying candidates.   Vocational Rehab Evaluation & Intervention:   Education: Education Goals: Education classes will be provided on a variety of topics geared toward better understanding of heart health and risk factor modification. Participant will state understanding/return demonstration of topics presented as noted by education test scores.  Learning Barriers/Preferences:  Learning Barriers/Preferences - 05/07/21 1411       Learning Barriers/Preferences   Learning Barriers None    Learning Preferences None             General Cardiac Education Topics:  AED/CPR: - Group verbal and written instruction with the use of models to demonstrate the basic use of the AED with the basic ABC's of resuscitation.   Anatomy and Cardiac Procedures: - Group verbal and visual presentation and models provide information about basic cardiac anatomy and function. Reviews the testing methods done to diagnose heart disease and the outcomes of the test results. Describes the treatment choices: Medical Management, Angioplasty, or Coronary Bypass Surgery for treating various heart conditions including Myocardial Infarction, Angina, Valve Disease, and Cardiac Arrhythmias.  Written material given at graduation. Flowsheet Row Cardiac Rehab from 07/21/2021 in [facility] Cardiac and Pulmonary Rehab  Date 06/23/21  Educator [facility]  Instruction Review Code 1- Verbalizes Understanding       Medication Safety: - Group verbal and visual instruction to review commonly prescribed medications for heart and lung disease. Reviews the medication, class of the drug, and side effects. Includes the steps to properly store meds and maintain the prescription regimen.  Written material given at graduation. Flowsheet Row Cardiac Rehab from 07/21/2021 in [facility] Cardiac and Pulmonary Rehab  Date 07/07/21   Educator [facility]  Instruction Review Code 1- Verbalizes Understanding       Intimacy: - Group verbal instruction through game format to discuss how heart and lung disease can affect sexual intimacy. Written material given at graduation.. Flowsheet Row Cardiac Rehab from 07/21/2021 in [facility] Cardiac and Pulmonary Rehab  Date 06/16/21  Educator AS  Instruction Review Code 1- Verbalizes Understanding       Know Your Numbers and Heart Failure: - Group verbal and visual instruction to discuss disease risk factors for cardiac and pulmonary disease and treatment options.  Reviews associated critical values for Overweight/Obesity, Hypertension, Cholesterol, and Diabetes.  Discusses basics of heart failure: signs/symptoms and treatments.  Introduces Heart Failure Zone chart for action plan for heart failure.  Written material given at graduation. Flowsheet Row Cardiac Rehab from 07/21/2021 in [facility] Cardiac and Pulmonary Rehab  Date 07/21/21  Educator [facility]  Instruction Review Code 1- Verbalizes Understanding       Infection Prevention: - Provides verbal and written material to individual with discussion of infection control including proper hand washing and proper equipment cleaning during exercise session. Flowsheet Row Cardiac Rehab from 07/21/2021 in [facility] Cardiac and Pulmonary Rehab  Education need identified 05/18/21  Date 05/18/21  Educator [place]  Instruction Review Code 1- Verbalizes Understanding       Falls Prevention: - Provides verbal and written material to individual with discussion of falls prevention and safety. Flowsheet Row Cardiac Rehab from 07/21/2021 in [facility] Cardiac and Pulmonary Rehab  Education need identified 05/18/21  Date 05/18/21  Educator [facility]  Instruction Review Code 1- Verbalizes Understanding       Other: -Provides group and verbal instruction on various topics (see comments)   Knowledge Questionnaire Score:  Knowledge Questionnaire Score - 07/28/21 1448        Knowledge Questionnaire Score   Pre Score 22/26: Angina, Exercise    Post Score 24/26             Core Components/Risk Factors/Patient Goals at Admission:  Personal Goals and Risk Factors at Admission - 05/18/21 1524       Core Components/Risk Factors/Patient Goals on Admission    Weight Management Yes;Weight Loss    Intervention Weight Management: Develop a combined nutrition and exercise program designed to reach desired caloric intake, while maintaining appropriate intake of nutrient and fiber, sodium and fats, and appropriate energy expenditure required for the weight goal.;Weight Management: Provide education and appropriate resources to help participant work on and attain dietary goals.;Weight Management/Obesity: Establish reasonable short term and long term weight goals.;Obesity: Provide education and appropriate resources to help participant work on and attain dietary goals.    Admit Weight 162 lb (73.5 kg)    Goal Weight: Short Term 157 lb (71.2 kg)    Goal Weight: Long Term 152 lb (68.9 kg)    Expected Outcomes Short Term: Continue to assess and modify interventions until short term weight is achieved;Long Term: Adherence to nutrition and physical activity/exercise program aimed toward attainment of established weight goal;Weight Loss: Understanding of general recommendations for a balanced deficit meal plan, which promotes 1-2 lb weight loss per week and includes a negative energy balance of [phone] kcal/d;Understanding recommendations for meals to include 15-35% energy as protein, 25-35% energy from fat, 35-60% energy from carbohydrates, less than 226m of dietary cholesterol, 20-35 gm of total fiber daily;Understanding of distribution of calorie intake throughout the day with the consumption of 4-5 meals/snacks    Hypertension Yes    Intervention Provide education on lifestyle modifcations including regular physical activity/exercise, weight management, moderate sodium  restriction and increased consumption of fresh fruit, vegetables, and low fat dairy, alcohol moderation, and smoking cessation.;Monitor prescription use compliance.    Expected Outcomes Short Term: Continued assessment and intervention until BP is < 140/931mHG in hypertensive participants. < 130/8065mG in hypertensive participants with diabetes, heart failure or chronic kidney disease.;Long Term: Maintenance of blood pressure at goal levels.    Lipids Yes    Intervention Provide education and support for participant on nutrition & aerobic/resistive exercise along with prescribed medications to achieve LDL <26m61mDL >40mg53m Expected Outcomes Short Term: Participant states understanding of desired cholesterol values and is compliant with medications prescribed. Participant is following exercise prescription and nutrition guidelines.;Long Term: Cholesterol controlled with medications as prescribed, with individualized exercise RX and with personalized nutrition plan. Value goals: LDL < 26mg,55m > 40 mg.             Education:Diabetes - Individual verbal and written instruction to review signs/symptoms of diabetes, desired ranges of glucose level fasting, after meals and with exercise. Acknowledge that pre and post exercise glucose checks will be done for 3 sessions at entry of program.   Core Components/Risk Factors/Patient Goals Review:   Goals and Risk Factor Review     Row Name 06/17/21 1436 07/21/21 1422           Core Components/Risk Factors/Patient Goals Review   Personal Goals Review Weight Management/Obesity;Hypertension;Lipids Weight Management/Obesity;Hypertension;Lipids      Review Shanikia is doing well in  rehab.  Her weight has been creeping up and she really wants to try to lose!  We talked about watching her protein and limiting fats.  Her pressures have been good and she does check them at home. She is feeling good with taking her medications and feels well managed.  Ravneet has been doing well in rehab. She takes her BP on a regular basis - usually in the morning (she feels it is alwasy a bit high, but doesn't remeber where it usually sits). Today her incoming BP was 156/78. Her BP has been creeping up during rehab and she gave her doctor a printout of her BP, still waiting for a response. Her weight is stable. She is still taking her medication as directed with no issues.      Expected Outcomes Short: Work on weight loss.  Long: Continue to monitor risk factors. Short: Wait for response from MD about BP  Long: Continue to monitor risk factors.               Core Components/Risk Factors/Patient Goals at Discharge (Final Review):   Goals and Risk Factor Review - 07/21/21 1422       Core Components/Risk Factors/Patient Goals Review   Personal Goals Review Weight Management/Obesity;Hypertension;Lipids    Review Noel has been doing well in rehab. She takes her BP on a regular basis - usually in the morning (she feels it is alwasy a bit high, but doesn't remeber where it usually sits). Today her incoming BP was 156/78. Her BP has been creeping up during rehab and she gave her doctor a printout of her BP, still waiting for a response. Her weight is stable. She is still taking her medication as directed with no issues.    Expected Outcomes Short: Wait for response from MD about BP  Long: Continue to monitor risk factors.             ITP Comments:  ITP Comments     Row Name 05/07/21 1417 05/18/21 1142 05/24/21 1414 06/02/21 0745 06/07/21 1426   ITP Comments Initial telephone orientation completed. Diagnosis can be found in [facility] 6/9. EP orientation scheduled for Tuesday 7/25 at 10am Completed 6MWT and gym orientation. Initial ITP created and sent for review to Dr. Emily Filbert, Medical Director. First full day of exercise!  Patient was oriented to gym and equipment including functions, settings, policies, and procedures.  Patient's individual exercise  prescription and treatment plan were reviewed.  All starting workloads were established based on the results of the 6 minute walk test done at initial orientation visit.  The plan for exercise progression was also introduced and progression will be customized based on patient's performance and goals. 30 Day review completed. Medical Director ITP review done, changes made as directed, and signed approval by Medical Director. Completed initial RD consultation    Row Name 06/30/21 0644 07/26/21 1427 07/26/21 1507 07/28/21 0940 07/28/21 1441   ITP Comments 30 Day review completed. Medical Director ITP review done, changes made as directed, and signed approval by Medical Director. Pt able to follow exercise prescription today without complaint. Patient's BP is elevated at arrival/at rest. Patient is asymptomatic and instructed to exercise on the seated machine with a light load today. BP regularly assessed and monitored. Patient continues to be asymptomatic. Patient's BP is elevated at arrival/at rest. Patient is asymptomatic and instructed to exercise on the seated machine with a light load today. BP continued to be elevated and she was stopped from exercising.  Patient was educated about taking medication as ordered and patient stated understanding. Jettie Booze, PA, in cardiology office contacted. PA replied that patient should take 67m telmisartan once daily and not split dose, and that patient has a return appointment scheduled for November. Patient completed paperwork today to prepare for discharge at next visit. Patient continues to be asymptomatic and left to go home. 30 day review completed. ITP sent to Dr. MEmily Filbert Medical Director of Cardiac Rehab. Continue with ITP unless changes are made by physician. Francisco graduated today from  rehab with 33 sessions completed.  Details of the patient's exercise prescription and what She needs to do in order to continue the prescription and progress were discussed  with patient.  Patient was given a copy of prescription and goals.  Patient verbalized understanding.  Elisia plans to continue to exercise by walking and doing exercises learned while participating in rehab.            Comments: discharge ITP

## 2021-07-28 NOTE — ED Triage Notes (Signed)
Arrives via [facility] from home. Mechanical fall forward onto sidewalk.  No LOC.  Right knee injury.  Right arm skin tear.  Denies blood thinner use.  VS wnl per report.  Has history of HTN.  18g Left forearm.

## 2021-07-28 NOTE — Progress Notes (Signed)
Discharge Progress Report  Patient Details  Name: Andrea Phillips MRN: [id number] Date of Birth: August 27, 1935 Referring Provider:   Flowsheet Row Cardiac Rehab from 05/18/2021 in [facility]  Referring Provider Arnoldo Hooker MD        Number of Visits: 33  Reason for Discharge:  Patient independent in their exercise. Early Exit:  Andrea Phillips has been experiencing uncontrolled hypertension and will be working with her primary care physician to address concerns. It is not safe for her to continue exercising at this time. Cardiologist has been notified of the patient's and staff's concerns concerning consistent elevated blood pressures.  Smoking History:  Social History   Tobacco Use  Smoking Status Never  Smokeless Tobacco Never    Diagnosis:  NSTEMI (non-ST elevated myocardial infarction) (HCC)  ADL [facility]SD:   Initial Exercise Prescription:  Initial Exercise Prescription - 05/18/21 1500       Date of Initial Exercise RX and Referring Provider   Date 05/18/21    Referring Provider Arnoldo Hooker MD      NuStep   Level 1    SPM 80    Minutes 15    METs 1.7      Arm Ergometer   Level 1    RPM 30    Minutes 15    METs 1.7      Biostep-RELP   Level 1    SPM 50    Minutes 15    METs 1.7      Track   Laps 16    Minutes 15    METs 1.87      Prescription Details   Frequency (times per week) 3    Duration Progress to 30 minutes of continuous aerobic without signs/symptoms of physical distress      Intensity   THRR 40-80% of Max Heartrate 91-121    Ratings of Perceived Exertion 11-13    Perceived Dyspnea 0-4      Progression   Progression Continue to progress workloads to maintain intensity without signs/symptoms of physical distress.      Resistance Training   Training Prescription Yes    Weight 3 lb    Reps 10-15             Discharge Exercise Prescription (Final Exercise Prescription Changes):  Exercise Prescription  Changes - 07/26/21 0800       Response to Exercise   Blood Pressure (Admit) 132/70    Blood Pressure (Exit) 126/64    Heart Rate (Admit) 69 bpm    Heart Rate (Exercise) 111 bpm    Heart Rate (Exit) 79 bpm    Rating of Perceived Exertion (Exercise) 12    Symptoms none    Duration Continue with 30 min of aerobic exercise without signs/symptoms of physical distress.    Intensity THRR unchanged      Progression   Progression Continue to progress workloads to maintain intensity without signs/symptoms of physical distress.    Average METs 2      Resistance Training   Training Prescription Yes    Weight 3 lb    Reps 10-15      Interval Training   Interval Training No      NuStep   Level 3    Minutes 15    METs 1.9      Track   Laps 30    Minutes 1    METs 2.63      Home Exercise Plan   Plans to continue  exercise at Home (comment)   walking, staff videos   Frequency Add 2 additional days to program exercise sessions.    Initial Home Exercises Provided 06/03/21             Functional Capacity:  6 Minute Walk     Row Name 05/18/21 1155 07/21/21 1428       6 Minute Walk   Phase Initial --    Distance 1000 feet 1260 feet    Distance % Change -- 26 %    Distance Feet Change -- 260 ft    Walk Time 6 minutes 6 minutes    # of Rest Breaks 0 0    MPH 1.89 2.38    METS 1.73 2.83    RPE 11 13    Perceived Dyspnea  1 1    VO2 Peak 6.06 9.9    Symptoms Yes (comment) No    Comments Fatigued, slight dizziness- resolved with rest --    Resting HR 62 bpm 74 bpm    Resting BP 140/74 156/76    Resting Oxygen Saturation  96 % 98 %    Exercise Oxygen Saturation  during 6 min walk 96 % 90 %    Max Ex. HR 99 bpm 121 bpm    Max Ex. BP 182/70  RCK 150/72 220/76    2 Minute Post BP 140/72 184/84             Psychological, QOL, Others - Outcomes: PHQ 2/9: Depression screen [facility] 2/9 07/28/2021 05/18/2021  Decreased Interest 0 1  Down, Depressed, Hopeless 1 1  PHQ - 2  Score 1 2  Altered sleeping 0 0  Tired, decreased energy 1 1  Change in appetite 0 0  Feeling bad or failure about yourself  1 0  Trouble concentrating 0 0  Moving slowly or fidgety/restless 0 0  Suicidal thoughts 0 0  PHQ-9 Score 3 3  Difficult doing work/chores Not difficult at all Not difficult at all    Quality of Life:  Quality of Life - 07/28/21 1449       Quality of Life Scores   Health/Function Pre 20 %    Health/Function Post 22.07 %    Health/Function % Change 10.35 %    Socioeconomic Pre 24.1 %    Socioeconomic Post 29.38 %    Socioeconomic % Change  21.91 %    Psych/Spiritual Pre 25 %    Psych/Spiritual Post 24.86 %    Psych/Spiritual % Change -0.56 %    Family Pre 24 %    Family Post 26.5 %    Family % Change 10.42 %    GLOBAL Pre 22.31 %    GLOBAL Post 24.93 %    GLOBAL % Change 11.74 %              Nutrition & Weight - Outcomes:  Pre Biometrics - 05/18/21 1151       Pre Biometrics   Height 5' 1.5" (1.562 m)    Weight 162 lb 14.4 oz (73.9 kg)    BMI (Calculated) 30.29    Single Leg Stand 1.18 seconds              Nutrition:  Nutrition Therapy & Goals - 06/07/21 1331       Nutrition Therapy   Diet Heart healthy, low Na    Protein (specify units) 60g    Fiber 25 grams    Whole Grain Foods 3 servings    Saturated  Fats 12 max. grams    Fruits and Vegetables 8 servings/day    Sodium 1.5 grams      Personal Nutrition Goals   Nutrition Goal ST: try mixing white and brown rice, eat a rainbow of fruits and vegetables in a weeks time, add nuts and fruit to AM oatmeal LT: LDL cholesterol and triglycerides in normal limits.    Comments Medications first thing in the morning. B: oatmeal or scrambled egg and piece of toast and sometimes bacon L: sandwich D: meat (chicken, hamburger, pork chops) and two vegetables (most all vegetables). She uses wheat bread, she does not cook with salt, olive and canola oil and some fatback on certain items.  Drinks: water, coffee (black), unsweet tea. Discussed heart healthy eating and how to lower blood LDL cholesterol and triglycerides.      Intervention Plan   Intervention Prescribe, educate and counsel regarding individualized specific dietary modifications aiming towards targeted core components such as weight, hypertension, lipid management, diabetes, heart failure and other comorbidities.;Nutrition handout(s) given to patient.    Expected Outcomes Short Term Goal: Understand basic principles of dietary content, such as calories, fat, sodium, cholesterol and nutrients.;Short Term Goal: A plan has been developed with personal nutrition goals set during dietitian appointment.;Long Term Goal: Adherence to prescribed nutrition plan.             Nutrition Discharge:   Education Questionnaire Score:  Knowledge Questionnaire Score - 07/28/21 1448       Knowledge Questionnaire Score   Pre Score 22/26: Angina, Exercise    Post Score 24/26             Goals reviewed with patient; copy given to patient.

## 2021-07-28 NOTE — Patient Instructions (Signed)
Discharge Patient Instructions  Patient Details  Name: Andrea Phillips MRN: [id number] Date of Birth: 1935-05-29 Referring Provider:  Corey Skains, MD   Number of Visits: 58  Reason for Discharge:  Patient reached a stable level of exercise. Patient independent in their exercise. Patient has met program and personal goals.   Diagnosis:  NSTEMI (non-ST elevated myocardial infarction) [facility])  Initial Exercise Prescription:  Initial Exercise Prescription - 05/18/21 1500       Date of Initial Exercise RX and Referring Provider   Date 05/18/21    Referring Provider Serafina Royals MD      NuStep   Level 1    SPM 80    Minutes 15    METs 1.7      Arm Ergometer   Level 1    RPM 30    Minutes 15    METs 1.7      Biostep-RELP   Level 1    SPM 50    Minutes 15    METs 1.7      Track   Laps 16    Minutes 15    METs 1.87      Prescription Details   Frequency (times per week) 3    Duration Progress to 30 minutes of continuous aerobic without signs/symptoms of physical distress      Intensity   THRR 40-80% of Max Heartrate 91-121    Ratings of Perceived Exertion 11-13    Perceived Dyspnea 0-4      Progression   Progression Continue to progress workloads to maintain intensity without signs/symptoms of physical distress.      Resistance Training   Training Prescription Yes    Weight 3 lb    Reps 10-15             Discharge Exercise Prescription (Final Exercise Prescription Changes):  Exercise Prescription Changes - 07/26/21 0800       Response to Exercise   Blood Pressure (Admit) 132/70    Blood Pressure (Exit) 126/64    Heart Rate (Admit) 69 bpm    Heart Rate (Exercise) 111 bpm    Heart Rate (Exit) 79 bpm    Rating of Perceived Exertion (Exercise) 12    Symptoms none    Duration Continue with 30 min of aerobic exercise without signs/symptoms of physical distress.    Intensity THRR unchanged      Progression   Progression Continue to  progress workloads to maintain intensity without signs/symptoms of physical distress.    Average METs 2      Resistance Training   Training Prescription Yes    Weight 3 lb    Reps 10-15      Interval Training   Interval Training No      NuStep   Level 3    Minutes 15    METs 1.9      Track   Laps 30    Minutes 1    METs 2.63      Home Exercise Plan   Plans to continue exercise at Home (comment)   walking, staff videos   Frequency Add 2 additional days to program exercise sessions.    Initial Home Exercises Provided 06/03/21             Functional Capacity:  6 Minute Walk     Row Name 05/18/21 1155 07/21/21 1428       6 Minute Walk   Phase Initial --    Distance 1000 feet 1260  feet    Distance % Change -- 26 %    Distance Feet Change -- 260 ft    Walk Time 6 minutes 6 minutes    # of Rest Breaks 0 0    MPH 1.89 2.38    METS 1.73 2.83    RPE 11 13    Perceived Dyspnea  1 1    VO2 Peak 6.06 9.9    Symptoms Yes (comment) No    Comments Fatigued, slight dizziness- resolved with rest --    Resting HR 62 bpm 74 bpm    Resting BP 140/74 156/76    Resting Oxygen Saturation  96 % 98 %    Exercise Oxygen Saturation  during 6 min walk 96 % 90 %    Max Ex. HR 99 bpm 121 bpm    Max Ex. BP 182/70  RCK 150/72 220/76    2 Minute Post BP 140/72 184/84             Nutrition & Weight - Outcomes:  Pre Biometrics - 05/18/21 1151       Pre Biometrics   Height 5' 1.5" (1.562 m)    Weight 162 lb 14.4 oz (73.9 kg)    BMI (Calculated) 30.29    Single Leg Stand 1.18 seconds             Nutrition:  Nutrition Therapy & Goals - 06/07/21 1331       Nutrition Therapy   Diet Heart healthy, low Na    Protein (specify units) 60g    Fiber 25 grams    Whole Grain Foods 3 servings    Saturated Fats 12 max. grams    Fruits and Vegetables 8 servings/day    Sodium 1.5 grams      Personal Nutrition Goals   Nutrition Goal ST: try mixing white and brown rice, eat a  rainbow of fruits and vegetables in a weeks time, add nuts and fruit to AM oatmeal LT: LDL cholesterol and triglycerides in normal limits.    Comments Medications first thing in the morning. B: oatmeal or scrambled egg and piece of toast and sometimes bacon L: sandwich D: meat (chicken, hamburger, pork chops) and two vegetables (most all vegetables). She uses wheat bread, she does not cook with salt, olive and canola oil and some fatback on certain items. Drinks: water, coffee (black), unsweet tea. Discussed heart healthy eating and how to lower blood LDL cholesterol and triglycerides.      Intervention Plan   Intervention Prescribe, educate and counsel regarding individualized specific dietary modifications aiming towards targeted core components such as weight, hypertension, lipid management, diabetes, heart failure and other comorbidities.;Nutrition handout(s) given to patient.    Expected Outcomes Short Term Goal: Understand basic principles of dietary content, such as calories, fat, sodium, cholesterol and nutrients.;Short Term Goal: A plan has been developed with personal nutrition goals set during dietitian appointment.;Long Term Goal: Adherence to prescribed nutrition plan.             Nutrition Discharge:   Education Questionnaire Score:  Knowledge Questionnaire Score - 05/18/21 1501       Knowledge Questionnaire Score   Pre Score 22/26: Angina, Exercise             Goals reviewed with patient; copy given to patient.

## 2021-07-28 NOTE — ED Provider Notes (Signed)
[facility]EMERGENCY DEPARTMENT  ____________________________________________  Time seen: Approximately 8:43 PM  I have reviewed the triage vital signs and the nursing notes.   HISTORY  Chief Complaint Fall   Historian Patient     HPI Andrea Phillips is a 86 y.o. female presents to the emergency department after a mechanical fall after patient lost her footing on sidewalk.  Patient did hit her head.  She has a small skin tear along her right forearm.  She denies headache or neck pain.  Denies blood thinner usage.  Patient is also concerned about bilateral knee pain.  She has been able to ambulate since fall occurred.  She reports that her tetanus status is up-to-date.   Past Medical History:  Diagnosis Date   Arthritis    ra   Asthma    CHF (congestive heart failure) (HCC)    Dysrhythmia    Edema extremities    GERD (gastroesophageal reflux disease)    Gout    Headache    History of hiatal hernia    Hypertension    Hypothyroidism    Shortness of breath dyspnea    Sleep apnea      Immunizations up to date:  Yes.     Past Medical History:  Diagnosis Date   Arthritis    ra   Asthma    CHF (congestive heart failure) (HCC)    Dysrhythmia    Edema extremities    GERD (gastroesophageal reflux disease)    Gout    Headache    History of hiatal hernia    Hypertension    Hypothyroidism    Shortness of breath dyspnea    Sleep apnea     Patient Active Problem List   Diagnosis Date Noted   NSTEMI (non-ST elevated myocardial infarction) (HCC) 04/02/2021   Acute on chronic systolic CHF (congestive heart failure) (HCC) 04/02/2021   Hypothyroidism 04/02/2021   Syncope and collapse 04/07/2020   Right hip pain 04/07/2020   Frequent PVCs 04/07/2020   OSA (obstructive sleep apnea) 04/07/2020   Elevated troponin 04/07/2020   Varicose veins of both lower extremities with pain 04/04/2017   Essential hypertension 04/04/2017   Hyperlipidemia, mixed 11/22/2006     Past Surgical History:  Procedure Laterality Date   APPENDECTOMY     BACK SURGERY     CARDIAC CATHETERIZATION     CATARACT EXTRACTION W/PHACO Left 03/09/2015   Procedure: CATARACT EXTRACTION PHACO AND INTRAOCULAR LENS PLACEMENT (IOC);  Surgeon: Sallee Lange, MD;  Location: [facility] ORS;  Service: Ophthalmology;  Laterality: Left;  [place] 01:31 AP% 26.1 CDE 43.72   CHOLECYSTECTOMY     EYE SURGERY     retina   JOINT REPLACEMENT     left knee   LEFT HEART CATH AND CORONARY ANGIOGRAPHY N/A 04/02/2021   Procedure: LEFT HEART CATH AND CORONARY ANGIOGRAPHY;  Surgeon: Lamar Blinks, MD;  Location: [facility] INVASIVE CV [facility];  Service: Cardiovascular;  Laterality: N/A;    Prior to Admission medications   Medication Sig Start Date End Date Taking? Authorizing Provider  acetaminophen (TYLENOL) 325 MG tablet Take 325-650 mg by mouth every 6 (six) hours as needed for moderate pain.    [provider]  albuterol (VENTOLIN HFA) 108 (90 Base) MCG/ACT inhaler Inhale 2 puffs into the lungs every 6 (six) hours as needed for wheezing or shortness of breath.    [provider]  Cholecalciferol (VITAMIN D3) 1.25 MG (50000 UT) TABS Take 1,000 Units by mouth once a week.  [provider]  fluocinonide (LIDEX) 0.05 % external solution Apply 1 application topically daily as needed (skin irritations).    [provider]  fluticasone (FLONASE) 50 MCG/ACT nasal spray Place 1 spray into both nostrils 2 (two) times daily as needed for allergies or rhinitis.    [provider]  furosemide (LASIX) 40 MG tablet Take 1 tablet (40 mg total) by mouth daily. Patient taking differently: Take 20 mg by mouth daily. 04/03/21 04/03/22  Almon Hercules, MD  guaiFENesin (MUCINEX) 600 MG 12 hr tablet Take 1 tablet (600 mg total) by mouth 2 (two) times daily. 04/04/21   Almon Hercules, MD  HYDROcodone-acetaminophen (NORCO/VICODIN) 5-325 MG tablet Take 1-2 tablets by mouth every 6 (six) hours as  needed for severe pain. Patient not taking: Reported on 06/09/2021 04/09/20   Glade Lloyd, MD  icosapent Ethyl (VASCEPA) 1 g capsule Take 2 g by mouth 2 (two) times daily with a meal.    [provider]  levothyroxine (SYNTHROID) 112 MCG tablet Take 112 mcg by mouth daily. 02/23/21   [provider]  Magnesium Oxide 250 MG TABS Take 1-2 tablets by mouth daily as needed.    [provider]  metoprolol succinate (TOPROL-XL) 50 MG 24 hr tablet Take 1 tablet (50 mg total) by mouth daily. Take with or immediately following a meal. Patient taking differently: Take 25 mg by mouth daily. Take with or immediately following a meal. 04/05/21   Gonfa, Boyce Medici, MD  mometasone-formoterol (DULERA) 200-5 MCG/ACT AERO Inhale 2 puffs into the lungs 2 (two) times daily. 04/04/21   Almon Hercules, MD  omeprazole (PRILOSEC OTC) 20 MG tablet Take 2 tablets (40 mg total) by mouth daily. 04/04/21   Almon Hercules, MD  telmisartan (MICARDIS) 40 MG tablet Take 40 mg by mouth daily. 03/06/21   [provider]    Allergies Other, Ace inhibitors, Amlodipine, Aspirin, Atorvastatin, Codeine, Conj estrog-medroxyprogest ace, Prednisone, Raloxifene, and Valsartan  Family History  Problem Relation Age of Onset   Breast cancer Cousin     Social History Social History   Tobacco Use   Smoking status: Never   Smokeless tobacco: Never  Substance Use Topics   Alcohol use: No   Drug use: No     Review of Systems  Constitutional: No fever/chills Eyes:  No discharge ENT: Patient has facial pain.  Respiratory: no cough. No SOB/ use of accessory muscles to breath Gastrointestinal:   No nausea, no vomiting.  No diarrhea.  No constipation. Musculoskeletal: Negative for musculoskeletal pain. Skin: Negative for rash, abrasions, lacerations, ecchymosis.    ____________________________________________   PHYSICAL EXAM:  VITAL SIGNS: ED Triage Vitals  Enc Vitals Group     BP 07/28/21 1846  (!) 194/70     Pulse Rate 07/28/21 1846 65     Resp 07/28/21 1846 18     Temp 07/28/21 1846 98.9 F (37.2 C)     Temp Source 07/28/21 1846 Oral     SpO2 07/28/21 1846 96 %     Weight 07/28/21 1840 160 lb 0.9 oz (72.6 kg)     Height 07/28/21 1840 5' (1.524 m)     Head Circumference --      Peak Flow --      Pain Score --      Pain Loc --      Pain Edu? --      Excl. in GC? --      Constitutional: Alert and oriented. Well  appearing and in no acute distress. Eyes: Conjunctivae are normal. PERRL. EOMI. Head: Atraumatic.  Patient has facial bruising. ENT:      Nose: No congestion/rhinnorhea.      Mouth/Throat: Mucous membranes are moist.  Neck: No stridor.  No cervical spine tenderness to palpation. Cardiovascular: Normal rate, regular rhythm. Normal S1 and S2.  Good peripheral circulation. Respiratory: Normal respiratory effort without tachypnea or retractions. Lungs CTAB. Good air entry to the bases with no decreased or absent breath sounds Gastrointestinal: Bowel sounds x 4 quadrants. Soft and nontender to palpation. No guarding or rigidity. No distention. Musculoskeletal: Full range of motion to all extremities. No obvious deformities noted Neurologic:  Normal for age. No gross focal neurologic deficits are appreciated.  Skin: Patient has small skin tear right forearm. Psychiatric: Mood and affect are normal for age. Speech and behavior are normal.   ____________________________________________   LABS (all labs ordered are listed, but only abnormal results are displayed)  Labs Reviewed - No data to display ____________________________________________  EKG   ____________________________________________  RADIOLOGY Geraldo Pitter, personally viewed and evaluated these images (plain radiographs) as part of my medical decision making, as well as reviewing the written report by the radiologist.  DG Knee 2 Views Left  Result Date: 07/28/2021 CLINICAL DATA:  Knee pain  mechanical fall EXAM: LEFT KNEE - 1-2 VIEW COMPARISON:  04/29/2008 FINDINGS: Left knee replacement with intact hardware and normal alignment. No sizable effusion. No acute displaced fracture is seen IMPRESSION: Left knee replacement without acute osseous abnormality Electronically Signed   By: Jasmine Pang M.D.   On: 07/28/2021 19:03   DG Knee 2 Views Right  Result Date: 07/28/2021 CLINICAL DATA:  Fall with knee pain EXAM: RIGHT KNEE - 1-2 VIEW COMPARISON:  None. FINDINGS: No fracture or malalignment. Tricompartment arthritis with moderate disease at the medial tibiofemoral joint space. No sizable knee effusion. IMPRESSION: No acute osseous abnormality.  Arthritis. Electronically Signed   By: Jasmine Pang M.D.   On: 07/28/2021 19:04   CT HEAD WO CONTRAST ( )  Result Date: 07/28/2021 CLINICAL DATA:  Facial trauma. Mechanical fall forward onto sidewalk. No LOC. EXAM: CT HEAD WITHOUT CONTRAST CT MAXILLOFACIAL WITHOUT CONTRAST CT CERVICAL SPINE WITHOUT CONTRAST TECHNIQUE: Multidetector CT imaging of the head, cervical spine, and maxillofacial structures were performed using the standard protocol without intravenous contrast. Multiplanar CT image reconstructions of the cervical spine and maxillofacial structures were also generated. COMPARISON:  CT head and cervical spine 04/07/2020 FINDINGS: CT HEAD FINDINGS BRAIN: BRAIN Cerebral ventricle sizes are concordant with the degree of cerebral volume loss. No evidence of large-territorial acute infarction. No parenchymal hemorrhage. No mass lesion. No extra-axial collection. Empty sella. No mass effect or midline shift. No hydrocephalus. Basilar cisterns are patent. Vascular: No hyperdense vessel. Atherosclerotic calcifications are present within the cavernous internal carotid arteries. Skull: No acute fracture or focal lesion. Other: None. CT MAXILLOFACIAL FINDINGS Osseous: Interval development of a displaced right lamina papyracea fracture. No destructive  process. Bilateral temporomandibular joint degenerative changes. Sinuses/Orbits: Associated mild mucosal thickening of the right ethmoid sinus. Paranasal sinuses and mastoid air cells are clear. Similar calcification of the right orbit (2:25). Orbits are unremarkable. Soft tissues: Mild right periorbital subcutaneus soft tissue edema/hematoma formation. CT CERVICAL SPINE FINDINGS Alignment: Normal. Skull base and vertebrae: Multilevel severe degenerative changes of the spine. Associated moderate to severe multilevel osseous neural foraminal stenosis. No severe osseous central canal stenosis. No acute fracture. No aggressive appearing focal osseous lesion or focal pathologic  process. Soft tissues and spinal canal: No prevertebral fluid or swelling. No visible canal hematoma. Upper chest: Unremarkable. Other: None. IMPRESSION: 1. No acute intracranial abnormality. 2. Interval development of a displaced right lamina papyracea fracture. 3. No acute displaced fracture or traumatic listhesis of the cervical spine. 4. Empty sella. Findings is often a normal anatomic variant but can be associated with idiopathic intracranial hypertension (pseudotumor cerebri). Electronically Signed   By: Tish Frederickson M.D.   On: 07/28/2021 19:27   CT Cervical Spine Wo Contrast  Result Date: 07/28/2021 CLINICAL DATA:  Facial trauma. Mechanical fall forward onto sidewalk. No LOC. EXAM: CT HEAD WITHOUT CONTRAST CT MAXILLOFACIAL WITHOUT CONTRAST CT CERVICAL SPINE WITHOUT CONTRAST TECHNIQUE: Multidetector CT imaging of the head, cervical spine, and maxillofacial structures were performed using the standard protocol without intravenous contrast. Multiplanar CT image reconstructions of the cervical spine and maxillofacial structures were also generated. COMPARISON:  CT head and cervical spine 04/07/2020 FINDINGS: CT HEAD FINDINGS BRAIN: BRAIN Cerebral ventricle sizes are concordant with the degree of cerebral volume loss. No evidence of  large-territorial acute infarction. No parenchymal hemorrhage. No mass lesion. No extra-axial collection. Empty sella. No mass effect or midline shift. No hydrocephalus. Basilar cisterns are patent. Vascular: No hyperdense vessel. Atherosclerotic calcifications are present within the cavernous internal carotid arteries. Skull: No acute fracture or focal lesion. Other: None. CT MAXILLOFACIAL FINDINGS Osseous: Interval development of a displaced right lamina papyracea fracture. No destructive process. Bilateral temporomandibular joint degenerative changes. Sinuses/Orbits: Associated mild mucosal thickening of the right ethmoid sinus. Paranasal sinuses and mastoid air cells are clear. Similar calcification of the right orbit (2:25). Orbits are unremarkable. Soft tissues: Mild right periorbital subcutaneus soft tissue edema/hematoma formation. CT CERVICAL SPINE FINDINGS Alignment: Normal. Skull base and vertebrae: Multilevel severe degenerative changes of the spine. Associated moderate to severe multilevel osseous neural foraminal stenosis. No severe osseous central canal stenosis. No acute fracture. No aggressive appearing focal osseous lesion or focal pathologic process. Soft tissues and spinal canal: No prevertebral fluid or swelling. No visible canal hematoma. Upper chest: Unremarkable. Other: None. IMPRESSION: 1. No acute intracranial abnormality. 2. Interval development of a displaced right lamina papyracea fracture. 3. No acute displaced fracture or traumatic listhesis of the cervical spine. 4. Empty sella. Findings is often a normal anatomic variant but can be associated with idiopathic intracranial hypertension (pseudotumor cerebri). Electronically Signed   By: Tish Frederickson M.D.   On: 07/28/2021 19:27   CT Maxillofacial Wo Contrast  Result Date: 07/28/2021 CLINICAL DATA:  Facial trauma. Mechanical fall forward onto sidewalk. No LOC. EXAM: CT HEAD WITHOUT CONTRAST CT MAXILLOFACIAL WITHOUT CONTRAST CT  CERVICAL SPINE WITHOUT CONTRAST TECHNIQUE: Multidetector CT imaging of the head, cervical spine, and maxillofacial structures were performed using the standard protocol without intravenous contrast. Multiplanar CT image reconstructions of the cervical spine and maxillofacial structures were also generated. COMPARISON:  CT head and cervical spine 04/07/2020 FINDINGS: CT HEAD FINDINGS BRAIN: BRAIN Cerebral ventricle sizes are concordant with the degree of cerebral volume loss. No evidence of large-territorial acute infarction. No parenchymal hemorrhage. No mass lesion. No extra-axial collection. Empty sella. No mass effect or midline shift. No hydrocephalus. Basilar cisterns are patent. Vascular: No hyperdense vessel. Atherosclerotic calcifications are present within the cavernous internal carotid arteries. Skull: No acute fracture or focal lesion. Other: None. CT MAXILLOFACIAL FINDINGS Osseous: Interval development of a displaced right lamina papyracea fracture. No destructive process. Bilateral temporomandibular joint degenerative changes. Sinuses/Orbits: Associated mild mucosal thickening of the right ethmoid sinus. Paranasal  sinuses and mastoid air cells are clear. Similar calcification of the right orbit (2:25). Orbits are unremarkable. Soft tissues: Mild right periorbital subcutaneus soft tissue edema/hematoma formation. CT CERVICAL SPINE FINDINGS Alignment: Normal. Skull base and vertebrae: Multilevel severe degenerative changes of the spine. Associated moderate to severe multilevel osseous neural foraminal stenosis. No severe osseous central canal stenosis. No acute fracture. No aggressive appearing focal osseous lesion or focal pathologic process. Soft tissues and spinal canal: No prevertebral fluid or swelling. No visible canal hematoma. Upper chest: Unremarkable. Other: None. IMPRESSION: 1. No acute intracranial abnormality. 2. Interval development of a displaced right lamina papyracea fracture. 3. No acute  displaced fracture or traumatic listhesis of the cervical spine. 4. Empty sella. Findings is often a normal anatomic variant but can be associated with idiopathic intracranial hypertension (pseudotumor cerebri). Electronically Signed   By: Tish Frederickson M.D.   On: 07/28/2021 19:27    ____________________________________________    PROCEDURES  Procedure(s) performed:     Marland KitchenMarland KitchenLaceration Repair  Date/Time: 07/28/2021 9:58 PM Performed by: Orvil Feil, PA-C Authorized by: Orvil Feil, PA-C   Consent:    Consent obtained:  Verbal   Risks discussed:  Infection and pain Universal protocol:    Procedure explained and questions answered to patient or proxy's satisfaction: yes     Patient identity confirmed:  Verbally with patient Anesthesia:    Anesthesia method:  None Laceration details:    Location:  Shoulder/arm   Shoulder/arm location:  R lower arm   Length (cm):  2 Pre-procedure details:    Preparation:  Patient was prepped and draped in usual sterile fashion Treatment:    Area cleansed with:  Saline   Debridement:  None Skin repair:    Repair method:  Tissue adhesive Approximation:    Approximation:  Loose     Medications - No data to display   ____________________________________________   INITIAL IMPRESSION / ASSESSMENT AND PLAN / ED COURSE  Pertinent labs & imaging results that were available during my care of the patient were reviewed by me and considered in my medical decision making (see chart for details).      Assessment and plan Fall 86 year old female presents to the emergency department after mechanical fall.  CTs of the head, face and cervical spine showed no evidence of intracranial bleed, skull fracture, facial fracture or C-spine fracture.  X-rays of the bilateral knee show no acute bony abnormality.  Patient's blood pressure was notably elevated at 194/70 at triage and 217/76 upon recheck.  Patient reported that her blood pressure had  been elevated at home and she has not had a chance to follow-up with her PCP.  I did give patient 0.1 mg of Catapres in the emergency department and her blood pressure trended down.  I recommended that patient make a follow-up appointment with her PCP as soon as possible to adjust her blood pressure medications.  Patient lives with her granddaughter at home and has caretaker in the room, niece.   ____________________________________________  FINAL CLINICAL IMPRESSION(S) / ED DIAGNOSES  Final diagnoses:  Fall, initial encounter  Skin tear of right forearm without complication, initial encounter      NEW MEDICATIONS STARTED DURING THIS VISIT:  ED Discharge Orders     None           This chart was dictated using voice recognition software/Dragon. Despite best efforts to proofread, errors can occur which can change the meaning. Any change was purely unintentional.     Joseph Art,  Dayna Barker, PA-C 07/28/21 2200    Merwyn Katos, MD 08/03/21 2100

## 2021-07-28 NOTE — Progress Notes (Signed)
Daily Session Note  Patient Details  Name: Andrea Phillips MRN: [id number] Date of Birth: [birth year] Referring Provider:   Flowsheet Row Cardiac Rehab from 05/18/2021 in [facility]  Referring Provider Serafina Royals MD       Encounter Date: 07/28/2021  Check In:  Session Check In - 07/28/21 1440       Check-In   Supervising physician immediately available to respond to emergencies See telemetry face sheet for immediately available ER MD    Location [facility]    Staff Present Birdie Sons, MPA, Nino Glow, MS, ASCM CEP, Exercise Physiologist;Melissa Caiola, RDN, LDN    Virtual Visit No    Medication changes reported     No    Fall or balance concerns reported    No    Tobacco Cessation No Change    Warm-up and Cool-down Not performed (comment)   came to deliver homework and discharge   Resistance Training Performed No    VAD Patient? No    PAD/SET Patient? No      Pain Assessment   Currently in Pain? No/denies                Social History   Tobacco Use  Smoking Status Never  Smokeless Tobacco Never    Goals Met:  Pt came to submit post program homework and receive discharge paperwork.   Goals Unmet:  Not Applicable  Comments:  Ercie graduated today from  rehab with 33 sessions completed.  Details of the patient's exercise prescription and what She needs to do in order to continue the prescription and progress were discussed with patient.  Patient was given a copy of prescription and goals.  Patient verbalized understanding.  Kiley plans to continue to exercise by walking and doing exercises learned while participating in rehab.    Dr. Emily Filbert is Medical Director for [place].  Dr. Ottie Glazier is Medical Director for [facility] Pulmonary Rehabilitation.

## 2021-07-28 NOTE — Progress Notes (Signed)
Cardiac Individual Treatment Plan  Patient Details  Name: Andrea Phillips MRN: [id number] Date of Birth: 15-May-1935 Referring Provider:   Flowsheet Row Cardiac Rehab from 05/18/2021 in [facility] Cardiac and Pulmonary Rehab  Referring Provider Serafina Royals MD       Initial Encounter Date:  Flowsheet Row Cardiac Rehab from 05/18/2021 in [facility] Cardiac and Pulmonary Rehab  Date 05/18/21       Visit Diagnosis: NSTEMI (non-ST elevated myocardial infarction) [facility])  Patient's Home Medications on Admission:  Current Outpatient Medications:    acetaminophen (TYLENOL) 325 MG tablet, Take 325-650 mg by mouth every 6 (six) hours as needed for moderate pain., Disp: , Rfl:    albuterol (VENTOLIN HFA) 108 (90 Base) MCG/ACT inhaler, Inhale 2 puffs into the lungs every 6 (six) hours as needed for wheezing or shortness of breath., Disp: , Rfl:    Cholecalciferol (VITAMIN D3) 1.25 MG (50000 UT) TABS, Take 1,000 Units by mouth once a week., Disp: , Rfl:    fluocinonide (LIDEX) 0.05 % external solution, Apply 1 application topically daily as needed (skin irritations)., Disp: , Rfl:    fluticasone (FLONASE) 50 MCG/ACT nasal spray, Place 1 spray into both nostrils 2 (two) times daily as needed for allergies or rhinitis., Disp: , Rfl:    furosemide (LASIX) 40 MG tablet, Take 1 tablet (40 mg total) by mouth daily. (Patient taking differently: Take 20 mg by mouth daily.), Disp: 90 tablet, Rfl: 3   guaiFENesin (MUCINEX) 600 MG 12 hr tablet, Take 1 tablet (600 mg total) by mouth 2 (two) times daily., Disp: , Rfl:    HYDROcodone-acetaminophen (NORCO/VICODIN) 5-325 MG tablet, Take 1-2 tablets by mouth every 6 (six) hours as needed for severe pain. (Patient not taking: Reported on 06/09/2021), Disp: 14 tablet, Rfl: 0   icosapent Ethyl (VASCEPA) 1 g capsule, Take 2 g by mouth 2 (two) times daily with a meal., Disp: , Rfl:    levothyroxine (SYNTHROID) 112 MCG tablet, Take 112 mcg by mouth daily., Disp: , Rfl:     Magnesium Oxide 250 MG TABS, Take 1-2 tablets by mouth daily as needed., Disp: , Rfl:    metoprolol succinate (TOPROL-XL) 50 MG 24 hr tablet, Take 1 tablet (50 mg total) by mouth daily. Take with or immediately following a meal. (Patient taking differently: Take 25 mg by mouth daily. Take with or immediately following a meal.), Disp: 90 tablet, Rfl: 1   mometasone-formoterol (DULERA) 200-5 MCG/ACT AERO, Inhale 2 puffs into the lungs 2 (two) times daily., Disp: 1 each, Rfl: 1   omeprazole (PRILOSEC OTC) 20 MG tablet, Take 2 tablets (40 mg total) by mouth daily., Disp: 5 tablet, Rfl: 0   telmisartan (MICARDIS) 40 MG tablet, Take 40 mg by mouth daily., Disp: , Rfl:   Past Medical History: Past Medical History:  Diagnosis Date   Arthritis    ra   Asthma    CHF (congestive heart failure) (HCC)    Dysrhythmia    Edema extremities    GERD (gastroesophageal reflux disease)    Gout    Headache    History of hiatal hernia    Hypertension    Hypothyroidism    Shortness of breath dyspnea    Sleep apnea     Tobacco Use: Social History   Tobacco Use  Smoking Status Never  Smokeless Tobacco Never    Labs: Recent Review Flowsheet Data     Labs for ITP Cardiac and Pulmonary Rehab Latest Ref Rng & Units 04/02/2021  Cholestrol 0 - 200 mg/dL 170   LDLCALC 0 - 99 mg/dL 112(H)   HDL >40 mg/dL 46   Trlycerides <150 mg/dL 60        Exercise Target Goals: Exercise Program Goal: Individual exercise prescription set using results from initial 6 min walk test and THRR while considering  patient's activity barriers and safety.   Exercise Prescription Goal: Initial exercise prescription builds to 30-45 minutes a day of aerobic activity, 2-3 days per week.  Home exercise guidelines will be given to patient during program as part of exercise prescription that the participant will acknowledge.   Education: Aerobic Exercise: - Group verbal and visual presentation on the components of exercise  prescription. Introduces F.I.T.T principle from ACSM for exercise prescriptions.  Reviews F.I.T.T. principles of aerobic exercise including progression. Written material given at graduation. Flowsheet Row Cardiac Rehab from 07/21/2021 in [facility] Cardiac and Pulmonary Rehab  Date 06/16/21  Educator AS  Instruction Review Code 1- Verbalizes Understanding       Education: Resistance Exercise: - Group verbal and visual presentation on the components of exercise prescription. Introduces F.I.T.T principle from ACSM for exercise prescriptions  Reviews F.I.T.T. principles of resistance exercise including progression. Written material given at graduation. Flowsheet Row Cardiac Rehab from 07/21/2021 in [facility] Cardiac and Pulmonary Rehab  Date 06/23/21  Educator AS  Instruction Review Code 1- Verbalizes Understanding        Education: Exercise & Equipment Safety: - Individual verbal instruction and demonstration of equipment use and safety with use of the equipment. Flowsheet Row Cardiac Rehab from 07/21/2021 in [facility] Cardiac and Pulmonary Rehab  Education need identified 05/18/21  Date 05/18/21  Educator [place]  Instruction Review Code 1- Verbalizes Understanding       Education: Exercise Physiology & General Exercise Guidelines: - Group verbal and written instruction with models to review the exercise physiology of the cardiovascular system and associated critical values. Provides general exercise guidelines with specific guidelines to those with heart or lung disease.  Flowsheet Row Cardiac Rehab from 07/21/2021 in [facility] Cardiac and Pulmonary Rehab  Date 06/09/21  Educator AS  Instruction Review Code 1- Verbalizes Understanding       Education: Flexibility, Balance, Mind/Body Relaxation: - Group verbal and visual presentation with interactive activity on the components of exercise prescription. Introduces F.I.T.T principle from ACSM for exercise prescriptions. Reviews F.I.T.T. principles of  flexibility and balance exercise training including progression. Also discusses the mind body connection.  Reviews various relaxation techniques to help reduce and manage stress (i.e. Deep breathing, progressive muscle relaxation, and visualization). Balance handout provided to take home. Written material given at graduation. Flowsheet Row Cardiac Rehab from 07/21/2021 in [facility] Cardiac and Pulmonary Rehab  Date 06/30/21  Educator AS  Instruction Review Code 1- Verbalizes Understanding       Activity Barriers & Risk Stratification:  Activity Barriers & Cardiac Risk Stratification - 05/18/21 1152       Activity Barriers & Cardiac Risk Stratification   Activity Barriers Left Knee Replacement;Deconditioning;Balance Concerns;Muscular Weakness    Cardiac Risk Stratification Moderate             6 Minute Walk:  6 Minute Walk     Row Name 05/18/21 1155 07/21/21 1428       6 Minute Walk   Phase Initial --    Distance 1000 feet 1260 feet    Distance % Change -- 26 %    Distance Feet Change -- 260 ft    Walk Time 6 minutes  6 minutes    # of Rest Breaks 0 0    MPH 1.89 2.38    METS 1.73 2.83    RPE 11 13    Perceived Dyspnea  1 1    VO2 Peak 6.06 9.9    Symptoms Yes (comment) No    Comments Fatigued, slight dizziness- resolved with rest --    Resting HR 62 bpm 74 bpm    Resting BP 140/74 156/76    Resting Oxygen Saturation  96 % 98 %    Exercise Oxygen Saturation  during 6 min walk 96 % 90 %    Max Ex. HR 99 bpm 121 bpm    Max Ex. BP 182/70  RCK 150/72 220/76    2 Minute Post BP 140/72 184/84             Oxygen Initial Assessment:   Oxygen Re-Evaluation:   Oxygen Discharge (Final Oxygen Re-Evaluation):   Initial Exercise Prescription:  Initial Exercise Prescription - 05/18/21 1500       Date of Initial Exercise RX and Referring Provider   Date 05/18/21    Referring Provider Serafina Royals MD      NuStep   Level 1    SPM 80    Minutes 15    METs 1.7       Arm Ergometer   Level 1    RPM 30    Minutes 15    METs 1.7      Biostep-RELP   Level 1    SPM 50    Minutes 15    METs 1.7      Track   Laps 16    Minutes 15    METs 1.87      Prescription Details   Frequency (times per week) 3    Duration Progress to 30 minutes of continuous aerobic without signs/symptoms of physical distress      Intensity   THRR 40-80% of Max Heartrate 91-121    Ratings of Perceived Exertion 11-13    Perceived Dyspnea 0-4      Progression   Progression Continue to progress workloads to maintain intensity without signs/symptoms of physical distress.      Resistance Training   Training Prescription Yes    Weight 3 lb    Reps 10-15             Perform Capillary Blood Glucose checks as needed.  Exercise Prescription Changes:   Exercise Prescription Changes     Row Name 05/18/21 1500 05/31/21 1300 06/03/21 1400 06/14/21 1000 06/29/21 1300     Response to Exercise   Blood Pressure (Admit) 140/74 122/62 -- 138/60 148/68   Blood Pressure (Exercise) 182/70 162/70 -- 162/68 --   Blood Pressure (Exit) 140/72 122/64 -- 148/60 142/64   Heart Rate (Admit) 62 bpm 81 bpm -- 70 bpm 78 bpm   Heart Rate (Exercise) 99 bpm 113 bpm -- 101 bpm 119 bpm   Heart Rate (Exit) 66 bpm 79 bpm -- 65 bpm 87 bpm   Oxygen Saturation (Admit) 96 % -- -- -- --   Oxygen Saturation (Exercise) 96 % -- -- -- --   Oxygen Saturation (Exit) 96 % -- -- -- --   Rating of Perceived Exertion (Exercise) 11 14 -- 15 15   Perceived Dyspnea (Exercise) 1 -- -- -- --   Symptoms fatigue, slight dizziness -- -- none none   Comments walk test results third day exercise -- -- --   Duration --  Progress to 30 minutes of  aerobic without signs/symptoms of physical distress -- Progress to 30 minutes of  aerobic without signs/symptoms of physical distress Progress to 30 minutes of  aerobic without signs/symptoms of physical distress   Intensity -- THRR unchanged -- THRR unchanged THRR  unchanged     Progression   Progression -- Continue to progress workloads to maintain intensity without signs/symptoms of physical distress. -- Continue to progress workloads to maintain intensity without signs/symptoms of physical distress. Continue to progress workloads to maintain intensity without signs/symptoms of physical distress.   Average METs -- 2.2 -- 2.51 2.3     Resistance Training   Training Prescription -- Yes -- Yes Yes   Weight -- 3 lb -- 3 lb 3 lb   Reps -- 10-15 -- 10-15 10-15     NuStep   Level -- 1 -- -- --   SPM -- 80 -- -- --   Minutes -- 15 -- -- --   METs -- 2 -- -- --     Arm Ergometer   Level -- -- -- 1 --   Minutes -- -- -- 15 --   METs -- -- -- 2 --     Biostep-RELP   Level -- -- -- 2 2   Minutes -- -- -- 15 15   METs -- -- -- 3 2     Track   Laps -- 26 -- 30 30   Minutes -- 15 -- 15 15   METs -- 2.4 -- 2.63 2.63     Home Exercise Plan   Plans to continue exercise at -- -- Home (comment)  walking, staff videos Home (comment)  walking, staff videos Home (comment)  walking, staff videos   Frequency -- -- Add 2 additional days to program exercise sessions. Add 2 additional days to program exercise sessions. Add 2 additional days to program exercise sessions.   Initial Home Exercises Provided -- -- 06/03/21 06/03/21 06/03/21    Row Name 07/12/21 1300 07/26/21 0800           Response to Exercise   Blood Pressure (Admit) 162/74 132/70      Blood Pressure (Exit) 108/64 126/64      Heart Rate (Admit) 73 bpm 69 bpm      Heart Rate (Exercise) 115 bpm 111 bpm      Heart Rate (Exit) 70 bpm 79 bpm      Rating of Perceived Exertion (Exercise) 13 12      Symptoms none none      Duration Continue with 30 min of aerobic exercise without signs/symptoms of physical distress. Continue with 30 min of aerobic exercise without signs/symptoms of physical distress.      Intensity THRR unchanged THRR unchanged             Progression   Progression Continue to  progress workloads to maintain intensity without signs/symptoms of physical distress. Continue to progress workloads to maintain intensity without signs/symptoms of physical distress.      Average METs 2.5 2             Resistance Training   Training Prescription Yes Yes      Weight 3 lb 3 lb      Reps 10-15 10-15             Interval Training   Interval Training No No             NuStep   Level 3 3  Minutes 15 15      METs 2.4 1.9             Arm Ergometer   Level 1 --      Minutes 15 --      METs 2.1 --             Track   Laps 37 30      Minutes 15 1      METs 3.01 2.63             Home Exercise Plan   Plans to continue exercise at Home (comment)  walking, staff videos Home (comment)  walking, staff videos      Frequency Add 2 additional days to program exercise sessions. Add 2 additional days to program exercise sessions.      Initial Home Exercises Provided 06/03/21 06/03/21               Exercise Comments:   Exercise Comments     Row Name 05/24/21 1414 07/26/21 1429         Exercise Comments First full day of exercise!  Patient was oriented to gym and equipment including functions, settings, policies, and procedures.  Patient's individual exercise prescription and treatment plan were reviewed.  All starting workloads were established based on the results of the 6 minute walk test done at initial orientation visit.  The plan for exercise progression was also introduced and progression will be customized based on patient's performance and goals. Patient's BP is elevated at arrival/at rest. Patient is asymptomatic and instructed to exercise on the seated machine with a light load today. BP continued to be elevated and she was stopped from exercising. Patient was educated about taking medication as ordered and patient stated understanding. Jettie Booze, PA, in cardiology office contacted. PA replied that patient should take $RemoveBef'80mg'[id number]$  telmisartan once daily and not  split dose, and that patient has a return appointment scheduled for November. Patient completed paperwork today to prepare for discharge at next visit. Patient continues to be asymptomatic and left to go home.               Exercise Goals and Review:   Exercise Goals     Row Name 05/18/21 1523             Exercise Goals   Increase Physical Activity Yes       Intervention Provide advice, education, support and counseling about physical activity/exercise needs.;Develop an individualized exercise prescription for aerobic and resistive training based on initial evaluation findings, risk stratification, comorbidities and participant's personal goals.       Expected Outcomes Short Term: Attend rehab on a regular basis to increase amount of physical activity.;Long Term: Add in home exercise to make exercise part of routine and to increase amount of physical activity.;Long Term: Exercising regularly at least 3-5 days a week.       Increase Strength and Stamina Yes       Intervention Provide advice, education, support and counseling about physical activity/exercise needs.;Develop an individualized exercise prescription for aerobic and resistive training based on initial evaluation findings, risk stratification, comorbidities and participant's personal goals.       Expected Outcomes Short Term: Increase workloads from initial exercise prescription for resistance, speed, and METs.;Short Term: Perform resistance training exercises routinely during rehab and add in resistance training at home;Long Term: Improve cardiorespiratory fitness, muscular endurance and strength as measured by increased METs and functional capacity (6MWT)  Able to understand and use rate of perceived exertion (RPE) scale Yes       Intervention Provide education and explanation on how to use RPE scale       Expected Outcomes Short Term: Able to use RPE daily in rehab to express subjective intensity level;Long Term:  Able to  use RPE to guide intensity level when exercising independently       Able to understand and use Dyspnea scale Yes       Intervention Provide education and explanation on how to use Dyspnea scale       Expected Outcomes Short Term: Able to use Dyspnea scale daily in rehab to express subjective sense of shortness of breath during exertion;Long Term: Able to use Dyspnea scale to guide intensity level when exercising independently       Knowledge and understanding of Target Heart Rate Range (THRR) Yes       Intervention Provide education and explanation of THRR including how the numbers were predicted and where they are located for reference       Expected Outcomes Short Term: Able to state/look up THRR;Short Term: Able to use daily as guideline for intensity in rehab;Long Term: Able to use THRR to govern intensity when exercising independently       Able to check pulse independently Yes       Intervention Review the importance of being able to check your own pulse for safety during independent exercise;Provide education and demonstration on how to check pulse in carotid and radial arteries.       Expected Outcomes Short Term: Able to explain why pulse checking is important during independent exercise;Long Term: Able to check pulse independently and accurately       Understanding of Exercise Prescription Yes       Intervention Provide education, explanation, and written materials on patient's individual exercise prescription       Expected Outcomes Short Term: Able to explain program exercise prescription;Long Term: Able to explain home exercise prescription to exercise independently                Exercise Goals Re-Evaluation :  Exercise Goals Re-Evaluation     Row Name 05/24/21 1414 05/31/21 1307 06/03/21 1439 06/14/21 1031 06/17/21 1405     Exercise Goal Re-Evaluation   Exercise Goals Review Increase Physical Activity;Able to understand and use rate of perceived exertion (RPE)  scale;Knowledge and understanding of Target Heart Rate Range (THRR);Understanding of Exercise Prescription;Increase Strength and Stamina;Able to understand and use Dyspnea scale;Able to check pulse independently Increase Physical Activity;Increase Strength and Stamina Increase Physical Activity;Increase Strength and Stamina;Able to understand and use rate of perceived exertion (RPE) scale;Able to understand and use Dyspnea scale;Knowledge and understanding of Target Heart Rate Range (THRR);Able to check pulse independently;Understanding of Exercise Prescription Increase Physical Activity;Increase Strength and Stamina Increase Physical Activity;Increase Strength and Stamina;Understanding of Exercise Prescription   Comments Reviewed RPE and dyspnea scales, THR and program prescription with pt today.  Pt voiced understanding and was given a copy of goals to take home. Andrea Phillips has tolerated exercise well iin her first sessions.  She works in [employer] range.Staff will monitor progress. Reviewed home exercise with pt today.  Pt plans to walk at home and use staff videos for exercise.  Reviewed THR, pulse, RPE, sign and symptoms, pulse oximetery and when to call 911 or MD.  Also discussed weather considerations and indoor options.  Pt voiced understanding. Andrea Phillips continues to do well in rehab.  She is  tolerating the arm crank well and has increased her number of laps on the track to 30. Will continue to monitor. Andrea Phillips is doing well in rehab. She is doing some exercise on her off days by walking. She is also doing stretches and PT exercises each day. She is feeling stronger and better overall.   Expected Outcomes Short: Use RPE daily to regulate intensity. Long: Follow program prescription in THR. Short: attend consistently Long: improve overall stamina Short: Start to add in walking at home Long: Continue to improve stamina. Short: Continue to increase laps on the track Long: Continue to increase overall MET level Short:  Continue to add in exercise at home Long: COnitnue to improve stamina.    Row Name 06/29/21 1347 07/12/21 1323 07/21/21 1428 07/26/21 0832       Exercise Goal Re-Evaluation   Exercise Goals Review Increase Physical Activity;Increase Strength and Stamina Increase Physical Activity;Increase Strength and Stamina;Understanding of Exercise Prescription Increase Physical Activity;Increase Strength and Stamina;Understanding of Exercise Prescription Increase Physical Activity;Increase Strength and Stamina    Comments Andrea Phillips attends consistently and reaches THR range.  She has increased to 4 lb for strength work.  Staff will monitor progress. Annaleigha is doing well in rehab.  She is now up to 37 laps on the track and level 3 on the NuStep.  We will continue to monitor her progress. Andrea Phillips is doing well in rehab. She does some resistance training at home with the help of a counter. she is not able to push herself due to her increased BP. Andrea Phillips improved post by 26% and 260 feet!  We will continue to monitor progress.    Expected Outcomes Short:  continue to attend consistently Long:  build overall stamina Short: Continue to try to increase laps on track Long: Continue to improve stamina Short: Continue to try to increase laps on track Long: Continue to improve stamina Short: complete LW Long:maintain exercise on her own             Discharge Exercise Prescription (Final Exercise Prescription Changes):  Exercise Prescription Changes - 07/26/21 0800       Response to Exercise   Blood Pressure (Admit) 132/70    Blood Pressure (Exit) 126/64    Heart Rate (Admit) 69 bpm    Heart Rate (Exercise) 111 bpm    Heart Rate (Exit) 79 bpm    Rating of Perceived Exertion (Exercise) 12    Symptoms none    Duration Continue with 30 min of aerobic exercise without signs/symptoms of physical distress.    Intensity THRR unchanged      Progression   Progression Continue to progress workloads to maintain  intensity without signs/symptoms of physical distress.    Average METs 2      Resistance Training   Training Prescription Yes    Weight 3 lb    Reps 10-15      Interval Training   Interval Training No      NuStep   Level 3    Minutes 15    METs 1.9      Track   Laps 30    Minutes 1    METs 2.63      Home Exercise Plan   Plans to continue exercise at Home (comment)   walking, staff videos   Frequency Add 2 additional days to program exercise sessions.    Initial Home Exercises Provided 06/03/21             Nutrition:  Target Goals: Understanding of nutrition guidelines, daily intake of sodium '1500mg'$ , cholesterol '200mg'$ , calories 30% from fat and 7% or less from saturated fats, daily to have 5 or more servings of fruits and vegetables.  Education: All About Nutrition: -Group instruction provided by verbal, written material, interactive activities, discussions, models, and posters to present general guidelines for heart healthy nutrition including fat, fiber, MyPlate, the role of sodium in heart healthy nutrition, utilization of the nutrition label, and utilization of this knowledge for meal planning. Follow up email sent as well. Written material given at graduation. Flowsheet Row Cardiac Rehab from 07/21/2021 in [facility] Cardiac and Pulmonary Rehab  Date 07/14/21  Educator [facility]  Instruction Review Code 1- Verbalizes Understanding       Biometrics:  Pre Biometrics - 05/18/21 1151       Pre Biometrics   Height 5' 1.5" (1.562 m)    Weight 162 lb 14.4 oz (73.9 kg)    BMI (Calculated) 30.29    Single Leg Stand 1.18 seconds              Nutrition Therapy Plan and Nutrition Goals:  Nutrition Therapy & Goals - 06/07/21 1331       Nutrition Therapy   Diet Heart healthy, low Na    Protein (specify units) 60g    Fiber 25 grams    Whole Grain Foods 3 servings    Saturated Fats 12 max. grams    Fruits and Vegetables 8 servings/day    Sodium 1.5 grams       Personal Nutrition Goals   Nutrition Goal ST: try mixing white and brown rice, eat a rainbow of fruits and vegetables in a weeks time, add nuts and fruit to AM oatmeal LT: LDL cholesterol and triglycerides in normal limits.    Comments Medications first thing in the morning. B: oatmeal or scrambled egg and piece of toast and sometimes bacon L: sandwich D: meat (chicken, hamburger, pork chops) and two vegetables (most all vegetables). She uses wheat bread, she does not cook with salt, olive and canola oil and some fatback on certain items. Drinks: water, coffee (black), unsweet tea. Discussed heart healthy eating and how to lower blood LDL cholesterol and triglycerides.      Intervention Plan   Intervention Prescribe, educate and counsel regarding individualized specific dietary modifications aiming towards targeted core components such as weight, hypertension, lipid management, diabetes, heart failure and other comorbidities.;Nutrition handout(s) given to patient.    Expected Outcomes Short Term Goal: Understand basic principles of dietary content, such as calories, fat, sodium, cholesterol and nutrients.;Short Term Goal: A plan has been developed with personal nutrition goals set during dietitian appointment.;Long Term Goal: Adherence to prescribed nutrition plan.             Nutrition Assessments:  MEDIFICTS Score Key: ?70 Need to make dietary changes  40-70 Heart Healthy Diet ? 40 Therapeutic Level Cholesterol Diet  Flowsheet Row Cardiac Rehab from 05/18/2021 in [facility] Cardiac and Pulmonary Rehab  Picture Your Plate Total Score on Admission 54      Picture Your Plate Scores: <00 Unhealthy dietary pattern with much room for improvement. 41-50 Dietary pattern unlikely to meet recommendations for good health and room for improvement. 51-60 More healthful dietary pattern, with some room for improvement.  >60 Healthy dietary pattern, although there may be some specific behaviors that could  be improved.    Nutrition Goals Re-Evaluation:  Nutrition Goals Re-Evaluation     [place] Name 06/17/21 1410 07/21/21 1429  Goals   Nutrition Goal ST: try mixing white and brown rice, eat a rainbow of fruits and vegetables in a weeks time, add nuts and fruit to AM oatmeal LT: LDL cholesterol and triglycerides in normal limits. ST: try mixing white and brown rice, eat a rainbow of fruits and vegetables in a weeks time, add nuts and fruit to AM oatmeal LT: LDL cholesterol and triglycerides in normal limits.      Comment Andrea Phillips is doing well in rehab.  However, she is concerned that her weight is creeping up. She is trying to eat more healthy options.  We talked about whether she is eating more now that she is exercising more.  She continue to work on her diet She eats fruit, green and yellow vegetbales. She limits fatty meat. She uses canola oil to cook. She does not use salt with cooking. She has not tried out the short term goals we made, but would like to do so.      Expected Outcome Short: Make sure she is getting enough protein Long: Continue to eat heart healthy ST: try mixing white and brown rice, eat a rainbow of fruits and vegetables in a weeks time, add nuts and fruit to AM oatmeal LT: LDL cholesterol and triglycerides in normal limits.               Nutrition Goals Discharge (Final Nutrition Goals Re-Evaluation):  Nutrition Goals Re-Evaluation - 07/21/21 1429       Goals   Nutrition Goal ST: try mixing white and brown rice, eat a rainbow of fruits and vegetables in a weeks time, add nuts and fruit to AM oatmeal LT: LDL cholesterol and triglycerides in normal limits.    Comment She eats fruit, green and yellow vegetbales. She limits fatty meat. She uses canola oil to cook. She does not use salt with cooking. She has not tried out the short term goals we made, but would like to do so.    Expected Outcome ST: try mixing white and brown rice, eat a rainbow of fruits and  vegetables in a weeks time, add nuts and fruit to AM oatmeal LT: LDL cholesterol and triglycerides in normal limits.             Psychosocial: Target Goals: Acknowledge presence or absence of significant depression and/or stress, maximize coping skills, provide positive support system. Participant is able to verbalize types and ability to use techniques and skills needed for reducing stress and depression.   Education: Stress, Anxiety, and Depression - Group verbal and visual presentation to define topics covered.  Reviews how body is impacted by stress, anxiety, and depression.  Also discusses healthy ways to reduce stress and to treat/manage anxiety and depression.  Written material given at graduation. Flowsheet Row Cardiac Rehab from 07/21/2021 in [facility] Cardiac and Pulmonary Rehab  Date 06/02/21  Educator AS  Instruction Review Code 1- Verbalizes Understanding       Education: Sleep Hygiene -Provides group verbal and written instruction about how sleep can affect your health.  Define sleep hygiene, discuss sleep cycles and impact of sleep habits. Review good sleep hygiene tips.    Initial Review & Psychosocial Screening:  Initial Psych Review & Screening - 05/07/21 1411       Initial Review   Current issues with None Identified      Family Dynamics   Good Support System? Yes   grandchildren     Barriers   Psychosocial barriers to participate in program There  are no identifiable barriers or psychosocial needs.;The patient should benefit from training in stress management and relaxation.      Screening Interventions   Interventions Encouraged to exercise;Provide feedback about the scores to participant;To provide support and resources with identified psychosocial needs    Expected Outcomes Short Term goal: Utilizing psychosocial counselor, staff and physician to assist with identification of specific Stressors or current issues interfering with healing process. Setting desired  goal for each stressor or current issue identified.;Long Term Goal: Stressors or current issues are controlled or eliminated.;Long Term goal: The participant improves quality of Life and PHQ9 Scores as seen by post scores and/or verbalization of changes;Short Term goal: Identification and review with participant of any Quality of Life or Depression concerns found by scoring the questionnaire.             Quality of Life Scores:   Quality of Life - 05/18/21 1520       Quality of Life   Select Quality of Life      Quality of Life Scores   Health/Function Pre 20 %    Socioeconomic Pre 24.1 %    Psych/Spiritual Pre 25 %    Family Pre 24 %    GLOBAL Pre 22.31 %            Scores of 19 and below usually indicate a poorer quality of life in these areas.  A difference of  2-3 points is a clinically meaningful difference.  A difference of 2-3 points in the total score of the Quality of Life Index has been associated with significant improvement in overall quality of life, self-image, physical symptoms, and general health in studies assessing change in quality of life.  PHQ-9: Recent Review Flowsheet Data     Depression screen [facility] 2/9 05/18/2021   Decreased Interest 1   Down, Depressed, Hopeless 1   PHQ - 2 Score 2   Altered sleeping 0   Tired, decreased energy 1   Change in appetite 0   Feeling bad or failure about yourself  0   Trouble concentrating 0   Moving slowly or fidgety/restless 0   Suicidal thoughts 0   PHQ-9 Score 3   Difficult doing work/chores Not difficult at all      Interpretation of Total Score  Total Score Depression Severity:  1-4 = Minimal depression, 5-9 = Mild depression, 10-14 = Moderate depression, 15-19 = Moderately severe depression, 20-27 = Severe depression   Psychosocial Evaluation and Intervention:  Psychosocial Evaluation - 05/07/21 1413       Psychosocial Evaluation & Interventions   Interventions Encouraged to exercise with the program  and follow exercise prescription    Comments Ms. Ottaway is coming to cardiac rehab post NSTEMI. She states she is feeling pretty good, she completed hh pt. Her granddaughter is very helpful, looking out for her and helping her with meals. She doesn't report any current stressors, just the "usual" and that she manages it well. She has had a history of balance concerns so she is glad that this program may help with that. She takes all her medicine like she is supposed to, weighs daily, checks her BP  regularly and wears her CPAP nightly, so she is ready to add cardiac rehab to her list of things to do to make herself feel better.    Expected Outcomes Short: attend cardiac rehab for education and exercise. Long: develop & maintian positive self care habits    Continue Psychosocial Services  Follow  up required by staff             Psychosocial Re-Evaluation:  Psychosocial Re-Evaluation     [place] Name 06/17/21 1408 07/21/21 1430           Psychosocial Re-Evaluation   Current issues with Current Stress Concerns Current Stress Concerns      Comments Andrea Phillips is doing well in rehab. She has three grandkids that live with her and school is getting ready to start so mornings will get a little more hectic.  She does her best to stay out of the way.  She has a new grandbaby turning three months this month.  She sleeps well most days. Andrea Phillips is doing well in rehab. She has a friend that she talks to about her stress. She has a surgery coming up and some things medically that she is not sure what it is that is giving her stress. It is not something she would like to talk about or discuss right now. She can't do most of the things that she likes to do due to her vision. She reports sleeping well.      Expected Outcomes Short: Adjust to new school schedule Long: Contiue to focus on positive Short: continue to use friend for support during this time Long: Contiue to focus on positive      Interventions  Encouraged to attend Cardiac Rehabilitation for the exercise Encouraged to attend Cardiac Rehabilitation for the exercise      Continue Psychosocial Services  Follow up required by staff Follow up required by staff             Initial Review   Source of Stress Concerns -- Chronic Illness               Psychosocial Discharge (Final Psychosocial Re-Evaluation):  Psychosocial Re-Evaluation - 07/21/21 1430       Psychosocial Re-Evaluation   Current issues with Current Stress Concerns    Comments Andrea Phillips is doing well in rehab. She has a friend that she talks to about her stress. She has a surgery coming up and some things medically that she is not sure what it is that is giving her stress. It is not something she would like to talk about or discuss right now. She can't do most of the things that she likes to do due to her vision. She reports sleeping well.    Expected Outcomes Short: continue to use friend for support during this time Long: Contiue to focus on positive    Interventions Encouraged to attend Cardiac Rehabilitation for the exercise    Continue Psychosocial Services  Follow up required by staff      Initial Review   Source of Stress Concerns Chronic Illness             Vocational Rehabilitation: Provide vocational rehab assistance to qualifying candidates.   Vocational Rehab Evaluation & Intervention:   Education: Education Goals: Education classes will be provided on a variety of topics geared toward better understanding of heart health and risk factor modification. Participant will state understanding/return demonstration of topics presented as noted by education test scores.  Learning Barriers/Preferences:  Learning Barriers/Preferences - 05/07/21 1411       Learning Barriers/Preferences   Learning Barriers None    Learning Preferences None             General Cardiac Education Topics:  AED/CPR: - Group verbal and written instruction with the use  of models to demonstrate the basic  use of the AED with the basic ABC's of resuscitation.   Anatomy and Cardiac Procedures: - Group verbal and visual presentation and models provide information about basic cardiac anatomy and function. Reviews the testing methods done to diagnose heart disease and the outcomes of the test results. Describes the treatment choices: Medical Management, Angioplasty, or Coronary Bypass Surgery for treating various heart conditions including Myocardial Infarction, Angina, Valve Disease, and Cardiac Arrhythmias.  Written material given at graduation. Flowsheet Row Cardiac Rehab from 07/21/2021 in [facility] Cardiac and Pulmonary Rehab  Date 06/23/21  Educator [facility]  Instruction Review Code 1- Verbalizes Understanding       Medication Safety: - Group verbal and visual instruction to review commonly prescribed medications for heart and lung disease. Reviews the medication, class of the drug, and side effects. Includes the steps to properly store meds and maintain the prescription regimen.  Written material given at graduation. Flowsheet Row Cardiac Rehab from 07/21/2021 in [facility] Cardiac and Pulmonary Rehab  Date 07/07/21  Educator [facility]  Instruction Review Code 1- Verbalizes Understanding       Intimacy: - Group verbal instruction through game format to discuss how heart and lung disease can affect sexual intimacy. Written material given at graduation.. Flowsheet Row Cardiac Rehab from 07/21/2021 in [facility] Cardiac and Pulmonary Rehab  Date 06/16/21  Educator AS  Instruction Review Code 1- Verbalizes Understanding       Know Your Numbers and Heart Failure: - Group verbal and visual instruction to discuss disease risk factors for cardiac and pulmonary disease and treatment options.  Reviews associated critical values for Overweight/Obesity, Hypertension, Cholesterol, and Diabetes.  Discusses basics of heart failure: signs/symptoms and treatments.  Introduces Heart Failure Zone  chart for action plan for heart failure.  Written material given at graduation. Flowsheet Row Cardiac Rehab from 07/21/2021 in [facility] Cardiac and Pulmonary Rehab  Date 07/21/21  Educator [facility]  Instruction Review Code 1- Verbalizes Understanding       Infection Prevention: - Provides verbal and written material to individual with discussion of infection control including proper hand washing and proper equipment cleaning during exercise session. Flowsheet Row Cardiac Rehab from 07/21/2021 in [facility] Cardiac and Pulmonary Rehab  Education need identified 05/18/21  Date 05/18/21  Educator [place]  Instruction Review Code 1- Verbalizes Understanding       Falls Prevention: - Provides verbal and written material to individual with discussion of falls prevention and safety. Flowsheet Row Cardiac Rehab from 07/21/2021 in [facility] Cardiac and Pulmonary Rehab  Education need identified 05/18/21  Date 05/18/21  Educator [place]  Instruction Review Code 1- Verbalizes Understanding       Other: -Provides group and verbal instruction on various topics (see comments)   Knowledge Questionnaire Score:  Knowledge Questionnaire Score - 05/18/21 1501       Knowledge Questionnaire Score   Pre Score 22/26: Angina, Exercise             Core Components/Risk Factors/Patient Goals at Admission:  Personal Goals and Risk Factors at Admission - 05/18/21 1524       Core Components/Risk Factors/Patient Goals on Admission    Weight Management Yes;Weight Loss    Intervention Weight Management: Develop a combined nutrition and exercise program designed to reach desired caloric intake, while maintaining appropriate intake of nutrient and fiber, sodium and fats, and appropriate energy expenditure required for the weight goal.;Weight Management: Provide education and appropriate resources to help participant work on and attain dietary goals.;Weight Management/Obesity: Establish reasonable short term and long  term weight  goals.;Obesity: Provide education and appropriate resources to help participant work on and attain dietary goals.    Admit Weight 162 lb (73.5 kg)    Goal Weight: Short Term 157 lb (71.2 kg)    Goal Weight: Long Term 152 lb (68.9 kg)    Expected Outcomes Short Term: Continue to assess and modify interventions until short term weight is achieved;Long Term: Adherence to nutrition and physical activity/exercise program aimed toward attainment of established weight goal;Weight Loss: Understanding of general recommendations for a balanced deficit meal plan, which promotes 1-2 lb weight loss per week and includes a negative energy balance of [phone] kcal/d;Understanding recommendations for meals to include 15-35% energy as protein, 25-35% energy from fat, 35-60% energy from carbohydrates, less than $RemoveB'200mg'UPHAQMba$  of dietary cholesterol, 20-35 gm of total fiber daily;Understanding of distribution of calorie intake throughout the day with the consumption of 4-5 meals/snacks    Hypertension Yes    Intervention Provide education on lifestyle modifcations including regular physical activity/exercise, weight management, moderate sodium restriction and increased consumption of fresh fruit, vegetables, and low fat dairy, alcohol moderation, and smoking cessation.;Monitor prescription use compliance.    Expected Outcomes Short Term: Continued assessment and intervention until BP is < 140/33mm HG in hypertensive participants. < 130/22mm HG in hypertensive participants with diabetes, heart failure or chronic kidney disease.;Long Term: Maintenance of blood pressure at goal levels.    Lipids Yes    Intervention Provide education and support for participant on nutrition & aerobic/resistive exercise along with prescribed medications to achieve LDL '70mg'$ , HDL >$Remo'40mg'mmyng$ .    Expected Outcomes Short Term: Participant states understanding of desired cholesterol values and is compliant with medications prescribed. Participant is following  exercise prescription and nutrition guidelines.;Long Term: Cholesterol controlled with medications as prescribed, with individualized exercise RX and with personalized nutrition plan. Value goals: LDL < $Rem'70mg'ZHsD$ , HDL > 40 mg.             Education:Diabetes - Individual verbal and written instruction to review signs/symptoms of diabetes, desired ranges of glucose level fasting, after meals and with exercise. Acknowledge that pre and post exercise glucose checks will be done for 3 sessions at entry of program.   Core Components/Risk Factors/Patient Goals Review:   Goals and Risk Factor Review     Row Name 06/17/21 1436 07/21/21 1422           Core Components/Risk Factors/Patient Goals Review   Personal Goals Review Weight Management/Obesity;Hypertension;Lipids Weight Management/Obesity;Hypertension;Lipids      Review Andrea Phillips is doing well in rehab.  Her weight has been creeping up and she really wants to try to lose!  We talked about watching her protein and limiting fats.  Her pressures have been good and she does check them at home. She is feeling good with taking her medications and feels well managed. Andrea Phillips has been doing well in rehab. She takes her BP on a regular basis - usually in the morning (she feels it is alwasy a bit high, but doesn't remeber where it usually sits). Today her incoming BP was 156/78. Her BP has been creeping up during rehab and she gave her doctor a printout of her BP, still waiting for a response. Her weight is stable. She is still taking her medication as directed with no issues.      Expected Outcomes Short: Work on weight loss.  Long: Continue to monitor risk factors. Short: Wait for response from MD about BP  Long: Continue to monitor risk factors.  Core Components/Risk Factors/Patient Goals at Discharge (Final Review):   Goals and Risk Factor Review - 07/21/21 1422       Core Components/Risk Factors/Patient Goals Review   Personal Goals  Review Weight Management/Obesity;Hypertension;Lipids    Review Andrea Phillips has been doing well in rehab. She takes her BP on a regular basis - usually in the morning (she feels it is alwasy a bit high, but doesn't remeber where it usually sits). Today her incoming BP was 156/78. Her BP has been creeping up during rehab and she gave her doctor a printout of her BP, still waiting for a response. Her weight is stable. She is still taking her medication as directed with no issues.    Expected Outcomes Short: Wait for response from MD about BP  Long: Continue to monitor risk factors.             ITP Comments:  ITP Comments     Row Name 05/07/21 1417 05/18/21 1142 05/24/21 1414 06/02/21 0745 06/07/21 1426   ITP Comments Initial telephone orientation completed. Diagnosis can be found in [facility] 6/9. EP orientation scheduled for Tuesday 7/25 at 10am Completed 6MWT and gym orientation. Initial ITP created and sent for review to Dr. Emily Filbert, Medical Director. First full day of exercise!  Patient was oriented to gym and equipment including functions, settings, policies, and procedures.  Patient's individual exercise prescription and treatment plan were reviewed.  All starting workloads were established based on the results of the 6 minute walk test done at initial orientation visit.  The plan for exercise progression was also introduced and progression will be customized based on patient's performance and goals. 30 Day review completed. Medical Director ITP review done, changes made as directed, and signed approval by Medical Director. Completed initial RD consultation    [place] Name 06/30/21 0644 07/26/21 1427 07/26/21 1507 07/28/21 0940     ITP Comments 30 Day review completed. Medical Director ITP review done, changes made as directed, and signed approval by Medical Director. Pt able to follow exercise prescription today without complaint. Patient's BP is elevated at arrival/at rest. Patient is asymptomatic and  instructed to exercise on the seated machine with a light load today. BP regularly assessed and monitored. Patient continues to be asymptomatic. Patient's BP is elevated at arrival/at rest. Patient is asymptomatic and instructed to exercise on the seated machine with a light load today. BP continued to be elevated and she was stopped from exercising. Patient was educated about taking medication as ordered and patient stated understanding. Jettie Booze, PA, in cardiology office contacted. PA replied that patient should take $RemoveBef'80mg'inKNyEatVO$  telmisartan once daily and not split dose, and that patient has a return appointment scheduled for November. Patient completed paperwork today to prepare for discharge at next visit. Patient continues to be asymptomatic and left to go home. 30 day review completed. ITP sent to Dr. Emily Filbert, Medical Director of Cardiac Rehab. Continue with ITP unless changes are made by physician.             Comments: 30 day review

## 2021-08-18 MED ORDER — TETRACAINE HCL 0.5 % OP SOLN: 1.0000 [drp] | OPHTHALMIC | Status: AC | PRN

## 2021-08-18 MED ORDER — MOXIFLOXACIN HCL 0.5 % OP SOLN: 1.0000 [drp] | OPHTHALMIC | Status: DC | PRN

## 2021-08-18 MED ORDER — PHENYLEPHRINE HCL 10 % OP SOLN
1.0000 [drp] | OPHTHALMIC | Status: AC | PRN
  Administered 2021-08-19 (×3): 1 [drp] via OPHTHALMIC

## 2021-08-18 MED ORDER — SODIUM CHLORIDE 0.9 % IV SOLN: INTRAVENOUS | Status: DC

## 2021-08-18 MED ORDER — CYCLOPENTOLATE HCL 2 % OP SOLN
1.0000 [drp] | OPHTHALMIC | Status: AC | PRN
  Administered 2021-08-19 (×3): 1 [drp] via OPHTHALMIC

## 2021-08-19 ENCOUNTER — Ambulatory Visit
Admission: RE | Admit: 2021-08-19 | Discharge: 2021-08-19 | Disposition: A | Discharge status: Home / Self Care | Payer: Medicare Other | Attending: Ophthalmology | Admitting: Ophthalmology

## 2021-08-19 ENCOUNTER — Ambulatory Visit: Payer: Medicare Other | Admitting: Certified Registered"

## 2021-08-19 ENCOUNTER — Encounter
Admission: RE | Disposition: A | Discharge status: Home / Self Care | Payer: Self-pay | Source: Home / Self Care | Attending: Ophthalmology

## 2021-08-19 ENCOUNTER — Encounter: Payer: Self-pay | Admitting: Ophthalmology

## 2021-08-19 DIAGNOSIS — I509 Heart failure, unspecified: Secondary | ICD-10-CM | POA: Insufficient documentation

## 2021-08-19 DIAGNOSIS — Z886 Allergy status to analgesic agent status: Secondary | ICD-10-CM | POA: Diagnosis not present

## 2021-08-19 DIAGNOSIS — Z888 Allergy status to other drugs, medicaments and biological substances status: Secondary | ICD-10-CM | POA: Diagnosis not present

## 2021-08-19 DIAGNOSIS — Z7952 Long term (current) use of systemic steroids: Secondary | ICD-10-CM | POA: Diagnosis not present

## 2021-08-19 DIAGNOSIS — Z803 Family history of malignant neoplasm of breast: Secondary | ICD-10-CM | POA: Insufficient documentation

## 2021-08-19 DIAGNOSIS — Z79899 Other long term (current) drug therapy: Secondary | ICD-10-CM | POA: Diagnosis not present

## 2021-08-19 DIAGNOSIS — H2511 Age-related nuclear cataract, right eye: Secondary | ICD-10-CM | POA: Diagnosis not present

## 2021-08-19 DIAGNOSIS — I110 Hypertensive heart disease with heart failure: Secondary | ICD-10-CM | POA: Insufficient documentation

## 2021-08-19 DIAGNOSIS — Z885 Allergy status to narcotic agent status: Secondary | ICD-10-CM | POA: Insufficient documentation

## 2021-08-19 HISTORY — PX: CATARACT EXTRACTION W/PHACO: SHX586

## 2021-08-19 SURGERY — PHACOEMULSIFICATION, CATARACT, WITH IOL INSERTION
Anesthesia: Monitor Anesthesia Care | Site: Eye | Laterality: Right

## 2021-08-19 MED ORDER — BRIMONIDINE TARTRATE-TIMOLOL 0.2-0.5 % OP SOLN
OPHTHALMIC | Status: DC | PRN
  Administered 2021-08-19: 1 [drp] via OPHTHALMIC

## 2021-08-19 MED ORDER — ONDANSETRON HCL 4 MG/2ML IJ SOLN
INTRAMUSCULAR | Status: DC | PRN
Start: 2021-08-19 — End: 2021-08-19
  Administered 2021-08-19: 4 mg via INTRAVENOUS

## 2021-08-19 MED ORDER — CYCLOPENTOLATE HCL 2 % OP SOLN
OPHTHALMIC | Status: AC
  Administered 2021-08-19: 1 [drp] via OPHTHALMIC
  Filled 2021-08-19: qty 2

## 2021-08-19 MED ORDER — FENTANYL CITRATE (PF) 100 MCG/2ML IJ SOLN
INTRAMUSCULAR | Status: AC
  Filled 2021-08-19: qty 2

## 2021-08-19 MED ORDER — SIGHTPATH DOSE#1 BSS IO SOLN
INTRAOCULAR | Status: DC | PRN
  Administered 2021-08-19: 1 mL via INTRAMUSCULAR

## 2021-08-19 MED ORDER — SIGHTPATH DOSE#1 NA CHONDROIT SULF-NA HYALURON 40-17 MG/ML IO SOLN
INTRAOCULAR | Status: DC | PRN
  Administered 2021-08-19: 1 mL via INTRAOCULAR

## 2021-08-19 MED ORDER — SIGHTPATH DOSE#1 BSS IO SOLN
INTRAOCULAR | Status: DC | PRN
  Administered 2021-08-19: 15 mL

## 2021-08-19 MED ORDER — MIDAZOLAM HCL 2 MG/2ML IJ SOLN
INTRAMUSCULAR | Status: DC | PRN
  Administered 2021-08-19: 1 mg via INTRAVENOUS

## 2021-08-19 MED ORDER — PHENYLEPHRINE HCL 10 % OP SOLN
OPHTHALMIC | Status: AC
  Administered 2021-08-19: 1 [drp] via OPHTHALMIC
  Filled 2021-08-19: qty 5

## 2021-08-19 MED ORDER — MOXIFLOXACIN HCL 0.5 % OP SOLN
OPHTHALMIC | Status: AC
  Filled 2021-08-19: qty 3

## 2021-08-19 MED ORDER — SIGHTPATH DOSE#1 BSS IO SOLN
INTRAOCULAR | Status: DC | PRN
  Administered 2021-08-19: 78 mL via OPHTHALMIC

## 2021-08-19 MED ORDER — MOXIFLOXACIN HCL 0.5 % OP SOLN
OPHTHALMIC | Status: DC | PRN
  Administered 2021-08-19: 0.2 mL via OPHTHALMIC

## 2021-08-19 MED ORDER — TETRACAINE HCL 0.5 % OP SOLN
OPHTHALMIC | Status: AC
  Administered 2021-08-19: 1 [drp] via OPHTHALMIC
  Filled 2021-08-19: qty 4

## 2021-08-19 MED ORDER — MIDAZOLAM HCL 2 MG/2ML IJ SOLN
INTRAMUSCULAR | Status: AC
  Filled 2021-08-19: qty 2

## 2021-08-19 MED ORDER — FENTANYL CITRATE (PF) 100 MCG/2ML IJ SOLN
INTRAMUSCULAR | Status: DC | PRN
  Administered 2021-08-19 (×2): 25 ug via INTRAVENOUS

## 2021-08-19 SURGICAL SUPPLY — 15 items
GLOVE SURG ENC MOIS LTX SZ8 (GLOVE) ×2 IMPLANT
GLOVE SURG ENC TEXT LTX SZ6.5 (GLOVE) ×2 IMPLANT
GLOVE SURG ENC TEXT LTX SZ8 (GLOVE) ×2 IMPLANT
GOWN STRL REUS W/ TWL LRG LVL3 (GOWN DISPOSABLE) ×2 IMPLANT
GOWN STRL REUS W/TWL LRG LVL3 (GOWN DISPOSABLE) ×2
LENS IOL ACRSF IQ ULTRA 25.0 (Intraocular Lens) ×1 IMPLANT
LENS IOL ACRYSOF IQ 25.0 (Intraocular Lens) ×2 IMPLANT
NEEDLE FILTER BLUNT 18X 1/2SAF (NEEDLE) ×1
NEEDLE FILTER BLUNT 18X1 1/2 (NEEDLE) ×1 IMPLANT
RING MALYGIN (MISCELLANEOUS) ×2 IMPLANT
SYR 3ML LL SCALE MARK (SYRINGE) ×2 IMPLANT
SYR 5ML LL (SYRINGE) IMPLANT
SYR TB 1ML LUER SLIP (SYRINGE) ×2 IMPLANT
WATER STERILE IRR 250ML POUR (IV SOLUTION) ×2 IMPLANT
WIPE NON LINTING 3.25X3.25 (MISCELLANEOUS) IMPLANT

## 2021-08-19 NOTE — Op Note (Signed)
PREOPERATIVE DIAGNOSIS:  Nuclear sclerotic cataract of the right eye.   POSTOPERATIVE DIAGNOSIS:  Cataract   OPERATIVE PROCEDURE:ORPROCALL@   SURGEON:  Galen Manila, MD.   ANESTHESIA:  Anesthesiologist: Foye Deer, MD  1.      Managed anesthesia care. 2.      0.28ml of Shugarcaine was instilled in the eye following the paracentesis.   COMPLICATIONS: Viscoelastic was used to raise the pupil margin.  A  Malyugin ring was placed as the pupil would not achieve sufficient pharmacologic dilation to undergo cataract extraction safely.( The ring was removed atraumatically following insertion of the IOL.)    TECHNIQUE:   Stop and chop   DESCRIPTION OF PROCEDURE:  The patient was examined and consented in the preoperative holding area where the aforementioned topical anesthesia was applied to the right eye and then brought back to the Operating Room where the right eye was prepped and draped in the usual sterile ophthalmic fashion and a lid speculum was placed. A paracentesis was created with the side port blade and the anterior chamber was filled with viscoelastic. A near clear corneal incision was performed with the steel keratome. A continuous curvilinear capsulorrhexis was performed with a cystotome followed by the capsulorrhexis forceps. Hydrodissection and hydrodelineation were carried out with BSS on a blunt cannula. The lens was removed in a stop and chop  technique and the remaining cortical material was removed with the irrigation-aspiration handpiece. The capsular bag was inflated with viscoelastic and the Technis ZCB00  lens was placed in the capsular bag without complication. The remaining viscoelastic was removed from the eye with the irrigation-aspiration handpiece. The wounds were hydrated. The anterior chamber was flushed with BSS and the eye was inflated to physiologic pressure. 0.41ml of Vigamox was placed in the anterior chamber. The wounds were found to be water tight.  The eye was dressed with Combigan. The patient was given protective glasses to wear throughout the day and a shield with which to sleep tonight. The patient was also given drops with which to begin a drop regimen today and will follow-up with me in one day. Implant Name Type Inv. Item Serial No. Manufacturer Lot No. LRB No. Used Action  LENS IOL ACRYSOF IQ 25.0 - [id number] Intraocular Lens LENS IOL ACRYSOF IQ 25.0 [id number] ALCON  Right 1 Implanted   Procedure(s) with comments: CATARACT EXTRACTION PHACO AND INTRAOCULAR LENS PLACEMENT (IOC) RIGHT (Right) - 12.31 1:07.0  Electronically signed: Galen Manila 08/19/2021 11:10 AM

## 2021-08-19 NOTE — Transfer of Care (Signed)
Immediate Anesthesia Transfer of Care Note  Patient: Andrea Phillips  Procedure(s) Performed: CATARACT EXTRACTION PHACO AND INTRAOCULAR LENS PLACEMENT (IOC) RIGHT (Right: Eye)  Patient Location: PACU  Anesthesia Type:MAC  Level of Consciousness: awake and patient cooperative  Airway & Oxygen Therapy: Patient Spontanous Breathing  Post-op Assessment: Report given to RN and Post -op Vital signs reviewed and stable  Post vital signs: Reviewed and stable  Last Vitals:  Vitals Value Taken Time  BP 182/71 08/19/21 1113  Temp 36.2 C 08/19/21 1113  Pulse 52 08/19/21 1113  Resp 16 08/19/21 1113  SpO2 98 % 08/19/21 1113    Last Pain:  Vitals:   08/19/21 1113  TempSrc: Temporal  PainSc: 0-No pain         Complications: No notable events documented.

## 2021-08-19 NOTE — Discharge Instructions (Addendum)
AMBULATORY SURGERY  DISCHARGE INSTRUCTIONS   The drugs that you were given will stay in your system until tomorrow so for the next 24 hours you should not:  Drive an automobile Make any legal decisions Drink any alcoholic beverage   You may resume regular meals tomorrow.  Today it is better to start with liquids and gradually work up to solid foods.  You may eat anything you prefer, but it is better to start with liquids, then soup and crackers, and gradually work up to solid foods.   Please notify your doctor immediately if you have any unusual bleeding, trouble breathing, redness and pain at the surgery site, drainage, fever, or pain not relieved by medication.    Additional Instructions:   Follow [place] [facility] surgery instructions  Please contact your physician with any problems or Same Day Surgery at [phone], Monday through Friday 6 am to 4 pm, or [place] at [facility] number at [phone].

## 2021-08-19 NOTE — H&P (Signed)
[facility]   Primary Care Physician:  Margaretann Loveless, MD Ophthalmologist: Dr. Druscilla Brownie  Pre-Procedure History & Physical: HPI:  Andrea Phillips is a 86 y.o. female here for cataract surgery.   Past Medical History:  Diagnosis Date   Arthritis    ra   Asthma    CHF (congestive heart failure) (HCC)    Dysrhythmia    Edema extremities    GERD (gastroesophageal reflux disease)    Gout    Headache    History of hiatal hernia    Hypertension    Hypothyroidism    Shortness of breath dyspnea    Sleep apnea     Past Surgical History:  Procedure Laterality Date   APPENDECTOMY     BACK SURGERY     CARDIAC CATHETERIZATION     CATARACT EXTRACTION W/PHACO Left 03/09/2015   Procedure: CATARACT EXTRACTION PHACO AND INTRAOCULAR LENS PLACEMENT (IOC);  Surgeon: Sallee Lange, MD;  Location: [facility] ORS;  Service: Ophthalmology;  Laterality: Left;  [place] 01:31 AP% 26.1 CDE 43.72   CHOLECYSTECTOMY     EYE SURGERY     retina   JOINT REPLACEMENT     left knee   LEFT HEART CATH AND CORONARY ANGIOGRAPHY N/A 04/02/2021   Procedure: LEFT HEART CATH AND CORONARY ANGIOGRAPHY;  Surgeon: Lamar Blinks, MD;  Location: [facility] INVASIVE CV LAB;  Service: Cardiovascular;  Laterality: N/A;    Prior to Admission medications   Medication Sig Start Date End Date Taking? Authorizing Provider  albuterol (VENTOLIN HFA) 108 (90 Base) MCG/ACT inhaler Inhale 2 puffs into the lungs every 6 (six) hours as needed for wheezing or shortness of breath.   Yes [provider]  cetirizine (ZYRTEC) 10 MG tablet Take 10 mg by mouth daily.   Yes [provider]  fluocinonide (LIDEX) 0.05 % external solution Apply 1 application topically once a week.   Yes [provider]  fluticasone (FLONASE) 50 MCG/ACT nasal spray Place 1 spray into both nostrils 2 (two) times daily as needed for allergies or rhinitis.   Yes [provider]  furosemide (LASIX) 40 MG tablet Take 1 tablet (40  mg total) by mouth daily. Patient taking differently: Take 20 mg by mouth daily. 04/03/21 04/03/22 Yes Almon Hercules, MD  icosapent Ethyl (VASCEPA) 1 g capsule Take 2 g by mouth 2 (two) times daily with a meal.   Yes [provider]  levothyroxine (SYNTHROID) 112 MCG tablet Take 112 mcg by mouth daily. 02/23/21  Yes [provider]  meclizine (ANTIVERT) 25 MG tablet Take 25 mg by mouth 2 (two) times daily as needed for dizziness. 07/05/21  Yes [provider]  metoprolol succinate (TOPROL-XL) 50 MG 24 hr tablet Take 1 tablet (50 mg total) by mouth daily. Take with or immediately following a meal. Patient taking differently: Take 25 mg by mouth daily. Take with or immediately following a meal. 04/05/21  Yes Gonfa, Boyce Medici, MD  omeprazole (PRILOSEC OTC) 20 MG tablet Take 2 tablets (40 mg total) by mouth daily. Patient taking differently: Take 40 mg by mouth daily as needed (reflux). 04/04/21  Yes Almon Hercules, MD  telmisartan (MICARDIS) 40 MG tablet Take 80 mg by mouth daily. 03/06/21  Yes [provider]  guaiFENesin (MUCINEX) 600 MG 12 hr tablet Take 1 tablet (600 mg total) by mouth 2 (two) times daily. Patient not taking: No sig reported 04/04/21   Almon Hercules, MD  mometasone-formoterol (DULERA) 200-5 MCG/ACT AERO Inhale  2 puffs into the lungs 2 (two) times daily. Patient not taking: No sig reported 04/04/21   Almon Hercules, MD    Allergies as of 07/19/2021 - Review Complete 06/09/2021  Allergen Reaction Noted   Other Hives 03/05/2015   Ace inhibitors Cough 01/12/2009   Amlodipine Itching 01/19/2016   Aspirin  03/05/2015   Atorvastatin Other (See Comments) 04/09/2010   Codeine Other (See Comments) 04/02/2021   Conj estrog-medroxyprogest ace  10/02/2018   Prednisone  03/05/2015   Raloxifene  10/02/2018   Valsartan Itching 04/09/2010    Family History  Problem Relation Age of Onset   Breast cancer Cousin     Social History   Socioeconomic History    Marital status: Widowed    Spouse name: Not on file   Number of children: Not on file   Years of education: Not on file   Highest education level: Not on file  Occupational History   Not on file  Tobacco Use   Smoking status: Never   Smokeless tobacco: Never  Substance and Sexual Activity   Alcohol use: No   Drug use: No   Sexual activity: Not on file  Other Topics Concern   Not on file  Social History Narrative   Not on file   Social Determinants of Health   Financial Resource Strain: Not on file  Food Insecurity: Not on file  Transportation Needs: Not on file  Physical Activity: Not on file  Stress: Not on file  Social Connections: Not on file  Intimate Partner Violence: Not on file    Review of Systems: See HPI, otherwise negative ROS  Physical Exam: BP (!) 186/60   Pulse (!) 57   Temp 98.2 F (36.8 C) (Temporal)   Resp 18   Ht 5' (1.524 m)   Wt 72.6 kg   SpO2 98%   BMI 31.25 kg/m  General:   Alert, cooperative in NAD Head:  Normocephalic and atraumatic. Respiratory:  Normal work of breathing. Cardiovascular:  RRR  Impression/Plan: Andrea Phillips is here for cataract surgery.  Risks, benefits, limitations, and alternatives regarding cataract surgery have been reviewed with the patient.  Questions have been answered.  All parties agreeable.   Galen Manila, MD  08/19/2021, 10:26 AM

## 2021-08-19 NOTE — Anesthesia Preprocedure Evaluation (Addendum)
Anesthesia Evaluation  Patient identified by MRN, date of birth, ID band Patient awake    Reviewed: Allergy & Precautions, H&P , NPO status , Patient's Chart, lab work & pertinent test results, reviewed documented beta blocker date and time   Airway Mallampati: I  TM Distance: >3 FB Neck ROM: full    Dental no notable dental hx.    Pulmonary shortness of breath and with exertion, asthma , sleep apnea and Continuous Positive Airway Pressure Ventilation ,    Pulmonary exam normal breath sounds clear to auscultation       Cardiovascular Exercise Tolerance: Good hypertension, Pt. on home beta blockers and Pt. on medications +CHF  + dysrhythmias (Frequent PVCs)  Rhythm:regular Rate:Normal     Neuro/Psych  Headaches, negative psych ROS   GI/Hepatic Neg liver ROS, hiatal hernia, GERD  Controlled and Medicated,  Endo/Other  negative endocrine ROSHypothyroidism   Renal/GU negative Renal ROS  negative genitourinary   Musculoskeletal  (+) Arthritis ,   Abdominal   Peds  Hematology negative hematology ROS (+)   Anesthesia Other Findings   Reproductive/Obstetrics negative OB ROS                            Anesthesia Physical  Anesthesia Plan  ASA: III  Anesthesia Plan: MAC   Post-op Pain Management:    Induction: Intravenous  PONV Risk Score and Plan: Treatment may vary due to age or medical condition  Airway Management Planned: Nasal Cannula and Natural Airway  Additional Equipment:   Intra-op Plan:   Post-operative Plan:   Informed Consent: I have reviewed the patients History and Physical, chart, labs and discussed the procedure including the risks, benefits and alternatives for the proposed anesthesia with the patient or authorized representative who has indicated his/her understanding and acceptance.     Dental Advisory Given  Plan Discussed with: CRNA  Anesthesia Plan  Comments:        Anesthesia Quick Evaluation

## 2021-08-20 ENCOUNTER — Encounter: Payer: Self-pay | Admitting: Ophthalmology

## 2021-08-22 NOTE — Anesthesia Postprocedure Evaluation (Signed)
Anesthesia Post Note  Patient: Andrea Phillips  Procedure(s) Performed: CATARACT EXTRACTION PHACO AND INTRAOCULAR LENS PLACEMENT (IOC) RIGHT (Right: Eye)  Patient location during evaluation: PACU Anesthesia Type: MAC Level of consciousness: awake and alert Pain management: pain level controlled Vital Signs Assessment: post-procedure vital signs reviewed and stable Respiratory status: spontaneous breathing, nonlabored ventilation, respiratory function stable and patient connected to nasal cannula oxygen Cardiovascular status: stable and blood pressure returned to baseline Postop Assessment: no apparent nausea or vomiting Anesthetic complications: no   No notable events documented.   Last Vitals:  Vitals:   08/19/21 1113 08/19/21 1127  BP: (!) 182/71 (!) 174/66  Pulse: (!) 52 62  Resp: 16 16  Temp: (!) 36.2 C   SpO2: 98% 97%    Last Pain:  Vitals:   08/19/21 1113  TempSrc: Temporal  PainSc: 0-No pain                 [place] Ouch

## 2021-08-29 ENCOUNTER — Other Ambulatory Visit: Payer: Self-pay

## 2021-08-29 ENCOUNTER — Emergency Department: Payer: Medicare Other

## 2021-08-29 ENCOUNTER — Encounter: Payer: Self-pay | Admitting: Intensive Care

## 2021-08-29 ENCOUNTER — Inpatient Hospital Stay
Admission: EM | Admit: 2021-08-29 | Discharge: 2021-09-01 | DRG: 871 | Disposition: A | Discharge status: Home / Self Care | Payer: Medicare Other | Attending: Obstetrics and Gynecology | Admitting: Obstetrics and Gynecology

## 2021-08-29 DIAGNOSIS — E782 Mixed hyperlipidemia: Secondary | ICD-10-CM | POA: Diagnosis present

## 2021-08-29 DIAGNOSIS — H1820 Unspecified corneal edema: Secondary | ICD-10-CM | POA: Diagnosis present

## 2021-08-29 DIAGNOSIS — Z96652 Presence of left artificial knee joint: Secondary | ICD-10-CM | POA: Diagnosis present

## 2021-08-29 DIAGNOSIS — Z803 Family history of malignant neoplasm of breast: Secondary | ICD-10-CM

## 2021-08-29 DIAGNOSIS — Z79899 Other long term (current) drug therapy: Secondary | ICD-10-CM

## 2021-08-29 DIAGNOSIS — M1990 Unspecified osteoarthritis, unspecified site: Secondary | ICD-10-CM | POA: Diagnosis present

## 2021-08-29 DIAGNOSIS — I10 Essential (primary) hypertension: Secondary | ICD-10-CM | POA: Diagnosis present

## 2021-08-29 DIAGNOSIS — I5022 Chronic systolic (congestive) heart failure: Secondary | ICD-10-CM | POA: Diagnosis present

## 2021-08-29 DIAGNOSIS — J189 Pneumonia, unspecified organism: Secondary | ICD-10-CM | POA: Diagnosis not present

## 2021-08-29 DIAGNOSIS — A419 Sepsis, unspecified organism: Principal | ICD-10-CM | POA: Diagnosis present

## 2021-08-29 DIAGNOSIS — J441 Chronic obstructive pulmonary disease with (acute) exacerbation: Secondary | ICD-10-CM | POA: Diagnosis present

## 2021-08-29 DIAGNOSIS — M069 Rheumatoid arthritis, unspecified: Secondary | ICD-10-CM | POA: Diagnosis present

## 2021-08-29 DIAGNOSIS — Z7951 Long term (current) use of inhaled steroids: Secondary | ICD-10-CM

## 2021-08-29 DIAGNOSIS — I214 Non-ST elevation (NSTEMI) myocardial infarction: Secondary | ICD-10-CM | POA: Diagnosis present

## 2021-08-29 DIAGNOSIS — E039 Hypothyroidism, unspecified: Secondary | ICD-10-CM | POA: Diagnosis present

## 2021-08-29 DIAGNOSIS — Y95 Nosocomial condition: Secondary | ICD-10-CM | POA: Diagnosis present

## 2021-08-29 DIAGNOSIS — I493 Ventricular premature depolarization: Secondary | ICD-10-CM | POA: Diagnosis present

## 2021-08-29 DIAGNOSIS — I110 Hypertensive heart disease with heart failure: Secondary | ICD-10-CM | POA: Diagnosis present

## 2021-08-29 DIAGNOSIS — Z79890 Hormone replacement therapy (postmenopausal): Secondary | ICD-10-CM

## 2021-08-29 DIAGNOSIS — M109 Gout, unspecified: Secondary | ICD-10-CM | POA: Diagnosis present

## 2021-08-29 DIAGNOSIS — R0602 Shortness of breath: Secondary | ICD-10-CM | POA: Diagnosis not present

## 2021-08-29 DIAGNOSIS — G4733 Obstructive sleep apnea (adult) (pediatric): Secondary | ICD-10-CM | POA: Diagnosis present

## 2021-08-29 DIAGNOSIS — R6520 Severe sepsis without septic shock: Secondary | ICD-10-CM | POA: Diagnosis present

## 2021-08-29 DIAGNOSIS — K219 Gastro-esophageal reflux disease without esophagitis: Secondary | ICD-10-CM | POA: Diagnosis present

## 2021-08-29 DIAGNOSIS — J440 Chronic obstructive pulmonary disease with acute lower respiratory infection: Secondary | ICD-10-CM | POA: Diagnosis present

## 2021-08-29 DIAGNOSIS — Z28310 Unvaccinated for covid-19: Secondary | ICD-10-CM

## 2021-08-29 DIAGNOSIS — J4531 Mild persistent asthma with (acute) exacerbation: Secondary | ICD-10-CM | POA: Diagnosis present

## 2021-08-29 DIAGNOSIS — Z20822 Contact with and (suspected) exposure to covid-19: Secondary | ICD-10-CM | POA: Diagnosis present

## 2021-08-29 LAB — URINALYSIS, COMPLETE (UACMP) WITH MICROSCOPIC
Bacteria, UA: NONE SEEN
Bilirubin Urine: NEGATIVE
Glucose, UA: NEGATIVE mg/dL
Ketones, ur: 20 mg/dL — AB
Leukocytes,Ua: NEGATIVE
Nitrite: NEGATIVE
Protein, ur: NEGATIVE mg/dL
Specific Gravity, Urine: 1.011 (ref 1.005–1.030)
pH: 5 (ref 5.0–8.0)

## 2021-08-29 LAB — BASIC METABOLIC PANEL WITH GFR
Anion gap: 11 (ref 5–15)
BUN: 13 mg/dL (ref 8–23)
CO2: 21 mmol/L — ABNORMAL LOW (ref 22–32)
Calcium: 9.6 mg/dL (ref 8.9–10.3)
Chloride: 100 mmol/L (ref 98–111)
Creatinine, Ser: 0.83 mg/dL (ref 0.44–1.00)
GFR, Estimated: >60 mL/min
Glucose, Bld: 116 mg/dL — ABNORMAL HIGH (ref 70–99)
Potassium: 3.7 mmol/L (ref 3.5–5.1)
Sodium: 132 mmol/L — ABNORMAL LOW (ref 135–145)

## 2021-08-29 LAB — RESP PANEL BY RT-PCR (FLU A&B, COVID) ARPGX2
Influenza A by PCR: NEGATIVE
Influenza B by PCR: NEGATIVE
SARS Coronavirus 2 by RT PCR: NEGATIVE

## 2021-08-29 LAB — CBC
HCT: 46.1 % — ABNORMAL HIGH (ref 36.0–46.0)
Hemoglobin: 15 g/dL (ref 12.0–15.0)
MCH: 29.4 pg (ref 26.0–34.0)
MCHC: 32.5 g/dL (ref 30.0–36.0)
MCV: 90.2 fL (ref 80.0–100.0)
Platelets: 232 K/uL (ref 150–400)
RBC: 5.11 MIL/uL (ref 3.87–5.11)
RDW: 12.7 % (ref 11.5–15.5)
WBC: 15.4 K/uL — ABNORMAL HIGH (ref 4.0–10.5)
nRBC: 0 % (ref 0.0–0.2)

## 2021-08-29 LAB — PROTIME-INR
INR: 1.2 (ref 0.8–1.2)
Prothrombin Time: 15.2 seconds (ref 11.4–15.2)

## 2021-08-29 LAB — LACTIC ACID, PLASMA
Lactic Acid, Venous: 1.7 mmol/L (ref 0.5–1.9)
Lactic Acid, Venous: 2.3 mmol/L (ref 0.5–1.9)

## 2021-08-29 LAB — TROPONIN I (HIGH SENSITIVITY)
Troponin I (High Sensitivity): 20 ng/L — ABNORMAL HIGH
Troponin I (High Sensitivity): 25 ng/L — ABNORMAL HIGH

## 2021-08-29 LAB — BRAIN NATRIURETIC PEPTIDE: B Natriuretic Peptide: 408.8 pg/mL — ABNORMAL HIGH (ref 0.0–100.0)

## 2021-08-29 LAB — PROCALCITONIN: Procalcitonin: 0.48 ng/mL

## 2021-08-29 MED ORDER — LABETALOL HCL 5 MG/ML IV SOLN: 5.0000 mg | INTRAVENOUS | Status: DC | PRN

## 2021-08-29 MED ORDER — VANCOMYCIN HCL 1500 MG/300ML IV SOLN
1500.0000 mg | Freq: Once | INTRAVENOUS | Status: AC
  Administered 2021-08-29: 1500 mg via INTRAVENOUS
  Filled 2021-08-29: qty 300

## 2021-08-29 MED ORDER — ONDANSETRON HCL 4 MG/2ML IJ SOLN: 4.0000 mg | Freq: Four times a day (QID) | INTRAMUSCULAR | Status: DC | PRN

## 2021-08-29 MED ORDER — ENOXAPARIN SODIUM 40 MG/0.4ML IJ SOSY
40.0000 mg | PREFILLED_SYRINGE | INTRAMUSCULAR | Status: DC
  Administered 2021-08-29 – 2021-08-31 (×3): 40 mg via SUBCUTANEOUS
  Filled 2021-08-29 (×3): qty 0.4

## 2021-08-29 MED ORDER — FUROSEMIDE 10 MG/ML IJ SOLN
40.0000 mg | Freq: Once | INTRAMUSCULAR | Status: AC
  Administered 2021-08-29: 40 mg via INTRAVENOUS
  Filled 2021-08-29: qty 4

## 2021-08-29 MED ORDER — PANTOPRAZOLE SODIUM 40 MG PO TBEC: 40.0000 mg | DELAYED_RELEASE_TABLET | Freq: Every day | ORAL | Status: DC | PRN

## 2021-08-29 MED ORDER — VANCOMYCIN HCL 1000 MG/200ML IV SOLN
1000.0000 mg | INTRAVENOUS | Status: DC
Start: 2021-08-31 — End: 2021-08-30

## 2021-08-29 MED ORDER — SODIUM CHLORIDE 0.9 % IV SOLN: 2.0000 g | Freq: Two times a day (BID) | INTRAVENOUS | Status: DC

## 2021-08-29 MED ORDER — IRBESARTAN 150 MG PO TABS
150.0000 mg | ORAL_TABLET | Freq: Every day | ORAL | Status: DC
  Administered 2021-08-29 – 2021-09-01 (×4): 150 mg via ORAL
  Filled 2021-08-29 (×5): qty 1

## 2021-08-29 MED ORDER — MIDAZOLAM HCL 2 MG/2ML IJ SOLN
0.5000 mg | INTRAMUSCULAR | Status: DC | PRN
  Administered 2021-08-30: 0.5 mg via INTRAVENOUS
  Filled 2021-08-29: qty 2

## 2021-08-29 MED ORDER — MOMETASONE FURO-FORMOTEROL FUM 200-5 MCG/ACT IN AERO
2.0000 | INHALATION_SPRAY | Freq: Two times a day (BID) | RESPIRATORY_TRACT | Status: DC
  Administered 2021-08-30 – 2021-09-01 (×5): 2 via RESPIRATORY_TRACT
  Filled 2021-08-29: qty 8.8

## 2021-08-29 MED ORDER — ACETAMINOPHEN 325 MG PO TABS: 650.0000 mg | ORAL_TABLET | Freq: Four times a day (QID) | ORAL | Status: AC | PRN

## 2021-08-29 MED ORDER — IPRATROPIUM-ALBUTEROL 0.5-2.5 (3) MG/3ML IN SOLN
3.0000 mL | Freq: Once | RESPIRATORY_TRACT | Status: AC
  Administered 2021-08-29: 3 mL via RESPIRATORY_TRACT
  Filled 2021-08-29: qty 3

## 2021-08-29 MED ORDER — OMEPRAZOLE MAGNESIUM 20 MG PO TBEC: 40.0000 mg | DELAYED_RELEASE_TABLET | Freq: Every day | ORAL | Status: DC | PRN

## 2021-08-29 MED ORDER — METHYLPREDNISOLONE SODIUM SUCC 125 MG IJ SOLR
125.0000 mg | INTRAMUSCULAR | Status: AC
  Administered 2021-08-29: 125 mg via INTRAVENOUS
  Filled 2021-08-29: qty 2

## 2021-08-29 MED ORDER — LEVOTHYROXINE SODIUM 112 MCG PO TABS
112.0000 ug | ORAL_TABLET | Freq: Every day | ORAL | Status: DC
  Administered 2021-08-30 – 2021-09-01 (×3): 112 ug via ORAL
  Filled 2021-08-29 (×5): qty 1

## 2021-08-29 MED ORDER — FLUTICASONE PROPIONATE 50 MCG/ACT NA SUSP
1.0000 | Freq: Two times a day (BID) | NASAL | Status: DC | PRN
  Filled 2021-08-29: qty 16

## 2021-08-29 MED ORDER — METHYLPREDNISOLONE SODIUM SUCC 40 MG IJ SOLR
40.0000 mg | Freq: Every day | INTRAMUSCULAR | Status: DC
  Administered 2021-08-30: 40 mg via INTRAVENOUS
  Filled 2021-08-29: qty 1

## 2021-08-29 MED ORDER — DM-GUAIFENESIN ER 30-600 MG PO TB12
1.0000 | ORAL_TABLET | Freq: Two times a day (BID) | ORAL | Status: DC
  Administered 2021-08-29: 1 via ORAL
  Filled 2021-08-29: qty 1

## 2021-08-29 MED ORDER — ACETAMINOPHEN 650 MG RE SUPP
650.0000 mg | Freq: Four times a day (QID) | RECTAL | Status: AC | PRN
  Filled 2021-08-29: qty 1

## 2021-08-29 MED ORDER — VANCOMYCIN HCL IN DEXTROSE 1-5 GM/200ML-% IV SOLN: 1000.0000 mg | Freq: Once | INTRAVENOUS | Status: DC

## 2021-08-29 MED ORDER — SODIUM CHLORIDE 0.9 % IV SOLN
500.0000 mg | INTRAVENOUS | Status: DC
  Administered 2021-08-29 – 2021-08-31 (×3): 500 mg via INTRAVENOUS
  Filled 2021-08-29 (×4): qty 500

## 2021-08-29 MED ORDER — METOPROLOL SUCCINATE ER 25 MG PO TB24
25.0000 mg | ORAL_TABLET | Freq: Every day | ORAL | Status: DC
  Administered 2021-08-29 – 2021-09-01 (×4): 25 mg via ORAL
  Filled 2021-08-29 (×4): qty 1

## 2021-08-29 MED ORDER — SODIUM CHLORIDE 0.9 % IV SOLN
2.0000 g | Freq: Once | INTRAVENOUS | Status: AC
  Administered 2021-08-29: 2 g via INTRAVENOUS
  Filled 2021-08-29: qty 2

## 2021-08-29 MED ORDER — ONDANSETRON HCL 4 MG PO TABS: 4.0000 mg | ORAL_TABLET | Freq: Four times a day (QID) | ORAL | Status: DC | PRN

## 2021-08-29 MED ORDER — LACTATED RINGERS IV BOLUS
500.0000 mL | Freq: Once | INTRAVENOUS | Status: AC
Start: 2021-08-29 — End: 2021-08-29
  Administered 2021-08-29: 500 mL via INTRAVENOUS

## 2021-08-29 MED ORDER — SODIUM CHLORIDE 0.9 % IV SOLN
2.0000 g | Freq: Two times a day (BID) | INTRAVENOUS | Status: DC
  Administered 2021-08-30 – 2021-09-01 (×6): 2 g via INTRAVENOUS
  Filled 2021-08-29 (×7): qty 2

## 2021-08-29 MED ORDER — MECLIZINE HCL 25 MG PO TABS
25.0000 mg | ORAL_TABLET | Freq: Two times a day (BID) | ORAL | Status: DC | PRN
  Filled 2021-08-29: qty 1

## 2021-08-29 MED ORDER — ICOSAPENT ETHYL 1 G PO CAPS
2.0000 g | ORAL_CAPSULE | Freq: Two times a day (BID) | ORAL | Status: DC
  Administered 2021-08-29 – 2021-09-01 (×6): 2 g via ORAL
  Filled 2021-08-29 (×8): qty 2

## 2021-08-29 MED ORDER — IRBESARTAN 150 MG PO TABS: 150.0000 mg | ORAL_TABLET | Freq: Every day | ORAL | Status: DC

## 2021-08-29 MED ORDER — HYDROCOD POLST-CPM POLST ER 10-8 MG/5ML PO SUER
5.0000 mL | Freq: Every day | ORAL | Status: AC
  Administered 2021-08-29: 5 mL via ORAL
  Filled 2021-08-29: qty 5

## 2021-08-29 MED ORDER — IPRATROPIUM-ALBUTEROL 0.5-2.5 (3) MG/3ML IN SOLN
3.0000 mL | Freq: Four times a day (QID) | RESPIRATORY_TRACT | Status: DC
  Administered 2021-08-29 – 2021-08-30 (×3): 3 mL via RESPIRATORY_TRACT
  Filled 2021-08-29 (×2): qty 3

## 2021-08-29 NOTE — ED Notes (Signed)
Toniann Fail, MD in room. Plan to address lactic acid - 500 LR bolus and repeat lactic in 12 hours

## 2021-08-29 NOTE — Progress Notes (Signed)
[facility] following for sepsis protocol. 

## 2021-08-29 NOTE — ED Notes (Signed)
Pt to ED via POV for cough and shortness of breath x 1 day. Pt states that she is getting mucus up when coughs.

## 2021-08-29 NOTE — ED Triage Notes (Signed)
C/o cough, sob and dizziness. Cough started last week. SOb X2 days ago and worsens with exertion.

## 2021-08-29 NOTE — Consult Note (Signed)
CODE SEPSIS - PHARMACY COMMUNICATION  **Broad Spectrum Antibiotics should be administered within 1 hour of Sepsis diagnosis**  Time Code Sepsis Called/Page Received: 1554   Antibiotics Ordered: 1551  Time of 1st antibiotic administration: 1625      Sharen Hones ,PharmD, BCPS Clinical Pharmacist  08/29/2021  4:00 PM

## 2021-08-29 NOTE — ED Notes (Signed)
First contact: Assumed care of patient with Dorian RN. Received bedside report. Pt placed on cardiac monitor r/t sepsis dx. Pt requests to use bathroom. Able to ambulate to toilet with 1 person assist & void sans complications. Urine sample sent to lab.    Pt given warm blankets & socks per request. VS updated. Call light in reach, lights dimmed. Aware of plan of care. Denies further needs at this time.

## 2021-08-29 NOTE — ED Provider Notes (Signed)
Emergency Medicine Provider Triage Evaluation Note  Andrea Phillips , a 86 y.o. female  was evaluated in triage.  Pt complains of cough, shortness of breath, dizziness, chest pain.  Cough started last week.  Started short of breath for 2 days and is worse with exertion..  Review of Systems  Positive: Chest pain, shortness of breath, dizziness Negative: Fever, chills, swelling extremities, no vomiting diarrhea  Physical Exam  BP (!) 142/76 (BP Location: Left Arm)   Pulse (!) 102   Temp 98.7 F (37.1 C) (Oral)   Resp (!) 22   Ht 5' (1.524 m)   Wt 72.6 kg   SpO2 92%   BMI 31.25 kg/m  Gen:   Awake, no distress   Resp:  Normal effort, wheezing noted bilaterally MSK:   Moves extremities without difficulty  Other:    Medical Decision Making  Medically screening exam initiated at 10:42 AM.  Appropriate orders placed.  Avary C Eckels was informed that the remainder of the evaluation will be completed by another provider, this initial triage assessment does not replace that evaluation, and the importance of remaining in the ED until their evaluation is complete.  Labs and imaging ordered.  Concerns for PNA versus COVID versus MI   Faythe Ghee, PA-C 08/29/21 1043    Georga Hacking, MD 08/29/21 [phone]

## 2021-08-29 NOTE — H&P (Addendum)
History and Physical   Andrea Phillips [id number] DOB: Jan 06, 1935 DOA: 08/29/2021  PCP: Perrin Maltese, MD  Outpatient Specialists: Dr. George Ina, ophthalmology Patient coming from: home via [place]   I have personally briefly reviewed patient's old medical records in [place].  Chief Concern: Cough and shortness of breath  HPI: Andrea Phillips is a 86 y.o. female with medical history significant for hyperlipidemia, hypothyroid, hypertension, COPD, history of cataract surgery, GERD, who presents emergency department for chief concerns of cough and shortness of breath.  She reports that her symptoms started around Wednesday, 08/25/2021, with shortness of breath and new cough that is productive of light yellow phlegm.  She further endorses that diarrhea started Wednesday or Thursday. She reports the stool is loose and goes multiple times per day.  She denies changes to her diet.  She does endorse loss of appetite.  She denies dysuria, nausea, vomiting, syncope, lost of consciousness. She denies abdominal pain, swelling her legs.  She denies muscle aches.  She denies fever, headaches, speech difficulty.  She does endorse feeling 'foggy' since Wednesday.  She sees her grandchildren everyday, 20 month, 61 yo, 47 year, 84 year old.  Per patient, they all recently had upper respiratory infections and symptoms.  Social history: She lives in her home and her granddaugther and great grandchildren live with her. She denies tobacco, etoh, recreational drug use. She formerly worked in a hospital.   Vaccination history: She is not vaccinated for covid 19. She is vaccinated for her influenza shot this year.   ROS: Constitutional: no weight change, no fever ENT/Mouth: no sore throat, no rhinorrhea Eyes: no eye pain, no vision changes Cardiovascular: no chest pain, no dyspnea,  no edema, no palpitations Respiratory: + cough, + sputum, no wheezing Gastrointestinal: no nausea, no vomiting, +  diarrhea, no constipation Genitourinary: no urinary incontinence, no dysuria, no hematuria Musculoskeletal: no arthralgias, no myalgias Skin: no skin lesions, no pruritus, Neuro: + weakness, no loss of consciousness, no syncope Psych: no anxiety, no depression, + decrease appetite Heme/Lymph: no bruising, no bleeding  ED Course: Discussed with emergency medicine provider, patient requiring hospitalization for chief concerns of pneumonia.  Vitals in the emergency department was remarkable for temperature of 98.7, respiration rate of 22, heart rate of 102 and improved to 69, initial blood pressure 142/76, SPO2 was 92% on room air.  Labs in the emergency department was remarkable for sodium 132, potassium 2.7, chloride 100, bicarb of 21, serum creatinine of 0.83, BUN 13, nonfasting blood glucose 116, GFR greater than 60.  WBC 15.4, hemoglobin 15, platelets 232.  High sensitive troponin was 20 and increased to 25.  COVID/influenza A/influenza B PCR were negative.  ED provider ordered vancomycin, cefepime, duo nebs, 1 dose of Solu-Medrol  Assessment/Plan  Principal Problem:   Multifocal pneumonia Active Problems:   Essential hypertension   Frequent PVCs   OSA (obstructive sleep apnea)   NSTEMI (non-ST elevated myocardial infarction) (HCC)   Hypothyroidism   Hyperlipidemia, mixed   # Multifocal pneumonia # Meet sepsis criteria with increased respiration rate, heart rate, leukocytosis, source of pneumonia - Status post vancomycin and cefepime per EDP, we will continue this - I added azithromycin for atypical coverage - Solu-Medrol 40 mg IV daily ordered for 08/30/2021 at 10 AM, 2 doses ordered - Blood cultures x2, UA ordered - MRSA PCR ordered, add BNP to prior collection - Incentive spirometry, flutter valve - Patient is maintaining appropriate MAP, no IV fluid bolus indicated at this  time - Counseled patient extensively on incentive spirometry and flutter valve, 5 reps each, every  1-2 hours while awake.  Endorsed understanding and compliance - Symptomatic support: Mucinex scheduled twice daily during the day; Tussionex oral nightly, 1 dose.  Patient states that she tolerated Tussionex well and did not have difficulty.  # Diarrhea-query gastroenteritis however given recent medical procedure, C. difficile cannot be excluded at this time - Check C. difficile, GI panel - Continue enteric precautions at this time  # Hypertension-patient takes telmisartan 80 mg daily, metoprolol succinate 25 mg daily - Alternative to telmisartan with irbesartan 150 mg daily ordered for 08/30/2021 - Labetalol 5 mg IV as needed every 4 hours for SBP greater than 175, 2 doses ordered  # Hypothyroid-levothyroxine 112 mcg daily resumed # Hyperlipidemia-Vascepa 1 g capsules, patient takes 2 g by mouth twice daily with a meal # GERD-PPI as needed for reflux # OSA-CPAP nightly ordered # COPD-resumed home inhalers  Chart reviewed.   08/19/2021: Right cataract extraction and intraocular lens placement  04/02/2021: Complete echo was read as left ventricular ejection fraction estimated at 25 to 30%.  The left ventricle demonstrates global hypokinesis.  Right ventricular systolic function is normal.  The right ventricular size is normal.  DVT prophylaxis: Enoxaparin 40 mg subcutaneous every 24 hours Code Status: full code  Diet: Heart healthy Family Communication: Lucila Maine is aware that patient has pneumonia and is admitted via EDP Disposition Plan: Pending clinical course Consults called: None at this time Admission status: Observation, MedSurg, telemetry  Past Medical History:  Diagnosis Date   Arthritis    ra   Asthma    CHF (congestive heart failure) (HCC)    Dysrhythmia    Edema extremities    GERD (gastroesophageal reflux disease)    Gout    Headache    History of hiatal hernia    Hypertension    Hypothyroidism    Shortness of breath dyspnea    Sleep apnea    Past Surgical  History:  Procedure Laterality Date   APPENDECTOMY     BACK SURGERY     CARDIAC CATHETERIZATION     CATARACT EXTRACTION W/PHACO Left 03/09/2015   Procedure: CATARACT EXTRACTION PHACO AND INTRAOCULAR LENS PLACEMENT (IOC);  Surgeon: Sallee Lange, MD;  Location: [facility] ORS;  Service: Ophthalmology;  Laterality: Left;  [place] 01:31 AP% 26.1 CDE 43.72   CATARACT EXTRACTION W/PHACO Right 08/19/2021   Procedure: CATARACT EXTRACTION PHACO AND INTRAOCULAR LENS PLACEMENT (IOC) RIGHT;  Surgeon: Galen Manila, MD;  Location: [facility] ORS;  Service: Ophthalmology;  Laterality: Right;  12.31 1:07.0   CHOLECYSTECTOMY     EYE SURGERY     retina   JOINT REPLACEMENT     left knee   LEFT HEART CATH AND CORONARY ANGIOGRAPHY N/A 04/02/2021   Procedure: LEFT HEART CATH AND CORONARY ANGIOGRAPHY;  Surgeon: Lamar Blinks, MD;  Location: [facility];  Service: Cardiovascular;  Laterality: N/A;   Social History:  reports that she has never smoked. She has never used smokeless tobacco. She reports that she does not drink alcohol and does not use drugs.  Allergies  Allergen Reactions   Aspirin Hives   Other Hives    Yellow dye 6 FOOD COLOR YELLOW POWDER    Atorvastatin Other (See Comments)    unknown   Codeine Other (See Comments)    Doesn't know   Conj Estrog-Medroxyprogest Ace Other (See Comments)    PREMPRO 0.3-1.5 MG ORAL TABLET unknown   Prednisone Other (See  Comments)    Chest pain   Raloxifene Other (See Comments)    EVISTA 60 MG ORAL TABLET unknown   Ace Inhibitors Cough   Amlodipine Itching   Valsartan Itching   Family History  Problem Relation Age of Onset   Breast cancer Cousin    Family history: Family history reviewed and not pertinent  Prior to Admission medications   Medication Sig Start Date End Date Taking? Authorizing Provider  albuterol (VENTOLIN HFA) 108 (90 Base) MCG/ACT inhaler Inhale 2 puffs into the lungs every 6 (six) hours as needed for wheezing or  shortness of breath.    [provider]  cetirizine (ZYRTEC) 10 MG tablet Take 10 mg by mouth daily.    [provider]  fluocinonide (LIDEX) 0.05 % external solution Apply 1 application topically once a week.    [provider]  fluticasone (FLONASE) 50 MCG/ACT nasal spray Place 1 spray into both nostrils 2 (two) times daily as needed for allergies or rhinitis.    [provider]  furosemide (LASIX) 40 MG tablet Take 1 tablet (40 mg total) by mouth daily. Patient taking differently: Take 20 mg by mouth daily. 04/03/21 04/03/22  Mercy Riding, MD  guaiFENesin (MUCINEX) 600 MG 12 hr tablet Take 1 tablet (600 mg total) by mouth 2 (two) times daily. Patient not taking: No sig reported 04/04/21   Mercy Riding, MD  icosapent Ethyl (VASCEPA) 1 g capsule Take 2 g by mouth 2 (two) times daily with a meal.    [provider]  levothyroxine (SYNTHROID) 112 MCG tablet Take 112 mcg by mouth daily. 02/23/21   [provider]  meclizine (ANTIVERT) 25 MG tablet Take 25 mg by mouth 2 (two) times daily as needed for dizziness. 07/05/21   [provider]  metoprolol succinate (TOPROL-XL) 50 MG 24 hr tablet Take 1 tablet (50 mg total) by mouth daily. Take with or immediately following a meal. Patient taking differently: Take 25 mg by mouth daily. Take with or immediately following a meal. 04/05/21   Gonfa, Charlesetta Ivory, MD  mometasone-formoterol (DULERA) 200-5 MCG/ACT AERO Inhale 2 puffs into the lungs 2 (two) times daily. Patient not taking: No sig reported 04/04/21   Mercy Riding, MD  omeprazole (PRILOSEC OTC) 20 MG tablet Take 2 tablets (40 mg total) by mouth daily. Patient taking differently: Take 40 mg by mouth daily as needed (reflux). 04/04/21   Mercy Riding, MD  telmisartan (MICARDIS) 40 MG tablet Take 80 mg by mouth daily. 03/06/21   [provider]   Physical Exam: Vitals:   08/29/21 1014 08/29/21 1035 08/29/21 1036 08/29/21 1258  BP: 138/70 (!)  142/76  (!) 146/63  Pulse: (!) 108 (!) 102  97  Resp: 16 (!) 22  (!) 21  Temp: 99 F (37.2 C) 98.7 F (37.1 C)  98.7 F (37.1 C)  TempSrc: Oral Oral  Oral  SpO2: 92% 92%  98%  Weight:   72.6 kg   Height:   5' (1.524 m)    Constitutional: appears age-appropriate, frail, NAD, calm, comfortable Eyes: PERRL, lids and conjunctivae normal ENMT: Mucous membranes are moist. Posterior pharynx clear of any exudate or lesions. Age-appropriate dentition. Hearing appropriate Neck: normal, supple, no masses, no thyromegaly Respiratory: Diffuse decreased lung sounds on auscultation bilaterally. no crackles.  Diffuse wheezing on auscultation bilaterally.  Increased respiratory effort. No accessory muscle use.  Cardiovascular: Regular rate and rhythm, no murmurs / rubs / gallops. No extremity edema. 2+  pedal pulses. No carotid bruits.  Abdomen: Obese abdomen, no tenderness, no masses palpated, no hepatosplenomegaly. Bowel sounds positive.  Musculoskeletal: no clubbing / cyanosis. No joint deformity upper and lower extremities. Good ROM, no contractures, no atrophy. Normal muscle tone.  Skin: no rashes, lesions, ulcers. No induration Neurologic: Sensation intact. Strength 5/5 in all 4.  Psychiatric: Normal judgment and insight. Alert and oriented x 3. Normal mood.   EKG: independently reviewed, showing sinus tachycardia with rate of 103, QTc 440  Chest x-ray on Admission: I personally reviewed and I agree with radiologist reading as below.  DG Chest 2 View  Result Date: 08/29/2021 CLINICAL DATA:  Chest pain and shortness of breath. EXAM: CHEST - 2 VIEW COMPARISON:  May 01, 2021 FINDINGS: Tortuosity and calcific atherosclerotic disease of the aorta. Cardiomediastinal silhouette is normal. Mediastinal contours appear intact. Streaky airspace opacities are present in the right lower lobe, and less likely in the left lower lobe and right middle lobe. Osseous structures are without acute abnormality. Soft  tissues are grossly normal. IMPRESSION: Streaky airspace opacities in the right lower lobe, and less likely in the left lower lobe and right middle lobe. Findings may be seen with multifocal pneumonia, including atypical/viral pneumonia. Electronically Signed   By: Fidela Salisbury M.D.   On: 08/29/2021 11:31    Labs on Admission: I have personally reviewed following labs  CBC: Recent Labs  Lab 08/29/21 1038  WBC 15.4*  HGB 15.0  HCT 46.1*  MCV 90.2  PLT [id number]   Basic Metabolic Panel: Recent Labs  Lab 08/29/21 1038  NA 132*  K 3.7  CL 100  CO2 21*  GLUCOSE 116*  BUN 13  CREATININE 0.83  CALCIUM 9.6   GFR: Estimated Creatinine Clearance: 44.8 mL/min (by C-G formula based on SCr of 0.83 mg/dL).  Urine analysis:    Component Value Date/Time   COLORURINE COLORLESS (A) 04/02/2021 0301   APPEARANCEUR CLEAR (A) 04/02/2021 0301   LABSPEC 1.004 (L) 04/02/2021 0301   PHURINE 5.0 04/02/2021 0301   GLUCOSEU NEGATIVE 04/02/2021 0301   HGBUR NEGATIVE 04/02/2021 0301   BILIRUBINUR NEGATIVE 04/02/2021 0301   KETONESUR NEGATIVE 04/02/2021 0301   PROTEINUR NEGATIVE 04/02/2021 0301   NITRITE NEGATIVE 04/02/2021 0301   LEUKOCYTESUR NEGATIVE 04/02/2021 0301   Dr. Tobie Poet [facility]  If 7PM-7AM, please contact overnight-coverage provider If 7AM-7PM, please contact day coverage provider [email or web address]  08/29/2021, 4:34 PM

## 2021-08-29 NOTE — Consult Note (Signed)
Pharmacy Antibiotic Note  Andrea Phillips is a 86 y.o. female admitted on 08/29/2021 with sepsis.  Pharmacy has been consulted for cefepime and Vancomycin dosing.  Plan: Cefepime 2 gram q12H Vancomycin 1500 mg x 1 LD followed by Initiate Vancomycin 1000 mg Q36H. Goal AUC 400-550 Estimated AUC 532/Cmin: 11.6 Scr 0.83, IBW, Vd 0.5 MRSA PCR ordered     Height: 5' (152.4 cm) Weight: 72.6 kg (160 lb) IBW/kg (Calculated) : 45.5  Temp (24hrs), Avg:98.8 F (37.1 C), Min:98.7 F (37.1 C), Max:99 F (37.2 C)  Recent Labs  Lab 08/29/21 1038  WBC 15.4*  CREATININE 0.83    Estimated Creatinine Clearance: 44.8 mL/min (by C-G formula based on SCr of 0.83 mg/dL).    Allergies  Allergen Reactions   Aspirin Hives   Other Hives    Yellow dye 6 FOOD COLOR YELLOW POWDER    Atorvastatin Other (See Comments)    unknown   Codeine Other (See Comments)    Doesn't know   Conj Estrog-Medroxyprogest Ace Other (See Comments)    PREMPRO 0.3-1.5 MG ORAL TABLET unknown   Prednisone Other (See Comments)    Chest pain   Raloxifene Other (See Comments)    EVISTA 60 MG ORAL TABLET unknown   Ace Inhibitors Cough   Amlodipine Itching   Valsartan Itching    Antimicrobials this admission: 11/6 cefepime >>  11/6 Vancomycin >>  11/6 Azithromycin  Dose adjustments this admission:   Microbiology results: 11/6 BCx: sent 11/6 GI panel: sent 11/6 C. Diff: sent  11/6 MRSA PCR: sent  Thank you for allowing pharmacy to be a part of this patient's care.  Sharen Hones, PharmD, BCPS Clinical Pharmacist   08/29/2021 5:06 PM

## 2021-08-29 NOTE — ED Notes (Signed)
Lab called to inform of lactic result of 2.3 mmol/L. Previous lactic 1.7 mmol/L.

## 2021-08-29 NOTE — ED Provider Notes (Signed)
[facility] Emergency Department Provider Note   ____________________________________________   Event Date/Time   First MD Initiated Contact with Patient 08/29/21 1534     (approximate)  I have reviewed the triage vital signs and the nursing notes.   HISTORY  Chief Complaint Cough, Shortness of Breath, and Dizziness    HPI Andrea Phillips is a 86 y.o. female with a history of asthma CHF hypertension hypothyroidism  Patient reports she had cataract surgery end of October.  About a week now she has developed a cough fevers chest congestion seems to be worsening slowly over time.  She come for evaluation for cough also noticed a little bit of wheezing.  She does use her albuterol inhaler at home which provide some relief  Symptoms worsen with exertion  Past Medical History:  Diagnosis Date   Arthritis    ra   Asthma    CHF (congestive heart failure) (HCC)    Dysrhythmia    Edema extremities    GERD (gastroesophageal reflux disease)    Gout    Headache    History of hiatal hernia    Hypertension    Hypothyroidism    Shortness of breath dyspnea    Sleep apnea     Patient Active Problem List   Diagnosis Date Noted   NSTEMI (non-ST elevated myocardial infarction) (HCC) 04/02/2021   Acute on chronic systolic CHF (congestive heart failure) (HCC) 04/02/2021   Hypothyroidism 04/02/2021   Syncope and collapse 04/07/2020   Right hip pain 04/07/2020   Frequent PVCs 04/07/2020   OSA (obstructive sleep apnea) 04/07/2020   Elevated troponin 04/07/2020   Varicose veins of both lower extremities with pain 04/04/2017   Essential hypertension 04/04/2017   Hyperlipidemia, mixed 11/22/2006    Past Surgical History:  Procedure Laterality Date   APPENDECTOMY     BACK SURGERY     CARDIAC CATHETERIZATION     CATARACT EXTRACTION W/PHACO Left 03/09/2015   Procedure: CATARACT EXTRACTION PHACO AND INTRAOCULAR LENS PLACEMENT (IOC);  Surgeon: Sallee Lange, MD;  Location: [facility] ORS;  Service: Ophthalmology;  Laterality: Left;  [place] 01:31 AP% 26.1 CDE 43.72   CATARACT EXTRACTION W/PHACO Right 08/19/2021   Procedure: CATARACT EXTRACTION PHACO AND INTRAOCULAR LENS PLACEMENT (IOC) RIGHT;  Surgeon: Galen Manila, MD;  Location: [facility] ORS;  Service: Ophthalmology;  Laterality: Right;  12.31 1:07.0   CHOLECYSTECTOMY     EYE SURGERY     retina   JOINT REPLACEMENT     left knee   LEFT HEART CATH AND CORONARY ANGIOGRAPHY N/A 04/02/2021   Procedure: LEFT HEART CATH AND CORONARY ANGIOGRAPHY;  Surgeon: Lamar Blinks, MD;  Location: [facility] INVASIVE CV LAB;  Service: Cardiovascular;  Laterality: N/A;    Prior to Admission medications   Medication Sig Start Date End Date Taking? Authorizing Provider  albuterol (VENTOLIN HFA) 108 (90 Base) MCG/ACT inhaler Inhale 2 puffs into the lungs every 6 (six) hours as needed for wheezing or shortness of breath.    [provider]  cetirizine (ZYRTEC) 10 MG tablet Take 10 mg by mouth daily.    [provider]  fluocinonide (LIDEX) 0.05 % external solution Apply 1 application topically once a week.    [provider]  fluticasone (FLONASE) 50 MCG/ACT nasal spray Place 1 spray into both nostrils 2 (two) times daily as needed for allergies or rhinitis.    [provider]  furosemide (LASIX) 40 MG tablet Take 1 tablet (40 mg total) by mouth daily.  Patient taking differently: Take 20 mg by mouth daily. 04/03/21 04/03/22  Almon Hercules, MD  guaiFENesin (MUCINEX) 600 MG 12 hr tablet Take 1 tablet (600 mg total) by mouth 2 (two) times daily. Patient not taking: No sig reported 04/04/21   Almon Hercules, MD  icosapent Ethyl (VASCEPA) 1 g capsule Take 2 g by mouth 2 (two) times daily with a meal.    [provider]  levothyroxine (SYNTHROID) 112 MCG tablet Take 112 mcg by mouth daily. 02/23/21   [provider]  meclizine (ANTIVERT) 25 MG tablet Take 25 mg by mouth 2  (two) times daily as needed for dizziness. 07/05/21   [provider]  metoprolol succinate (TOPROL-XL) 50 MG 24 hr tablet Take 1 tablet (50 mg total) by mouth daily. Take with or immediately following a meal. Patient taking differently: Take 25 mg by mouth daily. Take with or immediately following a meal. 04/05/21   Gonfa, Boyce Medici, MD  mometasone-formoterol (DULERA) 200-5 MCG/ACT AERO Inhale 2 puffs into the lungs 2 (two) times daily. Patient not taking: No sig reported 04/04/21   Almon Hercules, MD  omeprazole (PRILOSEC OTC) 20 MG tablet Take 2 tablets (40 mg total) by mouth daily. Patient taking differently: Take 40 mg by mouth daily as needed (reflux). 04/04/21   Almon Hercules, MD  telmisartan (MICARDIS) 40 MG tablet Take 80 mg by mouth daily. 03/06/21   [provider]    Allergies Aspirin, Other, Atorvastatin, Codeine, Conj estrog-medroxyprogest ace, Prednisone, Raloxifene, Ace inhibitors, Amlodipine, and Valsartan  Family History  Problem Relation Age of Onset   Breast cancer Cousin     Social History Social History   Tobacco Use   Smoking status: Never   Smokeless tobacco: Never  Substance Use Topics   Alcohol use: No   Drug use: No    Review of Systems Constitutional: Fevers fatigue but still drinking fluids well.  Eyes: No visual changes. ENT: No sore throat. Cardiovascular: Denies chest pain. Respiratory: See HPI Gastrointestinal: No abdominal pain.   Genitourinary: Negative for dysuria. Musculoskeletal: Negative for back pain.  Has not noticed any swelling or retaining of fluid. Skin: Negative for rash. Neurological: Negative for headaches, areas of focal weakness or numbness.    ____________________________________________   PHYSICAL EXAM:  VITAL SIGNS: ED Triage Vitals  Enc Vitals Group     BP 08/29/21 1014 138/70     Pulse Rate 08/29/21 1014 (!) 108     Resp 08/29/21 1014 16     Temp 08/29/21 1014 99 F (37.2 C)     Temp Source 08/29/21  1014 Oral     SpO2 08/29/21 1014 92 %     Weight 08/29/21 1036 160 lb (72.6 kg)     Height 08/29/21 1036 5' (1.524 m)     Head Circumference --      Peak Flow --      Pain Score 08/29/21 1014 0     Pain Loc --      Pain Edu? --      Excl. in GC? --     Constitutional: Alert and oriented.  Mildly ill-appearing chest slightly tachypneic Eyes: Conjunctivae are normal. Head: Atraumatic. Nose: No congestion/rhinnorhea. Mouth/Throat: Mucous membranes are moist. Neck: No stridor.  Cardiovascular: Normal rate, regular rhythm. Grossly normal heart sounds.  Good peripheral circulation. Respiratory: Slight tachypnea.  Speaks in phrases but no oxygen deficit.  She does exhibit mild expiratory wheeze in the lower lobes bilaterally.  She has  rhonchorous lung sounds in seems to demonstrate some slight crackles noted in the right and left lower lung bases Gastrointestinal: Soft and nontender. No distention. Musculoskeletal: No lower extremity tenderness nor edema.  No venous cords or congestion.  No calf or thigh tenderness.  Denies any history of blood clots Neurologic:  Normal speech and language. No gross focal neurologic deficits are appreciated.  Skin:  Skin is warm, dry and intact. No rash noted. Psychiatric: Mood and affect are normal. Speech and behavior are normal.  ____________________________________________   LABS (all labs ordered are listed, but only abnormal results are displayed)  Labs Reviewed  BASIC METABOLIC PANEL - Abnormal; Notable for the following components:      Result Value   Sodium 132 (*)    CO2 21 (*)    Glucose, Bld 116 (*)    All other components within normal limits  CBC - Abnormal; Notable for the following components:   WBC 15.4 (*)    HCT 46.1 (*)    All other components within normal limits  TROPONIN I (HIGH SENSITIVITY) - Abnormal; Notable for the following components:   Troponin I (High Sensitivity) 20 (*)    All other components within normal limits   TROPONIN I (HIGH SENSITIVITY) - Abnormal; Notable for the following components:   Troponin I (High Sensitivity) 25 (*)    All other components within normal limits  RESP PANEL BY RT-PCR (FLU A&B, COVID) ARPGX2  CULTURE, BLOOD (ROUTINE X 2)  CULTURE, BLOOD (ROUTINE X 2)  LACTIC ACID, PLASMA  LACTIC ACID, PLASMA  URINALYSIS, COMPLETE (UACMP) WITH MICROSCOPIC  PROTIME-INR   ____________________________________________  EKG  Reviewed and interpreted by me at 1050 Heart rate 100 QRs 90 QTc 440 Sinus tachycardia, occasional PVCs.  Mild nonspecific T wave abnormality noted in multiple leads.  Of note the patient denies chest pain, however having reviewed this we deceive very minimal elevation of troponin far below that suggestive of ACS (20-25 range) ____________________________________________  RADIOLOGY  DG Chest 2 View  Result Date: 08/29/2021 CLINICAL DATA:  Chest pain and shortness of breath. EXAM: CHEST - 2 VIEW COMPARISON:  May 01, 2021 FINDINGS: Tortuosity and calcific atherosclerotic disease of the aorta. Cardiomediastinal silhouette is normal. Mediastinal contours appear intact. Streaky airspace opacities are present in the right lower lobe, and less likely in the left lower lobe and right middle lobe. Osseous structures are without acute abnormality. Soft tissues are grossly normal. IMPRESSION: Streaky airspace opacities in the right lower lobe, and less likely in the left lower lobe and right middle lobe. Findings may be seen with multifocal pneumonia, including atypical/viral pneumonia. Electronically Signed   By: Ted Mcalpine M.D.   On: 08/29/2021 11:31    Chest x-ray reviewed, opacities noted in right lower lobe.  Some potentially some seen left lower and right middle lobe ____________________________________________   PROCEDURES  Procedure(s) performed: None  Procedures  Critical Care performed: No  ____________________________________________   INITIAL  IMPRESSION / ASSESSMENT AND PLAN / ED COURSE  Pertinent labs & imaging results that were available during my care of the patient were reviewed by me and considered in my medical decision making (see chart for details).   1 week progressively increasing cough fever.  Noted to have increased work of breathing slight wheezing consistent with some element of bronchospasm.  No oxygen deficit but does show increased work of breathing.  Elevated white count as well as imaging that demonstrates concern for multifocal infection.  In light of her recent  cataract surgery, this would increase her risk for healthcare associated infection.  Will place on broad-spectrum treatment including vancomycin and cefepime.  Discussed with the patient we will treat her likely underlying asthma exacerbation associated as well with nebulizer treatment and steroids.    Clinical Course as of 08/29/21 1559  Sun Aug 29, 2021  1549 Discussed with patient her allergy to steroid particular prednisone.  She reports that if she takes it for more than 3 days it causes her restlessness and anxiety.  Risks and benefits of use of Solu-Medrol seem to favor benefit.  We will provide steroid here [MQ]    Clinical Course User Index [MQ] Sharyn Creamer, MD   Patient has no associated chest pain no clinical exam findings suggest DVT or pulmonary embolism.  Clinical history elevated white count multifocal appearance and infiltrative process noted on chest x-ray appears most consistent with pneumonia.  Admission discussed with Dr. Sedalia Muta  ____________________________________________   FINAL CLINICAL IMPRESSION(S) / ED DIAGNOSES  Final diagnoses:  HCAP (healthcare-associated pneumonia)  Mild persistent asthma with exacerbation  Sepsis      Note:  This document was prepared using Dragon voice recognition software and may include unintentional dictation errors       Sharyn Creamer, MD 08/29/21 [phone]

## 2021-08-29 NOTE — Sepsis Progress Note (Signed)
From Elink standpoint, the 2nd lactic acid level has been addressed by the bedside RN with MD aware with future labs

## 2021-08-29 NOTE — Consult Note (Signed)
PHARMACY -  BRIEF ANTIBIOTIC NOTE   Pharmacy has received consult(s) for cefepime and Vancomycin from an ED provider.  The patient's profile has been reviewed for ht/wt/allergies/indication/available labs.    One time order(s) placed for  Cefepime 2 gram Vancomycin 1500 mg   Further antibiotics/pharmacy consults should be ordered by admitting physician if indicated.                       Thank you, Sharen Hones, PharmD, BCPS Clinical Pharmacist   08/29/2021  3:58 PM

## 2021-08-30 DIAGNOSIS — J189 Pneumonia, unspecified organism: Secondary | ICD-10-CM | POA: Diagnosis not present

## 2021-08-30 LAB — CBC
HCT: 40.1 % (ref 36.0–46.0)
Hemoglobin: 13.6 g/dL (ref 12.0–15.0)
MCH: 29.7 pg (ref 26.0–34.0)
MCHC: 33.9 g/dL (ref 30.0–36.0)
MCV: 87.6 fL (ref 80.0–100.0)
Platelets: 282 10*3/uL (ref 150–400)
RBC: 4.58 MIL/uL (ref 3.87–5.11)
RDW: 12.6 % (ref 11.5–15.5)
WBC: 15.9 10*3/uL — ABNORMAL HIGH (ref 4.0–10.5)
nRBC: 0 % (ref 0.0–0.2)

## 2021-08-30 LAB — MRSA NEXT GEN BY PCR, NASAL: MRSA by PCR Next Gen: NOT DETECTED

## 2021-08-30 LAB — BASIC METABOLIC PANEL
Anion gap: 10 (ref 5–15)
BUN: 24 mg/dL — ABNORMAL HIGH (ref 8–23)
CO2: 25 mmol/L (ref 22–32)
Calcium: 10 mg/dL (ref 8.9–10.3)
Chloride: 102 mmol/L (ref 98–111)
Creatinine, Ser: 1.1 mg/dL — ABNORMAL HIGH (ref 0.44–1.00)
GFR, Estimated: 50 mL/min — ABNORMAL LOW (ref >60)
Glucose, Bld: 176 mg/dL — ABNORMAL HIGH (ref 70–99)
Potassium: 3.7 mmol/L (ref 3.5–5.1)
Sodium: 137 mmol/L (ref 135–145)

## 2021-08-30 LAB — PROCALCITONIN: Procalcitonin: 0.72 ng/mL

## 2021-08-30 LAB — LACTIC ACID, PLASMA: Lactic Acid, Venous: 2.3 mmol/L (ref 0.5–1.9)

## 2021-08-30 MED ORDER — IPRATROPIUM-ALBUTEROL 0.5-2.5 (3) MG/3ML IN SOLN
RESPIRATORY_TRACT | Status: AC
  Filled 2021-08-30: qty 3

## 2021-08-30 MED ORDER — ALBUTEROL SULFATE (2.5 MG/3ML) 0.083% IN NEBU: 2.5000 mg | INHALATION_SOLUTION | RESPIRATORY_TRACT | Status: DC | PRN

## 2021-08-30 MED ORDER — IPRATROPIUM-ALBUTEROL 0.5-2.5 (3) MG/3ML IN SOLN
3.0000 mL | Freq: Three times a day (TID) | RESPIRATORY_TRACT | Status: DC
  Administered 2021-08-30 – 2021-08-31 (×3): 3 mL via RESPIRATORY_TRACT
  Filled 2021-08-30 (×3): qty 3

## 2021-08-30 MED ORDER — GUAIFENESIN ER 600 MG PO TB12
600.0000 mg | ORAL_TABLET | Freq: Two times a day (BID) | ORAL | Status: DC
  Administered 2021-08-30 – 2021-09-01 (×5): 600 mg via ORAL
  Filled 2021-08-30 (×5): qty 1

## 2021-08-30 MED ORDER — LACTATED RINGERS IV SOLN: INTRAVENOUS | Status: AC

## 2021-08-30 MED ORDER — DEXTROMETHORPHAN-GUAIFENESIN 10-100 MG/5ML PO LIQD
5.0000 mL | ORAL | Status: DC | PRN
  Filled 2021-08-30 (×3): qty 5

## 2021-08-30 NOTE — Care Management Obs Status (Signed)
MEDICARE OBSERVATION STATUS NOTIFICATION   Patient Details  Name: MONNICA SALTSMAN MRN: [id number] Date of Birth: 1935-03-22   Medicare Observation Status Notification Given:  Yes    Margarito Liner, LCSW 08/30/2021, 3:55 PM

## 2021-08-30 NOTE — Progress Notes (Signed)
PROGRESS NOTE    Andrea Phillips  HEK:[id number] DOB: July 26, 1935 DOA: 08/29/2021 PCP: Perrin Maltese, MD    Brief Narrative:  86 y.o. female with medical history significant for hyperlipidemia, hypothyroid, hypertension, COPD, history of cataract surgery, GERD, who presents emergency department for chief concerns of cough and shortness of breath.   She reports that her symptoms started around Wednesday, 08/25/2021, with shortness of breath and new cough that is productive of light yellow phlegm.  She further endorses that diarrhea started Wednesday or Thursday. She reports the stool is loose and goes multiple times per day.  She denies changes to her diet.  She does endorse loss of appetite.   She denies dysuria, nausea, vomiting, syncope, lost of consciousness. She denies abdominal pain, swelling her legs.  She denies muscle aches.  She denies fever, headaches, speech difficulty.  She does endorse feeling 'foggy' since Wednesday.  11/7: Patient reports improvement in respiratory symptoms since admission.  Still coughing   Assessment & Plan:   Principal Problem:   Multifocal pneumonia Active Problems:   Essential hypertension   Frequent PVCs   OSA (obstructive sleep apnea)   NSTEMI (non-ST elevated myocardial infarction) (HCC)   Hypothyroidism   Hyperlipidemia, mixed  Multifocal pneumonia Severe sepsis secondary to above Severe sepsis criteria met with tachypnea, tachycardia, leukocytosis, elevated lactic acid Symptomatically improving since admission Plan: Continue broad-spectrum antibiotics vancomycin, azithromycin and cefepime for now Check MRSA screen, if negative DC vancomycin Supplemental IV fluids for 8 hours Repeat lactic acid, ensure clearance Mucolytic's Bronchodilators Monitor vitals and fever curve  Acute diarrhea Appears resolved Low suspicion for C. difficile/infectious diarrhea Plan: DC C. difficile and GI PCR assays DC enteric  precautions  Hypertension Irbesartan 150 mg daily Metoprolol succinate 25 mg daily Labetalol every 4 hours as needed  Hypothyroidism PTA Synthroid  Hyperlipidemia PTA Vascepa capsules  GERD PPI  Obstructive sleep apnea Nightly CPAP  COPD Stable PTA bronchodilators  Chronic systolic congestive heart failure No evidence of exacerbation Last echocardiogram EF 25 to 30% Cautious while on IV fluids    DVT prophylaxis: SQ Lovenox Code Status: Full Family Communication: None today Disposition Plan: Status is: Observation  The patient will require care spanning > 2 midnights and should be moved to inpatient because: Patient with multifocal pneumonia.  Remains on IV antibiotics.      Level of care: Med-Surg  Consultants:  None  Procedures:  None  Antimicrobials: Vancomycin Cefepime Azithromycin   Subjective: Patient seen and examined.  Reports symptomatic improvement since admission.  Does endorse cough.  No pain complaints.  Objective: Vitals:   08/30/21 0417 08/30/21 0426 08/30/21 0742 08/30/21 0750  BP:  (!) 125/53 (!) 154/115   Pulse: (!) 58 (!) 55 73 76  Resp: _0 Temp: (!) 97.5 F (36.4 C)  97.7 F (36.5 C)   TempSrc: Oral  Oral   SpO2: 97%  94% 96%  Weight:      Height:        Intake/Output Summary (Last 24 hours) at 08/30/2021 1014 Last data filed at 08/29/2021 2100 Gross per 24 hour  Intake 350.54 ml  Output 750 ml  Net -399.46 ml   Filed Weights   08/29/21 1036 08/29/21 2206  Weight: 72.6 kg 71.6 kg    Examination:  General exam: No acute distress Respiratory system: Diffusely decreased lung sounds bilaterally.  Mild end expiratory wheeze.  Normal work of breathing.  2 L Cardiovascular system: S1-S2, RRR, no murmurs Gastrointestinal system:  Abdomen is nondistended, soft and nontender. No organomegaly or masses felt. Normal bowel sounds heard. Central nervous system: Alert and oriented. No focal neurological  deficits. Extremities: Symmetric 5 x 5 power. Skin: No rashes, lesions or ulcers Psychiatry: Judgement and insight appear normal. Mood & affect appropriate.     Data Reviewed: I have personally reviewed following labs and imaging studies  CBC: Recent Labs  Lab 08/29/21 1038 08/30/21 0452  WBC 15.4* 15.9*  HGB 15.0 13.6  HCT 46.1* 40.1  MCV 90.2 87.6  PLT 232 511   Basic Metabolic Panel: Recent Labs  Lab 08/29/21 1038 08/30/21 0452  NA 132* 137  K 3.7 3.7  CL 100 102  CO2 21* 25  GLUCOSE 116* 176*  BUN 13 24*  CREATININE 0.83 1.10*  CALCIUM 9.6 10.0   GFR: Estimated Creatinine Clearance: 33.6 mL/min (A) (by C-G formula based on SCr of 1.1 mg/dL (H)). Liver Function Tests: No results for input(s): AST, ALT, ALKPHOS, BILITOT, PROT, ALBUMIN in the last 168 hours. No results for input(s): LIPASE, AMYLASE in the last 168 hours. No results for input(s): AMMONIA in the last 168 hours. Coagulation Profile: Recent Labs  Lab 08/29/21 1724  INR 1.2   Cardiac Enzymes: No results for input(s): CKTOTAL, CKMB, CKMBINDEX, TROPONINI in the last 168 hours. BNP (last 3 results) No results for input(s): PROBNP in the last 8760 hours. HbA1C: No results for input(s): HGBA1C in the last 72 hours. CBG: No results for input(s): GLUCAP in the last 168 hours. Lipid Profile: No results for input(s): CHOL, HDL, LDLCALC, TRIG, CHOLHDL, LDLDIRECT in the last 72 hours. Thyroid Function Tests: No results for input(s): TSH, T4TOTAL, FREET4, T3FREE, THYROIDAB in the last 72 hours. Anemia Panel: No results for input(s): VITAMINB12, FOLATE, FERRITIN, TIBC, IRON, RETICCTPCT in the last 72 hours. Sepsis Labs: Recent Labs  Lab 08/29/21 1724 08/29/21 1751 08/30/21 0452 08/30/21 0745  PROCALCITON 0.48  --  0.72  --   LATICACIDVEN 1.7 2.3*  --  2.3*    Recent Results (from the past 240 hour(s))  Resp Panel by RT-PCR (Flu A&B, Covid) Nasopharyngeal Swab     Status: None   Collection Time:  08/29/21 10:39 AM   Specimen: Nasopharyngeal Swab; Nasopharyngeal(NP) swabs in vial transport medium  Result Value Ref Range Status   SARS Coronavirus 2 by RT PCR NEGATIVE NEGATIVE Final    Comment: (NOTE) SARS-CoV-2 target nucleic acids are NOT DETECTED.  The SARS-CoV-2 RNA is generally detectable in upper respiratory specimens during the acute phase of infection. The lowest concentration of SARS-CoV-2 viral copies this assay can detect is 138 copies/mL. A negative result does not preclude SARS-Cov-2 infection and should not be used as the sole basis for treatment or other patient management decisions. A negative result may occur with  improper specimen collection/handling, submission of specimen other than nasopharyngeal swab, presence of viral mutation(s) within the areas targeted by this assay, and inadequate number of viral copies(<138 copies/mL). A negative result must be combined with clinical observations, patient history, and epidemiological information. The expected result is Negative.  Fact Sheet for Patients:  EntrepreneurPulse.com.au  Fact Sheet for Healthcare Providers:  IncredibleEmployment.be  This test is no t yet approved or cleared by the [place] FDA and  has been authorized for detection and/or diagnosis of SARS-CoV-2 by FDA under an Emergency Use Authorization (EUA). This EUA will remain  in effect (meaning this test can be used) for the duration of the COVID-19 declaration under Section 564(b)(1) of the  Act, 21 U.S.C.section 360bbb-3(b)(1), unless the authorization is terminated  or revoked sooner.       Influenza A by PCR NEGATIVE NEGATIVE Final   Influenza B by PCR NEGATIVE NEGATIVE Final    Comment: (NOTE) The Xpert Xpress SARS-CoV-2/FLU/RSV plus assay is intended as an aid in the diagnosis of influenza from Nasopharyngeal swab specimens and should not be used as a sole basis for treatment. Nasal washings  and aspirates are unacceptable for Xpert Xpress SARS-CoV-2/FLU/RSV testing.  Fact Sheet for Patients: EntrepreneurPulse.com.au  Fact Sheet for Healthcare Providers: IncredibleEmployment.be  This test is not yet approved or cleared by the [place] FDA and has been authorized for detection and/or diagnosis of SARS-CoV-2 by FDA under an Emergency Use Authorization (EUA). This EUA will remain in effect (meaning this test can be used) for the duration of the COVID-19 declaration under Section 564(b)(1) of the Act, 21 U.S.C. section 360bbb-3(b)(1), unless the authorization is terminated or revoked.  Performed at [facility], [address] [place], [place] [address]          Radiology Studies: DG Chest 2 View  Result Date: 08/29/2021 CLINICAL DATA:  Chest pain and shortness of breath. EXAM: CHEST - 2 VIEW COMPARISON:  May 01, 2021 FINDINGS: Tortuosity and calcific atherosclerotic disease of the aorta. Cardiomediastinal silhouette is normal. Mediastinal contours appear intact. Streaky airspace opacities are present in the right lower lobe, and less likely in the left lower lobe and right middle lobe. Osseous structures are without acute abnormality. Soft tissues are grossly normal. IMPRESSION: Streaky airspace opacities in the right lower lobe, and less likely in the left lower lobe and right middle lobe. Findings may be seen with multifocal pneumonia, including atypical/viral pneumonia. Electronically Signed   By: Fidela Salisbury M.D.   On: 08/29/2021 11:31        Scheduled Meds:  enoxaparin (LOVENOX) injection  40 mg Subcutaneous Q24H   guaiFENesin  600 mg Oral BID   icosapent Ethyl  2 g Oral BID WC   ipratropium-albuterol  3 mL Nebulization QID   ipratropium-albuterol       irbesartan  150 mg Oral Daily   levothyroxine  112 mcg Oral Q0600   methylPREDNISolone (SOLU-MEDROL) injection  40 mg Intravenous Daily   metoprolol  succinate  25 mg Oral Daily   mometasone-formoterol  2 puff Inhalation BID   Continuous Infusions:  azithromycin Stopped (08/29/21 2035)   ceFEPime (MAXIPIME) IV 2 g (08/30/21 0515)   [START ON 08/31/2021] vancomycin       LOS: 0 days    Time spent: 35 minutes    Sidney Ace, MD [facility]   If 7PM-7AM, please contact night-coverage  08/30/2021, 10:14 AM

## 2021-08-30 NOTE — Progress Notes (Signed)
Patient refused bipap for the night.  Remains on oxygen via nasal cannula. 

## 2021-08-30 NOTE — Discharge Instructions (Signed)

## 2021-08-31 DIAGNOSIS — Z79890 Hormone replacement therapy (postmenopausal): Secondary | ICD-10-CM | POA: Diagnosis not present

## 2021-08-31 DIAGNOSIS — Z803 Family history of malignant neoplasm of breast: Secondary | ICD-10-CM | POA: Diagnosis not present

## 2021-08-31 DIAGNOSIS — J4531 Mild persistent asthma with (acute) exacerbation: Secondary | ICD-10-CM | POA: Diagnosis present

## 2021-08-31 DIAGNOSIS — K219 Gastro-esophageal reflux disease without esophagitis: Secondary | ICD-10-CM | POA: Diagnosis present

## 2021-08-31 DIAGNOSIS — E782 Mixed hyperlipidemia: Secondary | ICD-10-CM | POA: Diagnosis present

## 2021-08-31 DIAGNOSIS — E039 Hypothyroidism, unspecified: Secondary | ICD-10-CM | POA: Diagnosis present

## 2021-08-31 DIAGNOSIS — I110 Hypertensive heart disease with heart failure: Secondary | ICD-10-CM | POA: Diagnosis present

## 2021-08-31 DIAGNOSIS — Z79899 Other long term (current) drug therapy: Secondary | ICD-10-CM | POA: Diagnosis not present

## 2021-08-31 DIAGNOSIS — M1990 Unspecified osteoarthritis, unspecified site: Secondary | ICD-10-CM | POA: Diagnosis present

## 2021-08-31 DIAGNOSIS — I493 Ventricular premature depolarization: Secondary | ICD-10-CM | POA: Diagnosis present

## 2021-08-31 DIAGNOSIS — H1820 Unspecified corneal edema: Secondary | ICD-10-CM | POA: Diagnosis present

## 2021-08-31 DIAGNOSIS — R0602 Shortness of breath: Secondary | ICD-10-CM | POA: Diagnosis present

## 2021-08-31 DIAGNOSIS — Z20822 Contact with and (suspected) exposure to covid-19: Secondary | ICD-10-CM | POA: Diagnosis present

## 2021-08-31 DIAGNOSIS — Z28310 Unvaccinated for covid-19: Secondary | ICD-10-CM | POA: Diagnosis not present

## 2021-08-31 DIAGNOSIS — J441 Chronic obstructive pulmonary disease with (acute) exacerbation: Secondary | ICD-10-CM | POA: Diagnosis present

## 2021-08-31 DIAGNOSIS — I5022 Chronic systolic (congestive) heart failure: Secondary | ICD-10-CM | POA: Diagnosis present

## 2021-08-31 DIAGNOSIS — M109 Gout, unspecified: Secondary | ICD-10-CM | POA: Diagnosis present

## 2021-08-31 DIAGNOSIS — J189 Pneumonia, unspecified organism: Secondary | ICD-10-CM | POA: Diagnosis present

## 2021-08-31 DIAGNOSIS — R6520 Severe sepsis without septic shock: Secondary | ICD-10-CM | POA: Diagnosis present

## 2021-08-31 DIAGNOSIS — A419 Sepsis, unspecified organism: Secondary | ICD-10-CM | POA: Diagnosis present

## 2021-08-31 DIAGNOSIS — G4733 Obstructive sleep apnea (adult) (pediatric): Secondary | ICD-10-CM | POA: Diagnosis present

## 2021-08-31 DIAGNOSIS — M069 Rheumatoid arthritis, unspecified: Secondary | ICD-10-CM | POA: Diagnosis present

## 2021-08-31 DIAGNOSIS — J440 Chronic obstructive pulmonary disease with acute lower respiratory infection: Secondary | ICD-10-CM | POA: Diagnosis present

## 2021-08-31 DIAGNOSIS — I214 Non-ST elevation (NSTEMI) myocardial infarction: Secondary | ICD-10-CM | POA: Diagnosis present

## 2021-08-31 DIAGNOSIS — Z96652 Presence of left artificial knee joint: Secondary | ICD-10-CM | POA: Diagnosis present

## 2021-08-31 DIAGNOSIS — Y95 Nosocomial condition: Secondary | ICD-10-CM | POA: Diagnosis present

## 2021-08-31 LAB — BASIC METABOLIC PANEL
Anion gap: 10 (ref 5–15)
BUN: 26 mg/dL — ABNORMAL HIGH (ref 8–23)
CO2: 26 mmol/L (ref 22–32)
Calcium: 11 mg/dL — ABNORMAL HIGH (ref 8.9–10.3)
Chloride: 101 mmol/L (ref 98–111)
Creatinine, Ser: 1.07 mg/dL — ABNORMAL HIGH (ref 0.44–1.00)
GFR, Estimated: 51 mL/min — ABNORMAL LOW (ref >60)
Glucose, Bld: 145 mg/dL — ABNORMAL HIGH (ref 70–99)
Potassium: 3.5 mmol/L (ref 3.5–5.1)
Sodium: 137 mmol/L (ref 135–145)

## 2021-08-31 LAB — CBC WITH DIFFERENTIAL/PLATELET
Abs Immature Granulocytes: 0.18 10*3/uL — ABNORMAL HIGH (ref 0.00–0.07)
Basophils Absolute: 0.1 10*3/uL (ref 0.0–0.1)
Basophils Relative: 0 %
Eosinophils Absolute: 0 10*3/uL (ref 0.0–0.5)
Eosinophils Relative: 0 %
HCT: 40.6 % (ref 36.0–46.0)
Hemoglobin: 13.4 g/dL (ref 12.0–15.0)
Immature Granulocytes: 1 %
Lymphocytes Relative: 5 %
Lymphs Abs: 1.1 10*3/uL (ref 0.7–4.0)
MCH: 28.8 pg (ref 26.0–34.0)
MCHC: 33 g/dL (ref 30.0–36.0)
MCV: 87.1 fL (ref 80.0–100.0)
Monocytes Absolute: 1.1 10*3/uL — ABNORMAL HIGH (ref 0.1–1.0)
Monocytes Relative: 5 %
Neutro Abs: 19.2 10*3/uL — ABNORMAL HIGH (ref 1.7–7.7)
Neutrophils Relative %: 89 %
Platelets: 382 10*3/uL (ref 150–400)
RBC: 4.66 MIL/uL (ref 3.87–5.11)
RDW: 12.8 % (ref 11.5–15.5)
WBC: 21.6 10*3/uL — ABNORMAL HIGH (ref 4.0–10.5)
nRBC: 0 % (ref 0.0–0.2)

## 2021-08-31 LAB — LACTIC ACID, PLASMA: Lactic Acid, Venous: 2.1 mmol/L (ref 0.5–1.9)

## 2021-08-31 MED ORDER — ENSURE ENLIVE PO LIQD
237.0000 mL | Freq: Two times a day (BID) | ORAL | Status: DC
Start: 2021-09-01 — End: 2021-09-01
  Administered 2021-09-01 (×2): 237 mL via ORAL

## 2021-08-31 MED ORDER — ADULT MULTIVITAMIN W/MINERALS CH
1.0000 | ORAL_TABLET | Freq: Every day | ORAL | Status: DC
  Administered 2021-09-01: 1 via ORAL
  Filled 2021-08-31: qty 1

## 2021-08-31 MED ORDER — IPRATROPIUM-ALBUTEROL 0.5-2.5 (3) MG/3ML IN SOLN
3.0000 mL | Freq: Four times a day (QID) | RESPIRATORY_TRACT | Status: DC
Start: 2021-08-31 — End: 2021-09-01
  Administered 2021-08-31 – 2021-09-01 (×4): 3 mL via RESPIRATORY_TRACT
  Filled 2021-08-31 (×4): qty 3

## 2021-08-31 MED ORDER — GUAIFENESIN-DM 100-10 MG/5ML PO SYRP
5.0000 mL | ORAL_SOLUTION | ORAL | Status: DC | PRN
  Administered 2021-08-31 (×2): 5 mL via ORAL
  Filled 2021-08-31 (×2): qty 5

## 2021-08-31 MED ORDER — METHYLPREDNISOLONE SODIUM SUCC 40 MG IJ SOLR
40.0000 mg | Freq: Three times a day (TID) | INTRAMUSCULAR | Status: AC
  Administered 2021-08-31 – 2021-09-01 (×3): 40 mg via INTRAVENOUS
  Filled 2021-08-31 (×3): qty 1

## 2021-08-31 MED ORDER — PREDNISOLON-MOXIFLOX-BROMFENAC 1-0.5-0.075 % OP SOLN
1.0000 [drp] | Freq: Four times a day (QID) | OPHTHALMIC | Status: DC
Start: 2021-08-31 — End: 2021-09-01
  Administered 2021-08-31 – 2021-09-01 (×5): 1 [drp] via OPHTHALMIC
  Filled 2021-08-31: qty 8

## 2021-08-31 MED ORDER — BRIMONIDINE TARTRATE-TIMOLOL 0.2-0.5 % OP SOLN
1.0000 [drp] | Freq: Two times a day (BID) | OPHTHALMIC | Status: DC
  Administered 2021-08-31 – 2021-09-01 (×2): 1 [drp] via OPHTHALMIC
  Filled 2021-08-31: qty 5

## 2021-08-31 MED ORDER — NETARSUDIL DIMESYLATE 0.02 % OP SOLN
1.0000 [drp] | Freq: Every day | OPHTHALMIC | Status: DC
  Administered 2021-08-31: 1 [drp] via OPHTHALMIC
  Filled 2021-08-31: qty 2.5

## 2021-08-31 NOTE — Progress Notes (Addendum)
Mobility Specialist - Progress Note   08/31/21 1056  Mobility  Activity Ambulated in hall  Level of Assistance Standby assist, set-up cues, supervision of patient - no hands on  Assistive Device Front wheel walker  Distance Ambulated (ft) 260 ft  Mobility Ambulated with assistance in hallway  Mobility Response Tolerated well  Mobility performed by Mobility specialist  $Mobility charge 1 Mobility    During mobility: 80-90s HR, >90% SpO2   Pt ambulated in hallway with RW. Mild SOB on RA. Mildly fatigued. Returned to room and reapplied 2L [place] for recovery despite good O2 sats throughout session. Still coughing. RPE 3/10. Pt pleased she was able to increase ambulatory distance this date. Left in bed with alarm set.    Filiberto Pinks Mobility Specialist 08/31/21, 12:12 PM

## 2021-08-31 NOTE — Progress Notes (Addendum)
PROGRESS NOTE    Andrea Phillips  DJM:[id number] DOB: 01-23-1935 DOA: 08/29/2021 PCP: Perrin Maltese, MD    Brief Narrative:  86 y.o. female with medical history significant for hyperlipidemia, hypothyroid, hypertension, COPD, history of cataract surgery, GERD, who presents emergency department for chief concerns of cough and shortness of breath.   She reports that her symptoms started around Wednesday, 08/25/2021, with shortness of breath and new cough that is productive of light yellow phlegm.  She further endorses that diarrhea started Wednesday or Thursday. She reports the stool is loose and goes multiple times per day.  She denies changes to her diet.  She does endorse loss of appetite.   She denies dysuria, nausea, vomiting, syncope, lost of consciousness. She denies abdominal pain, swelling her legs.  She denies muscle aches.  She denies fever, headaches, speech difficulty.  She does endorse feeling 'foggy' since Wednesday.  11/7: Patient reports improvement in respiratory symptoms since admission.  Still coughing 11/8: Respiratory status not at baseline.  Patient wheezing.   Assessment & Plan:   Principal Problem:   Multifocal pneumonia Active Problems:   Essential hypertension   Frequent PVCs   OSA (obstructive sleep apnea)   NSTEMI (non-ST elevated myocardial infarction) ([place])   Hypothyroidism   Hyperlipidemia, mixed   CAP (community acquired pneumonia)  Multifocal pneumonia Severe sepsis secondary to above Severe sepsis criteria met with tachypnea, tachycardia, leukocytosis, elevated lactic acid Symptomatically improving since admission 11/8: Still coughing and wheezing now.  Vancomycin stopped yesterday Plan: Continue cefepime and azithromycin Normal fluids Mucolytic's Bronchodilators Monitor vitals and fever curve  Corneal edema Patient was scheduled for outpatient eye surgery with Dr. George Ina on 11/8 Due to her current hospitalization this had to be  canceled Ophthalmology evaluated patient in house Provided instructions on inpatient eyedrops Plan: Inpatient eyedrops per ophthalmology recommendations Please notify Dr. George Ina via secure chat when patient is ready for discharge Patient will need eye surgery no later than next Tuesday 11/15 Will need to follow-up with ophthalmology office on the day of discharge  COPD with mild exacerbation Patient wheezing on exam noted 11/8 Plan: Solu-Medrol 40 mg IV every 8 hours for 1 day Scheduled and as needed nebulizers  Acute diarrhea Appears resolved Low suspicion for C. difficile/infectious diarrhea Plan: DC C. difficile and GI PCR assays DC enteric precautions  Hypertension Irbesartan 150 mg daily Metoprolol succinate 25 mg daily Labetalol every 4 hours as needed  Hypothyroidism PTA Synthroid  Hyperlipidemia PTA Vascepa capsules  GERD PPI  Obstructive sleep apnea Nightly CPAP  Chronic systolic congestive heart failure No evidence of exacerbation Last echocardiogram EF 25 to 30% Cautious while on IV fluids    DVT prophylaxis: SQ Lovenox Code Status: Full Family Communication: Niece Santiago Glad [phone] on 11/8 Disposition Plan: Status is: Inpatient  Remains inpatient appropriate because: Community-acquired pneumonia and mild COPD exacerbation.  Disposition will be 1 to 2 days.            Level of care: Med-Surg  Consultants:  None  Procedures:  None  Antimicrobials: Vancomycin Cefepime Azithromycin   Subjective: Patient seen and examined.  Reports symptomatic improvement since admission.  Does endorse cough.  No pain complaints.  Objective: Vitals:   08/30/21 1944 08/31/21 0424 08/31/21 0727 08/31/21 0809  BP: (!) 156/64 (!) 146/66  (!) 155/67  Pulse: 79 69  66  Resp: _0 Temp: (!) 97.4 F (36.3 C) 97.8 F (36.6 C)  97.7 F (36.5 C)  TempSrc:  Oral  Oral  SpO2: 95% 96% 96% 94%  Weight:      Height:        Intake/Output  Summary (Last 24 hours) at 08/31/2021 1146 Last data filed at 08/31/2021 1050 Gross per 24 hour  Intake 928.28 ml  Output --  Net 928.28 ml   Filed Weights   08/29/21 1036 08/29/21 2206  Weight: 72.6 kg 71.6 kg    Examination:  General exam: No apparent distress Respiratory system: Diffusely decreased lung sounds.  End expiratory wheeze.  Normal work of breathing.  2 L Cardiovascular system: S1-S2, RRR, no murmurs Gastrointestinal system: Abdomen is nondistended, soft and nontender. No organomegaly or masses felt. Normal bowel sounds heard. Central nervous system: Alert and oriented. No focal neurological deficits. Extremities: Symmetric 5 x 5 power. Skin: No rashes, lesions or ulcers Psychiatry: Judgement and insight appear normal. Mood & affect appropriate.     Data Reviewed: I have personally reviewed following labs and imaging studies  CBC: Recent Labs  Lab 08/29/21 1038 08/30/21 0452 08/31/21 0734  WBC 15.4* 15.9* 21.6*  NEUTROABS  --   --  19.2*  HGB 15.0 13.6 13.4  HCT 46.1* 40.1 40.6  MCV 90.2 87.6 87.1  PLT 232 282 762   Basic Metabolic Panel: Recent Labs  Lab 08/29/21 1038 08/30/21 0452 08/31/21 0734  NA 132* 137 137  K 3.7 3.7 3.5  CL 100 102 101  CO2 21* 25 26  GLUCOSE 116* 176* 145*  BUN 13 24* 26*  CREATININE 0.83 1.10* 1.07*  CALCIUM 9.6 10.0 11.0*   GFR: Estimated Creatinine Clearance: 34.5 mL/min (A) (by C-G formula based on SCr of 1.07 mg/dL (H)). Liver Function Tests: No results for input(s): AST, ALT, ALKPHOS, BILITOT, PROT, ALBUMIN in the last 168 hours. No results for input(s): LIPASE, AMYLASE in the last 168 hours. No results for input(s): AMMONIA in the last 168 hours. Coagulation Profile: Recent Labs  Lab 08/29/21 1724  INR 1.2   Cardiac Enzymes: No results for input(s): CKTOTAL, CKMB, CKMBINDEX, TROPONINI in the last 168 hours. BNP (last 3 results) No results for input(s): PROBNP in the last 8760 hours. HbA1C: No results  for input(s): HGBA1C in the last 72 hours. CBG: No results for input(s): GLUCAP in the last 168 hours. Lipid Profile: No results for input(s): CHOL, HDL, LDLCALC, TRIG, CHOLHDL, LDLDIRECT in the last 72 hours. Thyroid Function Tests: No results for input(s): TSH, T4TOTAL, FREET4, T3FREE, THYROIDAB in the last 72 hours. Anemia Panel: No results for input(s): VITAMINB12, FOLATE, FERRITIN, TIBC, IRON, RETICCTPCT in the last 72 hours. Sepsis Labs: Recent Labs  Lab 08/29/21 1724 08/29/21 1751 08/30/21 0452 08/30/21 0745 08/31/21 0734  PROCALCITON 0.48  --  0.72  --   --   LATICACIDVEN 1.7 2.3*  --  2.3* 2.1*    Recent Results (from the past 240 hour(s))  Resp Panel by RT-PCR (Flu A&B, Covid) Nasopharyngeal Swab     Status: None   Collection Time: 08/29/21 10:39 AM   Specimen: Nasopharyngeal Swab; Nasopharyngeal(NP) swabs in vial transport medium  Result Value Ref Range Status   SARS Coronavirus 2 by RT PCR NEGATIVE NEGATIVE Final    Comment: (NOTE) SARS-CoV-2 target nucleic acids are NOT DETECTED.  The SARS-CoV-2 RNA is generally detectable in upper respiratory specimens during the acute phase of infection. The lowest concentration of SARS-CoV-2 viral copies this assay can detect is 138 copies/mL. A negative result does not preclude SARS-Cov-2 infection and should not be used as the sole basis  for treatment or other patient management decisions. A negative result may occur with  improper specimen collection/handling, submission of specimen other than nasopharyngeal swab, presence of viral mutation(s) within the areas targeted by this assay, and inadequate number of viral copies(<138 copies/mL). A negative result must be combined with clinical observations, patient history, and epidemiological information. The expected result is Negative.  Fact Sheet for Patients:  EntrepreneurPulse.com.au  Fact Sheet for Healthcare Providers:   IncredibleEmployment.be  This test is no t yet approved or cleared by the [place] FDA and  has been authorized for detection and/or diagnosis of SARS-CoV-2 by FDA under an Emergency Use Authorization (EUA). This EUA will remain  in effect (meaning this test can be used) for the duration of the COVID-19 declaration under Section 564(b)(1) of the Act, 21 U.S.C.section 360bbb-3(b)(1), unless the authorization is terminated  or revoked sooner.       Influenza A by PCR NEGATIVE NEGATIVE Final   Influenza B by PCR NEGATIVE NEGATIVE Final    Comment: (NOTE) The Xpert Xpress SARS-CoV-2/FLU/RSV plus assay is intended as an aid in the diagnosis of influenza from Nasopharyngeal swab specimens and should not be used as a sole basis for treatment. Nasal washings and aspirates are unacceptable for Xpert Xpress SARS-CoV-2/FLU/RSV testing.  Fact Sheet for Patients: EntrepreneurPulse.com.au  Fact Sheet for Healthcare Providers: IncredibleEmployment.be  This test is not yet approved or cleared by the [place] FDA and has been authorized for detection and/or diagnosis of SARS-CoV-2 by FDA under an Emergency Use Authorization (EUA). This EUA will remain in effect (meaning this test can be used) for the duration of the COVID-19 declaration under Section 564(b)(1) of the Act, 21 U.S.C. section 360bbb-3(b)(1), unless the authorization is terminated or revoked.  Performed at [facility], [address] [place], [place] [address]   Blood Culture (routine x 2)     Status: None (Preliminary result)   Collection Time: 08/29/21  5:24 PM   Specimen: BLOOD  Result Value Ref Range Status   Specimen Description BLOOD RIGHT ARM  Final   Special Requests   Final    BOTTLES DRAWN AEROBIC AND ANAEROBIC Blood Culture results may not be optimal due to an inadequate volume of blood received in culture bottles   Culture   Final    NO  GROWTH 2 DAYS Performed at [facility], [address], [place], [place] [address]    Report Status PENDING  Incomplete  Blood Culture (routine x 2)     Status: None (Preliminary result)   Collection Time: 08/29/21  5:24 PM   Specimen: BLOOD  Result Value Ref Range Status   Specimen Description BLOOD LEFT ARM  Final   Special Requests   Final    BOTTLES DRAWN AEROBIC AND ANAEROBIC Blood Culture results may not be optimal due to an inadequate volume of blood received in culture bottles   Culture   Final    NO GROWTH 2 DAYS Performed at [facility], [address], [place], [place] [address]    Report Status PENDING  Incomplete  MRSA Next Gen by PCR, Nasal     Status: None   Collection Time: 08/30/21 10:09 AM   Specimen: Nasal Mucosa; Nasal Swab  Result Value Ref Range Status   MRSA by PCR Next Gen NOT DETECTED NOT DETECTED Final    Comment: (NOTE) The GeneXpert MRSA Assay (FDA approved for NASAL specimens only), is one component of a comprehensive MRSA colonization surveillance program. It is  not intended to diagnose MRSA infection nor to guide or monitor treatment for MRSA infections. Test performance is not FDA approved in patients less than 89 years old. Performed at [facility], [address], [place], [place] [address]          Radiology Studies: No results found.      Scheduled Meds:  brimonidine-timolol  1 drop Right Eye BID   enoxaparin (LOVENOX) injection  40 mg Subcutaneous Q24H   guaiFENesin  600 mg Oral BID   icosapent Ethyl  2 g Oral BID WC   ipratropium-albuterol  3 mL Nebulization Q6H   irbesartan  150 mg Oral Daily   levothyroxine  112 mcg Oral Q0600   methylPREDNISolone (SOLU-MEDROL) injection  40 mg Intravenous Q8H   metoprolol succinate  25 mg Oral Daily   mometasone-formoterol  2 puff Inhalation BID   Netarsudil Dimesylate  1 drop Right Eye QHS   Prednisolon-Moxiflox-Bromfenac  1 drop Right Eye QID    Continuous Infusions:  azithromycin 500 mg (08/30/21 1728)   ceFEPime (MAXIPIME) IV 2 g (08/31/21 0410)     LOS: 0 days    Time spent: 25 minutes    Sidney Ace, MD [facility]   If 7PM-7AM, please contact night-coverage  08/31/2021, 11:46 AM

## 2021-08-31 NOTE — Progress Notes (Signed)
Initial Nutrition Assessment  DOCUMENTATION CODES:   Obesity unspecified  INTERVENTION:   Ensure Enlive po BID, each supplement provides 350 kcal and 20 grams of protein  MVI po daily   Liberalize diet   NUTRITION DIAGNOSIS:   Increased nutrient needs related to catabolic illness (COPD) as evidenced by estimated needs.  GOAL:   Patient will meet greater than or equal to 90% of their needs  MONITOR:   PO intake, Supplement acceptance, Labs, Weight trends, Skin, I & O's  REASON FOR ASSESSMENT:   Malnutrition Screening Tool    ASSESSMENT:   86 y/o female with h/o CHF, COPD, hypothyroidism, NSTEMI, GERD and OSA who is admitted with PNA  Met with pt in room today. Pt reports good appetite and oral intake at baseline but reports that her appetite was decreased for 3-4 days pta. Pt reports that her appetite is fair in hospital. Pt documented to be eating 50-100% of meals. RD discussed with pt the importance of adequate nutrition needed to preserve lean muscle. Pt is willing to try strawberry Ensure in hospital. RD will add supplements and MVI to help pt meet her estimated needs. RD will also liberalize pt's diet as a heart healthy diet is restrictive of protein. Per chart, pt appears weight stable at baseline.   Medications reviewed and include: lovenox, synthroid, solu-medrol, azithromycin, cefepime   Labs reviewed: K 3.5 wnl, BUN 26(H), creat 1.07(H) Wbc- 21.6(H)  NUTRITION - FOCUSED PHYSICAL EXAM:  Flowsheet Row Most Recent Value  Orbital Region No depletion  Upper Arm Region No depletion  Thoracic and Lumbar Region No depletion  Buccal Region No depletion  Temple Region Mild depletion  Clavicle Bone Region Mild depletion  Clavicle and Acromion Bone Region Mild depletion  Scapular Bone Region No depletion  Dorsal Hand Moderate depletion  Patellar Region Moderate depletion  Anterior Thigh Region Moderate depletion  Posterior Calf Region Moderate depletion  Edema  (RD Assessment) None  Hair Reviewed  Eyes Reviewed  Mouth Reviewed  Skin Reviewed  Nails Reviewed   Diet Order:   Diet Order             Diet Heart Room service appropriate? Yes; Fluid consistency: Thin  Diet effective now                  EDUCATION NEEDS:   Education needs have been addressed  Skin:  Skin Assessment: Reviewed RN Assessment  Last BM:  11/5  Height:   Ht Readings from Last 1 Encounters:  08/29/21 5' (1.524 m)    Weight:   Wt Readings from Last 1 Encounters:  08/31/21 72.7 kg    Ideal Body Weight:  45.45 kg  BMI:  Body mass index is 31.3 kg/m.  Estimated Nutritional Needs:   Kcal:  1500-1700kcal/day  Protein:  75-85g/day  Fluid:  1.2-1.4L/day  Koleen Distance MS, RD, LDN Please refer to [facility] for RD and/or RD on-call/weekend/after hours pager

## 2021-09-01 MED ORDER — RHOPRESSA 0.02 % OP SOLN
1.0000 [drp] | Freq: Every day | OPHTHALMIC | 0 refills | Status: DC
Start: 2021-09-01 — End: 2021-10-27

## 2021-09-01 MED ORDER — AMOXICILLIN-POT CLAVULANATE 875-125 MG PO TABS: 1.0000 | ORAL_TABLET | Freq: Two times a day (BID) | ORAL | 0 refills | Status: AC

## 2021-09-01 MED ORDER — FUROSEMIDE 40 MG PO TABS: 20.0000 mg | ORAL_TABLET | Freq: Every day | ORAL | 3 refills | Status: DC

## 2021-09-01 MED ORDER — PREDNISOLON-MOXIFLOX-BROMFENAC 1-0.5-0.075 % OP SOLN: 1.0000 [drp] | Freq: Four times a day (QID) | OPHTHALMIC | 0 refills | Status: DC

## 2021-09-01 MED ORDER — BRIMONIDINE TARTRATE-TIMOLOL 0.2-0.5 % OP SOLN: 1.0000 [drp] | Freq: Two times a day (BID) | OPHTHALMIC | 0 refills | Status: DC

## 2021-09-01 NOTE — Discharge Summary (Addendum)
Andrea Phillips YQM:[id number] DOB: [birth year] DOA: 08/29/2021  PCP: Perrin Maltese, MD  Admit date: 08/29/2021 Discharge date: 09/01/2021  Time spent: 35 minutes  Recommendations for Outpatient Follow-up:  Pcp hospital f/u     Discharge Diagnoses:  Principal Problem:   Multifocal pneumonia Active Problems:   Essential hypertension   Frequent PVCs   OSA (obstructive sleep apnea)   NSTEMI (non-ST elevated myocardial infarction) ([place])   Hypothyroidism   Hyperlipidemia, mixed   CAP (community acquired pneumonia)   Discharge Condition: stable  Diet recommendation: heart healthy  Filed Weights   08/29/21 1036 08/29/21 2206 08/31/21 1422  Weight: 72.6 kg 71.6 kg 72.7 kg    History of present illness:   Andrea Phillips is a 86 y.o. female with medical history significant for hyperlipidemia, hypothyroid, hypertension, COPD, history of cataract surgery, GERD, who presents emergency department for chief concerns of cough and shortness of breath.   She reports that her symptoms started around Wednesday, 08/25/2021, with shortness of breath and new cough that is productive of light yellow phlegm.  She further endorses that diarrhea started Wednesday or Thursday. She reports the stool is loose and goes multiple times per day.  She denies changes to her diet.  She does endorse loss of appetite.   She denies dysuria, nausea, vomiting, syncope, lost of consciousness. She denies abdominal pain, swelling her legs.  She denies muscle aches.  She denies fever, headaches, speech difficulty.  She does endorse feeling 'foggy' since Wednesday.   She sees her grandchildren everyday, 37 month, 20 yo, 52 year, 84 year old.  Per patient, they all recently had upper respiratory infections and symptoms.  Hospital Course:  Patient presented with community acquired pneumonia, complaining of cough and shortness of breath. CXR with multifocal pneumonia. Covid negative. Met sepsis criteria by tachypnea,  tachycardia, leukocytosis. Treated with cefepime and azithromycin for 4 days. Will plan on transition to augmentin to complete 10 day course. Given sig wheeze corticosteroids initiated and will plan on completing 7 day course of those. Blood cultures negative. Initially requiring 2 L [place] O2 but weaned off, on day of discharge o2 95% on room air. Patient with corneal edema, scheduled for outpt eye surgery 11/8, will need close f/u with ophtho (dr. George Ina) for that surgery. Ophtho was consulted while inpatient and several eye drops ordered which we will continue as outpt. Patient presented complaining of diarrhea but no diarrhea during her hospitalization. She has a hx of hfref; here she appeared euvolemic. Patient ambulated w/ mobility specialist and tolerated that well w/o hypoxia.   Procedures: none   Consultations: ophthalmology  Discharge Exam: Vitals:   09/01/21 0738 09/01/21 0822  BP: (!) 163/82   Pulse: 87   Resp: 20   Temp: 97.6 F (36.4 C)   SpO2: 96% 94%    General: NAD Cardiovascular: RRR Respiratory: scattered rhonchi and wheeze throughout Abd: soft, non-tender Ext: no edema  Discharge Instructions   Discharge Instructions     Diet - low sodium heart healthy   Complete by: As directed    Increase activity slowly   Complete by: As directed       Allergies as of 09/01/2021       Reactions   Aspirin Hives   Other Hives   Yellow dye 6 FOOD COLOR YELLOW POWDER   Atorvastatin Other (See Comments)   unknown   Codeine Other (See Comments)   Doesn't know   Conj Estrog-medroxyprogest Ace Other (See Comments)   PREMPRO  0.3-1.5 MG ORAL TABLET unknown   Prednisone Other (See Comments)   Chest pain   Raloxifene Other (See Comments)   EVISTA 60 MG ORAL TABLET unknown   Ace Inhibitors Cough   Amlodipine Itching   Valsartan Itching        Medication List     STOP taking these medications    guaiFENesin 600 MG 12 hr tablet Commonly known as: MUCINEX        TAKE these medications    albuterol 108 (90 Base) MCG/ACT inhaler Commonly known as: VENTOLIN HFA Inhale 2 puffs into the lungs every 6 (six) hours as needed for wheezing or shortness of breath.   amoxicillin-clavulanate 875-125 MG tablet Commonly known as: Augmentin Take 1 tablet by mouth 2 (two) times daily for 6 days.   brimonidine-timolol 0.2-0.5 % ophthalmic solution Commonly known as: COMBIGAN Place 1 drop into the right eye 2 (two) times daily.   cetirizine 10 MG tablet Commonly known as: ZYRTEC Take 10 mg by mouth daily.   Dulera 200-5 MCG/ACT Aero Generic drug: mometasone-formoterol Inhale 2 puffs into the lungs 2 (two) times daily.   fluocinonide 0.05 % external solution Commonly known as: LIDEX Apply 1 application topically once a week.   fluticasone 50 MCG/ACT nasal spray Commonly known as: FLONASE Place 1 spray into both nostrils 2 (two) times daily as needed for allergies or rhinitis.   furosemide 40 MG tablet Commonly known as: Lasix Take 0.5 tablets (20 mg total) by mouth daily.   icosapent Ethyl 1 g capsule Commonly known as: VASCEPA Take 2 g by mouth 2 (two) times daily with a meal.   levothyroxine 112 MCG tablet Commonly known as: SYNTHROID Take 112 mcg by mouth daily.   meclizine 25 MG tablet Commonly known as: ANTIVERT Take 25 mg by mouth 2 (two) times daily as needed for dizziness.   metoprolol succinate 50 MG 24 hr tablet Commonly known as: TOPROL-XL Take 1 tablet (50 mg total) by mouth daily. Take with or immediately following a meal. What changed: how much to take   omeprazole 20 MG tablet Commonly known as: PriLOSEC OTC Take 2 tablets (40 mg total) by mouth daily. What changed:  when to take this reasons to take this   Prednisolon-Moxiflox-Bromfenac 1-0.5-0.075 % Soln Place 1 drop into the right eye 4 (four) times daily.   Rhopressa 0.02 % Soln Generic drug: Netarsudil Dimesylate Place 1 drop into the right eye at  bedtime.   telmisartan 40 MG tablet Commonly known as: MICARDIS Take 80 mg by mouth daily.       Allergies  Allergen Reactions   Aspirin Hives   Other Hives    Yellow dye 6 FOOD COLOR YELLOW POWDER    Atorvastatin Other (See Comments)    unknown   Codeine Other (See Comments)    Doesn't know   Conj Estrog-Medroxyprogest Ace Other (See Comments)    PREMPRO 0.3-1.5 MG ORAL TABLET unknown   Prednisone Other (See Comments)    Chest pain   Raloxifene Other (See Comments)    EVISTA 60 MG ORAL TABLET unknown   Ace Inhibitors Cough   Amlodipine Itching   Valsartan Itching    Follow-up Information     Perrin Maltese, MD Follow up.   Specialty: Internal Medicine Contact information: [place] [address] [phone]         Birder Robson, MD Follow up.   Specialty: Ophthalmology Contact information: [address] [place] [address] [phone]  The results of significant diagnostics from this hospitalization (including imaging, microbiology, ancillary and laboratory) are listed below for reference.    Significant Diagnostic Studies: DG Chest 2 View  Result Date: 08/29/2021 CLINICAL DATA:  Chest pain and shortness of breath. EXAM: CHEST - 2 VIEW COMPARISON:  May 01, 2021 FINDINGS: Tortuosity and calcific atherosclerotic disease of the aorta. Cardiomediastinal silhouette is normal. Mediastinal contours appear intact. Streaky airspace opacities are present in the right lower lobe, and less likely in the left lower lobe and right middle lobe. Osseous structures are without acute abnormality. Soft tissues are grossly normal. IMPRESSION: Streaky airspace opacities in the right lower lobe, and less likely in the left lower lobe and right middle lobe. Findings may be seen with multifocal pneumonia, including atypical/viral pneumonia. Electronically Signed   By: Fidela Salisbury M.D.   On: 08/29/2021 11:31     Microbiology: Recent Results (from the past 240 hour(s))  Resp Panel by RT-PCR (Flu A&B, Covid) Nasopharyngeal Swab     Status: None   Collection Time: 08/29/21 10:39 AM   Specimen: Nasopharyngeal Swab; Nasopharyngeal(NP) swabs in vial transport medium  Result Value Ref Range Status   SARS Coronavirus 2 by RT PCR NEGATIVE NEGATIVE Final    Comment: (NOTE) SARS-CoV-2 target nucleic acids are NOT DETECTED.  The SARS-CoV-2 RNA is generally detectable in upper respiratory specimens during the acute phase of infection. The lowest concentration of SARS-CoV-2 viral copies this assay can detect is 138 copies/mL. A negative result does not preclude SARS-Cov-2 infection and should not be used as the sole basis for treatment or other patient management decisions. A negative result may occur with  improper specimen collection/handling, submission of specimen other than nasopharyngeal swab, presence of viral mutation(s) within the areas targeted by this assay, and inadequate number of viral copies(<138 copies/mL). A negative result must be combined with clinical observations, patient history, and epidemiological information. The expected result is Negative.  Fact Sheet for Patients:  EntrepreneurPulse.com.au  Fact Sheet for Healthcare Providers:  IncredibleEmployment.be  This test is no t yet approved or cleared by the [place] FDA and  has been authorized for detection and/or diagnosis of SARS-CoV-2 by FDA under an Emergency Use Authorization (EUA). This EUA will remain  in effect (meaning this test can be used) for the duration of the COVID-19 declaration under Section 564(b)(1) of the Act, 21 U.S.C.section 360bbb-3(b)(1), unless the authorization is terminated  or revoked sooner.       Influenza A by PCR NEGATIVE NEGATIVE Final   Influenza B by PCR NEGATIVE NEGATIVE Final    Comment: (NOTE) The Xpert Xpress SARS-CoV-2/FLU/RSV plus assay is  intended as an aid in the diagnosis of influenza from Nasopharyngeal swab specimens and should not be used as a sole basis for treatment. Nasal washings and aspirates are unacceptable for Xpert Xpress SARS-CoV-2/FLU/RSV testing.  Fact Sheet for Patients: EntrepreneurPulse.com.au  Fact Sheet for Healthcare Providers: IncredibleEmployment.be  This test is not yet approved or cleared by the [place] FDA and has been authorized for detection and/or diagnosis of SARS-CoV-2 by FDA under an Emergency Use Authorization (EUA). This EUA will remain in effect (meaning this test can be used) for the duration of the COVID-19 declaration under Section 564(b)(1) of the Act, 21 U.S.C. section 360bbb-3(b)(1), unless the authorization is terminated or revoked.  Performed at [facility], [address] [place], [place] [address]   Blood Culture (routine x 2)     Status: None (Preliminary result)   Collection  Time: 08/29/21  5:24 PM   Specimen: BLOOD  Result Value Ref Range Status   Specimen Description BLOOD RIGHT ARM  Final   Special Requests   Final    BOTTLES DRAWN AEROBIC AND ANAEROBIC Blood Culture results may not be optimal due to an inadequate volume of blood received in culture bottles   Culture   Final    NO GROWTH 3 DAYS Performed at [facility], [address], [place], [place] [address]    Report Status PENDING  Incomplete  Blood Culture (routine x 2)     Status: None (Preliminary result)   Collection Time: 08/29/21  5:24 PM   Specimen: BLOOD  Result Value Ref Range Status   Specimen Description BLOOD LEFT ARM  Final   Special Requests   Final    BOTTLES DRAWN AEROBIC AND ANAEROBIC Blood Culture results may not be optimal due to an inadequate volume of blood received in culture bottles   Culture   Final    NO GROWTH 3 DAYS Performed at [facility], [address] [place], [place] [address]    Report  Status PENDING  Incomplete  MRSA Next Gen by PCR, Nasal     Status: None   Collection Time: 08/30/21 10:09 AM   Specimen: Nasal Mucosa; Nasal Swab  Result Value Ref Range Status   MRSA by PCR Next Gen NOT DETECTED NOT DETECTED Final    Comment: (NOTE) The GeneXpert MRSA Assay (FDA approved for NASAL specimens only), is one component of a comprehensive MRSA colonization surveillance program. It is not intended to diagnose MRSA infection nor to guide or monitor treatment for MRSA infections. Test performance is not FDA approved in patients less than 44 years old. Performed at [facility], [address] [place], [place] [address]      Labs: Basic Metabolic Panel: Recent Labs  Lab 08/29/21 1038 08/30/21 0452 08/31/21 0734  NA 132* 137 137  K 3.7 3.7 3.5  CL 100 102 101  CO2 21* 25 26  GLUCOSE 116* 176* 145*  BUN 13 24* 26*  CREATININE 0.83 1.10* 1.07*  CALCIUM 9.6 10.0 11.0*   Liver Function Tests: No results for input(s): AST, ALT, ALKPHOS, BILITOT, PROT, ALBUMIN in the last 168 hours. No results for input(s): LIPASE, AMYLASE in the last 168 hours. No results for input(s): AMMONIA in the last 168 hours. CBC: Recent Labs  Lab 08/29/21 1038 08/30/21 0452 08/31/21 0734  WBC 15.4* 15.9* 21.6*  NEUTROABS  --   --  19.2*  HGB 15.0 13.6 13.4  HCT 46.1* 40.1 40.6  MCV 90.2 87.6 87.1  PLT 232 282 382   Cardiac Enzymes: No results for input(s): CKTOTAL, CKMB, CKMBINDEX, TROPONINI in the last 168 hours. BNP: BNP (last 3 results) Recent Labs    04/01/21 2246 08/29/21 1738  BNP 559.2* 408.8*    ProBNP (last 3 results) No results for input(s): PROBNP in the last 8760 hours.  CBG: No results for input(s): GLUCAP in the last 168 hours.     Signed:  Desma Maxim MD.  [facility] 09/01/2021, 1:10 PM

## 2021-09-01 NOTE — TOC CM/SW Note (Signed)
Patient has orders to discharge home today. Chart reviewed. PCP is Yves Dill, MD. Per note from mobility tech yesterday, patient's oxygen saturations were greater than 90% on room air with mobility. No wound care needs. No TOC needs identified. CSW signing off.  Charlynn Court, CSW [phone]

## 2021-09-01 NOTE — Progress Notes (Signed)
Patient to be discharged to home via wheelchair.  Patient discharged with all pertinent information, prescription, and personal belongings. Patient able to teach back information, questions answered.  IV site d/ced.  No acute distress noted.  Care relinquished.

## 2021-09-03 LAB — CULTURE, BLOOD (ROUTINE X 2)
Culture: NO GROWTH
Culture: NO GROWTH

## 2021-09-07 ENCOUNTER — Ambulatory Visit
Admission: RE | Admit: 2021-09-07 | Discharge: 2021-09-07 | Disposition: A | Discharge status: Home / Self Care | Payer: Medicare Other | Attending: Ophthalmology | Admitting: Ophthalmology

## 2021-09-07 ENCOUNTER — Encounter
Admission: RE | Disposition: A | Discharge status: Home / Self Care | Payer: Self-pay | Source: Home / Self Care | Attending: Ophthalmology

## 2021-09-07 ENCOUNTER — Ambulatory Visit: Payer: Medicare Other | Admitting: Anesthesiology

## 2021-09-07 ENCOUNTER — Encounter: Payer: Self-pay | Admitting: Ophthalmology

## 2021-09-07 ENCOUNTER — Other Ambulatory Visit: Payer: Self-pay

## 2021-09-07 DIAGNOSIS — H2511 Age-related nuclear cataract, right eye: Secondary | ICD-10-CM | POA: Diagnosis not present

## 2021-09-07 DIAGNOSIS — I509 Heart failure, unspecified: Secondary | ICD-10-CM | POA: Diagnosis not present

## 2021-09-07 DIAGNOSIS — I110 Hypertensive heart disease with heart failure: Secondary | ICD-10-CM | POA: Diagnosis not present

## 2021-09-07 HISTORY — PX: CATARACT EXTRACTION W/PHACO: SHX586

## 2021-09-07 SURGERY — PHACOEMULSIFICATION, CATARACT, WITH IOL INSERTION
Anesthesia: Monitor Anesthesia Care | Site: Eye | Laterality: Right

## 2021-09-07 MED ORDER — SIGHTPATH DOSE#1 BSS IO SOLN
INTRAOCULAR | Status: DC | PRN
  Administered 2021-09-07: 15 mL via INTRAOCULAR

## 2021-09-07 MED ORDER — CEFUROXIME OPHTHALMIC INJECTION 1 MG/0.1 ML
INJECTION | OPHTHALMIC | Status: DC | PRN
  Administered 2021-09-07: 0.1 mL via INTRACAMERAL

## 2021-09-07 MED ORDER — FENTANYL CITRATE (PF) 100 MCG/2ML IJ SOLN
INTRAMUSCULAR | Status: DC | PRN
  Administered 2021-09-07: 50 ug via INTRAVENOUS

## 2021-09-07 MED ORDER — SIGHTPATH DOSE#1 BSS IO SOLN
INTRAOCULAR | Status: DC | PRN
  Administered 2021-09-07: 2 mL

## 2021-09-07 MED ORDER — MIDAZOLAM HCL 2 MG/2ML IJ SOLN
INTRAMUSCULAR | Status: DC | PRN
  Administered 2021-09-07: 1 mg via INTRAVENOUS

## 2021-09-07 MED ORDER — BRIMONIDINE TARTRATE-TIMOLOL 0.2-0.5 % OP SOLN
OPHTHALMIC | Status: DC | PRN
  Administered 2021-09-07: 1 [drp] via OPHTHALMIC

## 2021-09-07 MED ORDER — TETRACAINE HCL 0.5 % OP SOLN
1.0000 [drp] | OPHTHALMIC | Status: DC | PRN
  Administered 2021-09-07 (×3): 1 [drp] via OPHTHALMIC

## 2021-09-07 MED ORDER — SIGHTPATH DOSE#1 BSS IO SOLN
INTRAOCULAR | Status: DC | PRN
  Administered 2021-09-07: 17 mL via OPHTHALMIC

## 2021-09-07 MED ORDER — SIGHTPATH DOSE#1 NA CHONDROIT SULF-NA HYALURON 40-17 MG/ML IO SOLN
INTRAOCULAR | Status: DC | PRN
  Administered 2021-09-07: 1 mL via INTRAOCULAR

## 2021-09-07 SURGICAL SUPPLY — 11 items
CANNULA ANT/CHMB 27GA (MISCELLANEOUS) IMPLANT
GLOVE SURG ENC TEXT LTX SZ8 (GLOVE) ×3 IMPLANT
GLOVE SURG TRIUMPH 8.0 PF LTX (GLOVE) ×3 IMPLANT
GOWN STRL REUS W/ TWL LRG LVL3 (GOWN DISPOSABLE) ×2 IMPLANT
GOWN STRL REUS W/TWL LRG LVL3 (GOWN DISPOSABLE) ×6
NEEDLE FILTER BLUNT 18X 1/2SAF (NEEDLE) ×2
NEEDLE FILTER BLUNT 18X1 1/2 (NEEDLE) ×1 IMPLANT
PACK EYE AFTER SURG (MISCELLANEOUS) IMPLANT
SYR 3ML LL SCALE MARK (SYRINGE) ×3 IMPLANT
SYR TB 1ML LUER SLIP (SYRINGE) ×3 IMPLANT
WATER STERILE IRR 250ML POUR (IV SOLUTION) ×3 IMPLANT

## 2021-09-07 NOTE — Anesthesia Procedure Notes (Signed)
Procedure Name: MAC Date/Time: 09/07/2021 12:34 PM Performed by: Jeannene Patella, CRNA Pre-anesthesia Checklist: Patient identified, Emergency Drugs available, Suction available, Timeout performed and Patient being monitored Patient Re-evaluated:Patient Re-evaluated prior to induction Oxygen Delivery Method: Nasal cannula Placement Confirmation: positive ETCO2

## 2021-09-07 NOTE — Anesthesia Postprocedure Evaluation (Signed)
Anesthesia Post Note  Patient: Andrea Phillips  Procedure(s) Performed: REMOVAL OF LENS FRAGMENTS RIGHT (Right: Eye)     Patient location during evaluation: PACU Anesthesia Type: MAC Level of consciousness: awake and alert Pain management: pain level controlled Vital Signs Assessment: post-procedure vital signs reviewed and stable Respiratory status: spontaneous breathing, nonlabored ventilation, respiratory function stable and patient connected to nasal cannula oxygen Cardiovascular status: stable and blood pressure returned to baseline Postop Assessment: no apparent nausea or vomiting Anesthetic complications: no   No notable events documented.  Trecia Rogers

## 2021-09-07 NOTE — Op Note (Signed)
PREOPERATIVE DIAGNOSIS:  Nuclear sclerotic cataract of the right eye.   POSTOPERATIVE DIAGNOSIS:ORPOSTDX@   OPERATIVE PROCEDURE:  Procedure(s): REMOVAL OF LENS FRAGMENTS RIGHT   SURGEON:  Galen Manila, MD.   ANESTHESIA:  Anesthesiologist: Baxter Flattery, MD CRNA: Jinny Blossom, CRNA  1.      Managed anesthesia care. 2.      Topical tetracaine drops followed by 2% Xylocaine jelly applied in the preoperative holding area.   COMPLICATIONS:  None.   TECHNIQUE:   I and A   DESCRIPTION OF PROCEDURE:  The patient was examined and consented in the preoperative holding area where the aforementioned topical anesthesia was applied to the right eye and then brought back to the Operating Room where the right eye was prepped and draped in the usual sterile ophthalmic fashion and a lid speculum was placed. A paracentesis was created with the side port blade and the anterior chamber was filled with viscoelastic.The near clear corneal incision was reopened with the steel  Koch spatula. The I and A handpiece was used to break up and aspirate the nuclear frag. The remaining viscoelastic was removed from the eye with the irrigation-aspiration handpiece. The wounds were hydrated. The anterior chamber was flushed with BSS and the eye was inflated to physiologic pressure. 0.1 mL of cefuroxime concentration 10 mg/mL was placed in the anterior chamber. The wounds were found to be water tight. The eye was dressed with Combigan. The patient was given protective glasses to wear throughout the day and a shield with which to sleep tonight. The patient was also given drops with which to begin a drop regimen today and will follow-up with me in one day. * No implants in log * Procedure(s): REMOVAL OF LENS FRAGMENTS RIGHT (Right)  Electronically signed: Galen Manila 09/07/2021 12:43 PM

## 2021-09-07 NOTE — H&P (Signed)
[facility]   Primary Care Physician:  Margaretann Loveless, MD Ophthalmologist: Dr. Druscilla Brownie  Pre-Procedure History & Physical: HPI:  Andrea Phillips is a 86 y.o. female here for cataract surgery.   Past Medical History:  Diagnosis Date   Arthritis    ra   Asthma    CHF (congestive heart failure) (HCC)    Dysrhythmia    Edema extremities    GERD (gastroesophageal reflux disease)    Gout    Headache    History of hiatal hernia    Hypertension    Hypothyroidism    Shortness of breath dyspnea    Sleep apnea     Past Surgical History:  Procedure Laterality Date   APPENDECTOMY     BACK SURGERY     CARDIAC CATHETERIZATION     CATARACT EXTRACTION W/PHACO Left 03/09/2015   Procedure: CATARACT EXTRACTION PHACO AND INTRAOCULAR LENS PLACEMENT (IOC);  Surgeon: Sallee Lange, MD;  Location: [facility] ORS;  Service: Ophthalmology;  Laterality: Left;  [place] 01:31 AP% 26.1 CDE 43.72   CATARACT EXTRACTION W/PHACO Right 08/19/2021   Procedure: CATARACT EXTRACTION PHACO AND INTRAOCULAR LENS PLACEMENT (IOC) RIGHT;  Surgeon: Galen Manila, MD;  Location: [facility] ORS;  Service: Ophthalmology;  Laterality: Right;  12.31 1:07.0   CHOLECYSTECTOMY     EYE SURGERY     retina   JOINT REPLACEMENT     left knee   LEFT HEART CATH AND CORONARY ANGIOGRAPHY N/A 04/02/2021   Procedure: LEFT HEART CATH AND CORONARY ANGIOGRAPHY;  Surgeon: Lamar Blinks, MD;  Location: [facility] INVASIVE CV LAB;  Service: Cardiovascular;  Laterality: N/A;    Prior to Admission medications   Medication Sig Start Date End Date Taking? Authorizing Provider  albuterol (VENTOLIN HFA) 108 (90 Base) MCG/ACT inhaler Inhale 2 puffs into the lungs every 6 (six) hours as needed for wheezing or shortness of breath.   Yes [provider]  amoxicillin-clavulanate (AUGMENTIN) 875-125 MG tablet Take 1 tablet by mouth 2 (two) times daily for 6 days. 09/01/21 09/07/21 Yes Wouk, Wilfred Curtis, MD  cetirizine (ZYRTEC) 10 MG tablet  Take 10 mg by mouth daily.   Yes [provider]  fluticasone (FLONASE) 50 MCG/ACT nasal spray Place 1 spray into both nostrils 2 (two) times daily as needed for allergies or rhinitis.   Yes [provider]  furosemide (LASIX) 40 MG tablet Take 0.5 tablets (20 mg total) by mouth daily. 09/01/21 09/01/22 Yes Wouk, Wilfred Curtis, MD  icosapent Ethyl (VASCEPA) 1 g capsule Take 2 g by mouth 2 (two) times daily with a meal.   Yes [provider]  levothyroxine (SYNTHROID) 112 MCG tablet Take 112 mcg by mouth daily. 02/23/21  Yes [provider]  meclizine (ANTIVERT) 25 MG tablet Take 25 mg by mouth 2 (two) times daily as needed for dizziness. 07/05/21  Yes [provider]  omeprazole (PRILOSEC OTC) 20 MG tablet Take 2 tablets (40 mg total) by mouth daily. Patient taking differently: Take 40 mg by mouth daily as needed (reflux). 04/04/21  Yes Almon Hercules, MD  Prednisolon-Moxiflox-Bromfenac 1-0.5-0.075 % SOLN Place 1 drop into the right eye 4 (four) times daily. 09/01/21  Yes Wouk, Wilfred Curtis, MD  telmisartan (MICARDIS) 40 MG tablet Take 80 mg by mouth daily. 03/06/21  Yes [provider]  brimonidine-timolol (COMBIGAN) 0.2-0.5 % ophthalmic solution Place 1 drop into the right eye 2 (two) times daily. 09/01/21   Wouk, Wilfred Curtis, MD  fluocinonide (LIDEX) 0.05 % external solution  Apply 1 application topically once a week.    [provider]  metoprolol succinate (TOPROL-XL) 50 MG 24 hr tablet Take 1 tablet (50 mg total) by mouth daily. Take with or immediately following a meal. Patient taking differently: Take 25 mg by mouth daily. Take with or immediately following a meal. 04/05/21   Gonfa, Boyce Medici, MD  mometasone-formoterol (DULERA) 200-5 MCG/ACT AERO Inhale 2 puffs into the lungs 2 (two) times daily. Patient not taking: No sig reported 04/04/21   Almon Hercules, MD  Netarsudil Dimesylate (RHOPRESSA) 0.02 % SOLN Place 1 drop into the right eye at  bedtime. 09/01/21   Wouk, Wilfred Curtis, MD    Allergies as of 08/20/2021 - Review Complete 08/20/2021  Allergen Reaction Noted   Aspirin Hives 03/05/2015   Other Hives 03/05/2015   Atorvastatin Other (See Comments) 04/09/2010   Codeine Other (See Comments) 04/02/2021   Conj estrog-medroxyprogest ace Other (See Comments) 10/02/2018   Prednisone Other (See Comments) 03/05/2015   Raloxifene Other (See Comments) 10/02/2018   Ace inhibitors Cough 01/12/2009   Amlodipine Itching 01/19/2016   Valsartan Itching 04/09/2010    Family History  Problem Relation Age of Onset   Breast cancer Cousin     Social History   Socioeconomic History   Marital status: Widowed    Spouse name: Not on file   Number of children: Not on file   Years of education: Not on file   Highest education level: Not on file  Occupational History   Not on file  Tobacco Use   Smoking status: Never   Smokeless tobacco: Never  Substance and Sexual Activity   Alcohol use: No   Drug use: No   Sexual activity: Not on file  Other Topics Concern   Not on file  Social History Narrative   Not on file   Social Determinants of Health   Financial Resource Strain: Not on file  Food Insecurity: Not on file  Transportation Needs: Not on file  Physical Activity: Not on file  Stress: Not on file  Social Connections: Not on file  Intimate Partner Violence: Not on file    Review of Systems: See HPI, otherwise negative ROS  Physical Exam: BP (!) 184/66   Pulse 63   Temp 97.8 F (36.6 C) (Temporal)   Wt 71.2 kg   SpO2 94%   BMI 30.66 kg/m  General:   Alert, cooperative in NAD Head:  Normocephalic and atraumatic. Respiratory:  Normal work of breathing. Cardiovascular:  RRR  Impression/Plan: Andrea Phillips is here for cataract surgery.  Risks, benefits, limitations, and alternatives regarding cataract surgery have been reviewed with the patient.  Questions have been answered.  All parties  agreeable.   Galen Manila, MD  09/07/2021, 12:22 PM

## 2021-09-07 NOTE — Anesthesia Preprocedure Evaluation (Addendum)
Anesthesia Evaluation  Patient identified by MRN, date of birth, ID band Patient awake    Reviewed: Allergy & Precautions, H&P , NPO status , Patient's Chart, lab work & pertinent test results, reviewed documented beta blocker date and time   Airway Mallampati: I  TM Distance: >3 FB Neck ROM: full    Dental no notable dental hx.    Pulmonary shortness of breath and with exertion, asthma , sleep apnea ,    Pulmonary exam normal breath sounds clear to auscultation       Cardiovascular Exercise Tolerance: Good hypertension, + Past MI and +CHF  Normal cardiovascular exam Rhythm:regular Rate:Normal     Neuro/Psych negative neurological ROS  negative psych ROS   GI/Hepatic Neg liver ROS, GERD  Controlled,  Endo/Other  Hypothyroidism   Renal/GU negative Renal ROS  negative genitourinary   Musculoskeletal   Abdominal   Peds  Hematology negative hematology ROS (+)   Anesthesia Other Findings   Reproductive/Obstetrics negative OB ROS                            Anesthesia Physical Anesthesia Plan  ASA: 3  Anesthesia Plan: MAC   Post-op Pain Management:    Induction:   PONV Risk Score and Plan:   Airway Management Planned:   Additional Equipment:   Intra-op Plan:   Post-operative Plan:   Informed Consent: I have reviewed the patients History and Physical, chart, labs and discussed the procedure including the risks, benefits and alternatives for the proposed anesthesia with the patient or authorized representative who has indicated his/her understanding and acceptance.     Dental Advisory Given  Plan Discussed with: CRNA and Anesthesiologist  Anesthesia Plan Comments:         Anesthesia Quick Evaluation

## 2021-09-07 NOTE — Transfer of Care (Signed)
Immediate Anesthesia Transfer of Care Note  Patient: Andrea Phillips  Procedure(s) Performed: REMOVAL OF LENS FRAGMENTS RIGHT (Right)  Patient Location: PACU  Anesthesia Type: MAC  Level of Consciousness: awake, alert  and patient cooperative  Airway and Oxygen Therapy: Patient Spontanous Breathing and Patient connected to supplemental oxygen  Post-op Assessment: Post-op Vital signs reviewed, Patient's Cardiovascular Status Stable, Respiratory Function Stable, Patent Airway and No signs of Nausea or vomiting  Post-op Vital Signs: Reviewed and stable  Complications: No notable events documented.

## 2021-09-08 ENCOUNTER — Encounter: Payer: Self-pay | Admitting: Ophthalmology

## 2021-09-28 ENCOUNTER — Other Ambulatory Visit: Payer: Self-pay | Admitting: Internal Medicine

## 2021-09-28 DIAGNOSIS — J189 Pneumonia, unspecified organism: Secondary | ICD-10-CM

## 2021-10-11 ENCOUNTER — Other Ambulatory Visit: Payer: Self-pay

## 2021-10-11 ENCOUNTER — Ambulatory Visit
Admission: RE | Admit: 2021-10-11 | Discharge: 2021-10-11 | Disposition: A | Discharge status: Home / Self Care | Payer: Medicare Other | Source: Ambulatory Visit | Attending: Internal Medicine | Admitting: Internal Medicine

## 2021-10-11 DIAGNOSIS — J189 Pneumonia, unspecified organism: Secondary | ICD-10-CM | POA: Insufficient documentation

## 2021-10-11 LAB — POCT I-STAT CREATININE: Creatinine, Ser: 0.8 mg/dL (ref 0.44–1.00)

## 2021-10-11 MED ORDER — IOHEXOL 300 MG/ML  SOLN
75.0000 mL | Freq: Once | INTRAMUSCULAR | Status: AC | PRN
  Administered 2021-10-11: 13:00:00 75 mL via INTRAVENOUS

## 2021-10-24 ENCOUNTER — Other Ambulatory Visit: Payer: Self-pay

## 2021-10-24 ENCOUNTER — Encounter: Payer: Self-pay | Admitting: Emergency Medicine

## 2021-10-24 ENCOUNTER — Inpatient Hospital Stay
Admission: EM | Admit: 2021-10-24 | Discharge: 2021-10-27 | DRG: 280 | Disposition: A | Discharge status: Home / Self Care | Payer: Medicare Other | Attending: Student in an Organized Health Care Education/Training Program | Admitting: Student in an Organized Health Care Education/Training Program

## 2021-10-24 ENCOUNTER — Emergency Department: Payer: Medicare Other

## 2021-10-24 ENCOUNTER — Encounter
Admission: EM | Disposition: A | Discharge status: Home / Self Care | Payer: Self-pay | Source: Home / Self Care | Attending: Internal Medicine

## 2021-10-24 DIAGNOSIS — M069 Rheumatoid arthritis, unspecified: Secondary | ICD-10-CM | POA: Diagnosis present

## 2021-10-24 DIAGNOSIS — E039 Hypothyroidism, unspecified: Secondary | ICD-10-CM | POA: Diagnosis present

## 2021-10-24 DIAGNOSIS — Z79899 Other long term (current) drug therapy: Secondary | ICD-10-CM | POA: Diagnosis not present

## 2021-10-24 DIAGNOSIS — G4733 Obstructive sleep apnea (adult) (pediatric): Secondary | ICD-10-CM | POA: Diagnosis present

## 2021-10-24 DIAGNOSIS — R778 Other specified abnormalities of plasma proteins: Secondary | ICD-10-CM | POA: Diagnosis present

## 2021-10-24 DIAGNOSIS — I2510 Atherosclerotic heart disease of native coronary artery without angina pectoris: Secondary | ICD-10-CM | POA: Diagnosis present

## 2021-10-24 DIAGNOSIS — I5022 Chronic systolic (congestive) heart failure: Secondary | ICD-10-CM | POA: Diagnosis present

## 2021-10-24 DIAGNOSIS — Z20822 Contact with and (suspected) exposure to covid-19: Secondary | ICD-10-CM | POA: Diagnosis present

## 2021-10-24 DIAGNOSIS — I5023 Acute on chronic systolic (congestive) heart failure: Secondary | ICD-10-CM | POA: Diagnosis present

## 2021-10-24 DIAGNOSIS — I214 Non-ST elevation (NSTEMI) myocardial infarction: Principal | ICD-10-CM | POA: Diagnosis present

## 2021-10-24 DIAGNOSIS — I259 Chronic ischemic heart disease, unspecified: Secondary | ICD-10-CM | POA: Diagnosis not present

## 2021-10-24 DIAGNOSIS — I110 Hypertensive heart disease with heart failure: Secondary | ICD-10-CM | POA: Diagnosis present

## 2021-10-24 DIAGNOSIS — Z888 Allergy status to other drugs, medicaments and biological substances status: Secondary | ICD-10-CM | POA: Diagnosis not present

## 2021-10-24 DIAGNOSIS — I5033 Acute on chronic diastolic (congestive) heart failure: Secondary | ICD-10-CM | POA: Diagnosis present

## 2021-10-24 DIAGNOSIS — J449 Chronic obstructive pulmonary disease, unspecified: Secondary | ICD-10-CM | POA: Diagnosis present

## 2021-10-24 DIAGNOSIS — E038 Other specified hypothyroidism: Secondary | ICD-10-CM | POA: Diagnosis not present

## 2021-10-24 DIAGNOSIS — Z8249 Family history of ischemic heart disease and other diseases of the circulatory system: Secondary | ICD-10-CM

## 2021-10-24 DIAGNOSIS — Z79890 Hormone replacement therapy (postmenopausal): Secondary | ICD-10-CM

## 2021-10-24 DIAGNOSIS — I10 Essential (primary) hypertension: Secondary | ICD-10-CM | POA: Diagnosis present

## 2021-10-24 DIAGNOSIS — Z28310 Unvaccinated for covid-19: Secondary | ICD-10-CM | POA: Diagnosis not present

## 2021-10-24 DIAGNOSIS — Z9114 Patient's other noncompliance with medication regimen: Secondary | ICD-10-CM | POA: Diagnosis not present

## 2021-10-24 DIAGNOSIS — Z886 Allergy status to analgesic agent status: Secondary | ICD-10-CM | POA: Diagnosis not present

## 2021-10-24 DIAGNOSIS — I493 Ventricular premature depolarization: Secondary | ICD-10-CM | POA: Diagnosis present

## 2021-10-24 DIAGNOSIS — K219 Gastro-esophageal reflux disease without esophagitis: Secondary | ICD-10-CM | POA: Diagnosis present

## 2021-10-24 DIAGNOSIS — T465X6A Underdosing of other antihypertensive drugs, initial encounter: Secondary | ICD-10-CM | POA: Diagnosis present

## 2021-10-24 DIAGNOSIS — R7989 Other specified abnormal findings of blood chemistry: Secondary | ICD-10-CM | POA: Diagnosis present

## 2021-10-24 DIAGNOSIS — E782 Mixed hyperlipidemia: Secondary | ICD-10-CM | POA: Diagnosis present

## 2021-10-24 DIAGNOSIS — Z91148 Patient's other noncompliance with medication regimen for other reason: Secondary | ICD-10-CM

## 2021-10-24 DIAGNOSIS — G894 Chronic pain syndrome: Secondary | ICD-10-CM | POA: Diagnosis present

## 2021-10-24 DIAGNOSIS — R079 Chest pain, unspecified: Secondary | ICD-10-CM

## 2021-10-24 DIAGNOSIS — I252 Old myocardial infarction: Secondary | ICD-10-CM

## 2021-10-24 DIAGNOSIS — M109 Gout, unspecified: Secondary | ICD-10-CM | POA: Diagnosis present

## 2021-10-24 HISTORY — PX: CORONARY/GRAFT ACUTE MI REVASCULARIZATION: CATH118305

## 2021-10-24 HISTORY — PX: LEFT HEART CATH AND CORONARY ANGIOGRAPHY: CATH118249

## 2021-10-24 LAB — TROPONIN I (HIGH SENSITIVITY)
Troponin I (High Sensitivity): 87 ng/L — ABNORMAL HIGH (ref <18)
Troponin I (High Sensitivity): >24000 ng/L (ref <18)

## 2021-10-24 LAB — CBC
HCT: 42.6 % (ref 36.0–46.0)
Hemoglobin: 14 g/dL (ref 12.0–15.0)
MCH: 29.9 pg (ref 26.0–34.0)
MCHC: 32.9 g/dL (ref 30.0–36.0)
MCV: 90.8 fL (ref 80.0–100.0)
Platelets: 288 10*3/uL (ref 150–400)
RBC: 4.69 MIL/uL (ref 3.87–5.11)
RDW: 14.4 % (ref 11.5–15.5)
WBC: 6.3 10*3/uL (ref 4.0–10.5)
nRBC: 0 % (ref 0.0–0.2)

## 2021-10-24 LAB — RESP PANEL BY RT-PCR (FLU A&B, COVID) ARPGX2
Influenza A by PCR: NEGATIVE
Influenza B by PCR: NEGATIVE
SARS Coronavirus 2 by RT PCR: NEGATIVE

## 2021-10-24 LAB — BASIC METABOLIC PANEL
Anion gap: 4 — ABNORMAL LOW (ref 5–15)
BUN: 20 mg/dL (ref 8–23)
CO2: 28 mmol/L (ref 22–32)
Calcium: 9.9 mg/dL (ref 8.9–10.3)
Chloride: 104 mmol/L (ref 98–111)
Creatinine, Ser: 0.94 mg/dL (ref 0.44–1.00)
GFR, Estimated: 60 mL/min — ABNORMAL LOW (ref >60)
Glucose, Bld: 117 mg/dL — ABNORMAL HIGH (ref 70–99)
Potassium: 3.9 mmol/L (ref 3.5–5.1)
Sodium: 136 mmol/L (ref 135–145)

## 2021-10-24 LAB — PROTIME-INR
INR: 1 (ref 0.8–1.2)
Prothrombin Time: 13 seconds (ref 11.4–15.2)

## 2021-10-24 LAB — D-DIMER, QUANTITATIVE
D-Dimer, Quant: 0.92 ug/mL-FEU — ABNORMAL HIGH (ref 0.00–0.50)
D-Dimer, Quant: 0.88 ug/mL-FEU — ABNORMAL HIGH (ref 0.00–0.50)

## 2021-10-24 LAB — PROCALCITONIN: Procalcitonin: <0.1 ng/mL

## 2021-10-24 LAB — BRAIN NATRIURETIC PEPTIDE: B Natriuretic Peptide: 456.9 pg/mL — ABNORMAL HIGH (ref 0.0–100.0)

## 2021-10-24 LAB — MAGNESIUM: Magnesium: 2.2 mg/dL (ref 1.7–2.4)

## 2021-10-24 LAB — APTT: aPTT: >200 seconds (ref 24–36)

## 2021-10-24 SURGERY — CORONARY/GRAFT ACUTE MI REVASCULARIZATION
Anesthesia: Moderate Sedation

## 2021-10-24 MED ORDER — HEPARIN SODIUM (PORCINE) 1000 UNIT/ML IJ SOLN
INTRAMUSCULAR | Status: DC | PRN
  Administered 2021-10-24: 3500 [IU] via INTRAVENOUS

## 2021-10-24 MED ORDER — CARVEDILOL 6.25 MG PO TABS: 12.5000 mg | ORAL_TABLET | Freq: Two times a day (BID) | ORAL | Status: DC

## 2021-10-24 MED ORDER — FENTANYL CITRATE PF 50 MCG/ML IJ SOSY
25.0000 ug | PREFILLED_SYRINGE | Freq: Once | INTRAMUSCULAR | Status: AC
Start: 2021-10-24 — End: 2021-10-24
  Administered 2021-10-24: 25 ug via INTRAVENOUS
  Filled 2021-10-24: qty 1

## 2021-10-24 MED ORDER — MECLIZINE HCL 25 MG PO TABS
25.0000 mg | ORAL_TABLET | Freq: Two times a day (BID) | ORAL | Status: DC | PRN
  Filled 2021-10-24: qty 1

## 2021-10-24 MED ORDER — HEPARIN BOLUS VIA INFUSION
3700.0000 [IU] | Freq: Once | INTRAVENOUS | Status: AC
  Administered 2021-10-24: 3700 [IU] via INTRAVENOUS
  Filled 2021-10-24: qty 3700

## 2021-10-24 MED ORDER — IOHEXOL 300 MG/ML  SOLN
INTRAMUSCULAR | Status: DC | PRN
  Administered 2021-10-24: 28 mL

## 2021-10-24 MED ORDER — MIDAZOLAM HCL 2 MG/2ML IJ SOLN
INTRAMUSCULAR | Status: AC
  Filled 2021-10-24: qty 2

## 2021-10-24 MED ORDER — NITROGLYCERIN 0.4 MG SL SUBL
0.4000 mg | SUBLINGUAL_TABLET | SUBLINGUAL | Status: DC | PRN
  Administered 2021-10-25: 0.4 mg via SUBLINGUAL
  Filled 2021-10-24 (×2): qty 1

## 2021-10-24 MED ORDER — MIDAZOLAM HCL 2 MG/2ML IJ SOLN
INTRAMUSCULAR | Status: DC | PRN
  Administered 2021-10-24: .5 mg via INTRAVENOUS

## 2021-10-24 MED ORDER — HYDRALAZINE HCL 20 MG/ML IJ SOLN
10.0000 mg | INTRAMUSCULAR | Status: AC | PRN
  Administered 2021-10-25: 10 mg via INTRAVENOUS
  Filled 2021-10-24: qty 1

## 2021-10-24 MED ORDER — NITROGLYCERIN 2 % TD OINT
0.5000 [in_us] | TOPICAL_OINTMENT | Freq: Once | TRANSDERMAL | Status: AC
Start: 2021-10-24 — End: 2021-10-24
  Administered 2021-10-24: 0.5 [in_us] via TOPICAL
  Filled 2021-10-24: qty 1

## 2021-10-24 MED ORDER — METOPROLOL SUCCINATE ER 25 MG PO TB24
25.0000 mg | ORAL_TABLET | Freq: Every day | ORAL | Status: DC
  Filled 2021-10-24: qty 1

## 2021-10-24 MED ORDER — LIDOCAINE HCL (PF) 1 % IJ SOLN
INTRAMUSCULAR | Status: DC | PRN
  Administered 2021-10-24: 2 mL

## 2021-10-24 MED ORDER — IRBESARTAN 150 MG PO TABS
300.0000 mg | ORAL_TABLET | Freq: Every day | ORAL | Status: DC
  Administered 2021-10-24 – 2021-10-27 (×4): 300 mg via ORAL
  Filled 2021-10-24 (×4): qty 2

## 2021-10-24 MED ORDER — SODIUM CHLORIDE 0.9% FLUSH
3.0000 mL | Freq: Two times a day (BID) | INTRAVENOUS | Status: DC
  Administered 2021-10-24 – 2021-10-27 (×3): 3 mL via INTRAVENOUS

## 2021-10-24 MED ORDER — ACETAMINOPHEN 325 MG PO TABS: 650.0000 mg | ORAL_TABLET | ORAL | Status: DC | PRN

## 2021-10-24 MED ORDER — LIDOCAINE HCL 1 % IJ SOLN
INTRAMUSCULAR | Status: AC
  Filled 2021-10-24: qty 20

## 2021-10-24 MED ORDER — HEPARIN (PORCINE) IN NACL 1000-0.9 UT/500ML-% IV SOLN
INTRAVENOUS | Status: AC
  Filled 2021-10-24: qty 1000

## 2021-10-24 MED ORDER — LABETALOL HCL 5 MG/ML IV SOLN
10.0000 mg | INTRAVENOUS | Status: AC | PRN
  Filled 2021-10-24: qty 4

## 2021-10-24 MED ORDER — HEPARIN SODIUM (PORCINE) 1000 UNIT/ML IJ SOLN
INTRAMUSCULAR | Status: AC
  Filled 2021-10-24: qty 10

## 2021-10-24 MED ORDER — CLOPIDOGREL BISULFATE 75 MG PO TABS
75.0000 mg | ORAL_TABLET | Freq: Every day | ORAL | Status: DC
  Administered 2021-10-25 – 2021-10-27 (×3): 75 mg via ORAL
  Filled 2021-10-24 (×3): qty 1

## 2021-10-24 MED ORDER — VERAPAMIL HCL 2.5 MG/ML IV SOLN
INTRAVENOUS | Status: AC
  Filled 2021-10-24: qty 2

## 2021-10-24 MED ORDER — VERAPAMIL HCL 2.5 MG/ML IV SOLN
INTRAVENOUS | Status: DC | PRN
  Administered 2021-10-24: 2.5 mg via INTRACORONARY

## 2021-10-24 MED ORDER — HEPARIN (PORCINE) 25000 UT/250ML-% IV SOLN
10.0000 [IU]/kg/h | INTRAVENOUS | Status: DC
Start: 2021-10-24 — End: 2021-10-24

## 2021-10-24 MED ORDER — CLOPIDOGREL BISULFATE 75 MG PO TABS
300.0000 mg | ORAL_TABLET | Freq: Once | ORAL | Status: AC
  Administered 2021-10-24: 300 mg via ORAL
  Filled 2021-10-24: qty 4

## 2021-10-24 MED ORDER — METOPROLOL SUCCINATE ER 25 MG PO TB24
25.0000 mg | ORAL_TABLET | Freq: Every day | ORAL | Status: DC
  Administered 2021-10-25 – 2021-10-27 (×3): 25 mg via ORAL
  Filled 2021-10-24 (×4): qty 1

## 2021-10-24 MED ORDER — HEPARIN (PORCINE) 25000 UT/250ML-% IV SOLN
750.0000 [IU]/h | INTRAVENOUS | Status: DC
  Administered 2021-10-24: 750 [IU]/h via INTRAVENOUS
  Filled 2021-10-24: qty 250

## 2021-10-24 MED ORDER — FENTANYL CITRATE (PF) 100 MCG/2ML IJ SOLN
INTRAMUSCULAR | Status: AC
  Filled 2021-10-24: qty 2

## 2021-10-24 MED ORDER — LEVOTHYROXINE SODIUM 112 MCG PO TABS
112.0000 ug | ORAL_TABLET | Freq: Every day | ORAL | Status: DC
  Administered 2021-10-25 – 2021-10-27 (×3): 112 ug via ORAL
  Filled 2021-10-24 (×3): qty 1

## 2021-10-24 MED ORDER — SODIUM CHLORIDE 0.9% FLUSH: 3.0000 mL | INTRAVENOUS | Status: DC | PRN

## 2021-10-24 MED ORDER — MOMETASONE FURO-FORMOTEROL FUM 200-5 MCG/ACT IN AERO
2.0000 | INHALATION_SPRAY | Freq: Two times a day (BID) | RESPIRATORY_TRACT | Status: DC
  Administered 2021-10-25 – 2021-10-27 (×5): 2 via RESPIRATORY_TRACT
  Filled 2021-10-24: qty 8.8

## 2021-10-24 MED ORDER — HEPARIN SODIUM (PORCINE) 5000 UNIT/ML IJ SOLN: 4000.0000 [IU] | Freq: Once | INTRAMUSCULAR | Status: DC

## 2021-10-24 MED ORDER — FENTANYL CITRATE (PF) 100 MCG/2ML IJ SOLN
INTRAMUSCULAR | Status: DC | PRN
  Administered 2021-10-24: 12.5 ug via INTRAVENOUS

## 2021-10-24 MED ORDER — ALBUTEROL SULFATE HFA 108 (90 BASE) MCG/ACT IN AERS
2.0000 | INHALATION_SPRAY | Freq: Four times a day (QID) | RESPIRATORY_TRACT | Status: DC | PRN
  Filled 2021-10-24: qty 6.7

## 2021-10-24 MED ORDER — SODIUM CHLORIDE 0.9 % IV SOLN: 250.0000 mL | INTRAVENOUS | Status: DC | PRN

## 2021-10-24 MED ORDER — HEPARIN (PORCINE) IN NACL 1000-0.9 UT/500ML-% IV SOLN
INTRAVENOUS | Status: DC | PRN
  Administered 2021-10-24 (×2): 500 mL

## 2021-10-24 MED ORDER — ONDANSETRON HCL 4 MG/2ML IJ SOLN
4.0000 mg | Freq: Four times a day (QID) | INTRAMUSCULAR | Status: DC | PRN
Start: 2021-10-24 — End: 2021-10-27

## 2021-10-24 SURGICAL SUPPLY — 15 items
CATH 5F 110X4 TIG (CATHETERS) ×1 IMPLANT
CATH INFINITI 5FR ANG PIGTAIL (CATHETERS) ×1 IMPLANT
DEVICE RAD TR BAND REGULAR (VASCULAR PRODUCTS) ×1 IMPLANT
DRAPE BRACHIAL (DRAPES) ×1 IMPLANT
GLIDESHEATH SLEND SS 6F .021 (SHEATH) ×1 IMPLANT
GUIDEWIRE INQWIRE 1.5J.035X260 (WIRE) IMPLANT
INQWIRE 1.5J .035X260CM (WIRE) ×2
KIT ENCORE 26 ADVANTAGE (KITS) IMPLANT
PACK CARDIAC CATH (CUSTOM PROCEDURE TRAY) ×2 IMPLANT
PAD ONESTEP ZOLL R SERIES ADT (MISCELLANEOUS) ×1 IMPLANT
PROTECTION STATION PRESSURIZED (MISCELLANEOUS) ×2
SET ATX SIMPLICITY (MISCELLANEOUS) ×1 IMPLANT
STATION PROTECTION PRESSURIZED (MISCELLANEOUS) IMPLANT
TUBING CIL FLEX 10 FLL-RA (TUBING) ×1 IMPLANT
WIRE HITORQ VERSACORE ST 145CM (WIRE) ×1 IMPLANT

## 2021-10-24 NOTE — Progress Notes (Signed)
ANTICOAGULATION CONSULT NOTE - Initial Consult  Pharmacy Consult for Heparin Drip Indication: chest pain/ACS  Allergies  Allergen Reactions   Aspirin Hives   Other Hives    Yellow dye 6 FOOD COLOR YELLOW POWDER    Atorvastatin Other (See Comments)    unknown   Codeine Other (See Comments)    Doesn't know   Conj Estrog-Medroxyprogest Ace Other (See Comments)    PREMPRO 0.3-1.5 MG ORAL TABLET unknown   Prednisone Other (See Comments)    Chest pain   Raloxifene Other (See Comments)    EVISTA 60 MG ORAL TABLET unknown   Ace Inhibitors Cough   Amlodipine Itching   Valsartan Itching    Patient Measurements: Height: 5' (152.4 cm) Weight: 69.9 kg (154 lb 3.2 oz) IBW/kg (Calculated) : 45.5 Heparin Dosing Weight: 60.8 kg  Vital Signs: Temp: 98.1 F (36.7 C) (01/01 1226) Temp Source: Oral (01/01 1226) BP: 178/85 (01/01 1800) Pulse Rate: 60 (01/01 1800)  Labs: Recent Labs    10/24/21 1226  HGB 14.0  HCT 42.6  PLT 288  CREATININE 0.94  TROPONINIHS 87*    Estimated Creatinine Clearance: 38.9 mL/min (by C-G formula based on SCr of 0.94 mg/dL).   Medical History: Past Medical History:  Diagnosis Date   Arthritis    ra   Asthma    CHF (congestive heart failure) (HCC)    Dysrhythmia    Edema extremities    GERD (gastroesophageal reflux disease)    Gout    Headache    History of hiatal hernia    Hypertension    Hypothyroidism    Shortness of breath dyspnea    Sleep apnea     Assessment: Patient is a 87yo female admitted with chest pain. Pharmacy consulted for Heparin dosing. No prior to admission anticoagulants noted.  Goal of Therapy:  Heparin level 0.3-0.7 units/ml Monitor platelets by anticoagulation protocol: Yes   Plan:  Give 3700 units bolus x 1 Start heparin infusion at 750 units/hr Check anti-Xa level in 8 hours and daily while on heparin Continue to monitor H&H and platelets  Clovia Cuff, PharmD, BCPS 10/24/2021 6:23 PM

## 2021-10-24 NOTE — ED Triage Notes (Signed)
Pt via EMS from home. Pt c/o chest pain mid, that radiates to her R arm. Pt does have cardiac hx. Pt also endorses nausea and mild SOB. Pt is A&Ox4 and NAD. Denies blood thinner use.

## 2021-10-24 NOTE — ED Provider Notes (Signed)
[facility] Provider Note    Event Date/Time   First MD Initiated Contact with Patient 10/24/21 1743     (approximate)   History   Chest Pain   HPI  Andrea Phillips is a 87 y.o. female who comes in complaining of chest tightness.  This tightness is mild radiates to the right arm and the back.  Patient says the pain started this morning.  She did not take her blood pressure medicines this morning.  She did get a spray of nitro from EMS but that did not do anything for the pain.  She says she gets hives with aspirin.  EMS was told that the pain was 8 out of 10.  Patient says mild but did not give me a number.  Patient reports she was told she had congestive heart failure and prior MI that was symptomless.  Reportedly she also had a cath a few months ago which was completely normal.      Physical Exam   Triage Vital Signs: ED Triage Vitals  Enc Vitals Group     BP 10/24/21 1226 (!) 173/75     Pulse Rate 10/24/21 1226 (!) 53     Resp 10/24/21 1226 20     Temp 10/24/21 1226 98.1 F (36.7 C)     Temp Source 10/24/21 1226 Oral     SpO2 10/24/21 1226 94 %     Weight 10/24/21 1222 154 lb 3.2 oz (69.9 kg)     Height 10/24/21 1222 5' (1.524 m)     Head Circumference --      Peak Flow --      Pain Score 10/24/21 1222 6     Pain Loc --      Pain Edu? --      Excl. in GC? --     Most recent vital signs: Vitals:   10/24/21 2315 10/24/21 2330  BP: (!) 168/73 (!) 168/69  Pulse: (!) 57 (!) 58  Resp: 16 17  Temp:    SpO2: 98% 99%     General: Awake, no distress.  CV:  Good peripheral perfusion.  Heart regular rate and rhythm no audible murmurs Resp:  Normal effort.  Lungs are clear Abd:  No distention.  Abdomen is soft and nontender with no organomegaly Extremities show no edema   ED Results / Procedures / Treatments   Labs (all labs ordered are listed, but only abnormal results are displayed) Labs Reviewed  BASIC METABOLIC PANEL - Abnormal;  Notable for the following components:      Result Value   Glucose, Bld 117 (*)    GFR, Estimated 60 (*)    Anion gap 4 (*)    All other components within normal limits  APTT - Abnormal; Notable for the following components:   aPTT >200 (*)    All other components within normal limits  BRAIN NATRIURETIC PEPTIDE - Abnormal; Notable for the following components:   B Natriuretic Peptide 456.9 (*)    All other components within normal limits  D-DIMER, QUANTITATIVE - Abnormal; Notable for the following components:   D-Dimer, Quant 0.92 (*)    All other components within normal limits  D-DIMER, QUANTITATIVE (NOT AT [facility]) - Abnormal; Notable for the following components:   D-Dimer, Quant 0.88 (*)    All other components within normal limits  TROPONIN I (HIGH SENSITIVITY) - Abnormal; Notable for the following components:   Troponin I (High Sensitivity) 87 (*)    All  other components within normal limits  TROPONIN I (HIGH SENSITIVITY) - Abnormal; Notable for the following components:   Troponin I (High Sensitivity) >24,000 (*)    All other components within normal limits  RESP PANEL BY RT-PCR (FLU A&B, COVID) ARPGX2  CBC  MAGNESIUM  PROCALCITONIN  PROTIME-INR  LIPID PANEL  VITAMIN B12  HEMOGLOBIN A1C     EKG  EKG read interpreted by me shows sinus bradycardia at rate of 53 normal axis some slight ST segment downsloping in 1 and L.  Otherwise no acute changes there is 1 PVC.  This is slightly different from EKG from November this year. EKG #2 read interpreted by me shows normal sinus rhythm rate of 67 normal axis no marked change from the EKG #1.  RADIOLOGY Chest x-ray read by radiology reviewed by me shows markedly improved but still persistent bilateral lower lobe bronchopneumonia.  Radiology says it is slightly improved.  I did look at the current films and her previous films.   PROCEDURES:  Critical Care performed: Critical care time 25 minutes.  This involves going back to  check on the patient several times.  Additionally I discussed patient with Dr. Okey Dupre who is on-call for STEMI.  He reviewed the EKGs and did not think the patient needed a cath.  I then discussed the patient with the hospitalist who then called the patient's regular cardiologist group that Dr. Len Blalock in and did catheterize the patient found a complete occlusion that was too distal and small to revascularize.  This was new.  Additionally I gave the patient Plavix and she was allergic to aspirin and heparin bolus and Nitropaste.  Patient's blood pressure was up as she did not take her blood pressure medicine today.  Procedures   MEDICATIONS ORDERED IN ED: Medications  levothyroxine (SYNTHROID) tablet 112 mcg ( Oral MAR Unhold 10/24/21 2152)  irbesartan (AVAPRO) tablet 300 mg ( Oral MAR Unhold 10/24/21 2152)  albuterol (VENTOLIN HFA) 108 (90 Base) MCG/ACT inhaler 2 puff ( Inhalation MAR Unhold 10/24/21 2152)  acetaminophen (TYLENOL) tablet 650 mg ( Oral MAR Unhold 10/24/21 2152)  ondansetron (ZOFRAN) injection 4 mg ( Intravenous MAR Unhold 10/24/21 2152)  nitroGLYCERIN (NITROSTAT) SL tablet 0.4 mg ( Sublingual MAR Unhold 10/24/21 2152)  meclizine (ANTIVERT) tablet 25 mg ( Oral MAR Unhold 10/24/21 2152)  mometasone-formoterol (DULERA) 200-5 MCG/ACT inhaler 2 puff (2 puffs Inhalation Not Given 10/24/21 2250)  labetalol (NORMODYNE) injection 10 mg (has no administration in time range)  hydrALAZINE (APRESOLINE) injection 10 mg (has no administration in time range)  sodium chloride flush (NS) 0.9 % injection 3 mL (3 mLs Intravenous Given 10/24/21 2249)  sodium chloride flush (NS) 0.9 % injection 3 mL (has no administration in time range)  0.9 %  sodium chloride infusion (has no administration in time range)  clopidogrel (PLAVIX) tablet 75 mg (has no administration in time range)  metoprolol succinate (TOPROL-XL) 24 hr tablet 25 mg (has no administration in time range)  nitroGLYCERIN (NITROGLYN) 2 % ointment 0.5 inch (0.5  inches Topical Given 10/24/21 1827)  clopidogrel (PLAVIX) tablet 300 mg (300 mg Oral Given 10/24/21 1827)  heparin bolus via infusion 3,700 Units (3,700 Units Intravenous Bolus from Bag 10/24/21 1848)  fentaNYL (SUBLIMAZE) injection 25 mcg (25 mcg Intravenous Given 10/24/21 2252)     IMPRESSION / MDM / ASSESSMENT AND PLAN / ED COURSE  I reviewed the triage vital signs and the nursing notes.                            -----------------------------------------  6:24 PM on 10/24/2021 ----------------------------------------- Patient's troponin is called and is greater than the machine can read.  Patient is then having an NSTEMI.  We will get another EKG to see if there is any acute changes from the previous one.  I have discussed this with Dr. Okey Dupre who is on-call for cath.  He elects for medical management unless there are acute EKG changes.  Differential diagnosis includes, but is not limited to STEMI or NSTEMI.  Patient's pain was radiating to the back so aneurysm could have been a consideration.  Chest x-ray looked okay and that pain was not severe so I elected not to pursue this especially as the troponin went up drastically.  Repeat EKG did not look like STEMI.  Additionally this could have been gastritis or reflux.  Possibly even a gastric ulcer could have produced pain like this.  Additionally chest wall pain or nonspecific chest pain could do this.  The patient is on the cardiac monitor to evaluate for evidence of arrhythmia and/or significant heart rate changes nothing significant was seen however.  Chest x-ray was reviewed by me and compared to old films.  Labs were also reviewed by me and compared to old values.  The marked elevation of troponin was especially significant and made me discussed the patient with cardiology and then of course the hospitalist.  Old records were reviewed I did not however see the catheterization report.  I did see the catheterization report from today after the patient  had been admitted.  Patient got heparin Plavix and Nitropaste as I noted above.  She was given Plavix as she had the hives with aspirin in the past.  Patient has a history of hypertension.  I do not recall at this time if she had any diabetes or other risk factors first coronary artery disease course she has a history of suspected old MI and CHF.  Patient's family member was in the room with her and I discussed most of these findings and the plan to admit the patient with the patient's family.      FINAL CLINICAL IMPRESSION(S) / ED DIAGNOSES   Final diagnoses:  NSTEMI (non-ST elevated myocardial infarction) [facility])     Rx / DC Orders   ED Discharge Orders          Ordered    AMB Referral to Cardiac Rehabilitation - Phase II        10/24/21 2140             Note:  This document was prepared using Dragon voice recognition software and may include unintentional dictation errors.   Arnaldo Natal, MD 10/25/21 [phone]

## 2021-10-24 NOTE — ED Triage Notes (Signed)
Pt in via EMS from home with c/o of CP. EMS reports 8/10 non radiating. Pt a-fib on monitor with no hx of the same. Pt reports pain started this am. 210/115, Pt did not take BP meds this am. Pt was given 1 spray of nitro with a little relief but the pain is back now. Pt allergic to asa. #18 to left AB. Pt with hx of CHF as well.

## 2021-10-24 NOTE — Consult Note (Signed)
Cardiology Consultation:   Patient ID: [name] MRN: [id number]; DOB: 03-09-1935  Admit date: 10/24/2021 Date of Consult: 10/24/2021  PCP:  Andrea Loveless, MD   [facility] Providers Cardiologist:  Andrea Hooker, MD     Patient Profile:   Andrea Phillips is a 87 y.o. female with a hx of chronic HFrEF, frequent PVCs, hypertension, hyperlipidemia, obstructive sleep apnea, and recent left lower lobe pneumonia, who is being seen 10/24/2021 for the evaluation of chest pain and elevated troponin at the request of Dr. Darnelle Phillips.  History of Present Illness:   Andrea Phillips was in her usual state of health until she woke up around 11 this morning.  She describes waxing and waning pressure in the center of her chest radiating to her upper back and right arm accompanied by shortness of breath and nausea.  This prompted her to come to the emergency department where she received sublingual nitroglycerin as well as nitroglycerin paste with only mild improvement in her chest pressure.  She continues to rated 6/10 in intensity.  It is not worsened with deep inspiration or movement.  She was noted to be quite hypertensive in the emergency department.  Labs were notable for initial high-sensitivity troponin I of 85, with repeat increasing to greater than 24,000.  Due to ongoing chest pain and marked troponin rise, I was contacted by Drs. Andrea Phillips and Andrea Phillips to proceed with urgent cardiac catheterization and possible PCI in the setting of high risk NSTEMI.  The patient received clopidogrel 300 mg x 1, given her aspirin allergy, as well as IV heparin in the ED.   Past Medical History:  Diagnosis Date   Arthritis    ra   Asthma    CHF (congestive heart failure) (HCC)    Dysrhythmia    Edema extremities    GERD (gastroesophageal reflux disease)    Gout    Headache    History of hiatal hernia    Hypertension    Hypothyroidism    Shortness of breath dyspnea    Sleep apnea     Past Surgical  History:  Procedure Laterality Date   APPENDECTOMY     BACK SURGERY     CARDIAC CATHETERIZATION     CATARACT EXTRACTION W/PHACO Left 03/09/2015   Procedure: CATARACT EXTRACTION PHACO AND INTRAOCULAR LENS PLACEMENT (IOC);  Surgeon: Sallee Lange, MD;  Location: [facility] ORS;  Service: Ophthalmology;  Laterality: Left;  [place] 01:31 AP% 26.1 CDE 43.72   CATARACT EXTRACTION W/PHACO Right 08/19/2021   Procedure: CATARACT EXTRACTION PHACO AND INTRAOCULAR LENS PLACEMENT (IOC) RIGHT;  Surgeon: Galen Manila, MD;  Location: [facility] ORS;  Service: Ophthalmology;  Laterality: Right;  12.31 1:07.0   CATARACT EXTRACTION W/PHACO Right 09/07/2021   Procedure: REMOVAL OF LENS FRAGMENTS RIGHT;  Surgeon: Galen Manila, MD;  Location: [facility];  Service: Ophthalmology;  Laterality: Right;   CHOLECYSTECTOMY     EYE SURGERY     retina   JOINT REPLACEMENT     left knee   LEFT HEART CATH AND CORONARY ANGIOGRAPHY N/A 04/02/2021   Procedure: LEFT HEART CATH AND CORONARY ANGIOGRAPHY;  Surgeon: Lamar Blinks, MD;  Location: [facility] INVASIVE CV LAB;  Service: Cardiovascular;  Laterality: N/A;      Inpatient Medications: Scheduled Meds:  irbesartan  300 mg Oral Daily   [START ON 10/25/2021] levothyroxine  112 mcg Oral Q0600   [START ON 10/25/2021] metoprolol succinate  25 mg Oral Daily   mometasone-formoterol  2 puff Inhalation BID  Continuous Infusions:  heparin 750 Units/hr (10/24/21 1849)   PRN Meds: acetaminophen, albuterol, meclizine, nitroGLYCERIN, ondansetron (ZOFRAN) IV  Allergies:    Allergies  Allergen Reactions   Aspirin Hives   Other Hives    Yellow dye 6 FOOD COLOR YELLOW POWDER    Atorvastatin Other (See Comments)    unknown   Codeine Other (See Comments)    Doesn't know   Conj Estrog-Medroxyprogest Ace Other (See Comments)    PREMPRO 0.3-1.5 MG ORAL TABLET unknown   Prednisone Other (See Comments)    Chest pain   Raloxifene Other (See Comments)    EVISTA 60 MG ORAL  TABLET unknown   Ace Inhibitors Cough   Amlodipine Itching   Valsartan Itching    Social History:   Social History   Tobacco Use   Smoking status: Never   Smokeless tobacco: Never  Substance Use Topics   Alcohol use: No   Drug use: No      Family History:   Family History  Problem Relation Age of Onset   Heart disease Mother    Heart disease Father    Breast cancer Cousin      ROS:  Please see the history of present illness. All other ROS reviewed and negative.     Physical Exam/Data:   Vitals:   10/24/21 1226 10/24/21 1720 10/24/21 1730 10/24/21 1800  BP: (!) 173/75 (!) 180/80 (!) 197/75 (!) 178/85  Pulse: (!) 53 72 73 60  Resp: 20 14 12 14   Temp: 98.1 F (36.7 C)     TempSrc: Oral     SpO2: 94% 99% 98% 99%  Weight:      Height:       No intake or output data in the 24 hours ending 10/24/21 2059 Last 3 Weights 10/24/2021 09/07/2021 08/31/2021  Weight (lbs) 154 lb 3.2 oz 157 lb 160 lb 4.4 oz  Weight (kg) 69.945 kg 71.215 kg 72.7 kg     Body mass index is 30.12 kg/m.  General:  Well nourished, well developed, in no acute distress.  She is accompanied by her sister. HEENT: normal Neck: no JVD Vascular: No carotid bruits; Distal pulses 2+ bilaterally Cardiac: Regular rate and rhythm with occasional extrasystoles.  No murmurs, rubs, or gallops. Lungs:  clear to auscultation bilaterally, no wheezing, rhonchi or rales  Abd: soft, nontender, no hepatomegaly  Ext: no edema Musculoskeletal:  No deformities, BUE and BLE strength normal and equal Skin: warm and dry  Neuro:  CNs 2-12 intact, no focal abnormalities noted Psych:  Normal affect   EKG:  The EKG was personally reviewed and demonstrates:  Possible ectopic atrial rhythm versus sinus arrhythmia with inferolateral ST/T changes. Telemetry:  Telemetry was personally reviewed and demonstrates: Sinus rhythm with PVCs.  Relevant CV Studies: TTE (04/02/2021):  1. Left ventricular ejection fraction, by  estimation, is 25 to 30%. The  left ventricle has severely decreased function. The left ventricle  demonstrates global hypokinesis. The left ventricular internal cavity size  was moderately dilated. Left  ventricular diastolic parameters were normal.   2. Right ventricular systolic function is normal. The right ventricular  size is normal.   3. The mitral valve is normal in structure. Mild to moderate mitral valve  regurgitation.   4. Tricuspid valve regurgitation is mild to moderate.   5. The aortic valve is normal in structure. Aortic valve regurgitation is  not visualized.   LHC (04/02/2021): No significant coronary artery disease.  Laboratory Data:  High Sensitivity Troponin:  Recent Labs  Lab 10/24/21 1226 10/24/21 1727  TROPONINIHS 87* >24,000*     Chemistry Recent Labs  Lab 10/24/21 1226 10/24/21 1929  NA 136  --   K 3.9  --   CL 104  --   CO2 28  --   GLUCOSE 117*  --   BUN 20  --   CREATININE 0.94  --   CALCIUM 9.9  --   MG  --  2.2  GFRNONAA 60*  --   ANIONGAP 4*  --     No results for input(s): PROT, ALBUMIN, AST, ALT, ALKPHOS, BILITOT in the last 168 hours. Lipids No results for input(s): CHOL, TRIG, HDL, LABVLDL, LDLCALC, CHOLHDL in the last 168 hours.  Hematology Recent Labs  Lab 10/24/21 1226  WBC 6.3  RBC 4.69  HGB 14.0  HCT 42.6  MCV 90.8  MCH 29.9  MCHC 32.9  RDW 14.4  PLT 288   Thyroid No results for input(s): TSH, FREET4 in the last 168 hours.  BNP Recent Labs  Lab 10/24/21 1929  BNP 456.9*    DDimer  Recent Labs  Lab 10/24/21 1226 10/24/21 1929  DDIMER 0.92* 0.88*     Radiology/Studies:  DG Chest 2 View  Result Date: 10/24/2021 CLINICAL DATA:  87 year old female with history of shortness of breath and chest pain. EXAM: CHEST - 2 VIEW COMPARISON:  Chest x-ray 08/29/2021. FINDINGS: Lung volumes are normal. Persistent areas of peribronchial cuffing, interstitial prominence an ill-defined airspace disease throughout the mid  to lower lungs bilaterally. No pleural effusions. No pneumothorax. No pulmonary nodule or mass noted. Pulmonary vasculature and the cardiomediastinal silhouette are within normal limits. Atherosclerosis in the thoracic aorta. IMPRESSION: 1. Persistent bilateral lower lobe bronchopneumonia, with slightly improved aeration in the right lower lobe compared to the prior examination. Electronically Signed   By: Trudie Reed M.D.   On: 10/24/2021 13:21     Assessment and Plan:   NSTEMI: Patient presents with acute onset of chest pressure that has been waxing and waning since 11 AM.  EKG shows subtle inferolateral ST/T changes but troponin markedly elevated.  Cardiac catheterization just over 6 months ago reportedly showed no significant CAD, though on review of the angiograms, there is some plaque in the LAD and RCA with quite tortuous coronary arteries.  Given continued chest pain despite aggressive medical therapy, I agree with moving forward with urgent cardiac catheterization and possible PCI.  The patient has already been loaded with clopidogrel in the setting of her aspirin allergy.  We will need to reassess long-term antiplatelet therapy following catheterization, including risks versus benefits of aspirin desensitization.  Chronic HFrEF: LVEF previously 25-30% in 03/2021.  Patient appears euvolemic on exam.  We will assess LVEDP at time of catheterization and make further recommendations regarding diuresis.  Goal-directed medical therapy will need to be optimized as tolerated after the procedure.  Following catheterization, ongoing cardiology care will be taken over by [facility] cardiology, given that the patient follows with Dr. Gwen Pounds.  For questions or updates, please contact [facility] Please consult [email or web address] for contact info under [facility].  Signed, Yvonne Kendall, MD  10/24/2021 8:59 PM

## 2021-10-24 NOTE — H&P (Addendum)
History and Physical   Andrea Phillips [id number] DOB: Oct 09, 1935 DOA: 10/24/2021  PCP: Perrin Maltese, MD  Outpatient Specialists: Dr. Nehemiah Massed, [facility] Cardiology Patient coming from: home via EMS  I have personally briefly reviewed patient's old medical records in [place].  Chief Concern: Chest pain  HPI: Andrea Phillips is a 87 y.o. female with medical history significant for heart failure with reduced ejection fraction NYHA class III, frequent PVCs, GERD, hypothyroid, hypertension, obstructive sleep apnea on CPAP, history of recent left lower lobe pneumonia, who presents to the emergency department for chief concerns of chest pain that goes to her back and radiates to her right arm.  She woke up at 11 am and had chest pain. She describes the chest pain as pressured, 9/10, persistent, and does not go away. She reports currently the pain is a 7/10. She denies any new abnormal stressors in her life.  She reports she is never felt this way before.  She denies any other symptoms including radiation to her extremities.  She endorses shortness of breath. She endorses some nausea and denies vomiting. She denies fever, chills, new cough, abdominal pain, diarrhea, dysuria, hematuria.   She states her last bowel movement was 10/23/21 and it was normal.   She did have a small amount of wine the previous night for New Years. She states it was about 2-3 oz.   Social history: She lives with her granddaughter and granddaughter's family at home. She denies history tobacco, etoh, recreational use. 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   Vaccination history: She is not vaccinated for covid 19. She did get her influenza shot this year.   ROS: Constitutional: no weight change, no fever ENT/Mouth: no sore throat, no rhinorrhea Eyes: no eye pain, no vision changes Cardiovascular: + chest pain, + dyspnea,  no edema, no  palpitations Respiratory: no cough, no sputum, no wheezing Gastrointestinal: no nausea, no vomiting, no diarrhea, no constipation Genitourinary: no urinary incontinence, no dysuria, no hematuria Musculoskeletal: no arthralgias, no myalgias Skin: no skin lesions, no pruritus, Neuro: + weakness, no loss of consciousness, no syncope Psych: no anxiety, no depression, + decrease appetite Heme/Lymph: no bruising, no bleeding  ED Course: Discussed with emergency medicine provider, patient requiring hospitalization for chief concerns of NSTEMI.  Vitals in the emergency department showed temperature of 98.1, respiration rate of 20, heart rate of 53, initial blood pressure via EMS with 210/115, patient did not take her blood pressure medication this morning.  In the emergency department her blood pressure was 173/75, SPO2 of 94% on room air.  Labs in the emergency department showed serum sodium 136, potassium 3.9, chloride 104, bicarb 28, BUN of 20, serum creatinine of 0.94, nonfasting blood glucose 117, GFR60.  WBC was 6.3, hemoglobin 14, platelets of 288.  High-sensitivity troponin was 87.   In the emergency department patient was started on heparin Bolus and GTT, Plavix 300 mg p.o. once.  Second repeat high sensitive troponin was called back to the ED department stating that it was too high to be recorded in their machine.  EDP called STEMI cardiologist on-call and EKG did not show any ST or T wave changes indicating STEMI.  Patient was also given 1 dose of Nitropaste to her chest pain.  Assessment/Plan  Principal Problem:   NSTEMI (non-ST elevated myocardial infarction) ([place]) Active Problems:   Essential hypertension   Frequent PVCs   OSA (obstructive sleep apnea)  Elevated troponin   Acute on chronic systolic CHF (congestive heart failure) (HCC)   Hypothyroidism   Hyperlipidemia, mixed   H/O medication noncompliance   # Chest pain-etiology work-up in progress at this time - Query  lab error on second high-sensitivity troponin value - Repeat high sensitive troponins in process at this time - NSTEMI presumed secondary to hypertensive urgency in setting of patient not taking her antihypertensive medications - Complete echo ordered - We will follow-up on second troponin - Nitroglycerin sublingual every 5 hours as needed for chest pain - Check stat BNP, D-dimer, B12, magnesium, lipid panel in the a.m. - Check procalcitonin - Admit to stepdown, inpatient - N.p.o. except for sips with meds - Cardiology, Dr. Corky Sox was consulted by myself who states the patient will go for need cardiac catheterization  - Resumed home metoprolol 25 mg daily - Continue heparin per pharmacy - Patient is prescribed spironolactone 25 mg daily, does not appear patient has started this medication - Patient will go for cath with Dr. Saunders Revel  # Hypertension-uncontrolled - Resumed home ARB equivalent with irbesartan 200 mg daily  # OSA-CPAP nightly resumed  # Hypothyroid-levothyroxine 112 mcg daily in the morning resume  Chart reviewed.   10/04/2021: Patient was seen by Dr. Nehemiah Massed, [facility] clinic cardiology who recommended the patient continue telmisartan 80 mg daily for hypertension management, metoprolol 25 mg daily, and restarted spironolactone 25 mg daily. -There was no mention of Coreg as a home medication  DVT prophylaxis: Heparin GTT Code Status: Full code Diet: N.p.o. except for sips with meds and ice chips Family Communication: Updated sister at bedside Disposition Plan: Pending clinical course Consults called: Cardiology, Dr. Corky Sox  Admission status: Admit to stepdown, inpatient  Past Medical History:  Diagnosis Date   Arthritis    ra   Asthma    CHF (congestive heart failure) ([place])    Dysrhythmia    Edema extremities    GERD (gastroesophageal reflux disease)    Gout    Headache    History of hiatal hernia    Hypertension    Hypothyroidism    Shortness of breath dyspnea     Sleep apnea    Past Surgical History:  Procedure Laterality Date   APPENDECTOMY     BACK SURGERY     CARDIAC CATHETERIZATION     CATARACT EXTRACTION W/PHACO Left 03/09/2015   Procedure: CATARACT EXTRACTION PHACO AND INTRAOCULAR LENS PLACEMENT ([place]);  Surgeon: Estill Cotta, MD;  Location: [facility] ORS;  Service: Ophthalmology;  Laterality: Left;  [place] 01:31 AP% 26.1 CDE 43.72   CATARACT EXTRACTION W/PHACO Right 08/19/2021   Procedure: CATARACT EXTRACTION PHACO AND INTRAOCULAR LENS PLACEMENT ([place]) RIGHT;  Surgeon: Birder Robson, MD;  Location: [facility] ORS;  Service: Ophthalmology;  Laterality: Right;  12.31 1:07.0   CATARACT EXTRACTION W/PHACO Right 09/07/2021   Procedure: REMOVAL OF LENS FRAGMENTS RIGHT;  Surgeon: Birder Robson, MD;  Location: [place];  Service: Ophthalmology;  Laterality: Right;   CHOLECYSTECTOMY     EYE SURGERY     retina   JOINT REPLACEMENT     left knee   LEFT HEART CATH AND CORONARY ANGIOGRAPHY N/A 04/02/2021   Procedure: LEFT HEART CATH AND CORONARY ANGIOGRAPHY;  Surgeon: Corey Skains, MD;  Location: [place] [facility];  Service: Cardiovascular;  Laterality: N/A;   Social History:  reports that she has never smoked. She has never used smokeless tobacco. She reports that she does not drink alcohol and does not use drugs.  Allergies  Allergen Reactions   Aspirin Hives   Other Hives    Yellow dye 6 FOOD COLOR YELLOW POWDER    Atorvastatin Other (See Comments)    unknown   Codeine Other (See Comments)    Doesn't know   Conj Estrog-Medroxyprogest Ace Other (See Comments)    PREMPRO 0.3-1.5 MG ORAL TABLET unknown   Prednisone Other (See Comments)    Chest pain   Raloxifene Other (See Comments)    EVISTA 60 MG ORAL TABLET unknown   Ace Inhibitors Cough   Amlodipine Itching   Valsartan Itching   Family History  Problem Relation Age of Onset   Breast cancer Cousin    Family history: Family history reviewed and not  pertinent.  Prior to Admission medications   Medication Sig Start Date End Date Taking? Authorizing Provider  albuterol (VENTOLIN HFA) 108 (90 Base) MCG/ACT inhaler Inhale 2 puffs into the lungs every 6 (six) hours as needed for wheezing or shortness of breath.    [provider]  brimonidine-timolol (COMBIGAN) 0.2-0.5 % ophthalmic solution Place 1 drop into the right eye 2 (two) times daily. 09/01/21   Wouk, Ailene Rud, MD  cetirizine (ZYRTEC) 10 MG tablet Take 10 mg by mouth daily.    [provider]  fluocinonide (LIDEX) 0.05 % external solution Apply 1 application topically once a week.    [provider]  fluticasone (FLONASE) 50 MCG/ACT nasal spray Place 1 spray into both nostrils 2 (two) times daily as needed for allergies or rhinitis.    [provider]  furosemide (LASIX) 40 MG tablet Take 0.5 tablets (20 mg total) by mouth daily. 09/01/21 09/01/22  Wouk, Ailene Rud, MD  icosapent Ethyl (VASCEPA) 1 g capsule Take 2 g by mouth 2 (two) times daily with a meal.    [provider]  levothyroxine (SYNTHROID) 112 MCG tablet Take 112 mcg by mouth daily. 02/23/21   [provider]  meclizine (ANTIVERT) 25 MG tablet Take 25 mg by mouth 2 (two) times daily as needed for dizziness. 07/05/21   [provider]  metoprolol succinate (TOPROL-XL) 50 MG 24 hr tablet Take 1 tablet (50 mg total) by mouth daily. Take with or immediately following a meal. Patient taking differently: Take 25 mg by mouth daily. Take with or immediately following a meal. 04/05/21   Gonfa, Charlesetta Ivory, MD  mometasone-formoterol (DULERA) 200-5 MCG/ACT AERO Inhale 2 puffs into the lungs 2 (two) times daily. Patient not taking: No sig reported 04/04/21   Mercy Riding, MD  Netarsudil Dimesylate (RHOPRESSA) 0.02 % SOLN Place 1 drop into the right eye at bedtime. 09/01/21   Wouk, Ailene Rud, MD  omeprazole (PRILOSEC OTC) 20 MG tablet Take 2 tablets (40 mg total) by mouth  daily. Patient taking differently: Take 40 mg by mouth daily as needed (reflux). 04/04/21   Mercy Riding, MD  Prednisolon-Moxiflox-Bromfenac 1-0.5-0.075 % SOLN Place 1 drop into the right eye 4 (four) times daily. 09/01/21   Wouk, Ailene Rud, MD  telmisartan (MICARDIS) 40 MG tablet Take 80 mg by mouth daily. 03/06/21   [provider]   Physical Exam: Vitals:   10/24/21 1226 10/24/21 1720 10/24/21 1730 10/24/21 1800  BP: (!) 173/75 (!) 180/80 (!) 197/75 (!) 178/85  Pulse: (!) 53 72 73 60  Resp: 20 14 12 14   Temp: 98.1 F (36.7 C)     TempSrc: Oral     SpO2: 94% 99% 98% 99%  Weight:      Height:  Constitutional: appears age-appropriate, frail, NAD, calm, comfortable Eyes: PERRL, lids and conjunctivae normal ENMT: Mucous membranes are moist. Posterior pharynx clear of any exudate or lesions. Age-appropriate dentition. Hearing appropriate Neck: normal, supple, no masses, no thyromegaly Respiratory: clear to auscultation bilaterally, no wheezing, no crackles. Normal respiratory effort. No accessory muscle use.  Cardiovascular: Regular rate and rhythm, no murmurs / rubs / gallops. No extremity edema. 2+ pedal pulses. No carotid bruits.  Abdomen: Obese abdomen, no tenderness, no masses palpated, no hepatosplenomegaly. Bowel sounds positive.  Musculoskeletal: no clubbing / cyanosis. No joint deformity upper and lower extremities. Good ROM, no contractures, no atrophy. Normal muscle tone.  Skin: no rashes, lesions, ulcers. No induration Neurologic: Sensation intact. Strength 5/5 in all 4.  Psychiatric: Normal judgment and insight. Alert and oriented x 3. Normal mood.   EKG: independently reviewed, showing sinus bradycardia with rate of 53, QTc 401  Chest x-ray on Admission: I personally reviewed and I agree with radiologist reading as below.  DG Chest 2 View  Result Date: 10/24/2021 CLINICAL DATA:  87 year old female with history of shortness of breath and chest pain. EXAM:  CHEST - 2 VIEW COMPARISON:  Chest x-ray 08/29/2021. FINDINGS: Lung volumes are normal. Persistent areas of peribronchial cuffing, interstitial prominence an ill-defined airspace disease throughout the mid to lower lungs bilaterally. No pleural effusions. No pneumothorax. No pulmonary nodule or mass noted. Pulmonary vasculature and the cardiomediastinal silhouette are within normal limits. Atherosclerosis in the thoracic aorta. IMPRESSION: 1. Persistent bilateral lower lobe bronchopneumonia, with slightly improved aeration in the right lower lobe compared to the prior examination. Electronically Signed   By: Vinnie Langton M.D.   On: 10/24/2021 13:21    Labs on Admission: I have personally reviewed following labs  CBC: Recent Labs  Lab 10/24/21 1226  WBC 6.3  HGB 14.0  HCT 42.6  MCV 90.8  PLT 123XX123   Basic Metabolic Panel: Recent Labs  Lab 10/24/21 1226  NA 136  K 3.9  CL 104  CO2 28  GLUCOSE 117*  BUN 20  CREATININE 0.94  CALCIUM 9.9   GFR: Estimated Creatinine Clearance: 38.9 mL/min (by C-G formula based on SCr of 0.94 mg/dL).  Urine analysis:    Component Value Date/Time   COLORURINE YELLOW (A) 08/29/2021 1927   APPEARANCEUR CLEAR (A) 08/29/2021 1927   LABSPEC 1.011 08/29/2021 1927   PHURINE 5.0 08/29/2021 1927   GLUCOSEU NEGATIVE 08/29/2021 1927   HGBUR SMALL (A) 08/29/2021 1927   BILIRUBINUR NEGATIVE 08/29/2021 1927   KETONESUR 20 (A) 08/29/2021 1927   PROTEINUR NEGATIVE 08/29/2021 1927   NITRITE NEGATIVE 08/29/2021 1927   LEUKOCYTESUR NEGATIVE 08/29/2021 1927   CRITICAL CARE Performed by: Briant Cedar Mahmud Keithly  Total critical care time: 35 minutes  Critical care time was exclusive of separately billable procedures and treating other patients.  Critical care was necessary to treat or prevent imminent or life-threatening deterioration.  Critical care was time spent personally by me on the following activities: development of treatment plan with patient and/or surrogate as  well as nursing, discussions with consultants, evaluation of patient's response to treatment, examination of patient, obtaining history from patient or surrogate, ordering and performing treatments and interventions, ordering and review of laboratory studies, ordering and review of radiographic studies, pulse oximetry and re-evaluation of patient's condition.  Dr. Tobie Poet [facility]  If 7PM-7AM, please contact overnight-coverage provider If 7AM-7PM, please contact day coverage provider [email or web address]  10/24/2021, 7:17 PM

## 2021-10-25 ENCOUNTER — Inpatient Hospital Stay: Payer: Medicare Other

## 2021-10-25 ENCOUNTER — Inpatient Hospital Stay
Admit: 2021-10-25 | Discharge: 2021-10-25 | Disposition: A | Discharge status: Home / Self Care | Payer: Medicare Other | Attending: Internal Medicine | Admitting: Internal Medicine

## 2021-10-25 DIAGNOSIS — I214 Non-ST elevation (NSTEMI) myocardial infarction: Secondary | ICD-10-CM | POA: Diagnosis not present

## 2021-10-25 LAB — ECHOCARDIOGRAM COMPLETE
AR max vel: 1.44 cm2
AV Area VTI: 1.53 cm2
AV Area mean vel: 1.21 cm2
AV Mean grad: 6 mmHg
AV Peak grad: 10.1 mmHg
Ao pk vel: 1.59 m/s
Area-P 1/2: 2.94 cm2
Calc EF: 45.1 %
Height: 60 in
MV VTI: 1.44 cm2
S' Lateral: 3.67 cm
Single Plane A2C EF: 39.6 %
Single Plane A4C EF: 49.9 %
Weight: 2467.2 oz

## 2021-10-25 LAB — MRSA NEXT GEN BY PCR, NASAL: MRSA by PCR Next Gen: NOT DETECTED

## 2021-10-25 LAB — LIPID PANEL
Cholesterol: 185 mg/dL (ref 0–200)
HDL: 49 mg/dL (ref >40)
LDL Cholesterol: 116 mg/dL — ABNORMAL HIGH (ref 0–99)
Total CHOL/HDL Ratio: 3.8 RATIO
Triglycerides: 99 mg/dL (ref <150)
VLDL: 20 mg/dL (ref 0–40)

## 2021-10-25 LAB — HEMOGLOBIN A1C
Hgb A1c MFr Bld: 5.2 % (ref 4.8–5.6)
Mean Plasma Glucose: 102.54 mg/dL

## 2021-10-25 LAB — VITAMIN B12: Vitamin B-12: 389 pg/mL (ref 180–914)

## 2021-10-25 LAB — HEPARIN LEVEL (UNFRACTIONATED)
Heparin Unfractionated: 0.16 IU/mL — ABNORMAL LOW (ref 0.30–0.70)
Heparin Unfractionated: 0.34 IU/mL (ref 0.30–0.70)

## 2021-10-25 MED ORDER — IOHEXOL 350 MG/ML SOLN: 150.0000 mL | Freq: Once | INTRAVENOUS | Status: DC | PRN

## 2021-10-25 MED ORDER — HEPARIN (PORCINE) 25000 UT/250ML-% IV SOLN
950.0000 [IU]/h | INTRAVENOUS | Status: AC
  Administered 2021-10-25: 750 [IU]/h via INTRAVENOUS
  Administered 2021-10-26: 950 [IU]/h via INTRAVENOUS
  Filled 2021-10-25 (×2): qty 250

## 2021-10-25 MED ORDER — SODIUM CHLORIDE 0.9 % IV BOLUS
250.0000 mL | Freq: Once | INTRAVENOUS | Status: AC
  Administered 2021-10-25: 250 mL via INTRAVENOUS

## 2021-10-25 MED ORDER — HEPARIN BOLUS VIA INFUSION
1800.0000 [IU] | Freq: Once | INTRAVENOUS | Status: AC
  Administered 2021-10-25: 1800 [IU] via INTRAVENOUS
  Filled 2021-10-25: qty 1800

## 2021-10-25 MED ORDER — CHLORHEXIDINE GLUCONATE CLOTH 2 % EX PADS: 6.0000 | MEDICATED_PAD | Freq: Every day | CUTANEOUS | Status: DC

## 2021-10-25 MED ORDER — IOHEXOL 350 MG/ML SOLN
100.0000 mL | Freq: Once | INTRAVENOUS | Status: AC | PRN
  Administered 2021-10-25: 75 mL via INTRAVENOUS

## 2021-10-25 MED ORDER — PRAVASTATIN SODIUM 20 MG PO TABS
20.0000 mg | ORAL_TABLET | Freq: Every day | ORAL | Status: DC
  Administered 2021-10-25 – 2021-10-26 (×2): 20 mg via ORAL
  Filled 2021-10-25 (×2): qty 1

## 2021-10-25 MED ORDER — PERFLUTREN LIPID MICROSPHERE
1.0000 mL | INTRAVENOUS | Status: AC | PRN
  Administered 2021-10-25: 3 mL via INTRAVENOUS
  Filled 2021-10-25: qty 10

## 2021-10-25 MED ORDER — FENTANYL CITRATE PF 50 MCG/ML IJ SOSY: 12.5000 ug | PREFILLED_SYRINGE | Freq: Once | INTRAMUSCULAR | Status: DC

## 2021-10-25 NOTE — Progress Notes (Addendum)
ANTICOAGULATION CONSULT NOTE  Pharmacy Consult for Heparin Drip Indication: chest pain/ACS  Patient Measurements: Height: 5' (152.4 cm) Weight: 69.9 kg (154 lb 3.2 oz) IBW/kg (Calculated) : 45.5 Heparin Dosing Weight: 60.8 kg  Labs: Recent Labs    10/24/21 1226 10/24/21 1727 10/24/21 1929 10/25/21 1128  HGB 14.0  --   --   --   HCT 42.6  --   --   --   PLT 288  --   --   --   APTT  --   --  >200*  --   LABPROT 13.0  --   --   --   INR 1.0  --   --   --   HEPARINUNFRC  --   --   --  0.16*  CREATININE 0.94  --   --   --   TROPONINIHS 87* >24,000*  --   --      Estimated Creatinine Clearance: 38.9 mL/min (by C-G formula based on SCr of 0.94 mg/dL).   Medical History: Past Medical History:  Diagnosis Date   Arthritis    ra   Asthma    CHF (congestive heart failure) (HCC)    Dysrhythmia    Edema extremities    GERD (gastroesophageal reflux disease)    Gout    Headache    History of hiatal hernia    Hypertension    Hypothyroidism    Shortness of breath dyspnea    Sleep apnea     Assessment: Patient is a 87yo female admitted with chest pain. Pharmacy consulted for heparin dosing. No prior to admission anticoagulants noted.  Patient is status post cardiac catheterization on 10/24/21 with findings of severe single-vessel CAD with occlusion of distal OM3 branch non amenable to intervention.  Goal of Therapy:  Heparin level 0.3-0.7 units/ml Monitor platelets by anticoagulation protocol: Yes   Plan:  --Heparin level is subtherapeutic --Heparin 1800 unit IV bolus and increase heparin infusion rate to 950 units/hr --Re-check HL 8 hours after rate change --Daily CBC per protocol while on IV heparin --Plan to continue IV heparin x 48 hours per cardiology for medical management of ACS   Andrea Phillips  10/25/2021 12:11 PM

## 2021-10-25 NOTE — Consult Note (Signed)
[facility] Cardiology  CARDIOLOGY CONSULT NOTE  Patient ID: ANU STAGNER MRN: [id number] DOB/AGE: 1935/06/16 87 y.o.  Admit date: 10/24/2021 Referring Physician Dr. Sedalia Muta Primary Physician Margaretann Loveless, MD Primary Cardiologist Gwen Pounds Reason for Consultation NSTEMI  HPI:  Andrea Phillips is an 87 year old female with a history of HFrEF (25-30%), frequent PVCs, hypertension, hyperlipidemia, OSA who presented to the hospital on 10/24/2021 for evaluation of chest pain.  Her troponin was discovered to be mildly elevated and then increasing to greater than 24,000.  EKG showed some ST depressions laterally.  She underwent urgent left heart catheterization overnight for ongoing chest pain which revealed an occluded OM 3 that was too small for intervention.  At the time of my interview she is chest pain free, sitting up in bed. She states that she was in her USOH until 40 AM yesterday when she woke up with severe chest pressure. She called EMS shortly therafter. She otherwise denies recent LE edema, orthopnea, PND. No recent fever, chills, cough, congestion.    Review of systems complete and found to be negative unless listed above     Past Medical History:  Diagnosis Date   Arthritis    ra   Asthma    CHF (congestive heart failure) (HCC)    Dysrhythmia    Edema extremities    GERD (gastroesophageal reflux disease)    Gout    Headache    History of hiatal hernia    Hypertension    Hypothyroidism    Shortness of breath dyspnea    Sleep apnea     Past Surgical History:  Procedure Laterality Date   APPENDECTOMY     BACK SURGERY     CARDIAC CATHETERIZATION     CATARACT EXTRACTION W/PHACO Left 03/09/2015   Procedure: CATARACT EXTRACTION PHACO AND INTRAOCULAR LENS PLACEMENT (IOC);  Surgeon: Sallee Lange, MD;  Location: [facility] ORS;  Service: Ophthalmology;  Laterality: Left;  [place] 01:31 AP% 26.1 CDE 43.72   CATARACT EXTRACTION W/PHACO Right 08/19/2021   Procedure: CATARACT EXTRACTION  PHACO AND INTRAOCULAR LENS PLACEMENT (IOC) RIGHT;  Surgeon: Galen Manila, MD;  Location: [facility] ORS;  Service: Ophthalmology;  Laterality: Right;  12.31 1:07.0   CATARACT EXTRACTION W/PHACO Right 09/07/2021   Procedure: REMOVAL OF LENS FRAGMENTS RIGHT;  Surgeon: Galen Manila, MD;  Location: [facility];  Service: Ophthalmology;  Laterality: Right;   CHOLECYSTECTOMY     EYE SURGERY     retina   JOINT REPLACEMENT     left knee   LEFT HEART CATH AND CORONARY ANGIOGRAPHY N/A 04/02/2021   Procedure: LEFT HEART CATH AND CORONARY ANGIOGRAPHY;  Surgeon: Lamar Blinks, MD;  Location: [facility];  Service: Cardiovascular;  Laterality: N/A;    Medications Prior to Admission  Medication Sig Dispense Refill Last Dose   carvedilol (COREG) 12.5 MG tablet Take 12.5 mg by mouth in the morning and at bedtime.      cetirizine (ZYRTEC) 10 MG tablet Take 10 mg by mouth daily.   10/23/2021 at NIGHT   fluticasone (FLONASE) 50 MCG/ACT nasal spray Place 1 spray into both nostrils 2 (two) times daily as needed for allergies or rhinitis.   10/23/2021 at NIGHT   furosemide (LASIX) 40 MG tablet Take 0.5 tablets (20 mg total) by mouth daily. 45 tablet 3 10/23/2021 at NIGHT   levothyroxine (SYNTHROID) 112 MCG tablet Take 112 mcg by mouth daily.   10/23/2021 at NIGHT   metoprolol succinate (TOPROL-XL) 50 MG 24 hr tablet Take 1 tablet (  50 mg total) by mouth daily. Take with or immediately following a meal. (Patient taking differently: Take 25 mg by mouth daily. Take with or immediately following a meal.) 90 tablet 1 10/23/2021   mometasone-formoterol (DULERA) 200-5 MCG/ACT AERO Inhale 2 puffs into the lungs 2 (two) times daily. 1 each 1 Past Week   omeprazole (PRILOSEC OTC) 20 MG tablet Take 2 tablets (40 mg total) by mouth daily. (Patient taking differently: Take 40 mg by mouth daily as needed (reflux).) 5 tablet 0 Past Week at PRN   telmisartan (MICARDIS) 40 MG tablet Take 80 mg by mouth daily.    10/23/2021 at NIGHT   albuterol (VENTOLIN HFA) 108 (90 Base) MCG/ACT inhaler Inhale 2 puffs into the lungs every 6 (six) hours as needed for wheezing or shortness of breath.   10/22/2021   brimonidine-timolol (COMBIGAN) 0.2-0.5 % ophthalmic solution Place 1 drop into the right eye 2 (two) times daily. (Patient not taking: Reported on 10/24/2021) 3 mL 0 Not Taking   fluocinonide (LIDEX) 0.05 % external solution Apply 1 application topically once a week. (Patient not taking: Reported on 10/24/2021)   Not Taking   icosapent Ethyl (VASCEPA) 1 g capsule Take 2 g by mouth 2 (two) times daily with a meal.      meclizine (ANTIVERT) 25 MG tablet Take 25 mg by mouth 2 (two) times daily as needed for dizziness.   PRN   Netarsudil Dimesylate (RHOPRESSA) 0.02 % SOLN Place 1 drop into the right eye at bedtime. (Patient not taking: Reported on 10/24/2021) 3 mL 0 Not Taking   Prednisolon-Moxiflox-Bromfenac 1-0.5-0.075 % SOLN Place 1 drop into the right eye 4 (four) times daily. (Patient not taking: Reported on 10/24/2021) 3 mL 0 Not Taking   Social History   Socioeconomic History   Marital status: Widowed    Spouse name: Not on file   Number of children: Not on file   Years of education: Not on file   Highest education level: Not on file  Occupational History   Not on file  Tobacco Use   Smoking status: Never   Smokeless tobacco: Never  Substance and Sexual Activity   Alcohol use: No   Drug use: No   Sexual activity: Not on file  Other Topics Concern   Not on file  Social History Narrative   Not on file   Social Determinants of Health   Financial Resource Strain: Not on file  Food Insecurity: Not on file  Transportation Needs: Not on file  Physical Activity: Not on file  Stress: Not on file  Social Connections: Not on file  Intimate Partner Violence: Not on file    Family History  Problem Relation Age of Onset   Heart disease Mother    Heart disease Father    Breast cancer Cousin        Review of systems complete and found to be negative unless listed above      PHYSICAL EXAM  General: Well developed, well nourished, in no acute distress HEENT:  Normocephalic and atramatic Neck:  No JVD.  Lungs: Clear bilaterally to auscultation and percussion. Heart: HRRR . Normal S1 and S2 without gallops or murmurs.  Abdomen: Bowel sounds are positive, abdomen soft and non-tender  Msk:  Back normal, normal gait. Normal strength and tone for age. Extremities: No clubbing, cyanosis or edema.   Neuro: Alert and oriented X 3. Psych:  Good affect, responds appropriately  Labs:   Lab Results  Component Value Date  WBC 6.3 10/24/2021   HGB 14.0 10/24/2021   HCT 42.6 10/24/2021   MCV 90.8 10/24/2021   PLT 288 10/24/2021    Recent Labs  Lab 10/24/21 1226  NA 136  K 3.9  CL 104  CO2 28  BUN 20  CREATININE 0.94  CALCIUM 9.9  GLUCOSE 117*   Lab Results  Component Value Date   CKTOTAL 113 04/02/2021    Lab Results  Component Value Date   CHOL 185 10/25/2021   CHOL 170 04/02/2021   Lab Results  Component Value Date   HDL 49 10/25/2021   HDL 46 04/02/2021   Lab Results  Component Value Date   LDLCALC 116 (H) 10/25/2021   LDLCALC 112 (H) 04/02/2021   Lab Results  Component Value Date   TRIG 99 10/25/2021   TRIG 60 04/02/2021   Lab Results  Component Value Date   CHOLHDL 3.8 10/25/2021   CHOLHDL 3.7 04/02/2021   No results found for: LDLDIRECT    Radiology: DG Chest 2 View  Result Date: 10/24/2021 CLINICAL DATA:  87 year old female with history of shortness of breath and chest pain. EXAM: CHEST - 2 VIEW COMPARISON:  Chest x-ray 08/29/2021. FINDINGS: Lung volumes are normal. Persistent areas of peribronchial cuffing, interstitial prominence an ill-defined airspace disease throughout the mid to lower lungs bilaterally. No pleural effusions. No pneumothorax. No pulmonary nodule or mass noted. Pulmonary vasculature and the cardiomediastinal silhouette are  within normal limits. Atherosclerosis in the thoracic aorta. IMPRESSION: 1. Persistent bilateral lower lobe bronchopneumonia, with slightly improved aeration in the right lower lobe compared to the prior examination. Electronically Signed   By: Trudie Reed M.D.   On: 10/24/2021 13:21   CT CHEST W CONTRAST  Result Date: 10/11/2021 CLINICAL DATA:  Follow-up pneumonia, productive cough and congestion since November 2022 EXAM: CT CHEST WITH CONTRAST TECHNIQUE: Multidetector CT imaging of the chest was performed during intravenous contrast administration. CONTRAST:  30mL OMNIPAQUE IOHEXOL 300 MG/ML  SOLN IV COMPARISON:  04/07/2020 CT chest, chest radiographs 08/29/2021 FINDINGS: Cardiovascular: Atherosclerotic calcifications aorta, proximal great vessels, and coronary arteries. Aorta normal caliber without aneurysm. Vascular structures grossly patent on non targeted exam. Dilatation of cardiac chambers. No pericardial effusion. Mediastinum/Nodes: Small hiatal hernia. Esophagus stable, unremarkable. Base of cervical region normal appearance. No thoracic adenopathy. Lungs/Pleura: Subsegmental atelectasis in BILATERAL lower lobes. Patchy infiltrates RIGHT lower lobe, improved since prior chest radiograph. Remaining lungs clear. No pleural effusion or pneumothorax. Upper Abdomen: Visualized upper abdomen unremarkable Musculoskeletal: No acute osseous findings. IMPRESSION: Patchy infiltrates RIGHT lower lobe, improved since prior chest radiograph. Bibasilar subsegmental atelectasis. Small hiatal hernia. Scattered atherosclerotic calcifications including coronary arteries. Aortic Atherosclerosis (ICD10-I70.0). Electronically Signed   By: Ulyses Southward M.D.   On: 10/11/2021 16:30   CARDIAC CATHETERIZATION  Result Date: 10/24/2021 Conclusions: Severe single-vessel coronary artery disease with occlusion of distal OM3 branch, which is too small/distal for intervention.  There is also mild plaquing in the mid LAD with up  to 20% stenosis. Moderately elevated left ventricular filling pressure (LVEDP 25 mmHg). Recommendation: Overnight observation in stepdown unit.  If patient has recurrent chest pain, recommend initiation of IV nitroglycerin and obtaining CTA chest to evaluate for dissection (very high BP on presentation) and pulmonary embolism (recent hospitalization for pneumonia). Continue clopidogrel 75 mg daily, given history of aspirin intolerance. Restart IV heparin in 2 hours; recommend completing 48 hours of heparin (if no contraindication develops) for medical management of NSTEMI. Follow-up echocardiogram. Aggressive secondary prevention of coronary artery disease.  Consider rechallenging with statin versus outpatient trial of PCSK-9 inhibitor. Consider gentle diuresis. Yvonne Kendall, MD [facility]   EKG: Sinus rhythm. Lateral ST depressions of ~ 1 mm.   ASSESSMENT AND PLAN:  Andrea Phillips is an 87 year old female with a history of HFrEF (25-30%), frequent PVCs, hypertension, hyperlipidemia, OSA who presented to the hospital on 10/24/2021 for evaluation of chest pain and was discovered to have an NSTEMI with a troponin greater than 24,000.Marland Kitchen  #NSTEMI, chest pain The patient presents with several hours of ongoing severe chest pain, and subsequently discovered to have a troponin that rose from near normal to greater than 24,000 over 6-hour period.  EKG showed lateral ST depressions but otherwise was nondiagnostic.  Heart catheterization showed no clear culprit for this aside from a possibly occluded small OM 3 vessel. -We will hold on aspirin given previous allergy -s/p Plavix 300 mg.  Continue 75 mg daily indefinitely -Continue heparin infusion x48 hours -Agree with initiation of metoprolol XL 25 mg; takes 50 mg at home. Will aim to increase back to this prior to DC -Patient has listed allergy to atorvastatin; she states she doesn't like that medicine and wont take it. Says she had hives to crestor.   -  Start pravastatin 20 mg daily -Continue Vascepa 2 g twice daily at discharge -Echocardiogram complete today.  #Chronic systolic heart failure (EF 25-30%) -Metoprolol XL as above -On telmisartan 80 mg daily at home; will hold for now rapid decrease in blood pressure overnight.   Signed: Armando Reichert MD 10/25/2021, 8:03 AM

## 2021-10-25 NOTE — Progress Notes (Signed)
ANTICOAGULATION CONSULT NOTE  Pharmacy Consult for Heparin Drip Indication: chest pain/ACS  Patient Measurements: Height: 5' (152.4 cm) Weight: 69.9 kg (154 lb 3.2 oz) IBW/kg (Calculated) : 45.5 Heparin Dosing Weight: 60.8 kg  Labs: Recent Labs    10/24/21 1226 10/24/21 1727 10/24/21 1929 10/25/21 1128 10/25/21 2050  HGB 14.0  --   --   --   --   HCT 42.6  --   --   --   --   PLT 288  --   --   --   --   APTT  --   --  >200*  --   --   LABPROT 13.0  --   --   --   --   INR 1.0  --   --   --   --   HEPARINUNFRC  --   --   --  0.16* 0.34  CREATININE 0.94  --   --   --   --   TROPONINIHS 87* >24,000*  --   --   --      Estimated Creatinine Clearance: 38.9 mL/min (by C-G formula based on SCr of 0.94 mg/dL).   Medical History: Past Medical History:  Diagnosis Date   Arthritis    ra   Asthma    CHF (congestive heart failure) (HCC)    Dysrhythmia    Edema extremities    GERD (gastroesophageal reflux disease)    Gout    Headache    History of hiatal hernia    Hypertension    Hypothyroidism    Shortness of breath dyspnea    Sleep apnea     Assessment: Patient is a 87yo female admitted with chest pain. Pharmacy consulted for heparin dosing. No prior to admission anticoagulants noted.  Patient is status post cardiac catheterization on 10/24/21 with findings of severe single-vessel CAD with occlusion of distal OM3 branch non amenable to intervention.  Goal of Therapy:  Heparin level 0.3-0.7 units/ml Monitor platelets by anticoagulation protocol: Yes   Plan:  --1/2@2050 : HL 0.34, therapeutic x1 --Continue heparin infusion at 950 units/hr --Check confirmatory HL in 8 hours --Daily CBC per protocol while on IV heparin --Plan to continue IV heparin x 48 hours per cardiology for medical management of ACS   Bettey Costa  10/25/2021 9:25 PM

## 2021-10-25 NOTE — Progress Notes (Signed)
PROGRESS NOTE    Andrea Phillips  [id number] DOB: [birth year] DOA: 10/24/2021 PCP: Perrin Maltese, MD    Brief Narrative:  87 y.o. female with medical history significant for heart failure with reduced ejection fraction NYHA class III, frequent PVCs, GERD, hypothyroid, hypertension, obstructive sleep apnea on CPAP, history of recent left lower lobe pneumonia, who presents to the emergency department for chief concerns of chest pain that goes to her back and radiates to her right arm.   She woke up at 11 am and had chest pain. She describes the chest pain as pressured, 9/10, persistent, and does not go away. She reports currently the pain is a 7/10. She denies any new abnormal stressors in her life.  She reports she is never felt this way before.  She denies any other symptoms including radiation to her extremities.   She endorses shortness of breath. She endorses some nausea and denies vomiting. She denies fever, chills, new cough, abdominal pain, diarrhea, dysuria, hematuria.    She states her last bowel movement was 10/23/21 and it was normal.    She did have a small amount of wine the previous night for New Years. She states it was about 2-3 oz.   Given her symptomatology interventional cardiology was contacted.  Patient underwent urgent cardiac catheterization on 1/1.  No clear etiology or culprit lesion was identified.  Small occluded OM1 branch.  Not amenable to PCI.  Recommend heparin GTT and medical management.  Seen in follow-up by cardiology.   Assessment & Plan:   Principal Problem:   NSTEMI (non-ST elevated myocardial infarction) ([place]) Active Problems:   Essential hypertension   Frequent PVCs   OSA (obstructive sleep apnea)   Elevated troponin   Acute on chronic systolic CHF (congestive heart failure) (H[facility])   Hypothyroidism   Hyperlipidemia, mixed   H/O medication noncompliance  NSTEMI Chest pain Initial presentation included several hours of ongoing chest  pain Significant delta and high-sensitivity troponin EKG with lateral ST depressions Urgent left heart cath no clear culprit Possibly occluded OM 3 vessel Plan: Transfer to cardiac telemetry Defer aspirin given allergy Status post Plavix load, continue 75 mg daily Heparin infusion x48 hours Metoprolol XL 25 mg daily with goal to increase to 50 before discharge Pravastatin 20 mg daily Vascepa 2 g twice daily at time of discharge Transthoracic echocardiogram Cardiology follow-up  Chronic systolic congestive heart failure Toprol-XL as above ARB currently on hold Cardiology following  Obstructive sleep apnea Nightly CPAP  Hypothyroidism Synthroid 112 mcg daily     DVT prophylaxis: Heparin GTT Code Status: Full Family Communication: Sister [name] [phone] on 1/2 Disposition Plan: Status is: Inpatient  Remains inpatient appropriate because: NSTEMI on heparin gtt.       Level of care: Telemetry Cardiac  Consultants:  Cardiology  Procedures:  Left heart catheterization 1/1  Antimicrobials: None   Subjective: Seen and examined.  Resting comfortably in bed.  No visible distress.  Chest pain-free  Objective: Vitals:   10/25/21 0600 10/25/21 0900 10/25/21 0948 10/25/21 1200  BP: 127/61 (!) 130/57 (!) 130/57 111/77  Pulse: 75 63 70 62  Resp: 16 14  18   Temp:  98.8 F (37.1 C)  98 F (36.7 C)  TempSrc:  Oral  Oral  SpO2: 94% 97%  99%  Weight:      Height:        Intake/Output Summary (Last 24 hours) at 10/25/2021 1356 Last data filed at 10/25/2021 1343 Gross per 24 hour  Intake  884.56 ml  Output 400 ml  Net 484.56 ml   Filed Weights   10/24/21 1222  Weight: 69.9 kg    Examination:  General exam: Appears calm and comfortable  Respiratory system: Clear to auscultation. Respiratory effort normal. Cardiovascular system: S1-S2, RRR, no murmurs, no pedal edema Gastrointestinal system: Soft, NT/ND, normal bowel sounds Central nervous system:  Alert and oriented. No focal neurological deficits. Extremities: Symmetric 5 x 5 power. Skin: No rashes, lesions or ulcers Psychiatry: Judgement and insight appear normal. Mood & affect appropriate.     Data Reviewed: I have personally reviewed following labs and imaging studies  CBC: Recent Labs  Lab 10/24/21 1226  WBC 6.3  HGB 14.0  HCT 42.6  MCV 90.8  PLT [id number]   Basic Metabolic Panel: Recent Labs  Lab 10/24/21 1226 10/24/21 1929  NA 136  --   K 3.9  --   CL 104  --   CO2 28  --   GLUCOSE 117*  --   BUN 20  --   CREATININE 0.94  --   CALCIUM 9.9  --   MG  --  2.2   GFR: Estimated Creatinine Clearance: 38.9 mL/min (by C-G formula based on SCr of 0.94 mg/dL). Liver Function Tests: No results for input(s): AST, ALT, ALKPHOS, BILITOT, PROT, ALBUMIN in the last 168 hours. No results for input(s): LIPASE, AMYLASE in the last 168 hours. No results for input(s): AMMONIA in the last 168 hours. Coagulation Profile: Recent Labs  Lab 10/24/21 1226  INR 1.0   Cardiac Enzymes: No results for input(s): CKTOTAL, CKMB, CKMBINDEX, TROPONINI in the last 168 hours. BNP (last 3 results) No results for input(s): PROBNP in the last 8760 hours. HbA1C: Recent Labs    10/25/21 0415  HGBA1C 5.2   CBG: No results for input(s): GLUCAP in the last 168 hours. Lipid Profile: Recent Labs    10/25/21 0415  CHOL 185  HDL 49  LDLCALC 116*  TRIG 99  CHOLHDL 3.8   Thyroid Function Tests: No results for input(s): TSH, T4TOTAL, FREET4, T3FREE, THYROIDAB in the last 72 hours. Anemia Panel: Recent Labs    10/24/21 1929  VITAMINB12 389   Sepsis Labs: Recent Labs  Lab 10/24/21 1929  PROCALCITON <0.10    Recent Results (from the past 240 hour(s))  Resp Panel by RT-PCR (Flu A&B, Covid) Nasopharyngeal Swab     Status: None   Collection Time: 10/24/21  7:29 PM   Specimen: Nasopharyngeal Swab; Nasopharyngeal(NP) swabs in vial transport medium  Result Value Ref Range Status    SARS Coronavirus 2 by RT PCR NEGATIVE NEGATIVE Final    Comment: (NOTE) SARS-CoV-2 target nucleic acids are NOT DETECTED.  The SARS-CoV-2 RNA is generally detectable in upper respiratory specimens during the acute phase of infection. The lowest concentration of SARS-CoV-2 viral copies this assay can detect is 138 copies/mL. A negative result does not preclude SARS-Cov-2 infection and should not be used as the sole basis for treatment or other patient management decisions. A negative result may occur with  improper specimen collection/handling, submission of specimen other than nasopharyngeal swab, presence of viral mutation(s) within the areas targeted by this assay, and inadequate number of viral copies(<138 copies/mL). A negative result must be combined with clinical observations, patient history, and epidemiological information. The expected result is Negative.  Fact Sheet for Patients:  EntrepreneurPulse.com.au  Fact Sheet for Healthcare Providers:  IncredibleEmployment.be  This test is no t yet approved or cleared by the [place] FDA  and  has been authorized for detection and/or diagnosis of SARS-CoV-2 by FDA under an Emergency Use Authorization (EUA). This EUA will remain  in effect (meaning this test can be used) for the duration of the COVID-19 declaration under Section 564(b)(1) of the Act, 21 U.S.C.section 360bbb-3(b)(1), unless the authorization is terminated  or revoked sooner.       Influenza A by PCR NEGATIVE NEGATIVE Final   Influenza B by PCR NEGATIVE NEGATIVE Final    Comment: (NOTE) The Xpert Xpress SARS-CoV-2/FLU/RSV plus assay is intended as an aid in the diagnosis of influenza from Nasopharyngeal swab specimens and should not be used as a sole basis for treatment. Nasal washings and aspirates are unacceptable for Xpert Xpress SARS-CoV-2/FLU/RSV testing.  Fact Sheet for  Patients: EntrepreneurPulse.com.au  Fact Sheet for Healthcare Providers: IncredibleEmployment.be  This test is not yet approved or cleared by the [place] FDA and has been authorized for detection and/or diagnosis of SARS-CoV-2 by FDA under an Emergency Use Authorization (EUA). This EUA will remain in effect (meaning this test can be used) for the duration of the COVID-19 declaration under Section 564(b)(1) of the Act, 21 U.S.C. section 360bbb-3(b)(1), unless the authorization is terminated or revoked.  Performed at [facility], [address] [place], [place] [address]   MRSA Next Gen by PCR, Nasal     Status: None   Collection Time: 10/25/21 11:24 AM   Specimen: Nasal Mucosa; Nasal Swab  Result Value Ref Range Status   MRSA by PCR Next Gen NOT DETECTED NOT DETECTED Final    Comment: (NOTE) The GeneXpert MRSA Assay (FDA approved for NASAL specimens only), is one component of a comprehensive MRSA colonization surveillance program. It is not intended to diagnose MRSA infection nor to guide or monitor treatment for MRSA infections. Test performance is not FDA approved in patients less than 7 years old. Performed at [facility], [address], [place], [place] [address]          Radiology Studies: DG Chest 2 View  Result Date: 10/24/2021 CLINICAL DATA:  87 year old female with history of shortness of breath and chest pain. EXAM: CHEST - 2 VIEW COMPARISON:  Chest x-ray 08/29/2021. FINDINGS: Lung volumes are normal. Persistent areas of peribronchial cuffing, interstitial prominence an ill-defined airspace disease throughout the mid to lower lungs bilaterally. No pleural effusions. No pneumothorax. No pulmonary nodule or mass noted. Pulmonary vasculature and the cardiomediastinal silhouette are within normal limits. Atherosclerosis in the thoracic aorta. IMPRESSION: 1. Persistent bilateral lower lobe bronchopneumonia,  with slightly improved aeration in the right lower lobe compared to the prior examination. Electronically Signed   By: Vinnie Langton M.D.   On: 10/24/2021 13:21   CARDIAC CATHETERIZATION  Result Date: 10/24/2021 Conclusions: Severe single-vessel coronary artery disease with occlusion of distal OM3 branch, which is too small/distal for intervention.  There is also mild plaquing in the mid LAD with up to 20% stenosis. Moderately elevated left ventricular filling pressure (LVEDP 25 mmHg). Recommendation: Overnight observation in stepdown unit.  If patient has recurrent chest pain, recommend initiation of IV nitroglycerin and obtaining CTA chest to evaluate for dissection (very high BP on presentation) and pulmonary embolism (recent hospitalization for pneumonia). Continue clopidogrel 75 mg daily, given history of aspirin intolerance. Restart IV heparin in 2 hours; recommend completing 48 hours of heparin (if no contraindication develops) for medical management of NSTEMI. Follow-up echocardiogram. Aggressive secondary prevention of coronary artery disease.  Consider rechallenging with statin versus outpatient trial of PCSK-9  inhibitor. Consider gentle diuresis. Nelva Bush, MD [facility]  CT ANGIO CHEST AORTA W/CM &/OR WO/CM  Result Date: 10/25/2021 CLINICAL DATA:  Evaluate for acute aortic syndrome. Chest pain radiating into right arm. EXAM: CT ANGIOGRAPHY CHEST WITH CONTRAST TECHNIQUE: Multidetector CT imaging of the chest was performed using the standard protocol during bolus administration of intravenous contrast. Multiplanar CT image reconstructions and MIPs were obtained to evaluate the vascular anatomy. CONTRAST:  29mL OMNIPAQUE IOHEXOL 350 MG/ML SOLN COMPARISON:  10/11/2021 FINDINGS: Cardiovascular: No aortic intramural hemotoma. Preferential opacification of the thoracic aorta. No evidence of thoracic aortic aneurysm or dissection. Normal heart size. No pericardial effusion. Aortic  atherosclerosis and mild coronary artery calcifications. The main pulmonary artery appears patent. No signs of central obstructing pulmonary embolus. Mediastinum/Nodes: Thyroid gland appears atrophic or surgically absent. Trachea is patent and midline. Normal appearance of the esophagus. No enlarged axillary, supraclavicular, mediastinal, or hilar lymph nodes. Lungs/Pleura: No pleural effusion. Stable bandlike area of scarring within the posteromedial left lower lobe. Similar appearance of right lower lobe peripheral and basilar predominant areas of subsegmental atelectasis and subpleural consolidation. Findings compatible with postinflammatory or infectious process. Upper Abdomen: No acute abnormality. Aortic atherosclerosis. Hiatal hernia. Status post cholecystectomy. Unchanged appearance of left portal hepatic venous shunt within segment 3 of the liver, image 92/4. Musculoskeletal: Spondylosis identified within the thoracic spine. No acute or suspicious osseous findings. Review of the MIP images confirms the above findings. IMPRESSION: 1. No evidence for acute aortic syndrome. 2. Similar appearance of peripheral and basilar predominant areas of subsegmental atelectasis and subpleural consolidation with thickening of the peribronchovascular interstitium compatible with postinflammatory or infectious process. Suggest continued interval follow-up with repeat CT of the chest in 3 months to ensure complete resolution. 3. Aortic Atherosclerosis (ICD10-I70.0). Coronary artery calcifications. Electronically Signed   By: Kerby Moors M.D.   On: 10/25/2021 08:26   ECHOCARDIOGRAM COMPLETE  Result Date: 10/25/2021    ECHOCARDIOGRAM REPORT   Patient Name:   Andrea Phillips Date of Exam: 10/25/2021 Medical Rec #:  [id number]           Height:       60.0 in Accession #:    [id number]          Weight:       154.2 lb Date of Birth:  1935/07/18           BSA:          1.671 m Patient Age:    62 years            BP:            130/57 mmHg Patient Gender: F                   HR:           68 bpm. Exam Location:  [facility] Procedure: 2D Echo, Color Doppler, Cardiac Doppler and Intracardiac            Opacification Agent Indications:     R07.9 Chest Pain; Elevated troponin  History:         Patient has prior history of Echocardiogram examinations, most                  recent 04/02/2021. CHF; Risk Factors:Hypertension and Sleep                  Apnea.  Sonographer:     Charmayne Sheer Referring Phys:  [id number] AMY N [facility] Diagnosing Phys:  Donnelly Angelica  Sonographer Comments: Suboptimal apical window and no subcostal window. IMPRESSIONS  1. Left ventricular ejection fraction, by estimation, is 50 to 55%. The left ventricle has low normal function. The left ventricle has no regional wall motion abnormalities. Left ventricular diastolic parameters are consistent with Grade II diastolic dysfunction (pseudonormalization).  2. Right ventricular systolic function is normal. The right ventricular size is normal.  3. Left atrial size was mildly dilated.  4. The mitral valve is normal in structure. No evidence of mitral valve regurgitation. No evidence of mitral stenosis.  5. The aortic valve is grossly normal. Aortic valve regurgitation is not visualized. No aortic stenosis is present. FINDINGS  Left Ventricle: Left ventricular ejection fraction, by estimation, is 50 to 55%. The left ventricle has low normal function. The left ventricle has no regional wall motion abnormalities. Definity contrast agent was given IV to delineate the left ventricular endocardial borders. The left ventricular internal cavity size was normal in size. There is no left ventricular hypertrophy. Left ventricular diastolic parameters are consistent with Grade II diastolic dysfunction (pseudonormalization). Right Ventricle: The right ventricular size is normal. Right vetricular wall thickness was not well visualized. Right ventricular systolic function is normal. Left Atrium: Left atrial size  was mildly dilated. Right Atrium: Right atrial size was not well visualized. Pericardium: There is no evidence of pericardial effusion. Mitral Valve: The mitral valve is normal in structure. No evidence of mitral valve regurgitation. No evidence of mitral valve stenosis. MV peak gradient, 3.3 mmHg. The mean mitral valve gradient is 2.0 mmHg. Tricuspid Valve: The tricuspid valve is not well visualized. Tricuspid valve regurgitation is not demonstrated. Aortic Valve: The aortic valve is grossly normal. Aortic valve regurgitation is not visualized. No aortic stenosis is present. Aortic valve mean gradient measures 6.0 mmHg. Aortic valve peak gradient measures 10.1 mmHg. Aortic valve area, by VTI measures  1.53 cm. Pulmonic Valve: The pulmonic valve was not well visualized. Pulmonic valve regurgitation is not visualized. Aorta: The aortic root is normal in size and structure. Venous: The inferior vena cava was not well visualized. IAS/Shunts: The interatrial septum was not well visualized.  LEFT VENTRICLE PLAX 2D LVIDd:         4.66 cm      Diastology LVIDs:         3.66 cm      LV e' medial:    5.44 cm/s LV PW:         1.16 cm      LV E/e' medial:  16.7 LV IVS:        0.75 cm      LV e' lateral:   5.87 cm/s LVOT diam:     2.00 cm      LV E/e' lateral: 15.5 LV SV:         46 LV SV Index:   27 LVOT Area:     3.14 cm  LV Volumes (MOD) LV vol d, MOD A2C: 80.3 ml LV vol d, MOD A4C: 113.0 ml LV vol s, MOD A2C: 48.5 ml LV vol s, MOD A4C: 56.6 ml LV SV MOD A2C:     31.8 ml LV SV MOD A4C:     113.0 ml LV SV MOD BP:      45.1 ml LEFT ATRIUM             Index LA diam:        3.80 cm 2.27 cm/m LA Vol (A2C):   51.6 ml 30.87 ml/m LA  Vol (A4C):   81.3 ml 48.65 ml/m LA Biplane Vol: 65.9 ml 39.43 ml/m  AORTIC VALVE                     PULMONIC VALVE AV Area (Vmax):    1.44 cm      PV Vmax:       0.86 m/s AV Area (Vmean):   1.21 cm      PV Vmean:      56.700 cm/s AV Area (VTI):     1.53 cm      PV VTI:        0.159 m AV Vmax:            159.00 cm/s   PV Peak grad:  3.0 mmHg AV Vmean:          119.000 cm/s  PV Mean grad:  1.0 mmHg AV VTI:            0.300 m AV Peak Grad:      10.1 mmHg AV Mean Grad:      6.0 mmHg LVOT Vmax:         73.10 cm/s LVOT Vmean:        45.900 cm/s LVOT VTI:          0.146 m LVOT/AV VTI ratio: 0.49  AORTA Ao Root diam: 2.50 cm MITRAL VALVE               TRICUSPID VALVE MV Area (PHT): 2.94 cm    TR Peak grad:   25.4 mmHg MV Area VTI:   1.44 cm    TR Vmax:        252.00 cm/s MV Peak grad:  3.3 mmHg MV Mean grad:  2.0 mmHg    SHUNTS MV Vmax:       0.91 m/s    Systemic VTI:  0.15 m MV Vmean:      60.2 cm/s   Systemic Diam: 2.00 cm MV Decel Time: 258 msec MV E velocity: 90.80 cm/s MV A velocity: 80.60 cm/s MV E/A ratio:  1.13 Donnelly Angelica Electronically signed by Donnelly Angelica Signature Date/Time: 10/25/2021/1:09:32 PM    Final         Scheduled Meds:  Chlorhexidine Gluconate Cloth  6 each Topical Daily   clopidogrel  75 mg Oral Q breakfast   fentaNYL (SUBLIMAZE) injection  12.5 mcg Intravenous Once   irbesartan  300 mg Oral Daily   levothyroxine  112 mcg Oral Q0600   metoprolol succinate  25 mg Oral Daily   mometasone-formoterol  2 puff Inhalation BID   pravastatin  20 mg Oral q1800   sodium chloride flush  3 mL Intravenous Q12H   Continuous Infusions:  sodium chloride     heparin 950 Units/hr (10/25/21 1343)     LOS: 1 day    Time spent: 35 minutes    Sidney Ace, MD [facility]   If 7PM-7AM, please contact night-coverage  10/25/2021, 1:56 PM

## 2021-10-25 NOTE — Consult Note (Signed)
PULMONOLOGY         Date: 10/25/2021,   MRN# [id number] Andrea Phillips 10/12/1935     AdmissionWeight: 69.9 kg                 CurrentWeight: 69.9 kg   Referring physician: Dr Georgeann Oppenheim   CHIEF COMPLAINT:   COPD/Asthma overlap with OSA   HISTORY OF PRESENT ILLNESS   This is a 87 yo F with hx of Asthma and COPD overlap syndrome, GERD, OSA, remote LLL pna admission came in due to CP and is seen in SDU with workgin diagnosis of NSTEMI.  She is now s/p medical treatment and is improving with ongoing chronic COPD/Asthma and OSA. PCCM consultation to establish pulmonology care and manage multiple pulmonary comorbidities.    PAST MEDICAL HISTORY   Past Medical History:  Diagnosis Date   Arthritis    ra   Asthma    CHF (congestive heart failure) (HCC)    Dysrhythmia    Edema extremities    GERD (gastroesophageal reflux disease)    Gout    Headache    History of hiatal hernia    Hypertension    Hypothyroidism    Shortness of breath dyspnea    Sleep apnea      SURGICAL HISTORY   Past Surgical History:  Procedure Laterality Date   APPENDECTOMY     BACK SURGERY     CARDIAC CATHETERIZATION     CATARACT EXTRACTION W/PHACO Left 03/09/2015   Procedure: CATARACT EXTRACTION PHACO AND INTRAOCULAR LENS PLACEMENT (IOC);  Surgeon: Sallee Lange, MD;  Location: [facility] OR[facility];  Service: Ophthalmology;  Laterality: Left;  [place] 01:31 AP% 26.1 CDE 43.72   CATARACT EXTRACTION W/PHACO Right 08/19/2021   Procedure: CATARACT EXTRACTION PHACO AND INTRAOCULAR LENS PLACEMENT (IOC) RIGHT;  Surgeon: Galen Manila, MD;  Location: [facility];  Service: Ophthalmology;  Laterality: Right;  12.31 1:07.0   CATARACT EXTRACTION W/PHACO Right 09/07/2021   Procedure: REMOVAL OF LENS FRAGMENTS RIGHT;  Surgeon: Galen Manila, MD;  Location: [facility];  Service: Ophthalmology;  Laterality: Right;   CHOLECYSTECTOMY     EYE SURGERY     retina   JOINT REPLACEMENT     left knee    LEFT HEART CATH AND CORONARY ANGIOGRAPHY N/A 04/02/2021   Procedure: LEFT HEART CATH AND CORONARY ANGIOGRAPHY;  Surgeon: Lamar Blinks, MD;  Location: [facility];  Service: Cardiovascular;  Laterality: N/A;     FAMILY HISTORY   Family History  Problem Relation Age of Onset   Heart disease Mother    Heart disease Father    Breast cancer Cousin      SOCIAL HISTORY   Social History   Tobacco Use   Smoking status: Never   Smokeless tobacco: Never  Substance Use Topics   Alcohol use: No   Drug use: No     MEDICATIONS    Home Medication:    Current Medication:  Current Facility-Administered Medications:    0.9 %  sodium chloride infusion, 250 mL, Intravenous, PRN, Cox, Amy N, DO   acetaminophen (TYLENOL) tablet 650 mg, 650 mg, Oral, Q4H PRN, Cox, Amy N, DO   albuterol (VENTOLIN HFA) 108 (90 Base) MCG/ACT inhaler 2 puff, 2 puff, Inhalation, Q6H PRN, Cox, Amy N, DO   Chlorhexidine Gluconate Cloth 2 % PADS 6 each, 6 each, Topical, Daily, Sreenath, Sudheer B, MD   clopidogrel (PLAVIX) tablet 75 mg, 75 mg, Oral, Q breakfast, Cox, Amy N, DO  fentaNYL (SUBLIMAZE) injection 12.5 mcg, 12.5 mcg, Intravenous, Once, Eduard Clos, MD   heparin ADULT infusion 100 units/mL (25000 units/277mL), 750 Units/hr, Intravenous, Continuous, Belue, Lendon Collar, RPH, Last Rate: 7.5 mL/hr at 10/25/21 0400, 750 Units/hr at 10/25/21 0400   irbesartan (AVAPRO) tablet 300 mg, 300 mg, Oral, Daily, Cox, Amy N, DO, 300 mg at 10/24/21 1935   levothyroxine (SYNTHROID) tablet 112 mcg, 112 mcg, Oral, Q0600, Cox, Amy N, DO, 112 mcg at 10/25/21 0548   meclizine (ANTIVERT) tablet 25 mg, 25 mg, Oral, BID PRN, Cox, Amy N, DO   metoprolol succinate (TOPROL-XL) 24 hr tablet 25 mg, 25 mg, Oral, Daily, Cox, Amy N, DO   mometasone-formoterol (DULERA) 200-5 MCG/ACT inhaler 2 puff, 2 puff, Inhalation, BID, Cox, Amy N, DO   nitroGLYCERIN (NITROSTAT) SL tablet 0.4 mg, 0.4 mg, Sublingual, Q5 min PRN, Cox,  Amy N, DO, 0.4 mg at 10/25/21 0334   ondansetron (ZOFRAN) injection 4 mg, 4 mg, Intravenous, Q6H PRN, Cox, Amy N, DO   sodium chloride flush (NS) 0.9 % injection 3 mL, 3 mL, Intravenous, Q12H, Cox, Amy N, DO, 3 mL at 10/24/21 2249   sodium chloride flush (NS) 0.9 % injection 3 mL, 3 mL, Intravenous, PRN, Cox, Amy N, DO    ALLERGIES   Aspirin, Other, Atorvastatin, Codeine, Conj estrog-medroxyprogest ace, Prednisone, Raloxifene, Ace inhibitors, Amlodipine, and Valsartan     REVIEW OF SYSTEMS    Review of Systems:  Gen:  Denies  fever, sweats, chills weigh loss  HEENT: Denies blurred vision, double vision, ear pain, eye pain, hearing loss, nose bleeds, sore throat Cardiac:  No dizziness, chest pain or heaviness, chest tightness,edema Resp:   Denies cough or sputum porduction, shortness of breath,wheezing, hemoptysis,  Gi: Denies swallowing difficulty, stomach pain, nausea or vomiting, diarrhea, constipation, bowel incontinence Gu:  Denies bladder incontinence, burning urine Ext:   Denies Joint pain, stiffness or swelling Skin: Denies  skin rash, easy bruising or bleeding or hives Endoc:  Denies polyuria, polydipsia , polyphagia or weight change Psych:   Denies depression, insomnia or hallucinations   Other:  All other systems negative   VS: BP 127/61    Pulse 75    Temp 98.3 F (36.8 C) (Oral)    Resp 16    Ht 5' (1.524 m)    Wt 69.9 kg    SpO2 94%    BMI 30.12 kg/m      PHYSICAL EXAM    GENERAL:NAD, no fevers, chills, no weakness no fatigue HEAD: Normocephalic, atraumatic.  EYES: Pupils equal, round, reactive to light. Extraocular muscles intact. No scleral icterus.  MOUTH: Moist mucosal membrane. Dentition intact. No abscess noted.  EAR, NOSE, THROAT: Clear without exudates. No external lesions.  NECK: Supple. No thyromegaly. No nodules. No JVD.  PULMONARY: mild rhonchi bilaterally  CARDIOVASCULAR: S1 and S2. Regular rate and rhythm. No murmurs, rubs, or gallops. No  edema. Pedal pulses 2+ bilaterally.  GASTROINTESTINAL: Soft, nontender, nondistended. No masses. Positive bowel sounds. No hepatosplenomegaly.  MUSCULOSKELETAL: No swelling, clubbing, or edema. Range of motion full in all extremities.  NEUROLOGIC: Cranial nerves II through XII are intact. No gross focal neurological deficits. Sensation intact. Reflexes intact.  SKIN: No ulceration, lesions, rashes, or cyanosis. Skin warm and dry. Turgor intact.  PSYCHIATRIC: Mood, affect within normal limits. The patient is awake, alert and oriented x 3. Insight, judgment intact.       IMAGING    DG Chest 2 View  Result Date: 10/24/2021 CLINICAL DATA:  87 year old female with history of shortness of breath and chest pain. EXAM: CHEST - 2 VIEW COMPARISON:  Chest x-ray 08/29/2021. FINDINGS: Lung volumes are normal. Persistent areas of peribronchial cuffing, interstitial prominence an ill-defined airspace disease throughout the mid to lower lungs bilaterally. No pleural effusions. No pneumothorax. No pulmonary nodule or mass noted. Pulmonary vasculature and the cardiomediastinal silhouette are within normal limits. Atherosclerosis in the thoracic aorta. IMPRESSION: 1. Persistent bilateral lower lobe bronchopneumonia, with slightly improved aeration in the right lower lobe compared to the prior examination. Electronically Signed   By: Trudie Reed M.D.   On: 10/24/2021 13:21   CT CHEST W CONTRAST  Result Date: 10/11/2021 CLINICAL DATA:  Follow-up pneumonia, productive cough and congestion since November 2022 EXAM: CT CHEST WITH CONTRAST TECHNIQUE: Multidetector CT imaging of the chest was performed during intravenous contrast administration. CONTRAST:  75mL OMNIPAQUE IOHEXOL 300 MG/ML  SOLN IV COMPARISON:  04/07/2020 CT chest, chest radiographs 08/29/2021 FINDINGS: Cardiovascular: Atherosclerotic calcifications aorta, proximal great vessels, and coronary arteries. Aorta normal caliber without aneurysm. Vascular  structures grossly patent on non targeted exam. Dilatation of cardiac chambers. No pericardial effusion. Mediastinum/Nodes: Small hiatal hernia. Esophagus stable, unremarkable. Base of cervical region normal appearance. No thoracic adenopathy. Lungs/Pleura: Subsegmental atelectasis in BILATERAL lower lobes. Patchy infiltrates RIGHT lower lobe, improved since prior chest radiograph. Remaining lungs clear. No pleural effusion or pneumothorax. Upper Abdomen: Visualized upper abdomen unremarkable Musculoskeletal: No acute osseous findings. IMPRESSION: Patchy infiltrates RIGHT lower lobe, improved since prior chest radiograph. Bibasilar subsegmental atelectasis. Small hiatal hernia. Scattered atherosclerotic calcifications including coronary arteries. Aortic Atherosclerosis (ICD10-I70.0). Electronically Signed   By: Ulyses Southward M.D.   On: 10/11/2021 16:30   CARDIAC CATHETERIZATION  Result Date: 10/24/2021 Conclusions: Severe single-vessel coronary artery disease with occlusion of distal OM3 branch, which is too small/distal for intervention.  There is also mild plaquing in the mid LAD with up to 20% stenosis. Moderately elevated left ventricular filling pressure (LVEDP 25 mmHg). Recommendation: Overnight observation in stepdown unit.  If patient has recurrent chest pain, recommend initiation of IV nitroglycerin and obtaining CTA chest to evaluate for dissection (very high BP on presentation) and pulmonary embolism (recent hospitalization for pneumonia). Continue clopidogrel 75 mg daily, given history of aspirin intolerance. Restart IV heparin in 2 hours; recommend completing 48 hours of heparin (if no contraindication develops) for medical management of NSTEMI. Follow-up echocardiogram. Aggressive secondary prevention of coronary artery disease.  Consider rechallenging with statin versus outpatient trial of PCSK-9 inhibitor. Consider gentle diuresis. Yvonne Kendall, MD [facility]  CT ANGIO CHEST AORTA W/CM &/OR  WO/CM  Result Date: 10/25/2021 CLINICAL DATA:  Evaluate for acute aortic syndrome. Chest pain radiating into right arm. EXAM: CT ANGIOGRAPHY CHEST WITH CONTRAST TECHNIQUE: Multidetector CT imaging of the chest was performed using the standard protocol during bolus administration of intravenous contrast. Multiplanar CT image reconstructions and MIPs were obtained to evaluate the vascular anatomy. CONTRAST:  75mL OMNIPAQUE IOHEXOL 350 MG/ML SOLN COMPARISON:  10/11/2021 FINDINGS: Cardiovascular: No aortic intramural hemotoma. Preferential opacification of the thoracic aorta. No evidence of thoracic aortic aneurysm or dissection. Normal heart size. No pericardial effusion. Aortic atherosclerosis and mild coronary artery calcifications. The main pulmonary artery appears patent. No signs of central obstructing pulmonary embolus. Mediastinum/Nodes: Thyroid gland appears atrophic or surgically absent. Trachea is patent and midline. Normal appearance of the esophagus. No enlarged axillary, supraclavicular, mediastinal, or hilar lymph nodes. Lungs/Pleura: No pleural effusion. Stable bandlike area of scarring within the posteromedial left lower lobe. Similar appearance  of right lower lobe peripheral and basilar predominant areas of subsegmental atelectasis and subpleural consolidation. Findings compatible with postinflammatory or infectious process. Upper Abdomen: No acute abnormality. Aortic atherosclerosis. Hiatal hernia. Status post cholecystectomy. Unchanged appearance of left portal hepatic venous shunt within segment 3 of the liver, image 92/4. Musculoskeletal: Spondylosis identified within the thoracic spine. No acute or suspicious osseous findings. Review of the MIP images confirms the above findings. IMPRESSION: 1. No evidence for acute aortic syndrome. 2. Similar appearance of peripheral and basilar predominant areas of subsegmental atelectasis and subpleural consolidation with thickening of the peribronchovascular  interstitium compatible with postinflammatory or infectious process. Suggest continued interval follow-up with repeat CT of the chest in 3 months to ensure complete resolution. 3. Aortic Atherosclerosis (ICD10-I70.0). Coronary artery calcifications. Electronically Signed   By: Signa Kell M.D.   On: 10/25/2021 08:26      ASSESSMENT/PLAN   Chronic Asthma with COPD Overlap syndrome (ACOS)_     - patient currently on 2L/min [place] with slight improvement    - patient does see Dr Meredeth Ide on outpatient and has refills available    - Dulera 200-5 BID   -PRN fentanyl for chronic pain syndrome should help with dyspnea and air hunger   - PT/OT and incentive spirometry    Moderate OSA on CPAP   - patient does not have O2 at home but currently requires 2L/min [place] which may be weaned down.     - She has CPAP and supplies ordered via [employer] medical supplier   - I left contact information and made follow up arrangemetns with patient for outpatient pulmonology follow up.      Thank you for allowing me to participate in the care of this patient.  Total face to face encounter time for this patient visit was >45 min. >50% of the time was  spent in counseling and coordination of care.   Patient/Family are satisfied with care plan and all questions have been answered.  This document was prepared using Dragon voice recognition software and may include unintentional dictation errors.     Vida Rigger, M.D.  Division of Pulmonary & Critical Care Medicine  [facility]

## 2021-10-25 NOTE — TOC Progression Note (Signed)
Transition of Care [facility]) - Progression Note    Patient Details  Name: Andrea Phillips MRN: [id number] Date of Birth: [birth year]  Transition of Care [facility]) CM/SW Contact  Allayne Butcher, RN Phone Number: 10/25/2021, 11:48 AM  Clinical Narrative:     Transition of Care [facility]) Screening Note   Patient Details  Name: Andrea Phillips Date of Birth: 1935-06-19   Transition of Care [facility]) CM/SW Contact:    Allayne Butcher, RN Phone Number: 10/25/2021, 11:48 AM    Transition of Care Department [facility]) has reviewed patient and no TOC needs have been identified at this time. We will continue to monitor patient advancement through interdisciplinary progression rounds. If new patient transition needs arise, please place a TOC consult.          Expected Discharge Plan and Services                                                 Social Determinants of Health (SDOH) Interventions    Readmission Risk Interventions No flowsheet data found.

## 2021-10-25 NOTE — Progress Notes (Signed)
*  PRELIMINARY RESULTS* Echocardiogram 2D Echocardiogram has been performed.  Andrea Phillips 10/25/2021, 10:37 AM

## 2021-10-25 NOTE — Plan of Care (Signed)
°  Problem: Education: Goal: Knowledge of General Education information will improve Description: Including pain rating scale, medication(s)/side effects and non-pharmacologic comfort measures Outcome: Progressing   Problem: Clinical Measurements: Goal: Will remain free from infection Outcome: Progressing Goal: Cardiovascular complication will be avoided Outcome: Progressing   Problem: Pain Managment: Goal: General experience of comfort will improve Outcome: Progressing   Problem: Safety: Goal: Ability to remain free from injury will improve Outcome: Progressing

## 2021-10-26 ENCOUNTER — Encounter: Payer: Self-pay | Admitting: Internal Medicine

## 2021-10-26 DIAGNOSIS — I214 Non-ST elevation (NSTEMI) myocardial infarction: Secondary | ICD-10-CM | POA: Diagnosis not present

## 2021-10-26 LAB — CBC
HCT: 37 % (ref 36.0–46.0)
Hemoglobin: 12.7 g/dL (ref 12.0–15.0)
MCH: 30.5 pg (ref 26.0–34.0)
MCHC: 34.3 g/dL (ref 30.0–36.0)
MCV: 88.9 fL (ref 80.0–100.0)
Platelets: 240 10*3/uL (ref 150–400)
RBC: 4.16 MIL/uL (ref 3.87–5.11)
RDW: 14.4 % (ref 11.5–15.5)
WBC: 7.4 10*3/uL (ref 4.0–10.5)
nRBC: 0 % (ref 0.0–0.2)

## 2021-10-26 LAB — HEPARIN LEVEL (UNFRACTIONATED): Heparin Unfractionated: 0.4 IU/mL (ref 0.30–0.70)

## 2021-10-26 LAB — BASIC METABOLIC PANEL
Anion gap: 3 — ABNORMAL LOW (ref 5–15)
BUN: 15 mg/dL (ref 8–23)
CO2: 27 mmol/L (ref 22–32)
Calcium: 9.1 mg/dL (ref 8.9–10.3)
Chloride: 106 mmol/L (ref 98–111)
Creatinine, Ser: 0.74 mg/dL (ref 0.44–1.00)
GFR, Estimated: >60 mL/min (ref >60)
Glucose, Bld: 105 mg/dL — ABNORMAL HIGH (ref 70–99)
Potassium: 3.8 mmol/L (ref 3.5–5.1)
Sodium: 136 mmol/L (ref 135–145)

## 2021-10-26 LAB — GLUCOSE, CAPILLARY: Glucose-Capillary: 96 mg/dL (ref 70–99)

## 2021-10-26 LAB — MAGNESIUM: Magnesium: 1.9 mg/dL (ref 1.7–2.4)

## 2021-10-26 NOTE — Progress Notes (Signed)
OT Cancellation Note  Patient Details Name: Andrea Phillips MRN: [id number] DOB: 11-11-1934   Cancelled Treatment:    Reason Eval/Treat Not Completed: OT screened, no needs identified, will sign off. Pt is currently independent in all aspects of care and reports being at baseline level.   Jackquline Denmark, MS, OTR/L , CBIS ascom [phone]  10/26/21, 2:57 PM

## 2021-10-26 NOTE — Progress Notes (Signed)
ANTICOAGULATION CONSULT NOTE  Pharmacy Consult for Heparin Drip Indication: chest pain/ACS  Patient Measurements: Height: 5' (152.4 cm) Weight: 69.9 kg (154 lb 3.2 oz) IBW/kg (Calculated) : 45.5 Heparin Dosing Weight: 60.8 kg  Labs: Recent Labs    10/24/21 1226 10/24/21 1727 10/24/21 1929 10/25/21 1128 10/25/21 2050 10/26/21 0419  HGB 14.0  --   --   --   --  12.7  HCT 42.6  --   --   --   --  37.0  PLT 288  --   --   --   --  240  APTT  --   --  >200*  --   --   --   LABPROT 13.0  --   --   --   --   --   INR 1.0  --   --   --   --   --   HEPARINUNFRC  --   --   --  0.16* 0.34 0.40  CREATININE 0.94  --   --   --   --  0.74  TROPONINIHS 87* >24,000*  --   --   --   --      Estimated Creatinine Clearance: 45.7 mL/min (by C-G formula based on SCr of 0.74 mg/dL).   Medical History: Past Medical History:  Diagnosis Date   Arthritis    ra   Asthma    CHF (congestive heart failure) (HCC)    Dysrhythmia    Edema extremities    GERD (gastroesophageal reflux disease)    Gout    Headache    History of hiatal hernia    Hypertension    Hypothyroidism    Shortness of breath dyspnea    Sleep apnea     Assessment: Patient is a 87yo female admitted with chest pain. Pharmacy consulted for heparin dosing. No prior to admission anticoagulants noted.  Patient is status post cardiac catheterization on 10/24/21 with findings of severe single-vessel CAD with occlusion of distal OM3 branch non amenable to intervention.  Goal of Therapy:  Heparin level 0.3-0.7 units/ml Monitor platelets by anticoagulation protocol: Yes  Labs Date/time results  Comment 1/2@2050  HL 0.34 Therapeutic x1 @ 950 units/hr 1/3@0419  HL 0.40 Therapeutic x2 @ 950 u/hr   Plan:  Heparin level remains therapeutic at current rate.Hgb and plt stable as well. Will continue heparin infusion at 950 units/hr Check HL tomorrow with AM labs Daily CBC per protocol while on IV heparin  Zahmir Lalla Rodriguez-Guzman  PharmD, BCPS 10/26/2021 9:56 AM

## 2021-10-26 NOTE — Evaluation (Signed)
Physical Therapy Evaluation Patient Details Name: Andrea Phillips MRN: [id number] DOB: 1934-11-12 Today's Date: 10/26/2021  History of Present Illness  Pt is an 87 yo female that presented to ED for chest pain admitted for NSTEMI, underwent cardiac catheterization 10/24/21 this admission. PMH of CHF, asthma, HTN, GERD, HTN.   Clinical Impression  Pt A&Ox4, no complaints of pain. Reported at baseline she is independent (furniture walks in her home, uses a walking stick for community ambulation). Did report 1 fall in the last 6 months.  The patient demonstrated bed mobility, transfers, and ambulation independently/modI. She ambulated ~140ft with RW modI. HR 80s-90s with activity. Pt did report some baseline dizziness that she has had in the past. PT and pt also discussed blood pressure, due to pt concerns of continued BP elevation with functional activities. The patient demonstrated and reported return to baseline level of functioning, no further acute PT needs indicated. PT to sign off. Please reconsult PT if pt status changes or acute needs are identified.        Recommendations for follow up therapy are one component of a multi-disciplinary discharge planning process, led by the attending physician.  Recommendations may be updated based on patient status, additional functional criteria and insurance authorization.  Follow Up Recommendations No PT follow up    Assistance Recommended at Discharge PRN  Patient can return home with the following       Equipment Recommendations None recommended by PT  Recommendations for Other Services       Functional Status Assessment Patient has not had a recent decline in their functional status     Precautions / Restrictions Precautions Precaution Comments: post cath "broken wing" on RUE Restrictions Weight Bearing Restrictions: Yes RUE Weight Bearing: Non weight bearing      Mobility  Bed Mobility Overal bed mobility: Independent                   Transfers Overall transfer level: Independent                 General transfer comment: definite use of hands but no AD or physical assist    Ambulation/Gait   Gait Distance (Feet): 100 Feet Assistive device: Rolling walker (2 wheels)         General Gait Details: able to adhere to "broken wing" precautions of RUE by loosely grasping RW. HR 80s-90s.  Stairs            Wheelchair Mobility    Modified Rankin (Stroke Patients Only)       Balance Overall balance assessment: Mild deficits observed, not formally tested                                           Pertinent Vitals/Pain Pain Assessment: No/denies pain    Home Living Family/patient expects to be discharged to:: Private residence Living Arrangements: Other relatives Available Help at Discharge: Family;Available 24 hours/day Type of Home: House           Home Equipment: Agricultural consultant (2 wheels);Cane - single point;Other (comment) (walking stick) Additional Comments: 1 fall in the last 6 months    Prior Function Prior Level of Function : Independent/Modified Independent             Mobility Comments: walking stick for community ambulation, pt endorses furniture walking in home       Hand  Dominance   Dominant Hand: Right    Extremity/Trunk Assessment   Upper Extremity Assessment Upper Extremity Assessment: Overall WFL for tasks assessed;Defer to OT evaluation    Lower Extremity Assessment Lower Extremity Assessment: Overall WFL for tasks assessed    Cervical / Trunk Assessment Cervical / Trunk Assessment: Normal  Communication   Communication: No difficulties  Cognition Arousal/Alertness: Awake/alert Behavior During Therapy: WFL for tasks assessed/performed Overall Cognitive Status: Within Functional Limits for tasks assessed                                          General Comments      Exercises      Assessment/Plan    PT Assessment Patient does not need any further PT services  PT Problem List         PT Treatment Interventions      PT Goals (Current goals can be found in the Care Plan section)       Frequency       Co-evaluation               AM-PAC PT "6 Clicks" Mobility  Outcome Measure Help needed turning from your back to your side while in a flat bed without using bedrails?: None Help needed moving from lying on your back to sitting on the side of a flat bed without using bedrails?: None Help needed moving to and from a bed to a chair (including a wheelchair)?: None Help needed standing up from a chair using your arms (e.g., wheelchair or bedside chair)?: None Help needed to walk in hospital room?: None Help needed climbing 3-5 steps with a railing? : A Little 6 Click Score: 23    End of Session   Activity Tolerance: Patient tolerated treatment well Patient left: in bed;with call bell/phone within reach;with family/visitor present Nurse Communication: Mobility status PT Visit Diagnosis: Difficulty in walking, not elsewhere classified (R26.2)    Time: 7124-5809 PT Time Calculation (min) (ACUTE ONLY): 14 min   Charges:   PT Evaluation $PT Eval Low Complexity: 1 Low          Olga Coaster PT, DPT 2:44 PM,10/26/21

## 2021-10-26 NOTE — Progress Notes (Addendum)
[facility] Cardiology  CARDIOLOGY CONSULT NOTE  Patient ID: Andrea Phillips MRN: [id number] DOB/AGE: June 25, 1935 87 y.o.  Admit date: 10/24/2021 Referring Physician Dr. Tobie Poet Primary Physician Perrin Maltese, MD Primary Cardiologist Nehemiah Massed Reason for Consultation NSTEMI  HPI:  Andrea Phillips is an 87 year old female with a history of HFrEF (25-30%), frequent PVCs, hypertension, hyperlipidemia, OSA who presented to the hospital on 10/24/2021 for evaluation of chest pain.  Her troponin was discovered to be mildly elevated and then increasing to greater than 24,000.  EKG showed some ST depressions laterally.  She underwent urgent left heart catheterization overnight for ongoing chest pain which revealed an occluded OM 3 that was too small for intervention.  Interval history: -patient is sitting up eating an omelette in the ICU, states she is feeling much better than when she came in. -States she had some chest pressure yesterday but denies any this morning -Eager to get home.   Review of systems complete and to be negative unless listed above     Past Medical History:  Diagnosis Date   Arthritis    ra   Asthma    CHF (congestive heart failure) (HCC)    Dysrhythmia    Edema extremities    GERD (gastroesophageal reflux disease)    Gout    Headache    History of hiatal hernia    Hypertension    Hypothyroidism    Shortness of breath dyspnea    Sleep apnea     Past Surgical History:  Procedure Laterality Date   APPENDECTOMY     BACK SURGERY     CARDIAC CATHETERIZATION     CATARACT EXTRACTION W/PHACO Left 03/09/2015   Procedure: CATARACT EXTRACTION PHACO AND INTRAOCULAR LENS PLACEMENT ([place]);  Surgeon: Estill Cotta, MD;  Location: [facility] OR[facility];  Service: Ophthalmology;  Laterality: Left;  [place] 01:31 AP% 26.1 CDE 43.72   CATARACT EXTRACTION W/PHACO Right 08/19/2021   Procedure: CATARACT EXTRACTION PHACO AND INTRAOCULAR LENS PLACEMENT ([place]) RIGHT;  Surgeon: Birder Robson, MD;   Location: [facility] OR[facility];  Service: Ophthalmology;  Laterality: Right;  12.31 1:07.0   CATARACT EXTRACTION W/PHACO Right 09/07/2021   Procedure: REMOVAL OF LENS FRAGMENTS RIGHT;  Surgeon: Birder Robson, MD;  Location: [place];  Service: Ophthalmology;  Laterality: Right;   CHOLECYSTECTOMY     CORONARY/GRAFT ACUTE MI REVASCULARIZATION N/A 10/24/2021   Procedure: Coronary/Graft Acute MI Revascularization;  Surgeon: Nelva Bush, MD;  Location: [place] [facility];  Service: Cardiovascular;  Laterality: N/A;   EYE SURGERY     retina   JOINT REPLACEMENT     left knee   LEFT HEART CATH AND CORONARY ANGIOGRAPHY N/A 04/02/2021   Procedure: LEFT HEART CATH AND CORONARY ANGIOGRAPHY;  Surgeon: Corey Skains, MD;  Location: [place] [facility];  Service: Cardiovascular;  Laterality: N/A;   LEFT HEART CATH AND CORONARY ANGIOGRAPHY N/A 10/24/2021   Procedure: LEFT HEART CATH AND CORONARY ANGIOGRAPHY;  Surgeon: Nelva Bush, MD;  Location: [place] [facility];  Service: Cardiovascular;  Laterality: N/A;    Medications Prior to Admission  Medication Sig Dispense Refill Last Dose   carvedilol (COREG) 12.5 MG tablet Take 12.5 mg by mouth in the morning and at bedtime.      cetirizine (ZYRTEC) 10 MG tablet Take 10 mg by mouth daily.   10/23/2021 at NIGHT   fluticasone (FLONASE) 50 MCG/ACT nasal spray Place 1 spray into both nostrils 2 (two) times daily as needed for allergies or rhinitis.   10/23/2021 at NIGHT  furosemide (LASIX) 40 MG tablet Take 0.5 tablets (20 mg total) by mouth daily. 45 tablet 3 10/23/2021 at NIGHT   levothyroxine (SYNTHROID) 112 MCG tablet Take 112 mcg by mouth daily.   10/23/2021 at NIGHT   metoprolol succinate (TOPROL-XL) 50 MG 24 hr tablet Take 1 tablet (50 mg total) by mouth daily. Take with or immediately following a meal. (Patient taking differently: Take 25 mg by mouth daily. Take with or immediately following a meal.) 90 tablet 1 10/23/2021    mometasone-formoterol (DULERA) 200-5 MCG/ACT AERO Inhale 2 puffs into the lungs 2 (two) times daily. 1 each 1 Past Week   omeprazole (PRILOSEC OTC) 20 MG tablet Take 2 tablets (40 mg total) by mouth daily. (Patient taking differently: Take 40 mg by mouth daily as needed (reflux).) 5 tablet 0 Past Week at PRN   telmisartan (MICARDIS) 40 MG tablet Take 80 mg by mouth daily.   10/23/2021 at NIGHT   albuterol (VENTOLIN HFA) 108 (90 Base) MCG/ACT inhaler Inhale 2 puffs into the lungs every 6 (six) hours as needed for wheezing or shortness of breath.   10/22/2021   brimonidine-timolol (COMBIGAN) 0.2-0.5 % ophthalmic solution Place 1 drop into the right eye 2 (two) times daily. (Patient not taking: Reported on 10/24/2021) 3 mL 0 Not Taking   fluocinonide (LIDEX) 0.05 % external solution Apply 1 application topically once a week. (Patient not taking: Reported on 10/24/2021)   Not Taking   icosapent Ethyl (VASCEPA) 1 g capsule Take 2 g by mouth 2 (two) times daily with a meal.      meclizine (ANTIVERT) 25 MG tablet Take 25 mg by mouth 2 (two) times daily as needed for dizziness.   PRN   Netarsudil Dimesylate (RHOPRESSA) 0.02 % SOLN Place 1 drop into the right eye at bedtime. (Patient not taking: Reported on 10/24/2021) 3 mL 0 Not Taking   Prednisolon-Moxiflox-Bromfenac 1-0.5-0.075 % SOLN Place 1 drop into the right eye 4 (four) times daily. (Patient not taking: Reported on 10/24/2021) 3 mL 0 Not Taking   Social History   Socioeconomic History   Marital status: Widowed    Spouse name: Not on file   Number of children: Not on file   Years of education: Not on file   Highest education level: Not on file  Occupational History   Not on file  Tobacco Use   Smoking status: Never   Smokeless tobacco: Never  Substance and Sexual Activity   Alcohol use: No   Drug use: No   Sexual activity: Not on file  Other Topics Concern   Not on file  Social History Narrative   Not on file   Social Determinants of Health    Financial Resource Strain: Not on file  Food Insecurity: Not on file  Transportation Needs: Not on file  Physical Activity: Not on file  Stress: Not on file  Social Connections: Not on file  Intimate Partner Violence: Not on file    Family History  Problem Relation Age of Onset   Heart disease Mother    Heart disease Father    Breast cancer Cousin       Review of systems complete and found to be negative unless listed above      PHYSICAL EXAM  General: Pleasant elderly Caucasian female well developed, well nourished, in no acute distress HEENT:  Normocephalic and atramatic Neck:  No JVD.  Lungs: Clear bilaterally to auscultation Heart: HRRR . Normal S1 and S2 without gallops or murmurs.  Abdomen: Nondistended appearing Msk:  Back normal, normal gait. Normal strength and tone for age. Extremities: No clubbing, cyanosis or edema.   Neuro: Alert and oriented X 3. Psych:  Good affect, responds appropriately  Labs:   Lab Results  Component Value Date   WBC 7.4 10/26/2021   HGB 12.7 10/26/2021   HCT 37.0 10/26/2021   MCV 88.9 10/26/2021   PLT 240 10/26/2021    Recent Labs  Lab 10/26/21 0419  NA 136  K 3.8  CL 106  CO2 27  BUN 15  CREATININE 0.74  CALCIUM 9.1  GLUCOSE 105*    Lab Results  Component Value Date   CKTOTAL 113 04/02/2021     Lab Results  Component Value Date   CHOL 185 10/25/2021   CHOL 170 04/02/2021   Lab Results  Component Value Date   HDL 49 10/25/2021   HDL 46 04/02/2021   Lab Results  Component Value Date   LDLCALC 116 (H) 10/25/2021   LDLCALC 112 (H) 04/02/2021   Lab Results  Component Value Date   TRIG 99 10/25/2021   TRIG 60 04/02/2021   Lab Results  Component Value Date   CHOLHDL 3.8 10/25/2021   CHOLHDL 3.7 04/02/2021   No results found for: LDLDIRECT    Radiology: DG Chest 2 View  Result Date: 10/24/2021 CLINICAL DATA:  87 year old female with history of shortness of breath and chest pain. EXAM: CHEST - 2  VIEW COMPARISON:  Chest x-ray 08/29/2021. FINDINGS: Lung volumes are normal. Persistent areas of peribronchial cuffing, interstitial prominence an ill-defined airspace disease throughout the mid to lower lungs bilaterally. No pleural effusions. No pneumothorax. No pulmonary nodule or mass noted. Pulmonary vasculature and the cardiomediastinal silhouette are within normal limits. Atherosclerosis in the thoracic aorta. IMPRESSION: 1. Persistent bilateral lower lobe bronchopneumonia, with slightly improved aeration in the right lower lobe compared to the prior examination. Electronically Signed   By: Vinnie Langton M.D.   On: 10/24/2021 13:21   CT CHEST W CONTRAST  Result Date: 10/11/2021 CLINICAL DATA:  Follow-up pneumonia, productive cough and congestion since November 2022 EXAM: CT CHEST WITH CONTRAST TECHNIQUE: Multidetector CT imaging of the chest was performed during intravenous contrast administration. CONTRAST:  9mL OMNIPAQUE IOHEXOL 300 MG/ML  SOLN IV COMPARISON:  04/07/2020 CT chest, chest radiographs 08/29/2021 FINDINGS: Cardiovascular: Atherosclerotic calcifications aorta, proximal great vessels, and coronary arteries. Aorta normal caliber without aneurysm. Vascular structures grossly patent on non targeted exam. Dilatation of cardiac chambers. No pericardial effusion. Mediastinum/Nodes: Small hiatal hernia. Esophagus stable, unremarkable. Base of cervical region normal appearance. No thoracic adenopathy. Lungs/Pleura: Subsegmental atelectasis in BILATERAL lower lobes. Patchy infiltrates RIGHT lower lobe, improved since prior chest radiograph. Remaining lungs clear. No pleural effusion or pneumothorax. Upper Abdomen: Visualized upper abdomen unremarkable Musculoskeletal: No acute osseous findings. IMPRESSION: Patchy infiltrates RIGHT lower lobe, improved since prior chest radiograph. Bibasilar subsegmental atelectasis. Small hiatal hernia. Scattered atherosclerotic calcifications including coronary  arteries. Aortic Atherosclerosis (ICD10-I70.0). Electronically Signed   By: Lavonia Dana M.D.   On: 10/11/2021 16:30   CARDIAC CATHETERIZATION  Result Date: 10/24/2021 Conclusions: Severe single-vessel coronary artery disease with occlusion of distal OM3 branch, which is too small/distal for intervention.  There is also mild plaquing in the mid LAD with up to 20% stenosis. Moderately elevated left ventricular filling pressure (LVEDP 25 mmHg). Recommendation: Overnight observation in stepdown unit.  If patient has recurrent chest pain, recommend initiation of IV nitroglycerin and obtaining CTA chest to evaluate for dissection (very high BP  on presentation) and pulmonary embolism (recent hospitalization for pneumonia). Continue clopidogrel 75 mg daily, given history of aspirin intolerance. Restart IV heparin in 2 hours; recommend completing 48 hours of heparin (if no contraindication develops) for medical management of NSTEMI. Follow-up echocardiogram. Aggressive secondary prevention of coronary artery disease.  Consider rechallenging with statin versus outpatient trial of PCSK-9 inhibitor. Consider gentle diuresis. Nelva Bush, MD [facility]  CT ANGIO CHEST AORTA W/CM &/OR WO/CM  Result Date: 10/25/2021 CLINICAL DATA:  Evaluate for acute aortic syndrome. Chest pain radiating into right arm. EXAM: CT ANGIOGRAPHY CHEST WITH CONTRAST TECHNIQUE: Multidetector CT imaging of the chest was performed using the standard protocol during bolus administration of intravenous contrast. Multiplanar CT image reconstructions and MIPs were obtained to evaluate the vascular anatomy. CONTRAST:  94mL OMNIPAQUE IOHEXOL 350 MG/ML SOLN COMPARISON:  10/11/2021 FINDINGS: Cardiovascular: No aortic intramural hemotoma. Preferential opacification of the thoracic aorta. No evidence of thoracic aortic aneurysm or dissection. Normal heart size. No pericardial effusion. Aortic atherosclerosis and mild coronary artery calcifications. The  main pulmonary artery appears patent. No signs of central obstructing pulmonary embolus. Mediastinum/Nodes: Thyroid gland appears atrophic or surgically absent. Trachea is patent and midline. Normal appearance of the esophagus. No enlarged axillary, supraclavicular, mediastinal, or hilar lymph nodes. Lungs/Pleura: No pleural effusion. Stable bandlike area of scarring within the posteromedial left lower lobe. Similar appearance of right lower lobe peripheral and basilar predominant areas of subsegmental atelectasis and subpleural consolidation. Findings compatible with postinflammatory or infectious process. Upper Abdomen: No acute abnormality. Aortic atherosclerosis. Hiatal hernia. Status post cholecystectomy. Unchanged appearance of left portal hepatic venous shunt within segment 3 of the liver, image 92/4. Musculoskeletal: Spondylosis identified within the thoracic spine. No acute or suspicious osseous findings. Review of the MIP images confirms the above findings. IMPRESSION: 1. No evidence for acute aortic syndrome. 2. Similar appearance of peripheral and basilar predominant areas of subsegmental atelectasis and subpleural consolidation with thickening of the peribronchovascular interstitium compatible with postinflammatory or infectious process. Suggest continued interval follow-up with repeat CT of the chest in 3 months to ensure complete resolution. 3. Aortic Atherosclerosis (ICD10-I70.0). Coronary artery calcifications. Electronically Signed   By: Kerby Moors M.D.   On: 10/25/2021 08:26   ECHOCARDIOGRAM COMPLETE  Result Date: 10/25/2021    ECHOCARDIOGRAM REPORT   Patient Name:   Andrea Phillips Date of Exam: 10/25/2021 Medical Rec #:  [id number]           Height:       60.0 in Accession #:    [id number]          Weight:       154.2 lb Date of Birth:  [birth year]           BSA:          1.671 m Patient Age:    61 years            BP:           130/57 mmHg Patient Gender: F                   HR:            68 bpm. Exam Location:  [facility] Procedure: 2D Echo, Color Doppler, Cardiac Doppler and Intracardiac            Opacification Agent Indications:     R07.9 Chest Pain; Elevated troponin  History:         Patient has prior history of Echocardiogram examinations, most  recent 04/02/2021. CHF; Risk Factors:Hypertension and Sleep                  Apnea.  Sonographer:     Charmayne Sheer Referring Phys:  [id number] AMY N [facility] Diagnosing Phys: Donnelly Angelica  Sonographer Comments: Suboptimal apical window and no subcostal window. IMPRESSIONS  1. Left ventricular ejection fraction, by estimation, is 50 to 55%. The left ventricle has low normal function. The left ventricle has no regional wall motion abnormalities. Left ventricular diastolic parameters are consistent with Grade II diastolic dysfunction (pseudonormalization).  2. Right ventricular systolic function is normal. The right ventricular size is normal.  3. Left atrial size was mildly dilated.  4. The mitral valve is normal in structure. No evidence of mitral valve regurgitation. No evidence of mitral stenosis.  5. The aortic valve is grossly normal. Aortic valve regurgitation is not visualized. No aortic stenosis is present. FINDINGS  Left Ventricle: Left ventricular ejection fraction, by estimation, is 50 to 55%. The left ventricle has low normal function. The left ventricle has no regional wall motion abnormalities. Definity contrast agent was given IV to delineate the left ventricular endocardial borders. The left ventricular internal cavity size was normal in size. There is no left ventricular hypertrophy. Left ventricular diastolic parameters are consistent with Grade II diastolic dysfunction (pseudonormalization). Right Ventricle: The right ventricular size is normal. Right vetricular wall thickness was not well visualized. Right ventricular systolic function is normal. Left Atrium: Left atrial size was mildly dilated. Right Atrium: Right atrial size was not  well visualized. Pericardium: There is no evidence of pericardial effusion. Mitral Valve: The mitral valve is normal in structure. No evidence of mitral valve regurgitation. No evidence of mitral valve stenosis. MV peak gradient, 3.3 mmHg. The mean mitral valve gradient is 2.0 mmHg. Tricuspid Valve: The tricuspid valve is not well visualized. Tricuspid valve regurgitation is not demonstrated. Aortic Valve: The aortic valve is grossly normal. Aortic valve regurgitation is not visualized. No aortic stenosis is present. Aortic valve mean gradient measures 6.0 mmHg. Aortic valve peak gradient measures 10.1 mmHg. Aortic valve area, by VTI measures  1.53 cm. Pulmonic Valve: The pulmonic valve was not well visualized. Pulmonic valve regurgitation is not visualized. Aorta: The aortic root is normal in size and structure. Venous: The inferior vena cava was not well visualized. IAS/Shunts: The interatrial septum was not well visualized.  LEFT VENTRICLE PLAX 2D LVIDd:         4.66 cm      Diastology LVIDs:         3.66 cm      LV e' medial:    5.44 cm/s LV PW:         1.16 cm      LV E/e' medial:  16.7 LV IVS:        0.75 cm      LV e' lateral:   5.87 cm/s LVOT diam:     2.00 cm      LV E/e' lateral: 15.5 LV SV:         46 LV SV Index:   27 LVOT Area:     3.14 cm  LV Volumes (MOD) LV vol d, MOD A2C: 80.3 ml LV vol d, MOD A4C: 113.0 ml LV vol s, MOD A2C: 48.5 ml LV vol s, MOD A4C: 56.6 ml LV SV MOD A2C:     31.8 ml LV SV MOD A4C:     113.0 ml LV SV MOD BP:  45.1 ml LEFT ATRIUM             Index LA diam:        3.80 cm 2.27 cm/m LA Vol (A2C):   51.6 ml 30.87 ml/m LA Vol (A4C):   81.3 ml 48.65 ml/m LA Biplane Vol: 65.9 ml 39.43 ml/m  AORTIC VALVE                     PULMONIC VALVE AV Area (Vmax):    1.44 cm      PV Vmax:       0.86 m/s AV Area (Vmean):   1.21 cm      PV Vmean:      56.700 cm/s AV Area (VTI):     1.53 cm      PV VTI:        0.159 m AV Vmax:           159.00 cm/s   PV Peak grad:  3.0 mmHg AV Vmean:           119.000 cm/s  PV Mean grad:  1.0 mmHg AV VTI:            0.300 m AV Peak Grad:      10.1 mmHg AV Mean Grad:      6.0 mmHg LVOT Vmax:         73.10 cm/s LVOT Vmean:        45.900 cm/s LVOT VTI:          0.146 m LVOT/AV VTI ratio: 0.49  AORTA Ao Root diam: 2.50 cm MITRAL VALVE               TRICUSPID VALVE MV Area (PHT): 2.94 cm    TR Peak grad:   25.4 mmHg MV Area VTI:   1.44 cm    TR Vmax:        252.00 cm/s MV Peak grad:  3.3 mmHg MV Mean grad:  2.0 mmHg    SHUNTS MV Vmax:       0.91 m/s    Systemic VTI:  0.15 m MV Vmean:      60.2 cm/s   Systemic Diam: 2.00 cm MV Decel Time: 258 msec MV E velocity: 90.80 cm/s MV A velocity: 80.60 cm/s MV E/A ratio:  1.13 Donnelly Angelica Electronically signed by Donnelly Angelica Signature Date/Time: 10/25/2021/1:09:32 PM    Final     EKG: Sinus rhythm. Lateral ST depressions of ~ 1 mm.   ASSESSMENT AND PLAN:  Andrea Phillips is an 87 year old female with a history of HFrEF (25-30%), frequent PVCs, hypertension, hyperlipidemia, OSA who presented to the hospital on 10/24/2021 for evaluation of chest pain and was discovered to have an NSTEMI with a troponin greater than 24,000.  Underwent left heart cath 10/24/2021 which revealed an occluded OM 3 that was too small for intervention.  #NSTEMI, chest pain The patient presents with several hours of ongoing severe chest pain, and subsequently discovered to have a troponin that rose from near normal to greater than 24,000 over 6-hour period.  EKG showed lateral ST depressions but otherwise was nondiagnostic.  Heart catheterization showed no clear culprit for this aside from a possibly occluded small OM 3 vessel. -We will hold on aspirin given previous allergy -s/p Plavix 300 mg.  Continue 75 mg daily indefinitely -Continue heparin infusion x48 hours or until discharge (to be finished 1/4 at 0300)  -Continue metoprolol XL 25 mg; takes 50 mg at home. HR has been  high 50s, low 60s but unsure how much pt has been up and moving. Will aim to  increase back to 50mg  prior to DC.  -Patient has listed allergy to atorvastatin; she states she doesn't like that medicine and wont take it. Says she had hives to crestor.   - continue pravastatin 20 mg daily -Continue Vascepa 2 g twice daily at discharge -Echocardiogram revealed improved ejection fraction to 50-55% compared to 25-35% 03/2021  -No further cardiac diagnostics at this time. If ambulating well today, she should be ok for discharge from a cardiac standpoint tomorrow morning. Will recommend close outpatient follow-up with Dr. Nehemiah Massed at discharge.  #Chronic HF with improved EF (EF 50-55% up from 25-30%) -Metoprolol XL as above -On telmisartan 80 mg daily at home; which is not on formulary at the hospital. Irbesartan 300mg  once daily added last night as BP has increased.   Signed: Alanson Puls Latangela Mccomas PA-C 10/26/2021, 9:28 AM

## 2021-10-26 NOTE — Progress Notes (Signed)
PROGRESS NOTE    Andrea Phillips  [id number] DOB: December 02, 1934 DOA: 10/24/2021 PCP: Perrin Maltese, MD    Brief Narrative:  87 y.o. female with medical history significant for heart failure with reduced ejection fraction NYHA class III, frequent PVCs, GERD, hypothyroid, hypertension, obstructive sleep apnea on CPAP, history of recent left lower lobe pneumonia, who presents to the emergency department for chief concerns of chest pain that goes to her back and radiates to her right arm.   She woke up at 11 am and had chest pain. She describes the chest pain as pressured, 9/10, persistent, and does not go away. She reports currently the pain is a 7/10. She denies any new abnormal stressors in her life.  She reports she is never felt this way before.  She denies any other symptoms including radiation to her extremities.   She endorses shortness of breath. She endorses some nausea and denies vomiting. She denies fever, chills, new cough, abdominal pain, diarrhea, dysuria, hematuria.    She states her last bowel movement was 10/23/21 and it was normal.    She did have a small amount of wine the previous night for New Years. She states it was about 2-3 oz.   Given her symptomatology interventional cardiology was contacted.  Patient underwent urgent cardiac catheterization on 1/1.  No clear etiology or culprit lesion was identified.  Small occluded OM1 branch.  Not amenable to PCI.  Recommend heparin GTT and medical management.  Seen in follow-up by cardiology.   Assessment & Plan:   Principal Problem:   NSTEMI (non-ST elevated myocardial infarction) ([facility]) Active Problems:   Essential hypertension   Frequent PVCs   OSA (obstructive sleep apnea)   Elevated troponin   Acute on chronic systolic CHF (congestive heart failure) (H[facility])   Hypothyroidism   Hyperlipidemia, mixed   H/O medication noncompliance  NSTEMI Chest pain Initial presentation included several hours of ongoing chest  pain Significant delta and high-sensitivity troponin EKG with lateral ST depressions Urgent left heart cath no clear culprit Possibly occluded OM 3 vessel Plan: Defer aspirin given allergy Status post Plavix load, continue 75 mg daily Continue heparin GTT x48 hours Metoprolol XL 25 mg daily with goal to increase to 50 before discharge Pravastatin 20 mg daily Vascepa 2 g twice daily at time of discharge Cardiology follow-up Case discussed with cardiology.  Patient is ambulating and chest pain-free can likely discharge home on 1/4  Chronic systolic congestive heart failure Toprol-XL as above ARB currently on hold Cardiology following  Obstructive sleep apnea Nightly CPAP  Hypothyroidism Synthroid 112 mcg daily     DVT prophylaxis: Heparin GTT Code Status: Full Family Communication: Sister [name] [phone] on 1/2 Disposition Plan: Status is: Inpatient  Remains inpatient appropriate because: NSTEMI on heparin gtt. improving.  Chest pain-free.  Anticipate discharge 1/4       Level of care: Telemetry Cardiac  Consultants:  Cardiology  Procedures:  Left heart catheterization 1/1  Antimicrobials: None   Subjective: Seen and examined.  Resting comfortably in bed.  No visible distress.  Chest pain-free  Objective: Vitals:   10/26/21 0500 10/26/21 0600 10/26/21 0946 10/26/21 1130  BP:  (!) 149/63 (!) 159/69 (!) 156/71  Pulse: (!) 58 (!) 58 64 (!) 59  Resp: 15 16 18 18   Temp:   98.3 F (36.8 C) 98.4 F (36.9 C)  TempSrc:      SpO2: 98% 100% 100% 97%  Weight:   74 kg   Height:  Intake/Output Summary (Last 24 hours) at 10/26/2021 1358 Last data filed at 10/26/2021 0600 Gross per 24 hour  Intake 154.53 ml  Output 1230 ml  Net -1075.47 ml   Filed Weights   10/24/21 1222 10/26/21 0946  Weight: 69.9 kg 74 kg    Examination:  General exam: No acute distress Respiratory system: Lungs clear.  Normal work of breathing.  Room air Cardiovascular  system: S1-S2, RRR, no murmurs, no pedal edema Gastrointestinal system: Soft, NT/ND, normal bowel sounds Central nervous system: Alert and oriented. No focal neurological deficits. Extremities: Symmetric 5 x 5 power. Skin: No rashes, lesions or ulcers Psychiatry: Judgement and insight appear normal. Mood & affect appropriate.     Data Reviewed: I have personally reviewed following labs and imaging studies  CBC: Recent Labs  Lab 10/24/21 1226 10/26/21 0419  WBC 6.3 7.4  HGB 14.0 12.7  HCT 42.6 37.0  MCV 90.8 88.9  PLT 288 [id number]   Basic Metabolic Panel: Recent Labs  Lab 10/24/21 1226 10/24/21 1929 10/26/21 0419  NA 136  --  136  K 3.9  --  3.8  CL 104  --  106  CO2 28  --  27  GLUCOSE 117*  --  105*  BUN 20  --  15  CREATININE 0.94  --  0.74  CALCIUM 9.9  --  9.1  MG  --  2.2 1.9   GFR: Estimated Creatinine Clearance: 47 mL/min (by C-G formula based on SCr of 0.74 mg/dL). Liver Function Tests: No results for input(s): AST, ALT, ALKPHOS, BILITOT, PROT, ALBUMIN in the last 168 hours. No results for input(s): LIPASE, AMYLASE in the last 168 hours. No results for input(s): AMMONIA in the last 168 hours. Coagulation Profile: Recent Labs  Lab 10/24/21 1226  INR 1.0   Cardiac Enzymes: No results for input(s): CKTOTAL, CKMB, CKMBINDEX, TROPONINI in the last 168 hours. BNP (last 3 results) No results for input(s): PROBNP in the last 8760 hours. HbA1C: Recent Labs    10/25/21 0415  HGBA1C 5.2   CBG: Recent Labs  Lab 10/24/21 2148  GLUCAP 96   Lipid Profile: Recent Labs    10/25/21 0415  CHOL 185  HDL 49  LDLCALC 116*  TRIG 99  CHOLHDL 3.8   Thyroid Function Tests: No results for input(s): TSH, T4TOTAL, FREET4, T3FREE, THYROIDAB in the last 72 hours. Anemia Panel: Recent Labs    10/24/21 1929  VITAMINB12 389   Sepsis Labs: Recent Labs  Lab 10/24/21 1929  PROCALCITON <0.10    Recent Results (from the past 240 hour(s))  Resp Panel by RT-PCR  (Flu A&B, Covid) Nasopharyngeal Swab     Status: None   Collection Time: 10/24/21  7:29 PM   Specimen: Nasopharyngeal Swab; Nasopharyngeal(NP) swabs in vial transport medium  Result Value Ref Range Status   SARS Coronavirus 2 by RT PCR NEGATIVE NEGATIVE Final    Comment: (NOTE) SARS-CoV-2 target nucleic acids are NOT DETECTED.  The SARS-CoV-2 RNA is generally detectable in upper respiratory specimens during the acute phase of infection. The lowest concentration of SARS-CoV-2 viral copies this assay can detect is 138 copies/mL. A negative result does not preclude SARS-Cov-2 infection and should not be used as the sole basis for treatment or other patient management decisions. A negative result may occur with  improper specimen collection/handling, submission of specimen other than nasopharyngeal swab, presence of viral mutation(s) within the areas targeted by this assay, and inadequate number of viral copies(<138 copies/mL). A negative result must  be combined with clinical observations, patient history, and epidemiological information. The expected result is Negative.  Fact Sheet for Patients:  EntrepreneurPulse.com.au  Fact Sheet for Healthcare Providers:  IncredibleEmployment.be  This test is no t yet approved or cleared by the [place] FDA and  has been authorized for detection and/or diagnosis of SARS-CoV-2 by FDA under an Emergency Use Authorization (EUA). This EUA will remain  in effect (meaning this test can be used) for the duration of the COVID-19 declaration under Section 564(b)(1) of the Act, 21 U.S.C.section 360bbb-3(b)(1), unless the authorization is terminated  or revoked sooner.       Influenza A by PCR NEGATIVE NEGATIVE Final   Influenza B by PCR NEGATIVE NEGATIVE Final    Comment: (NOTE) The Xpert Xpress SARS-CoV-2/FLU/RSV plus assay is intended as an aid in the diagnosis of influenza from Nasopharyngeal swab specimens  and should not be used as a sole basis for treatment. Nasal washings and aspirates are unacceptable for Xpert Xpress SARS-CoV-2/FLU/RSV testing.  Fact Sheet for Patients: EntrepreneurPulse.com.au  Fact Sheet for Healthcare Providers: IncredibleEmployment.be  This test is not yet approved or cleared by the [place] FDA and has been authorized for detection and/or diagnosis of SARS-CoV-2 by FDA under an Emergency Use Authorization (EUA). This EUA will remain in effect (meaning this test can be used) for the duration of the COVID-19 declaration under Section 564(b)(1) of the Act, 21 U.S.C. section 360bbb-3(b)(1), unless the authorization is terminated or revoked.  Performed at [facility], [address] [place], [place] [address]   MRSA Next Gen by PCR, Nasal     Status: None   Collection Time: 10/25/21 11:24 AM   Specimen: Nasal Mucosa; Nasal Swab  Result Value Ref Range Status   MRSA by PCR Next Gen NOT DETECTED NOT DETECTED Final    Comment: (NOTE) The GeneXpert MRSA Assay (FDA approved for NASAL specimens only), is one component of a comprehensive MRSA colonization surveillance program. It is not intended to diagnose MRSA infection nor to guide or monitor treatment for MRSA infections. Test performance is not FDA approved in patients less than 81 years old. Performed at [facility], [address], [place], [place] [address]          Radiology Studies: CARDIAC CATHETERIZATION  Result Date: 10/24/2021 Conclusions: Severe single-vessel coronary artery disease with occlusion of distal OM3 branch, which is too small/distal for intervention.  There is also mild plaquing in the mid LAD with up to 20% stenosis. Moderately elevated left ventricular filling pressure (LVEDP 25 mmHg). Recommendation: Overnight observation in stepdown unit.  If patient has recurrent chest pain, recommend initiation of IV nitroglycerin  and obtaining CTA chest to evaluate for dissection (very high BP on presentation) and pulmonary embolism (recent hospitalization for pneumonia). Continue clopidogrel 75 mg daily, given history of aspirin intolerance. Restart IV heparin in 2 hours; recommend completing 48 hours of heparin (if no contraindication develops) for medical management of NSTEMI. Follow-up echocardiogram. Aggressive secondary prevention of coronary artery disease.  Consider rechallenging with statin versus outpatient trial of PCSK-9 inhibitor. Consider gentle diuresis. Nelva Bush, MD [facility]  CT ANGIO CHEST AORTA W/CM &/OR WO/CM  Result Date: 10/25/2021 CLINICAL DATA:  Evaluate for acute aortic syndrome. Chest pain radiating into right arm. EXAM: CT ANGIOGRAPHY CHEST WITH CONTRAST TECHNIQUE: Multidetector CT imaging of the chest was performed using the standard protocol during bolus administration of intravenous contrast. Multiplanar CT image reconstructions and MIPs were obtained to evaluate the vascular anatomy.  CONTRAST:  64mL OMNIPAQUE IOHEXOL 350 MG/ML SOLN COMPARISON:  10/11/2021 FINDINGS: Cardiovascular: No aortic intramural hemotoma. Preferential opacification of the thoracic aorta. No evidence of thoracic aortic aneurysm or dissection. Normal heart size. No pericardial effusion. Aortic atherosclerosis and mild coronary artery calcifications. The main pulmonary artery appears patent. No signs of central obstructing pulmonary embolus. Mediastinum/Nodes: Thyroid gland appears atrophic or surgically absent. Trachea is patent and midline. Normal appearance of the esophagus. No enlarged axillary, supraclavicular, mediastinal, or hilar lymph nodes. Lungs/Pleura: No pleural effusion. Stable bandlike area of scarring within the posteromedial left lower lobe. Similar appearance of right lower lobe peripheral and basilar predominant areas of subsegmental atelectasis and subpleural consolidation. Findings compatible with  postinflammatory or infectious process. Upper Abdomen: No acute abnormality. Aortic atherosclerosis. Hiatal hernia. Status post cholecystectomy. Unchanged appearance of left portal hepatic venous shunt within segment 3 of the liver, image 92/4. Musculoskeletal: Spondylosis identified within the thoracic spine. No acute or suspicious osseous findings. Review of the MIP images confirms the above findings. IMPRESSION: 1. No evidence for acute aortic syndrome. 2. Similar appearance of peripheral and basilar predominant areas of subsegmental atelectasis and subpleural consolidation with thickening of the peribronchovascular interstitium compatible with postinflammatory or infectious process. Suggest continued interval follow-up with repeat CT of the chest in 3 months to ensure complete resolution. 3. Aortic Atherosclerosis (ICD10-I70.0). Coronary artery calcifications. Electronically Signed   By: Kerby Moors M.D.   On: 10/25/2021 08:26   ECHOCARDIOGRAM COMPLETE  Result Date: 10/25/2021    ECHOCARDIOGRAM REPORT   Patient Name:   Andrea Phillips Date of Exam: 10/25/2021 Medical Rec #:  [id number]           Height:       60.0 in Accession #:    [id number]          Weight:       154.2 lb Date of Birth:  [birth year]           BSA:          1.671 m Patient Age:    47 years            BP:           130/57 mmHg Patient Gender: F                   HR:           68 bpm. Exam Location:  [facility] Procedure: 2D Echo, Color Doppler, Cardiac Doppler and Intracardiac            Opacification Agent Indications:     R07.9 Chest Pain; Elevated troponin  History:         Patient has prior history of Echocardiogram examinations, most                  recent 04/02/2021. CHF; Risk Factors:Hypertension and Sleep                  Apnea.  Sonographer:     Charmayne Sheer Referring Phys:  [id number] AMY N COX Diagnosing Phys: Donnelly Angelica  Sonographer Comments: Suboptimal apical window and no subcostal window. IMPRESSIONS  1. Left ventricular ejection  fraction, by estimation, is 50 to 55%. The left ventricle has low normal function. The left ventricle has no regional wall motion abnormalities. Left ventricular diastolic parameters are consistent with Grade II diastolic dysfunction (pseudonormalization).  2. Right ventricular systolic function is normal. The right ventricular size is normal.  3. Left atrial size was  mildly dilated.  4. The mitral valve is normal in structure. No evidence of mitral valve regurgitation. No evidence of mitral stenosis.  5. The aortic valve is grossly normal. Aortic valve regurgitation is not visualized. No aortic stenosis is present. FINDINGS  Left Ventricle: Left ventricular ejection fraction, by estimation, is 50 to 55%. The left ventricle has low normal function. The left ventricle has no regional wall motion abnormalities. Definity contrast agent was given IV to delineate the left ventricular endocardial borders. The left ventricular internal cavity size was normal in size. There is no left ventricular hypertrophy. Left ventricular diastolic parameters are consistent with Grade II diastolic dysfunction (pseudonormalization). Right Ventricle: The right ventricular size is normal. Right vetricular wall thickness was not well visualized. Right ventricular systolic function is normal. Left Atrium: Left atrial size was mildly dilated. Right Atrium: Right atrial size was not well visualized. Pericardium: There is no evidence of pericardial effusion. Mitral Valve: The mitral valve is normal in structure. No evidence of mitral valve regurgitation. No evidence of mitral valve stenosis. MV peak gradient, 3.3 mmHg. The mean mitral valve gradient is 2.0 mmHg. Tricuspid Valve: The tricuspid valve is not well visualized. Tricuspid valve regurgitation is not demonstrated. Aortic Valve: The aortic valve is grossly normal. Aortic valve regurgitation is not visualized. No aortic stenosis is present. Aortic valve mean gradient measures 6.0 mmHg.  Aortic valve peak gradient measures 10.1 mmHg. Aortic valve area, by VTI measures  1.53 cm. Pulmonic Valve: The pulmonic valve was not well visualized. Pulmonic valve regurgitation is not visualized. Aorta: The aortic root is normal in size and structure. Venous: The inferior vena cava was not well visualized. IAS/Shunts: The interatrial septum was not well visualized.  LEFT VENTRICLE PLAX 2D LVIDd:         4.66 cm      Diastology LVIDs:         3.66 cm      LV e' medial:    5.44 cm/s LV PW:         1.16 cm      LV E/e' medial:  16.7 LV IVS:        0.75 cm      LV e' lateral:   5.87 cm/s LVOT diam:     2.00 cm      LV E/e' lateral: 15.5 LV SV:         46 LV SV Index:   27 LVOT Area:     3.14 cm  LV Volumes (MOD) LV vol d, MOD A2C: 80.3 ml LV vol d, MOD A4C: 113.0 ml LV vol s, MOD A2C: 48.5 ml LV vol s, MOD A4C: 56.6 ml LV SV MOD A2C:     31.8 ml LV SV MOD A4C:     113.0 ml LV SV MOD BP:      45.1 ml LEFT ATRIUM             Index LA diam:        3.80 cm 2.27 cm/m LA Vol (A2C):   51.6 ml 30.87 ml/m LA Vol (A4C):   81.3 ml 48.65 ml/m LA Biplane Vol: 65.9 ml 39.43 ml/m  AORTIC VALVE                     PULMONIC VALVE AV Area (Vmax):    1.44 cm      PV Vmax:       0.86 m/s AV Area (Vmean):   1.21 cm  PV Vmean:      56.700 cm/s AV Area (VTI):     1.53 cm      PV VTI:        0.159 m AV Vmax:           159.00 cm/s   PV Peak grad:  3.0 mmHg AV Vmean:          119.000 cm/s  PV Mean grad:  1.0 mmHg AV VTI:            0.300 m AV Peak Grad:      10.1 mmHg AV Mean Grad:      6.0 mmHg LVOT Vmax:         73.10 cm/s LVOT Vmean:        45.900 cm/s LVOT VTI:          0.146 m LVOT/AV VTI ratio: 0.49  AORTA Ao Root diam: 2.50 cm MITRAL VALVE               TRICUSPID VALVE MV Area (PHT): 2.94 cm    TR Peak grad:   25.4 mmHg MV Area VTI:   1.44 cm    TR Vmax:        252.00 cm/s MV Peak grad:  3.3 mmHg MV Mean grad:  2.0 mmHg    SHUNTS MV Vmax:       0.91 m/s    Systemic VTI:  0.15 m MV Vmean:      60.2 cm/s   Systemic Diam:  2.00 cm MV Decel Time: 258 msec MV E velocity: 90.80 cm/s MV A velocity: 80.60 cm/s MV E/A ratio:  1.13 Donnelly Angelica Electronically signed by Donnelly Angelica Signature Date/Time: 10/25/2021/1:09:32 PM    Final         Scheduled Meds:  Chlorhexidine Gluconate Cloth  6 each Topical Daily   clopidogrel  75 mg Oral Q breakfast   fentaNYL (SUBLIMAZE) injection  12.5 mcg Intravenous Once   irbesartan  300 mg Oral Daily   levothyroxine  112 mcg Oral Q0600   metoprolol succinate  25 mg Oral Daily   mometasone-formoterol  2 puff Inhalation BID   pravastatin  20 mg Oral q1800   sodium chloride flush  3 mL Intravenous Q12H   Continuous Infusions:  sodium chloride     heparin 950 Units/hr (10/26/21 0600)     LOS: 2 days    Time spent: 25 minutes    Sidney Ace, MD [facility]   If 7PM-7AM, please contact night-coverage  10/26/2021, 1:58 PM

## 2021-10-27 ENCOUNTER — Other Ambulatory Visit: Payer: Self-pay | Admitting: Student in an Organized Health Care Education/Training Program

## 2021-10-27 DIAGNOSIS — I259 Chronic ischemic heart disease, unspecified: Secondary | ICD-10-CM

## 2021-10-27 DIAGNOSIS — G4733 Obstructive sleep apnea (adult) (pediatric): Secondary | ICD-10-CM

## 2021-10-27 DIAGNOSIS — E038 Other specified hypothyroidism: Secondary | ICD-10-CM

## 2021-10-27 DIAGNOSIS — R079 Chest pain, unspecified: Secondary | ICD-10-CM

## 2021-10-27 DIAGNOSIS — E782 Mixed hyperlipidemia: Secondary | ICD-10-CM

## 2021-10-27 DIAGNOSIS — I10 Essential (primary) hypertension: Secondary | ICD-10-CM

## 2021-10-27 DIAGNOSIS — I5023 Acute on chronic systolic (congestive) heart failure: Secondary | ICD-10-CM

## 2021-10-27 DIAGNOSIS — I493 Ventricular premature depolarization: Secondary | ICD-10-CM

## 2021-10-27 LAB — CBC
HCT: 38.6 % (ref 36.0–46.0)
Hemoglobin: 13.1 g/dL (ref 12.0–15.0)
MCH: 30.5 pg (ref 26.0–34.0)
MCHC: 33.9 g/dL (ref 30.0–36.0)
MCV: 89.8 fL (ref 80.0–100.0)
Platelets: 250 10*3/uL (ref 150–400)
RBC: 4.3 MIL/uL (ref 3.87–5.11)
RDW: 14 % (ref 11.5–15.5)
WBC: 6.2 10*3/uL (ref 4.0–10.5)
nRBC: 0 % (ref 0.0–0.2)

## 2021-10-27 MED ORDER — PRAVASTATIN SODIUM 20 MG PO TABS
20.0000 mg | ORAL_TABLET | Freq: Every day | ORAL | 0 refills | Status: DC
Start: 2021-10-27 — End: 2022-02-11

## 2021-10-27 MED ORDER — CLOPIDOGREL BISULFATE 75 MG PO TABS: 75.0000 mg | ORAL_TABLET | Freq: Every day | ORAL | 0 refills | Status: AC

## 2021-10-27 MED ORDER — METOPROLOL SUCCINATE ER 25 MG PO TB24: 25.0000 mg | ORAL_TABLET | Freq: Every day | ORAL | 0 refills | Status: DC

## 2021-10-27 NOTE — Progress Notes (Signed)
Pt ambulated - around room / tolerated well /  Discharge instructions explained to pt/ iv and tele removed/ will transport off unit via wheelchair

## 2021-10-27 NOTE — Progress Notes (Signed)
[facility]  CARDIOLOGY CONSULT NOTE  Patient ID: Andrea Phillips MRN: [id number] DOB/AGE: July 23, 1935 87 y.o.  Admit date: 10/24/2021 Referring Physician Dr. Tobie Poet Primary Physician Perrin Maltese, MD Primary Cardiologist Nehemiah Massed Reason for Consultation NSTEMI  HPI:  Ash Brach is an 87 year old female with a history of HFrEF (25-30%), frequent PVCs, hypertension, hyperlipidemia, OSA who presented to the hospital on 10/24/2021 for evaluation of chest pain.  Her troponin was discovered to be mildly elevated and then increasing to greater than 24,000.  EKG showed some ST depressions laterally.  She underwent urgent left heart catheterization overnight for ongoing chest pain which revealed an occluded OM 3 that was too small for intervention.  Interval history: -denies chest pain, states she had some SOB last night she attributes to a hospital staff member having strong cologne which has resolved.  -ambulated yesterday without difficulty and is ready to go home.   Review of systems complete and to be negative unless listed above     Past Medical History:  Diagnosis Date   Arthritis    ra   Asthma    CHF (congestive heart failure) (HCC)    Dysrhythmia    Edema extremities    GERD (gastroesophageal reflux disease)    Gout    Headache    History of hiatal hernia    Hypertension    Hypothyroidism    Shortness of breath dyspnea    Sleep apnea     Past Surgical History:  Procedure Laterality Date   APPENDECTOMY     BACK SURGERY     CARDIAC CATHETERIZATION     CATARACT EXTRACTION W/PHACO Left 03/09/2015   Procedure: CATARACT EXTRACTION PHACO AND INTRAOCULAR LENS PLACEMENT ([place]);  Surgeon: Estill Cotta, MD;  Location: [facility];  Service: Ophthalmology;  Laterality: Left;  [place] 01:31 AP% 26.1 CDE 43.72   CATARACT EXTRACTION W/PHACO Right 08/19/2021   Procedure: CATARACT EXTRACTION PHACO AND INTRAOCULAR LENS PLACEMENT ([place]) RIGHT;  Surgeon: Birder Robson, MD;   Location: [facility];  Service: Ophthalmology;  Laterality: Right;  12.31 1:07.0   CATARACT EXTRACTION W/PHACO Right 09/07/2021   Procedure: REMOVAL OF LENS FRAGMENTS RIGHT;  Surgeon: Birder Robson, MD;  Location: [employer];  Service: Ophthalmology;  Laterality: Right;   CHOLECYSTECTOMY     CORONARY/GRAFT ACUTE MI REVASCULARIZATION N/A 10/24/2021   Procedure: Coronary/Graft Acute MI Revascularization;  Surgeon: Nelva Bush, MD;  Location: [place] [facility];  Service: Cardiovascular;  Laterality: N/A;   EYE SURGERY     retina   JOINT REPLACEMENT     left knee   LEFT HEART CATH AND CORONARY ANGIOGRAPHY N/A 04/02/2021   Procedure: LEFT HEART CATH AND CORONARY ANGIOGRAPHY;  Surgeon: Corey Skains, MD;  Location: [place] [facility];  Service: Cardiovascular;  Laterality: N/A;   LEFT HEART CATH AND CORONARY ANGIOGRAPHY N/A 10/24/2021   Procedure: LEFT HEART CATH AND CORONARY ANGIOGRAPHY;  Surgeon: Nelva Bush, MD;  Location: [place] [facility];  Service: Cardiovascular;  Laterality: N/A;    Medications Prior to Admission  Medication Sig Dispense Refill Last Dose   carvedilol (COREG) 12.5 MG tablet Take 12.5 mg by mouth in the morning and at bedtime.      cetirizine (ZYRTEC) 10 MG tablet Take 10 mg by mouth daily.   10/23/2021 at NIGHT   fluticasone (FLONASE) 50 MCG/ACT nasal spray Place 1 spray into both nostrils 2 (two) times daily as needed for allergies or rhinitis.   10/23/2021 at NIGHT   furosemide (LASIX) 40  MG tablet Take 0.5 tablets (20 mg total) by mouth daily. 45 tablet 3 10/23/2021 at NIGHT   levothyroxine (SYNTHROID) 112 MCG tablet Take 112 mcg by mouth daily.   10/23/2021 at NIGHT   metoprolol succinate (TOPROL-XL) 50 MG 24 hr tablet Take 1 tablet (50 mg total) by mouth daily. Take with or immediately following a meal. (Patient taking differently: Take 25 mg by mouth daily. Take with or immediately following a meal.) 90 tablet 1 10/23/2021    mometasone-formoterol (DULERA) 200-5 MCG/ACT AERO Inhale 2 puffs into the lungs 2 (two) times daily. 1 each 1 Past Week   omeprazole (PRILOSEC OTC) 20 MG tablet Take 2 tablets (40 mg total) by mouth daily. (Patient taking differently: Take 40 mg by mouth daily as needed (reflux).) 5 tablet 0 Past Week at PRN   telmisartan (MICARDIS) 40 MG tablet Take 80 mg by mouth daily.   10/23/2021 at NIGHT   albuterol (VENTOLIN HFA) 108 (90 Base) MCG/ACT inhaler Inhale 2 puffs into the lungs every 6 (six) hours as needed for wheezing or shortness of breath.   10/22/2021   brimonidine-timolol (COMBIGAN) 0.2-0.5 % ophthalmic solution Place 1 drop into the right eye 2 (two) times daily. (Patient not taking: Reported on 10/24/2021) 3 mL 0 Not Taking   fluocinonide (LIDEX) 0.05 % external solution Apply 1 application topically once a week. (Patient not taking: Reported on 10/24/2021)   Not Taking   icosapent Ethyl (VASCEPA) 1 g capsule Take 2 g by mouth 2 (two) times daily with a meal.      meclizine (ANTIVERT) 25 MG tablet Take 25 mg by mouth 2 (two) times daily as needed for dizziness.   PRN   Netarsudil Dimesylate (RHOPRESSA) 0.02 % SOLN Place 1 drop into the right eye at bedtime. (Patient not taking: Reported on 10/24/2021) 3 mL 0 Not Taking   Prednisolon-Moxiflox-Bromfenac 1-0.5-0.075 % SOLN Place 1 drop into the right eye 4 (four) times daily. (Patient not taking: Reported on 10/24/2021) 3 mL 0 Not Taking   Social History   Socioeconomic History   Marital status: Widowed    Spouse name: Not on file   Number of children: Not on file   Years of education: Not on file   Highest education level: Not on file  Occupational History   Not on file  Tobacco Use   Smoking status: Never   Smokeless tobacco: Never  Substance and Sexual Activity   Alcohol use: No   Drug use: No   Sexual activity: Not on file  Other Topics Concern   Not on file  Social History Narrative   Not on file   Social Determinants of Health    Financial Resource Strain: Not on file  Food Insecurity: Not on file  Transportation Needs: Not on file  Physical Activity: Not on file  Stress: Not on file  Social Connections: Not on file  Intimate Partner Violence: Not on file    Family History  Problem Relation Age of Onset   Heart disease Mother    Heart disease Father    Breast cancer Cousin       Review of systems complete and found to be negative unless listed above      PHYSICAL EXAM  General: Pleasant elderly Caucasian female well developed, well nourished, in no acute distress HEENT:  Normocephalic and atramatic Neck:  No JVD.  Lungs: Clear bilaterally to auscultation Heart: HRRR . Normal S1 and S2 without gallops or murmurs.  Abdomen: Nondistended appearing  Msk:  Back normal, normal gait. Normal strength and tone for age. Extremities: No clubbing, cyanosis or edema.   Neuro: Alert and oriented X 3. Psych:  Good affect, responds appropriately  Labs:   Lab Results  Component Value Date   WBC 6.2 10/27/2021   HGB 13.1 10/27/2021   HCT 38.6 10/27/2021   MCV 89.8 10/27/2021   PLT 250 10/27/2021    Recent Labs  Lab 10/26/21 0419  NA 136  K 3.8  CL 106  CO2 27  BUN 15  CREATININE 0.74  CALCIUM 9.1  GLUCOSE 105*    Lab Results  Component Value Date   CKTOTAL 113 04/02/2021     Lab Results  Component Value Date   CHOL 185 10/25/2021   CHOL 170 04/02/2021   Lab Results  Component Value Date   HDL 49 10/25/2021   HDL 46 04/02/2021   Lab Results  Component Value Date   LDLCALC 116 (H) 10/25/2021   LDLCALC 112 (H) 04/02/2021   Lab Results  Component Value Date   TRIG 99 10/25/2021   TRIG 60 04/02/2021   Lab Results  Component Value Date   CHOLHDL 3.8 10/25/2021   CHOLHDL 3.7 04/02/2021   No results found for: LDLDIRECT    Radiology: DG Chest 2 View  Result Date: 10/24/2021 CLINICAL DATA:  87 year old female with history of shortness of breath and chest pain. EXAM: CHEST - 2  VIEW COMPARISON:  Chest x-ray 08/29/2021. FINDINGS: Lung volumes are normal. Persistent areas of peribronchial cuffing, interstitial prominence an ill-defined airspace disease throughout the mid to lower lungs bilaterally. No pleural effusions. No pneumothorax. No pulmonary nodule or mass noted. Pulmonary vasculature and the cardiomediastinal silhouette are within normal limits. Atherosclerosis in the thoracic aorta. IMPRESSION: 1. Persistent bilateral lower lobe bronchopneumonia, with slightly improved aeration in the right lower lobe compared to the prior examination. Electronically Signed   By: Vinnie Langton M.D.   On: 10/24/2021 13:21   CT CHEST W CONTRAST  Result Date: 10/11/2021 CLINICAL DATA:  Follow-up pneumonia, productive cough and congestion since November 2022 EXAM: CT CHEST WITH CONTRAST TECHNIQUE: Multidetector CT imaging of the chest was performed during intravenous contrast administration. CONTRAST:  98mL OMNIPAQUE IOHEXOL 300 MG/ML  SOLN IV COMPARISON:  04/07/2020 CT chest, chest radiographs 08/29/2021 FINDINGS: Cardiovascular: Atherosclerotic calcifications aorta, proximal great vessels, and coronary arteries. Aorta normal caliber without aneurysm. Vascular structures grossly patent on non targeted exam. Dilatation of cardiac chambers. No pericardial effusion. Mediastinum/Nodes: Small hiatal hernia. Esophagus stable, unremarkable. Base of cervical region normal appearance. No thoracic adenopathy. Lungs/Pleura: Subsegmental atelectasis in BILATERAL lower lobes. Patchy infiltrates RIGHT lower lobe, improved since prior chest radiograph. Remaining lungs clear. No pleural effusion or pneumothorax. Upper Abdomen: Visualized upper abdomen unremarkable Musculoskeletal: No acute osseous findings. IMPRESSION: Patchy infiltrates RIGHT lower lobe, improved since prior chest radiograph. Bibasilar subsegmental atelectasis. Small hiatal hernia. Scattered atherosclerotic calcifications including coronary  arteries. Aortic Atherosclerosis (ICD10-I70.0). Electronically Signed   By: Lavonia Dana M.D.   On: 10/11/2021 16:30   CARDIAC CATHETERIZATION  Result Date: 10/24/2021 Conclusions: Severe single-vessel coronary artery disease with occlusion of distal OM3 branch, which is too small/distal for intervention.  There is also mild plaquing in the mid LAD with up to 20% stenosis. Moderately elevated left ventricular filling pressure (LVEDP 25 mmHg). Recommendation: Overnight observation in stepdown unit.  If patient has recurrent chest pain, recommend initiation of IV nitroglycerin and obtaining CTA chest to evaluate for dissection (very high BP on presentation) and  pulmonary embolism (recent hospitalization for pneumonia). Continue clopidogrel 75 mg daily, given history of aspirin intolerance. Restart IV heparin in 2 hours; recommend completing 48 hours of heparin (if no contraindication develops) for medical management of NSTEMI. Follow-up echocardiogram. Aggressive secondary prevention of coronary artery disease.  Consider rechallenging with statin versus outpatient trial of PCSK-9 inhibitor. Consider gentle diuresis. Nelva Bush, MD [facility]  CT ANGIO CHEST AORTA W/CM &/OR WO/CM  Result Date: 10/25/2021 CLINICAL DATA:  Evaluate for acute aortic syndrome. Chest pain radiating into right arm. EXAM: CT ANGIOGRAPHY CHEST WITH CONTRAST TECHNIQUE: Multidetector CT imaging of the chest was performed using the standard protocol during bolus administration of intravenous contrast. Multiplanar CT image reconstructions and MIPs were obtained to evaluate the vascular anatomy. CONTRAST:  48mL OMNIPAQUE IOHEXOL 350 MG/ML SOLN COMPARISON:  10/11/2021 FINDINGS: Cardiovascular: No aortic intramural hemotoma. Preferential opacification of the thoracic aorta. No evidence of thoracic aortic aneurysm or dissection. Normal heart size. No pericardial effusion. Aortic atherosclerosis and mild coronary artery calcifications. The  main pulmonary artery appears patent. No signs of central obstructing pulmonary embolus. Mediastinum/Nodes: Thyroid gland appears atrophic or surgically absent. Trachea is patent and midline. Normal appearance of the esophagus. No enlarged axillary, supraclavicular, mediastinal, or hilar lymph nodes. Lungs/Pleura: No pleural effusion. Stable bandlike area of scarring within the posteromedial left lower lobe. Similar appearance of right lower lobe peripheral and basilar predominant areas of subsegmental atelectasis and subpleural consolidation. Findings compatible with postinflammatory or infectious process. Upper Abdomen: No acute abnormality. Aortic atherosclerosis. Hiatal hernia. Status post cholecystectomy. Unchanged appearance of left portal hepatic venous shunt within segment 3 of the liver, image 92/4. Musculoskeletal: Spondylosis identified within the thoracic spine. No acute or suspicious osseous findings. Review of the MIP images confirms the above findings. IMPRESSION: 1. No evidence for acute aortic syndrome. 2. Similar appearance of peripheral and basilar predominant areas of subsegmental atelectasis and subpleural consolidation with thickening of the peribronchovascular interstitium compatible with postinflammatory or infectious process. Suggest continued interval follow-up with repeat CT of the chest in 3 months to ensure complete resolution. 3. Aortic Atherosclerosis (ICD10-I70.0). Coronary artery calcifications. Electronically Signed   By: Kerby Moors M.D.   On: 10/25/2021 08:26   ECHOCARDIOGRAM COMPLETE  Result Date: 10/25/2021    ECHOCARDIOGRAM REPORT   Patient Name:   MARIANGELY SAWINSKI Date of Exam: 10/25/2021 Medical Rec #:  [id number]           Height:       60.0 in Accession #:    [id number]          Weight:       154.2 lb Date of Birth:  [birth year]           BSA:          1.671 m Patient Age:    42 years            BP:           130/57 mmHg Patient Gender: F                   HR:            68 bpm. Exam Location:  [facility] Procedure: 2D Echo, Color Doppler, Cardiac Doppler and Intracardiac            Opacification Agent Indications:     R07.9 Chest Pain; Elevated troponin  History:         Patient has prior history of Echocardiogram examinations, most  recent 04/02/2021. CHF; Risk Factors:Hypertension and Sleep                  Apnea.  Sonographer:     Charmayne Sheer Referring Phys:  [id number] AMY N [facility] Diagnosing Phys: Donnelly Angelica  Sonographer Comments: Suboptimal apical window and no subcostal window. IMPRESSIONS  1. Left ventricular ejection fraction, by estimation, is 50 to 55%. The left ventricle has low normal function. The left ventricle has no regional wall motion abnormalities. Left ventricular diastolic parameters are consistent with Grade II diastolic dysfunction (pseudonormalization).  2. Right ventricular systolic function is normal. The right ventricular size is normal.  3. Left atrial size was mildly dilated.  4. The mitral valve is normal in structure. No evidence of mitral valve regurgitation. No evidence of mitral stenosis.  5. The aortic valve is grossly normal. Aortic valve regurgitation is not visualized. No aortic stenosis is present. FINDINGS  Left Ventricle: Left ventricular ejection fraction, by estimation, is 50 to 55%. The left ventricle has low normal function. The left ventricle has no regional wall motion abnormalities. Definity contrast agent was given IV to delineate the left ventricular endocardial borders. The left ventricular internal cavity size was normal in size. There is no left ventricular hypertrophy. Left ventricular diastolic parameters are consistent with Grade II diastolic dysfunction (pseudonormalization). Right Ventricle: The right ventricular size is normal. Right vetricular wall thickness was not well visualized. Right ventricular systolic function is normal. Left Atrium: Left atrial size was mildly dilated. Right Atrium: Right atrial size was not  well visualized. Pericardium: There is no evidence of pericardial effusion. Mitral Valve: The mitral valve is normal in structure. No evidence of mitral valve regurgitation. No evidence of mitral valve stenosis. MV peak gradient, 3.3 mmHg. The mean mitral valve gradient is 2.0 mmHg. Tricuspid Valve: The tricuspid valve is not well visualized. Tricuspid valve regurgitation is not demonstrated. Aortic Valve: The aortic valve is grossly normal. Aortic valve regurgitation is not visualized. No aortic stenosis is present. Aortic valve mean gradient measures 6.0 mmHg. Aortic valve peak gradient measures 10.1 mmHg. Aortic valve area, by VTI measures  1.53 cm. Pulmonic Valve: The pulmonic valve was not well visualized. Pulmonic valve regurgitation is not visualized. Aorta: The aortic root is normal in size and structure. Venous: The inferior vena cava was not well visualized. IAS/Shunts: The interatrial septum was not well visualized.  LEFT VENTRICLE PLAX 2D LVIDd:         4.66 cm      Diastology LVIDs:         3.66 cm      LV e' medial:    5.44 cm/s LV PW:         1.16 cm      LV E/e' medial:  16.7 LV IVS:        0.75 cm      LV e' lateral:   5.87 cm/s LVOT diam:     2.00 cm      LV E/e' lateral: 15.5 LV SV:         46 LV SV Index:   27 LVOT Area:     3.14 cm  LV Volumes (MOD) LV vol d, MOD A2C: 80.3 ml LV vol d, MOD A4C: 113.0 ml LV vol s, MOD A2C: 48.5 ml LV vol s, MOD A4C: 56.6 ml LV SV MOD A2C:     31.8 ml LV SV MOD A4C:     113.0 ml LV SV MOD BP:  45.1 ml LEFT ATRIUM             Index LA diam:        3.80 cm 2.27 cm/m LA Vol (A2C):   51.6 ml 30.87 ml/m LA Vol (A4C):   81.3 ml 48.65 ml/m LA Biplane Vol: 65.9 ml 39.43 ml/m  AORTIC VALVE                     PULMONIC VALVE AV Area (Vmax):    1.44 cm      PV Vmax:       0.86 m/s AV Area (Vmean):   1.21 cm      PV Vmean:      56.700 cm/s AV Area (VTI):     1.53 cm      PV VTI:        0.159 m AV Vmax:           159.00 cm/s   PV Peak grad:  3.0 mmHg AV Vmean:           119.000 cm/s  PV Mean grad:  1.0 mmHg AV VTI:            0.300 m AV Peak Grad:      10.1 mmHg AV Mean Grad:      6.0 mmHg LVOT Vmax:         73.10 cm/s LVOT Vmean:        45.900 cm/s LVOT VTI:          0.146 m LVOT/AV VTI ratio: 0.49  AORTA Ao Root diam: 2.50 cm MITRAL VALVE               TRICUSPID VALVE MV Area (PHT): 2.94 cm    TR Peak grad:   25.4 mmHg MV Area VTI:   1.44 cm    TR Vmax:        252.00 cm/s MV Peak grad:  3.3 mmHg MV Mean grad:  2.0 mmHg    SHUNTS MV Vmax:       0.91 m/s    Systemic VTI:  0.15 m MV Vmean:      60.2 cm/s   Systemic Diam: 2.00 cm MV Decel Time: 258 msec MV E velocity: 90.80 cm/s MV A velocity: 80.60 cm/s MV E/A ratio:  1.13 Donnelly Angelica Electronically signed by Donnelly Angelica Signature Date/Time: 10/25/2021/1:09:32 PM    Final     EKG: Sinus rhythm. Lateral ST depressions of ~ 1 mm.   ASSESSMENT AND PLAN:  Natanya Bicknell is an 87 year old female with a history of HFrEF (25-30%), frequent PVCs, hypertension, hyperlipidemia, OSA who presented to the hospital on 10/24/2021 for evaluation of chest pain and was discovered to have an NSTEMI with a troponin greater than 24,000.  Underwent left heart cath 10/24/2021 which revealed an occluded OM 3 that was too small for intervention.  #NSTEMI, chest pain The patient presents with several hours of ongoing severe chest pain, and subsequently discovered to have a troponin that rose from near normal to greater than 24,000 over 6-hour period.  EKG showed lateral ST depressions but otherwise was nondiagnostic.  Heart catheterization showed no clear culprit for this aside from a possibly occluded small OM 3 vessel. -We will hold on aspirin given previous allergy -s/p Plavix 300 mg.  Continue 75 mg daily indefinitely -completed 48 hrs heparin. -Continue metoprolol XL 25 mg; takes 50 mg at home. HR has been high 50s, low 60s during her hospitalization. Will continue 25 at  discharge and further mgmt as an outpatient basis.  -Patient has  listed allergy to atorvastatin; she states she doesn't like that medicine and wont take it. Says she had hives to crestor.   - continue pravastatin 20 mg daily -Continue Vascepa 2 g twice daily at discharge -Echocardiogram revealed improved ejection fraction to 50-55% compared to 25-35% 03/2021  -No further cardiac diagnostics at this time. She is stable for discharge today from a cardiac standpoint. Will recommend close outpatient follow-up with Dr. Nehemiah Massed at discharge.  #Chronic HF with improved EF (EF 50-55% up from 25-30%) -Metoprolol XL as above -On telmisartan 80 mg daily at home, continue at discharge; which is not on formulary at the hospital. Irbesartan 300mg  once daily while hospitalized  Signed: Tristan Schroeder PA-C 10/27/2021, 10:43 AM

## 2021-10-27 NOTE — Care Management Important Message (Signed)
Important Message  Patient Details  Name: Andrea Phillips MRN: [id number] Date of Birth: [birth year]   Medicare Important Message Given:  Yes     [name] 10/27/2021, 10:21 AM

## 2021-10-28 NOTE — Discharge Summary (Signed)
Physician Discharge Summary  Andrea Phillips [id number] DOB: June 13, 1935 DOA: 10/24/2021  PCP: Perrin Maltese, MD  Admit date: 10/24/2021 Discharge date: 10/27/2021  Admitted From: Home Disposition: Home  Recommendations for Outpatient Follow-up:  Follow up with PCP within 1-2 weeks for general check up Follow up with cardiology for medication management for HF  Discharge Condition:stable, improved CODE STATUS:  Code Status: Prior  Cardiac diet  Brief/Interim Summary: Pt presented to ED with acute onset of chest pain. EKG changes and elevated troponins suggested NSTEMI. Cardiology was consulted for ACS rule out and she underwent catheterization 1/1 showing non-obstructive disease. She was kept on heparin gtt 48 hrs and had resolution of chest pain even with exertion. Her medications were optimized as listed below and instructed to follow up with cardiology outpatient.  Her other chronic conditions were managed with home medications.   Discharge Diagnoses:  Principal Problem:   NSTEMI (non-ST elevated myocardial infarction) ([facility]) Active Problems:   Essential hypertension   Frequent PVCs   OSA (obstructive sleep apnea)   Elevated troponin   Acute on chronic systolic CHF (congestive heart failure) (HCC)   Hypothyroidism   Hyperlipidemia, mixed   H/O medication noncompliance   Chest pain   Discharge Instructions     AMB Referral to Cardiac Rehabilitation - Phase II   Complete by: As directed    Diagnosis: NSTEMI   After initial evaluation and assessments completed: Virtual Based Care may be provided alone or in conjunction with Phase 2 Cardiac Rehab based on patient barriers.: Yes      Allergies as of 10/27/2021       Reactions   Aspirin Hives   Other Hives   Yellow dye 6 FOOD COLOR YELLOW POWDER   Atorvastatin Other (See Comments)   unknown   Codeine Other (See Comments)   Doesn't know   Conj Estrog-medroxyprogest Ace Other (See Comments)   PREMPRO 0.3-1.5 MG ORAL  TABLET unknown   Prednisone Other (See Comments)   Chest pain   Raloxifene Other (See Comments)   EVISTA 60 MG ORAL TABLET unknown   Ace Inhibitors Cough   Amlodipine Itching   Valsartan Itching        Medication List     STOP taking these medications    brimonidine-timolol 0.2-0.5 % ophthalmic solution Commonly known as: COMBIGAN   carvedilol 12.5 MG tablet Commonly known as: COREG   fluocinonide 0.05 % external solution Commonly known as: LIDEX   furosemide 40 MG tablet Commonly known as: Lasix   icosapent Ethyl 1 g capsule Commonly known as: VASCEPA   omeprazole 20 MG tablet Commonly known as: PriLOSEC OTC   Prednisolon-Moxiflox-Bromfenac 1-0.5-0.075 % Soln   Rhopressa 0.02 % Soln Generic drug: Netarsudil Dimesylate       TAKE these medications    albuterol 108 (90 Base) MCG/ACT inhaler Commonly known as: VENTOLIN HFA Inhale 2 puffs into the lungs every 6 (six) hours as needed for wheezing or shortness of breath.   cetirizine 10 MG tablet Commonly known as: ZYRTEC Take 10 mg by mouth daily.   clopidogrel 75 MG tablet Commonly known as: PLAVIX Take 1 tablet (75 mg total) by mouth daily with breakfast.   Dulera 200-5 MCG/ACT Aero Generic drug: mometasone-formoterol Inhale 2 puffs into the lungs 2 (two) times daily.   fluticasone 50 MCG/ACT nasal spray Commonly known as: FLONASE Place 1 spray into both nostrils 2 (two) times daily as needed for allergies or rhinitis.   levothyroxine 112 MCG tablet Commonly known  as: SYNTHROID Take 112 mcg by mouth daily.   meclizine 25 MG tablet Commonly known as: ANTIVERT Take 25 mg by mouth 2 (two) times daily as needed for dizziness.   metoprolol succinate 25 MG 24 hr tablet Commonly known as: TOPROL-XL Take 1 tablet (25 mg total) by mouth daily. What changed:  medication strength how much to take additional instructions   pravastatin 20 MG tablet Commonly known as: PRAVACHOL Take 1 tablet (20 mg  total) by mouth daily at 6 PM.   telmisartan 40 MG tablet Commonly known as: MICARDIS Take 80 mg by mouth daily.        Follow-up Information     Corey Skains, MD. Go on 11/04/2021.   Specialty: Cardiology Why: 1-2 weeks after discharge..@ 3:45pm Contact information: [address] [facility] [place] [address] [phone]                Allergies  Allergen Reactions   Aspirin Hives   Other Hives    Yellow dye 6 FOOD COLOR YELLOW POWDER    Atorvastatin Other (See Comments)    unknown   Codeine Other (See Comments)    Doesn't know   Conj Estrog-Medroxyprogest Ace Other (See Comments)    PREMPRO 0.3-1.5 MG ORAL TABLET unknown   Prednisone Other (See Comments)    Chest pain   Raloxifene Other (See Comments)    EVISTA 60 MG ORAL TABLET unknown   Ace Inhibitors Cough   Amlodipine Itching   Valsartan Itching   Consultations: Cardiology   Procedures/Studies: DG Chest 2 View  Result Date: 10/24/2021 CLINICAL DATA:  87 year old female with history of shortness of breath and chest pain. EXAM: CHEST - 2 VIEW COMPARISON:  Chest x-ray 08/29/2021. FINDINGS: Lung volumes are normal. Persistent areas of peribronchial cuffing, interstitial prominence an ill-defined airspace disease throughout the mid to lower lungs bilaterally. No pleural effusions. No pneumothorax. No pulmonary nodule or mass noted. Pulmonary vasculature and the cardiomediastinal silhouette are within normal limits. Atherosclerosis in the thoracic aorta. IMPRESSION: 1. Persistent bilateral lower lobe bronchopneumonia, with slightly improved aeration in the right lower lobe compared to the prior examination. Electronically Signed   By: Vinnie Langton M.D.   On: 10/24/2021 13:21   CT CHEST W CONTRAST  Result Date: 10/11/2021 CLINICAL DATA:  Follow-up pneumonia, productive cough and congestion since November 2022 EXAM: CT CHEST WITH CONTRAST TECHNIQUE: Multidetector CT  imaging of the chest was performed during intravenous contrast administration. CONTRAST:  17mL OMNIPAQUE IOHEXOL 300 MG/ML  SOLN IV COMPARISON:  04/07/2020 CT chest, chest radiographs 08/29/2021 FINDINGS: Cardiovascular: Atherosclerotic calcifications aorta, proximal great vessels, and coronary arteries. Aorta normal caliber without aneurysm. Vascular structures grossly patent on non targeted exam. Dilatation of cardiac chambers. No pericardial effusion. Mediastinum/Nodes: Small hiatal hernia. Esophagus stable, unremarkable. Base of cervical region normal appearance. No thoracic adenopathy. Lungs/Pleura: Subsegmental atelectasis in BILATERAL lower lobes. Patchy infiltrates RIGHT lower lobe, improved since prior chest radiograph. Remaining lungs clear. No pleural effusion or pneumothorax. Upper Abdomen: Visualized upper abdomen unremarkable Musculoskeletal: No acute osseous findings. IMPRESSION: Patchy infiltrates RIGHT lower lobe, improved since prior chest radiograph. Bibasilar subsegmental atelectasis. Small hiatal hernia. Scattered atherosclerotic calcifications including coronary arteries. Aortic Atherosclerosis (ICD10-I70.0). Electronically Signed   By: Lavonia Dana M.D.   On: 10/11/2021 16:30   CARDIAC CATHETERIZATION  Result Date: 10/24/2021 Conclusions: Severe single-vessel coronary artery disease with occlusion of distal OM3 branch, which is too small/distal for intervention.  There is also mild plaquing in the  mid LAD with up to 20% stenosis. Moderately elevated left ventricular filling pressure (LVEDP 25 mmHg). Recommendation: Overnight observation in stepdown unit.  If patient has recurrent chest pain, recommend initiation of IV nitroglycerin and obtaining CTA chest to evaluate for dissection (very high BP on presentation) and pulmonary embolism (recent hospitalization for pneumonia). Continue clopidogrel 75 mg daily, given history of aspirin intolerance. Restart IV heparin in 2 hours; recommend  completing 48 hours of heparin (if no contraindication develops) for medical management of NSTEMI. Follow-up echocardiogram. Aggressive secondary prevention of coronary artery disease.  Consider rechallenging with statin versus outpatient trial of PCSK-9 inhibitor. Consider gentle diuresis. Nelva Bush, MD [facility]  CT ANGIO CHEST AORTA W/CM &/OR WO/CM  Result Date: 10/25/2021 CLINICAL DATA:  Evaluate for acute aortic syndrome. Chest pain radiating into right arm. EXAM: CT ANGIOGRAPHY CHEST WITH CONTRAST TECHNIQUE: Multidetector CT imaging of the chest was performed using the standard protocol during bolus administration of intravenous contrast. Multiplanar CT image reconstructions and MIPs were obtained to evaluate the vascular anatomy. CONTRAST:  11mL OMNIPAQUE IOHEXOL 350 MG/ML SOLN COMPARISON:  10/11/2021 FINDINGS: Cardiovascular: No aortic intramural hemotoma. Preferential opacification of the thoracic aorta. No evidence of thoracic aortic aneurysm or dissection. Normal heart size. No pericardial effusion. Aortic atherosclerosis and mild coronary artery calcifications. The main pulmonary artery appears patent. No signs of central obstructing pulmonary embolus. Mediastinum/Nodes: Thyroid gland appears atrophic or surgically absent. Trachea is patent and midline. Normal appearance of the esophagus. No enlarged axillary, supraclavicular, mediastinal, or hilar lymph nodes. Lungs/Pleura: No pleural effusion. Stable bandlike area of scarring within the posteromedial left lower lobe. Similar appearance of right lower lobe peripheral and basilar predominant areas of subsegmental atelectasis and subpleural consolidation. Findings compatible with postinflammatory or infectious process. Upper Abdomen: No acute abnormality. Aortic atherosclerosis. Hiatal hernia. Status post cholecystectomy. Unchanged appearance of left portal hepatic venous shunt within segment 3 of the liver, image 92/4. Musculoskeletal:  Spondylosis identified within the thoracic spine. No acute or suspicious osseous findings. Review of the MIP images confirms the above findings. IMPRESSION: 1. No evidence for acute aortic syndrome. 2. Similar appearance of peripheral and basilar predominant areas of subsegmental atelectasis and subpleural consolidation with thickening of the peribronchovascular interstitium compatible with postinflammatory or infectious process. Suggest continued interval follow-up with repeat CT of the chest in 3 months to ensure complete resolution. 3. Aortic Atherosclerosis (ICD10-I70.0). Coronary artery calcifications. Electronically Signed   By: Kerby Moors M.D.   On: 10/25/2021 08:26   ECHOCARDIOGRAM COMPLETE  Result Date: 10/25/2021    ECHOCARDIOGRAM REPORT   Patient Name:   Andrea Phillips Date of Exam: 10/25/2021 Medical Rec #:  [id number]           Height:       60.0 in Accession #:    [id number]          Weight:       154.2 lb Date of Birth:  1935/02/10           BSA:          1.671 m Patient Age:    14 years            BP:           130/57 mmHg Patient Gender: F                   HR:           68 bpm. Exam Location:  [facility] Procedure: 2D Echo, Color Doppler,  Cardiac Doppler and Intracardiac            Opacification Agent Indications:     R07.9 Chest Pain; Elevated troponin  History:         Patient has prior history of Echocardiogram examinations, most                  recent 04/02/2021. CHF; Risk Factors:Hypertension and Sleep                  Apnea.  Sonographer:     Charmayne Sheer Referring Phys:  [id number] AMY N COX Diagnosing Phys: Donnelly Angelica  Sonographer Comments: Suboptimal apical window and no subcostal window. IMPRESSIONS  1. Left ventricular ejection fraction, by estimation, is 50 to 55%. The left ventricle has low normal function. The left ventricle has no regional wall motion abnormalities. Left ventricular diastolic parameters are consistent with Grade II diastolic dysfunction (pseudonormalization).  2. Right  ventricular systolic function is normal. The right ventricular size is normal.  3. Left atrial size was mildly dilated.  4. The mitral valve is normal in structure. No evidence of mitral valve regurgitation. No evidence of mitral stenosis.  5. The aortic valve is grossly normal. Aortic valve regurgitation is not visualized. No aortic stenosis is present. FINDINGS  Left Ventricle: Left ventricular ejection fraction, by estimation, is 50 to 55%. The left ventricle has low normal function. The left ventricle has no regional wall motion abnormalities. Definity contrast agent was given IV to delineate the left ventricular endocardial borders. The left ventricular internal cavity size was normal in size. There is no left ventricular hypertrophy. Left ventricular diastolic parameters are consistent with Grade II diastolic dysfunction (pseudonormalization). Right Ventricle: The right ventricular size is normal. Right vetricular wall thickness was not well visualized. Right ventricular systolic function is normal. Left Atrium: Left atrial size was mildly dilated. Right Atrium: Right atrial size was not well visualized. Pericardium: There is no evidence of pericardial effusion. Mitral Valve: The mitral valve is normal in structure. No evidence of mitral valve regurgitation. No evidence of mitral valve stenosis. MV peak gradient, 3.3 mmHg. The mean mitral valve gradient is 2.0 mmHg. Tricuspid Valve: The tricuspid valve is not well visualized. Tricuspid valve regurgitation is not demonstrated. Aortic Valve: The aortic valve is grossly normal. Aortic valve regurgitation is not visualized. No aortic stenosis is present. Aortic valve mean gradient measures 6.0 mmHg. Aortic valve peak gradient measures 10.1 mmHg. Aortic valve area, by VTI measures  1.53 cm. Pulmonic Valve: The pulmonic valve was not well visualized. Pulmonic valve regurgitation is not visualized. Aorta: The aortic root is normal in size and structure. Venous: The  inferior vena cava was not well visualized. IAS/Shunts: The interatrial septum was not well visualized.  LEFT VENTRICLE PLAX 2D LVIDd:         4.66 cm      Diastology LVIDs:         3.66 cm      LV e' medial:    5.44 cm/s LV PW:         1.16 cm      LV E/e' medial:  16.7 LV IVS:        0.75 cm      LV e' lateral:   5.87 cm/s LVOT diam:     2.00 cm      LV E/e' lateral: 15.5 LV SV:         46 LV SV Index:   27 LVOT Area:  3.14 cm  LV Volumes (MOD) LV vol d, MOD A2C: 80.3 ml LV vol d, MOD A4C: 113.0 ml LV vol s, MOD A2C: 48.5 ml LV vol s, MOD A4C: 56.6 ml LV SV MOD A2C:     31.8 ml LV SV MOD A4C:     113.0 ml LV SV MOD BP:      45.1 ml LEFT ATRIUM             Index LA diam:        3.80 cm 2.27 cm/m LA Vol (A2C):   51.6 ml 30.87 ml/m LA Vol (A4C):   81.3 ml 48.65 ml/m LA Biplane Vol: 65.9 ml 39.43 ml/m  AORTIC VALVE                     PULMONIC VALVE AV Area (Vmax):    1.44 cm      PV Vmax:       0.86 m/s AV Area (Vmean):   1.21 cm      PV Vmean:      56.700 cm/s AV Area (VTI):     1.53 cm      PV VTI:        0.159 m AV Vmax:           159.00 cm/s   PV Peak grad:  3.0 mmHg AV Vmean:          119.000 cm/s  PV Mean grad:  1.0 mmHg AV VTI:            0.300 m AV Peak Grad:      10.1 mmHg AV Mean Grad:      6.0 mmHg LVOT Vmax:         73.10 cm/s LVOT Vmean:        45.900 cm/s LVOT VTI:          0.146 m LVOT/AV VTI ratio: 0.49  AORTA Ao Root diam: 2.50 cm MITRAL VALVE               TRICUSPID VALVE MV Area (PHT): 2.94 cm    TR Peak grad:   25.4 mmHg MV Area VTI:   1.44 cm    TR Vmax:        252.00 cm/s MV Peak grad:  3.3 mmHg MV Mean grad:  2.0 mmHg    SHUNTS MV Vmax:       0.91 m/s    Systemic VTI:  0.15 m MV Vmean:      60.2 cm/s   Systemic Diam: 2.00 cm MV Decel Time: 258 msec MV E velocity: 90.80 cm/s MV A velocity: 80.60 cm/s MV E/A ratio:  1.13 Donnelly Angelica Electronically signed by Donnelly Angelica Signature Date/Time: 10/25/2021/1:09:32 PM    Final     Subjective: Patient reports no complaints. She denies CP,  SOB. Able to ambulate without DOE.   Discharge Exam: Vitals:   10/27/21 0807 10/27/21 0850  BP: (!) 155/83   Pulse: (!) 55 71  Resp: 18   Temp: 97.6 F (36.4 C)   SpO2: 100%     General: Pt is alert, awake, not in acute distress Cardiovascular: RRR, S1/S2 +, no rubs, no gallops Respiratory: CTA bilaterally, no wheezing, no rhonchi Abdominal: Soft, NT, ND, bowel sounds + Extremities: no edema, no cyanosis  Labs: Basic Metabolic Panel: Recent Labs  Lab 10/24/21 1226 10/24/21 1929 10/26/21 0419  NA 136  --  136  K 3.9  --  3.8  CL 104  --  106  CO2 28  --  27  GLUCOSE 117*  --  105*  BUN 20  --  15  CREATININE 0.94  --  0.74  CALCIUM 9.9  --  9.1  MG  --  2.2 1.9   CBC: Recent Labs  Lab 10/24/21 1226 10/26/21 0419 10/27/21 0801  WBC 6.3 7.4 6.2  HGB 14.0 12.7 13.1  HCT 42.6 37.0 38.6  MCV 90.8 88.9 89.8  PLT 288 240 250    Microbiology Recent Results (from the past 240 hour(s))  Resp Panel by RT-PCR (Flu A&B, Covid) Nasopharyngeal Swab     Status: None   Collection Time: 10/24/21  7:29 PM   Specimen: Nasopharyngeal Swab; Nasopharyngeal(NP) swabs in vial transport medium  Result Value Ref Range Status   SARS Coronavirus 2 by RT PCR NEGATIVE NEGATIVE Final    Comment: (NOTE) SARS-CoV-2 target nucleic acids are NOT DETECTED.  The SARS-CoV-2 RNA is generally detectable in upper respiratory specimens during the acute phase of infection. The lowest concentration of SARS-CoV-2 viral copies this assay can detect is 138 copies/mL. A negative result does not preclude SARS-Cov-2 infection and should not be used as the sole basis for treatment or other patient management decisions. A negative result may occur with  improper specimen collection/handling, submission of specimen other than nasopharyngeal swab, presence of viral mutation(s) within the areas targeted by this assay, and inadequate number of viral copies(<138 copies/mL). A negative result must be  combined with clinical observations, patient history, and epidemiological information. The expected result is Negative.  Fact Sheet for Patients:  EntrepreneurPulse.com.au  Fact Sheet for Healthcare Providers:  IncredibleEmployment.be  This test is no t yet approved or cleared by the [place] FDA and  has been authorized for detection and/or diagnosis of SARS-CoV-2 by FDA under an Emergency Use Authorization (EUA). This EUA will remain  in effect (meaning this test can be used) for the duration of the COVID-19 declaration under Section 564(b)(1) of the Act, 21 U.S.C.section 360bbb-3(b)(1), unless the authorization is terminated  or revoked sooner.       Influenza A by PCR NEGATIVE NEGATIVE Final   Influenza B by PCR NEGATIVE NEGATIVE Final    Comment: (NOTE) The Xpert Xpress SARS-CoV-2/FLU/RSV plus assay is intended as an aid in the diagnosis of influenza from Nasopharyngeal swab specimens and should not be used as a sole basis for treatment. Nasal washings and aspirates are unacceptable for Xpert Xpress SARS-CoV-2/FLU/RSV testing.  Fact Sheet for Patients: EntrepreneurPulse.com.au  Fact Sheet for Healthcare Providers: IncredibleEmployment.be  This test is not yet approved or cleared by the [place] FDA and has been authorized for detection and/or diagnosis of SARS-CoV-2 by FDA under an Emergency Use Authorization (EUA). This EUA will remain in effect (meaning this test can be used) for the duration of the COVID-19 declaration under Section 564(b)(1) of the Act, 21 U.S.C. section 360bbb-3(b)(1), unless the authorization is terminated or revoked.  Performed at [facility], [address] [place], [place] [address]   MRSA Next Gen by PCR, Nasal     Status: None   Collection Time: 10/25/21 11:24 AM   Specimen: Nasal Mucosa; Nasal Swab  Result Value Ref Range Status   MRSA by PCR  Next Gen NOT DETECTED NOT DETECTED Final    Comment: (NOTE) The GeneXpert MRSA Assay (FDA approved for NASAL specimens only), is one component of a comprehensive MRSA colonization surveillance program. It is not intended to diagnose MRSA infection nor to guide or  monitor treatment for MRSA infections. Test performance is not FDA approved in patients less than 58 years old. Performed at [facility], [address], [place], [place] [address]     Time coordinating discharge: Over 30 minutes  Richarda Osmond, MD  [facility] 10/28/2021, 12:37 PM

## 2021-11-05 DIAGNOSIS — I2510 Atherosclerotic heart disease of native coronary artery without angina pectoris: Secondary | ICD-10-CM | POA: Diagnosis present

## 2021-11-14 ENCOUNTER — Emergency Department: Payer: Medicare Other

## 2021-11-14 ENCOUNTER — Encounter: Payer: Self-pay | Admitting: Emergency Medicine

## 2021-11-14 ENCOUNTER — Other Ambulatory Visit: Payer: Self-pay

## 2021-11-14 ENCOUNTER — Observation Stay
Admission: EM | Admit: 2021-11-14 | Discharge: 2021-11-16 | Disposition: A | Discharge status: Home / Self Care | Payer: Medicare Other | Attending: Internal Medicine | Admitting: Internal Medicine

## 2021-11-14 DIAGNOSIS — I214 Non-ST elevation (NSTEMI) myocardial infarction: Secondary | ICD-10-CM | POA: Diagnosis present

## 2021-11-14 DIAGNOSIS — I10 Essential (primary) hypertension: Secondary | ICD-10-CM | POA: Diagnosis present

## 2021-11-14 DIAGNOSIS — A419 Sepsis, unspecified organism: Secondary | ICD-10-CM | POA: Insufficient documentation

## 2021-11-14 DIAGNOSIS — Z96652 Presence of left artificial knee joint: Secondary | ICD-10-CM | POA: Insufficient documentation

## 2021-11-14 DIAGNOSIS — R778 Other specified abnormalities of plasma proteins: Secondary | ICD-10-CM | POA: Insufficient documentation

## 2021-11-14 DIAGNOSIS — Z7902 Long term (current) use of antithrombotics/antiplatelets: Secondary | ICD-10-CM | POA: Diagnosis not present

## 2021-11-14 DIAGNOSIS — Y92009 Unspecified place in unspecified non-institutional (private) residence as the place of occurrence of the external cause: Secondary | ICD-10-CM | POA: Insufficient documentation

## 2021-11-14 DIAGNOSIS — J45909 Unspecified asthma, uncomplicated: Secondary | ICD-10-CM | POA: Diagnosis not present

## 2021-11-14 DIAGNOSIS — E039 Hypothyroidism, unspecified: Secondary | ICD-10-CM | POA: Diagnosis not present

## 2021-11-14 DIAGNOSIS — Z20822 Contact with and (suspected) exposure to covid-19: Secondary | ICD-10-CM | POA: Diagnosis not present

## 2021-11-14 DIAGNOSIS — E782 Mixed hyperlipidemia: Secondary | ICD-10-CM | POA: Diagnosis present

## 2021-11-14 DIAGNOSIS — S0990XA Unspecified injury of head, initial encounter: Secondary | ICD-10-CM | POA: Diagnosis not present

## 2021-11-14 DIAGNOSIS — W1839XA Other fall on same level, initial encounter: Secondary | ICD-10-CM | POA: Insufficient documentation

## 2021-11-14 DIAGNOSIS — R7989 Other specified abnormal findings of blood chemistry: Secondary | ICD-10-CM | POA: Diagnosis present

## 2021-11-14 DIAGNOSIS — I509 Heart failure, unspecified: Secondary | ICD-10-CM | POA: Insufficient documentation

## 2021-11-14 DIAGNOSIS — R197 Diarrhea, unspecified: Principal | ICD-10-CM

## 2021-11-14 DIAGNOSIS — R55 Syncope and collapse: Principal | ICD-10-CM | POA: Insufficient documentation

## 2021-11-14 DIAGNOSIS — I959 Hypotension, unspecified: Secondary | ICD-10-CM

## 2021-11-14 DIAGNOSIS — Z79899 Other long term (current) drug therapy: Secondary | ICD-10-CM | POA: Diagnosis not present

## 2021-11-14 DIAGNOSIS — R6520 Severe sepsis without septic shock: Secondary | ICD-10-CM | POA: Diagnosis not present

## 2021-11-14 DIAGNOSIS — I110 Hypertensive heart disease with heart failure: Secondary | ICD-10-CM | POA: Diagnosis not present

## 2021-11-14 DIAGNOSIS — N179 Acute kidney failure, unspecified: Secondary | ICD-10-CM | POA: Diagnosis not present

## 2021-11-14 LAB — COMPREHENSIVE METABOLIC PANEL
ALT: 13 U/L (ref 0–44)
AST: 20 U/L (ref 15–41)
Albumin: 3.6 g/dL (ref 3.5–5.0)
Alkaline Phosphatase: 50 U/L (ref 38–126)
Anion gap: 10 (ref 5–15)
BUN: 35 mg/dL — ABNORMAL HIGH (ref 8–23)
CO2: 25 mmol/L (ref 22–32)
Calcium: 10.5 mg/dL — ABNORMAL HIGH (ref 8.9–10.3)
Chloride: 104 mmol/L (ref 98–111)
Creatinine, Ser: 1.61 mg/dL — ABNORMAL HIGH (ref 0.44–1.00)
GFR, Estimated: 31 mL/min — ABNORMAL LOW (ref >60)
Glucose, Bld: 156 mg/dL — ABNORMAL HIGH (ref 70–99)
Potassium: 4.3 mmol/L (ref 3.5–5.1)
Sodium: 139 mmol/L (ref 135–145)
Total Bilirubin: 1.1 mg/dL (ref 0.3–1.2)
Total Protein: 6.8 g/dL (ref 6.5–8.1)

## 2021-11-14 LAB — CBC WITH DIFFERENTIAL/PLATELET
Abs Immature Granulocytes: 0.02 10*3/uL (ref 0.00–0.07)
Basophils Absolute: 0.1 10*3/uL (ref 0.0–0.1)
Basophils Relative: 1 %
Eosinophils Absolute: 0.2 10*3/uL (ref 0.0–0.5)
Eosinophils Relative: 2 %
HCT: 46.3 % — ABNORMAL HIGH (ref 36.0–46.0)
Hemoglobin: 15.3 g/dL — ABNORMAL HIGH (ref 12.0–15.0)
Immature Granulocytes: 0 %
Lymphocytes Relative: 32 %
Lymphs Abs: 2.6 10*3/uL (ref 0.7–4.0)
MCH: 30.5 pg (ref 26.0–34.0)
MCHC: 33 g/dL (ref 30.0–36.0)
MCV: 92.4 fL (ref 80.0–100.0)
Monocytes Absolute: 0.6 10*3/uL (ref 0.1–1.0)
Monocytes Relative: 7 %
Neutro Abs: 4.8 10*3/uL (ref 1.7–7.7)
Neutrophils Relative %: 58 %
Platelets: 317 10*3/uL (ref 150–400)
RBC: 5.01 MIL/uL (ref 3.87–5.11)
RDW: 14.6 % (ref 11.5–15.5)
WBC: 8.2 10*3/uL (ref 4.0–10.5)
nRBC: 0 % (ref 0.0–0.2)

## 2021-11-14 LAB — PROTIME-INR
INR: 1 (ref 0.8–1.2)
Prothrombin Time: 13.2 seconds (ref 11.4–15.2)

## 2021-11-14 LAB — LACTIC ACID, PLASMA: Lactic Acid, Venous: 2.7 mmol/L (ref 0.5–1.9)

## 2021-11-14 LAB — APTT: aPTT: 33 seconds (ref 24–36)

## 2021-11-14 LAB — TROPONIN I (HIGH SENSITIVITY): Troponin I (High Sensitivity): 23 ng/L — ABNORMAL HIGH (ref <18)

## 2021-11-14 MED ORDER — LACTATED RINGERS IV BOLUS (SEPSIS)
1000.0000 mL | Freq: Once | INTRAVENOUS | Status: AC
  Administered 2021-11-15: 1000 mL via INTRAVENOUS

## 2021-11-14 MED ORDER — SODIUM CHLORIDE 0.9 % IV SOLN
2.0000 g | Freq: Once | INTRAVENOUS | Status: AC
  Administered 2021-11-15: 2 g via INTRAVENOUS
  Filled 2021-11-14: qty 2

## 2021-11-14 MED ORDER — VANCOMYCIN HCL IN DEXTROSE 1-5 GM/200ML-% IV SOLN
1000.0000 mg | Freq: Once | INTRAVENOUS | Status: AC
  Administered 2021-11-15: 1000 mg via INTRAVENOUS
  Filled 2021-11-14: qty 200

## 2021-11-14 MED ORDER — LACTATED RINGERS IV SOLN: INTRAVENOUS | Status: DC

## 2021-11-14 MED ORDER — LACTATED RINGERS IV BOLUS
1000.0000 mL | Freq: Once | INTRAVENOUS | Status: AC
  Administered 2021-11-15: 1000 mL via INTRAVENOUS

## 2021-11-14 MED ORDER — METRONIDAZOLE 500 MG/100ML IV SOLN
500.0000 mg | Freq: Once | INTRAVENOUS | Status: AC
  Administered 2021-11-15: 500 mg via INTRAVENOUS
  Filled 2021-11-14: qty 100

## 2021-11-14 NOTE — Sepsis Progress Note (Signed)
ELink following for Sepsis Protocol    

## 2021-11-14 NOTE — ED Notes (Signed)
Critical lactic--2.7. MD notified and aware

## 2021-11-14 NOTE — Consult Note (Signed)
PHARMACY -  BRIEF ANTIBIOTIC NOTE   Pharmacy has received consult(s) for vancomycin and cefepime from an ED provider.  The patient's profile has been reviewed for ht/wt/allergies/indication/available labs.    One time order(s) placed for cefepime 2 g and vancomycin 1 g (no weight entered)   Further antibiotics/pharmacy consults should be ordered by admitting physician if indicated.                       Thank you, Derrek Gu, PharmD 11/14/2021  10:25 PM

## 2021-11-14 NOTE — ED Triage Notes (Signed)
Pt to ED via [facility] from home with c/o fall, per EMS pt fell between closet and bed, had an episode of diarrhea incontinence at home. Per EMS pt started new BP med with last admission to hospital, initial BP on scene 56/34, was able to increase BP to 61/34 with 200 cc's IVF, however lost IV access en route and unable to start another IV. Pt presents to ED alert and oriented, able to to answer all questions at this time.

## 2021-11-14 NOTE — ED Provider Notes (Addendum)
[facility] Provider Note    Event Date/Time   First MD Initiated Contact with Patient 11/14/21 2220     (approximate)   History   Fall and Hypotension   HPI  Andrea Phillips is a 87 y.o. female who comes from home with syncope.  She had a recent admission on the first of the month for NSTEMI.  She was started on telmisartan and metoprolol.  She apparently had been doing fairly well but today she went downstairs to change and get into bed.  Granddaughter heard her fell.  Patient's daughter went to check on her and found her on the floor.  She had gotten naked apparently to change and get into bed.  She initially was conscious then became groggy.  Now she is little bit more awake again.  EMS arrived found a blood pressure 56 systolic.  They started IV fluid but patient moved an IV was lost.  Patient had diarrheal stool about the time she syncopized.  Patient is now awake and alert.  She does not remember what happened.  She denies any fever cough shortness of breath chest pain belly pain or other complaints.  She had pain in her hips but this predates the fall.      Physical Exam   Triage Vital Signs: ED Triage Vitals  Enc Vitals Group     BP      Pulse      Resp      Temp      Temp src      SpO2      Weight      Height      Head Circumference      Peak Flow      Pain Score      Pain Loc      Pain Edu?      Excl. in GC?     Most recent vital signs: Vitals:   11/14/21 2300 11/15/21 0025  BP: (!) 104/54 117/62  Pulse: 65 (!) 59  Resp: 11 17  SpO2: 97% 96%     General: Awake, slightly groggy.  No distress CV:  Patient is pale pulses are faint heart regular rate and rhythm no audible murmurs Resp:  Normal effort.  Lungs are clear Abd:  No distention.  Soft and nontender Extremities with no edema Neuro patient is alert oriented cranial nerves II through XII appear to be intact motor strength is 5/5 throughout patient moves all  extremities equally and well patient does not report any numbness   ED Results / Procedures / Treatments   Labs (all labs ordered are listed, but only abnormal results are displayed) Labs Reviewed  LACTIC ACID, PLASMA - Abnormal; Notable for the following components:      Result Value   Lactic Acid, Venous 2.7 (*)    All other components within normal limits  COMPREHENSIVE METABOLIC PANEL - Abnormal; Notable for the following components:   Glucose, Bld 156 (*)    BUN 35 (*)    Creatinine, Ser 1.61 (*)    Calcium 10.5 (*)    GFR, Estimated 31 (*)    All other components within normal limits  CBC WITH DIFFERENTIAL/PLATELET - Abnormal; Notable for the following components:   Hemoglobin 15.3 (*)    HCT 46.3 (*)    All other components within normal limits  TROPONIN I (HIGH SENSITIVITY) - Abnormal; Notable for the following components:   Troponin I (High Sensitivity) 23 (*)  All other components within normal limits  RESP PANEL BY RT-PCR (FLU A&B, COVID) [id number]  CULTURE, BLOOD (ROUTINE X 2)  CULTURE, BLOOD (ROUTINE X 2)  URINE CULTURE  GASTROINTESTINAL PANEL BY PCR, STOOL (REPLACES STOOL CULTURE)  C DIFFICILE QUICK SCREEN W PCR REFLEX    PROTIME-INR  APTT  URINALYSIS, COMPLETE (UACMP) WITH MICROSCOPIC  LACTIC ACID, PLASMA  PROCALCITONIN  TROPONIN I (HIGH SENSITIVITY)     EKG  EKG read interpreted by me shows normal sinus rhythm rate of 64 normal axis compared to EKG from 1 5 of this year she has flipped T waves inferiorly and in V5 and V6.  She also has some PVCs.   RADIOLOGY Chest x-ray read by radiology reviewed by me is negative CT of the head and neck read by radiology reviewed by me shows no acute disease  PROCEDURES:  Critical Care performed: Critical care time half an hour.  This includes reviewing the patient's old records and studies as well as her new studies and labs..  Following her blood pressure and adding more lactated  Ringer's.  Procedures   MEDICATIONS ORDERED IN ED: Medications  lactated ringers infusion (has no administration in time range)  lactated ringers bolus 1,000 mL (1,000 mLs Intravenous New Bag/Given 11/15/21 0027)  ceFEPIme (MAXIPIME) 2 g in sodium chloride 0.9 % 100 mL IVPB (2 g Intravenous New Bag/Given 11/15/21 0027)  metroNIDAZOLE (FLAGYL) IVPB 500 mg (has no administration in time range)  vancomycin (VANCOCIN) IVPB 1000 mg/200 mL premix (has no administration in time range)  lactated ringers bolus 1,000 mL (1,000 mLs Intravenous New Bag/Given 11/15/21 0027)     IMPRESSION / MDM / ASSESSMENT AND PLAN / ED COURSE  I reviewed the triage vital signs and the nursing notes. Patient does not have an elevated white count or fever but she was extremely hypotensive and had an episode of diarrhea.  She is somewhat groggy now.  Her GFR has gone down drastically since the beginning of the month.  Her troponin is slightly elevated but EKG does not show any acute changes.  Lactic acid is elevated.  She could possibly be septic I will treat her as such.  Blood pressure is coming up slowly.  I cannot find the source of her infection at this point.  She has not had enough fluid apparently to urinate yet.  We will continue giving her fluids carefully so we do not flood her. Additionally if she has a stool sent back to the lab to see if she has any infectious diarrhea.  The patient is on the cardiac monitor to evaluate for evidence of arrhythmia and/or significant heart rate changes.  None have been seen. ----------------------------------------- 1:02 AM on 11/15/2021 -----------------------------------------  Discussed the patient with hospitalist.  We will admit the patient.       FINAL CLINICAL IMPRESSION(S) / ED DIAGNOSES   Final diagnoses:  Diarrhea, unspecified type  Syncope and collapse  Hypotension, unspecified hypotension type  Elevated lactic acid level  AKI (acute kidney injury) (HCC)      Rx / DC Orders   ED Discharge Orders     None        Note:  This document was prepared using Dragon voice recognition software and may include unintentional dictation errors.   Arnaldo Natal, MD 11/15/21 0013    Arnaldo Natal, MD 11/15/21 0100    Arnaldo Natal, MD 11/15/21 [phone]

## 2021-11-15 ENCOUNTER — Encounter: Payer: Self-pay | Admitting: Internal Medicine

## 2021-11-15 DIAGNOSIS — A419 Sepsis, unspecified organism: Secondary | ICD-10-CM | POA: Diagnosis present

## 2021-11-15 DIAGNOSIS — R778 Other specified abnormalities of plasma proteins: Secondary | ICD-10-CM

## 2021-11-15 DIAGNOSIS — R55 Syncope and collapse: Secondary | ICD-10-CM | POA: Diagnosis not present

## 2021-11-15 DIAGNOSIS — R197 Diarrhea, unspecified: Secondary | ICD-10-CM | POA: Diagnosis present

## 2021-11-15 DIAGNOSIS — N179 Acute kidney failure, unspecified: Secondary | ICD-10-CM | POA: Diagnosis present

## 2021-11-15 DIAGNOSIS — R6520 Severe sepsis without septic shock: Secondary | ICD-10-CM | POA: Diagnosis present

## 2021-11-15 DIAGNOSIS — E038 Other specified hypothyroidism: Secondary | ICD-10-CM

## 2021-11-15 LAB — URINALYSIS, COMPLETE (UACMP) WITH MICROSCOPIC
Bilirubin Urine: NEGATIVE
Glucose, UA: NEGATIVE mg/dL
Ketones, ur: NEGATIVE mg/dL
Nitrite: NEGATIVE
Protein, ur: NEGATIVE mg/dL
Specific Gravity, Urine: 1.016 (ref 1.005–1.030)
pH: 5 (ref 5.0–8.0)

## 2021-11-15 LAB — RESP PANEL BY RT-PCR (FLU A&B, COVID) ARPGX2
Influenza A by PCR: NEGATIVE
Influenza B by PCR: NEGATIVE
SARS Coronavirus 2 by RT PCR: NEGATIVE

## 2021-11-15 LAB — CBG MONITORING, ED
Glucose-Capillary: 99 mg/dL (ref 70–99)
Glucose-Capillary: 102 mg/dL — ABNORMAL HIGH (ref 70–99)
Glucose-Capillary: 89 mg/dL (ref 70–99)

## 2021-11-15 LAB — SAMPLE TO BLOOD BANK

## 2021-11-15 LAB — TSH: TSH: 0.673 u[IU]/mL (ref 0.350–4.500)

## 2021-11-15 LAB — CBC
HCT: 38.6 % (ref 36.0–46.0)
Hemoglobin: 12.8 g/dL (ref 12.0–15.0)
MCH: 30.4 pg (ref 26.0–34.0)
MCHC: 33.2 g/dL (ref 30.0–36.0)
MCV: 91.7 fL (ref 80.0–100.0)
Platelets: 237 10*3/uL (ref 150–400)
RBC: 4.21 MIL/uL (ref 3.87–5.11)
RDW: 14.3 % (ref 11.5–15.5)
WBC: 8.5 10*3/uL (ref 4.0–10.5)
nRBC: 0 % (ref 0.0–0.2)

## 2021-11-15 LAB — PROCALCITONIN
Procalcitonin: <0.1 ng/mL
Procalcitonin: <0.1 ng/mL

## 2021-11-15 LAB — CREATININE, SERUM
Creatinine, Ser: 1.65 mg/dL — ABNORMAL HIGH (ref 0.44–1.00)
GFR, Estimated: 30 mL/min — ABNORMAL LOW (ref >60)

## 2021-11-15 LAB — CORTISOL-AM, BLOOD: Cortisol - AM: 3.3 ug/dL — ABNORMAL LOW (ref 6.7–22.6)

## 2021-11-15 LAB — PROTIME-INR
INR: 1 (ref 0.8–1.2)
Prothrombin Time: 13.6 seconds (ref 11.4–15.2)

## 2021-11-15 LAB — LACTIC ACID, PLASMA: Lactic Acid, Venous: 2.4 mmol/L (ref 0.5–1.9)

## 2021-11-15 LAB — TROPONIN I (HIGH SENSITIVITY): Troponin I (High Sensitivity): 22 ng/L — ABNORMAL HIGH (ref <18)

## 2021-11-15 MED ORDER — VANCOMYCIN HCL IN DEXTROSE 1-5 GM/200ML-% IV SOLN: 1000.0000 mg | INTRAVENOUS | Status: DC

## 2021-11-15 MED ORDER — METOPROLOL SUCCINATE ER 25 MG PO TB24
12.5000 mg | ORAL_TABLET | Freq: Every day | ORAL | Status: DC
  Administered 2021-11-15 – 2021-11-16 (×2): 12.5 mg via ORAL
  Filled 2021-11-15 (×2): qty 1

## 2021-11-15 MED ORDER — HEPARIN SODIUM (PORCINE) 5000 UNIT/ML IJ SOLN
5000.0000 [IU] | Freq: Three times a day (TID) | INTRAMUSCULAR | Status: DC
  Administered 2021-11-15 – 2021-11-16 (×5): 5000 [IU] via SUBCUTANEOUS
  Filled 2021-11-15 (×5): qty 1

## 2021-11-15 MED ORDER — SODIUM CHLORIDE 0.9 % IV SOLN: INTRAVENOUS | Status: AC

## 2021-11-15 MED ORDER — INSULIN ASPART 100 UNIT/ML IJ SOLN
0.0000 [IU] | Freq: Three times a day (TID) | INTRAMUSCULAR | Status: DC
  Administered 2021-11-16: 13:00:00 1 [IU] via SUBCUTANEOUS
  Filled 2021-11-15: qty 1

## 2021-11-15 MED ORDER — ALBUTEROL SULFATE (2.5 MG/3ML) 0.083% IN NEBU: 2.5000 mg | INHALATION_SOLUTION | Freq: Four times a day (QID) | RESPIRATORY_TRACT | Status: DC | PRN

## 2021-11-15 MED ORDER — MOMETASONE FURO-FORMOTEROL FUM 200-5 MCG/ACT IN AERO
2.0000 | INHALATION_SPRAY | Freq: Two times a day (BID) | RESPIRATORY_TRACT | Status: DC
  Filled 2021-11-15: qty 8.8

## 2021-11-15 MED ORDER — VANCOMYCIN VARIABLE DOSE PER UNSTABLE RENAL FUNCTION (PHARMACIST DOSING): Status: DC

## 2021-11-15 MED ORDER — FLUTICASONE PROPIONATE 50 MCG/ACT NA SUSP
1.0000 | Freq: Two times a day (BID) | NASAL | Status: DC | PRN
  Filled 2021-11-15: qty 16

## 2021-11-15 MED ORDER — LEVOTHYROXINE SODIUM 112 MCG PO TABS
112.0000 ug | ORAL_TABLET | Freq: Every day | ORAL | Status: DC
  Administered 2021-11-15 – 2021-11-16 (×2): 112 ug via ORAL
  Filled 2021-11-15 (×2): qty 1

## 2021-11-15 MED ORDER — VANCOMYCIN HCL 750 MG/150ML IV SOLN
750.0000 mg | Freq: Once | INTRAVENOUS | Status: AC
  Administered 2021-11-15: 750 mg via INTRAVENOUS
  Filled 2021-11-15: qty 150

## 2021-11-15 MED ORDER — METOPROLOL SUCCINATE ER 25 MG PO TB24
12.5000 mg | ORAL_TABLET | Freq: Every day | ORAL | Status: DC
  Filled 2021-11-15: qty 0.5

## 2021-11-15 MED ORDER — METRONIDAZOLE 500 MG/100ML IV SOLN
500.0000 mg | Freq: Two times a day (BID) | INTRAVENOUS | Status: DC
  Administered 2021-11-15: 500 mg via INTRAVENOUS
  Filled 2021-11-15 (×3): qty 100

## 2021-11-15 MED ORDER — CLOPIDOGREL BISULFATE 75 MG PO TABS
75.0000 mg | ORAL_TABLET | Freq: Every day | ORAL | Status: DC
  Administered 2021-11-15 – 2021-11-16 (×2): 75 mg via ORAL
  Filled 2021-11-15 (×2): qty 1

## 2021-11-15 MED ORDER — SODIUM CHLORIDE 0.9 % IV SOLN
2.0000 g | INTRAVENOUS | Status: DC
  Filled 2021-11-15: qty 2

## 2021-11-15 NOTE — ED Notes (Signed)
ED TO INPATIENT HANDOFF REPORT  ED Nurse Name and Phone #:   S Name/Age/Gender Andrea Phillips 87 y.o. female Room/Bed: ED14A/ED14A  Code Status   Code Status: Full Code  Home/SNF/Other Home Patient oriented to: self, place, time, and situation Is this baseline? Yes   Triage Complete: Triage complete  Chief Complaint Syncope and collapse [R55]  Triage Note Pt to ED via [facility] from home with c/o fall, per EMS pt fell between closet and bed, had an episode of diarrhea incontinence at home. Per EMS pt started new BP med with last admission to hospital, initial BP on scene 56/34, was able to increase BP to 61/34 with 200 cc's IVF, however lost IV access en route and unable to start another IV. Pt presents to ED alert and oriented, able to to answer all questions at this time.    Allergies Allergies  Allergen Reactions   Aspirin Hives   Other Hives    Yellow dye 6 FOOD COLOR YELLOW POWDER    Atorvastatin Other (See Comments)    unknown   Codeine Other (See Comments)    Doesn't know   Conj Estrog-Medroxyprogest Ace Other (See Comments)    PREMPRO 0.3-1.5 MG ORAL TABLET unknown   Prednisone Other (See Comments)    Chest pain   Raloxifene Other (See Comments)    EVISTA 60 MG ORAL TABLET unknown   Ace Inhibitors Cough   Amlodipine Itching   Valsartan Itching    Level of Care/Admitting Diagnosis ED Disposition     ED Disposition  Admit   Condition  --   [place]: [place] [[id number]]  Level of Care: Progressive [102]  Admit to Progressive based on following criteria: Other see comments  Comments: syncope  Covid Evaluation: Asymptomatic Screening Protocol (No Symptoms)  Diagnosis: Syncope and collapse [780.2.ICD-9-CM]  Admitting Physician: Cherylann Ratel  Attending Physician: Cherylann Ratel  Estimated length of stay: 3 - 4 days  Certification:: I certify this patient will need inpatient services for at  least 2 midnights          B Medical/Surgery History Past Medical History:  Diagnosis Date   Arthritis    ra   Asthma    CHF (congestive heart failure) ([place])    Dysrhythmia    Edema extremities    GERD (gastroesophageal reflux disease)    Gout    Headache    History of hiatal hernia    Hypertension    Hypothyroidism    Shortness of breath dyspnea    Sleep apnea    Past Surgical History:  Procedure Laterality Date   APPENDECTOMY     BACK SURGERY     CARDIAC CATHETERIZATION     CATARACT EXTRACTION W/PHACO Left 03/09/2015   Procedure: CATARACT EXTRACTION PHACO AND INTRAOCULAR LENS PLACEMENT ([place]);  Surgeon: Estill Cotta, MD;  Location: [facility];  Service: Ophthalmology;  Laterality: Left;  [place] 01:31 AP% 26.1 CDE 43.72   CATARACT EXTRACTION W/PHACO Right 08/19/2021   Procedure: CATARACT EXTRACTION PHACO AND INTRAOCULAR LENS PLACEMENT ([place]) RIGHT;  Surgeon: Birder Robson, MD;  Location: [facility];  Service: Ophthalmology;  Laterality: Right;  12.31 1:07.0   CATARACT EXTRACTION W/PHACO Right 09/07/2021   Procedure: REMOVAL OF LENS FRAGMENTS RIGHT;  Surgeon: Birder Robson, MD;  Location: [place];  Service: Ophthalmology;  Laterality: Right;   CHOLECYSTECTOMY     CORONARY/GRAFT ACUTE MI REVASCULARIZATION N/A 10/24/2021   Procedure: Coronary/Graft Acute MI Revascularization;  Surgeon:  End, Harrell Gave, MD;  Location: [place] [facility];  Service: Cardiovascular;  Laterality: N/A;   EYE SURGERY     retina   JOINT REPLACEMENT     left knee   LEFT HEART CATH AND CORONARY ANGIOGRAPHY N/A 04/02/2021   Procedure: LEFT HEART CATH AND CORONARY ANGIOGRAPHY;  Surgeon: Corey Skains, MD;  Location: [place] [facility];  Service: Cardiovascular;  Laterality: N/A;   LEFT HEART CATH AND CORONARY ANGIOGRAPHY N/A 10/24/2021   Procedure: LEFT HEART CATH AND CORONARY ANGIOGRAPHY;  Surgeon: Nelva Bush, MD;  Location: [place] [facility];  Service: Cardiovascular;   Laterality: N/A;     A IV Location/Drains/Wounds Patient Lines/Drains/Airways Status     Active Line/Drains/Airways     Name Placement date Placement time Site Days   Peripheral IV 11/14/21 22 G Anterior;Right Forearm 11/14/21  2245  Forearm  1   Peripheral IV 11/14/21 22 G Anterior;Right Wrist 11/14/21  2245  Wrist  1   External Urinary Catheter 11/15/21  0135  --  less than 1   Incision (Closed) 08/19/21 Eye Right 08/19/21  1053  -- 88   Incision (Closed) 09/07/21 Eye Right 09/07/21  1231  -- 69            Intake/Output Last 24 hours  Intake/Output Summary (Last 24 hours) at 11/15/2021 1924 Last data filed at 11/15/2021 0451 Gross per 24 hour  Intake 2542.45 ml  Output --  Net [id number] ml    Labs/Imaging Results for orders placed or performed during the hospital encounter of 11/14/21 (from the past 48 hour(s))  Resp Panel by RT-PCR (Flu A&B, Covid) Nasopharyngeal Swab     Status: None   Collection Time: 11/14/21 12:28 AM   Specimen: Nasopharyngeal Swab; Nasopharyngeal(NP) swabs in vial transport medium  Result Value Ref Range   SARS Coronavirus 2 by RT PCR NEGATIVE NEGATIVE    Comment: (NOTE) SARS-CoV-2 target nucleic acids are NOT DETECTED.  The SARS-CoV-2 RNA is generally detectable in upper respiratory specimens during the acute phase of infection. The lowest concentration of SARS-CoV-2 viral copies this assay can detect is 138 copies/mL. A negative result does not preclude SARS-Cov-2 infection and should not be used as the sole basis for treatment or other patient management decisions. A negative result may occur with  improper specimen collection/handling, submission of specimen other than nasopharyngeal swab, presence of viral mutation(s) within the areas targeted by this assay, and inadequate number of viral copies(<138 copies/mL). A negative result must be combined with clinical observations, patient history, and epidemiological information. The expected  result is Negative.  Fact Sheet for Patients:  EntrepreneurPulse.com.au  Fact Sheet for Healthcare Providers:  IncredibleEmployment.be  This test is no t yet approved or cleared by the [place] FDA and  has been authorized for detection and/or diagnosis of SARS-CoV-2 by FDA under an Emergency Use Authorization (EUA). This EUA will remain  in effect (meaning this test can be used) for the duration of the COVID-19 declaration under Section 564(b)(1) of the Act, 21 U.S.C.section 360bbb-3(b)(1), unless the authorization is terminated  or revoked sooner.       Influenza A by PCR NEGATIVE NEGATIVE   Influenza B by PCR NEGATIVE NEGATIVE    Comment: (NOTE) The Xpert Xpress SARS-CoV-2/FLU/RSV plus assay is intended as an aid in the diagnosis of influenza from Nasopharyngeal swab specimens and should not be used as a sole basis for treatment. Nasal washings and aspirates are unacceptable for Xpert Xpress SARS-CoV-2/FLU/RSV testing.  Fact  Sheet for Patients: EntrepreneurPulse.com.au  Fact Sheet for Healthcare Providers: IncredibleEmployment.be  This test is not yet approved or cleared by the [place] FDA and has been authorized for detection and/or diagnosis of SARS-CoV-2 by FDA under an Emergency Use Authorization (EUA). This EUA will remain in effect (meaning this test can be used) for the duration of the COVID-19 declaration under Section 564(b)(1) of the Act, 21 U.S.C. section 360bbb-3(b)(1), unless the authorization is terminated or revoked.  Performed at [facility], [address] [place], [place] [address]   Lactic acid, plasma     Status: Abnormal   Collection Time: 11/14/21 10:32 PM  Result Value Ref Range   Lactic Acid, Venous 2.7 (HH) 0.5 - 1.9 mmol/L    Comment: CRITICAL RESULT CALLED TO, READ BACK BY AND VERIFIED WITH ELIZABETH SHELL AT 2321 11/14/2021 DLB Performed at  [facility], [address] [place], [place] [address]   Comprehensive metabolic panel     Status: Abnormal   Collection Time: 11/14/21 10:32 PM  Result Value Ref Range   Sodium 139 135 - 145 mmol/L   Potassium 4.3 3.5 - 5.1 mmol/L   Chloride 104 98 - 111 mmol/L   CO2 25 22 - 32 mmol/L   Glucose, Bld 156 (H) 70 - 99 mg/dL    Comment: Glucose reference range applies only to samples taken after fasting for at least 8 hours.   BUN 35 (H) 8 - 23 mg/dL   Creatinine, Ser 1.61 (H) 0.44 - 1.00 mg/dL   Calcium 10.5 (H) 8.9 - 10.3 mg/dL   Total Protein 6.8 6.5 - 8.1 g/dL   Albumin 3.6 3.5 - 5.0 g/dL   AST 20 15 - 41 U/L   ALT 13 0 - 44 U/L   Alkaline Phosphatase 50 38 - 126 U/L   Total Bilirubin 1.1 0.3 - 1.2 mg/dL   GFR, Estimated 31 (L) >60 mL/min    Comment: (NOTE) Calculated using the CKD-EPI Creatinine Equation (2021)    Anion gap 10 5 - 15    Comment: Performed at [facility], [address] [place], [place] [address]  CBC WITH DIFFERENTIAL     Status: Abnormal   Collection Time: 11/14/21 10:32 PM  Result Value Ref Range   WBC 8.2 4.0 - 10.5 K/uL   RBC 5.01 3.87 - 5.11 MIL/uL   Hemoglobin 15.3 (H) 12.0 - 15.0 g/dL   HCT 46.3 (H) 36.0 - 46.0 %   MCV 92.4 80.0 - 100.0 fL   MCH 30.5 26.0 - 34.0 pg   MCHC 33.0 30.0 - 36.0 g/dL   RDW 14.6 11.5 - 15.5 %   Platelets 317 150 - 400 K/uL   nRBC 0.0 0.0 - 0.2 %   Neutrophils Relative % 58 %   Neutro Abs 4.8 1.7 - 7.7 K/uL   Lymphocytes Relative 32 %   Lymphs Abs 2.6 0.7 - 4.0 K/uL   Monocytes Relative 7 %   Monocytes Absolute 0.6 0.1 - 1.0 K/uL   Eosinophils Relative 2 %   Eosinophils Absolute 0.2 0.0 - 0.5 K/uL   Basophils Relative 1 %   Basophils Absolute 0.1 0.0 - 0.1 K/uL   Immature Granulocytes 0 %   Abs Immature Granulocytes 0.02 0.00 - 0.07 K/uL    Comment: Performed at [facility], [address] [place], [place] [address]  Protime-INR     Status: None   Collection Time: 11/14/21 10:32  PM  Result Value Ref Range  Prothrombin Time 13.2 11.4 - 15.2 seconds   INR 1.0 0.8 - 1.2    Comment: (NOTE) INR goal varies based on device and disease states. Performed at [facility], [address] [place], [place] [address]   APTT     Status: None   Collection Time: 11/14/21 10:32 PM  Result Value Ref Range   aPTT 33 24 - 36 seconds    Comment: Performed at [facility], [address] [place], [place] [address]  Blood Culture (routine x 2)     Status: None (Preliminary result)   Collection Time: 11/14/21 10:32 PM   Specimen: BLOOD  Result Value Ref Range   Specimen Description BLOOD RIGHT ANTECUBITAL    Special Requests      BOTTLES DRAWN AEROBIC AND ANAEROBIC Blood Culture adequate volume   Culture      NO GROWTH < 12 HOURS Performed at [facility], [address] [place], [place] [address]    Report Status PENDING   Blood Culture (routine x 2)     Status: None (Preliminary result)   Collection Time: 11/14/21 10:32 PM   Specimen: BLOOD  Result Value Ref Range   Specimen Description BLOOD BLOOD RIGHT HAND    Special Requests      BOTTLES DRAWN AEROBIC AND ANAEROBIC Blood Culture adequate volume   Culture      NO GROWTH < 12 HOURS Performed at [facility], [address] [place], [place] [address]    Report Status PENDING   Troponin I (High Sensitivity)     Status: Abnormal   Collection Time: 11/14/21 10:32 PM  Result Value Ref Range   Troponin I (High Sensitivity) 23 (H) <18 ng/L    Comment: (NOTE) Elevated high sensitivity troponin I (hsTnI) values and significant  changes across serial measurements may suggest ACS but many other  chronic and acute conditions are known to elevate hsTnI results.  Refer to the "Links" section for chest pain algorithms and additional  guidance. Performed at [facility], [address], [place], [place] [address]   Sample to Blood Bank     Status: None   Collection Time:  11/14/21 10:33 PM  Result Value Ref Range   Blood Bank Specimen SAMPLE AVAILABLE FOR TESTING    Sample Expiration      11/17/2021,2359 Performed at [facility], [place], [place] [address]   Troponin I (High Sensitivity)     Status: Abnormal   Collection Time: 11/15/21 12:28 AM  Result Value Ref Range   Troponin I (High Sensitivity) 22 (H) <18 ng/L    Comment: (NOTE) Elevated high sensitivity troponin I (hsTnI) values and significant  changes across serial measurements may suggest ACS but many other  chronic and acute conditions are known to elevate hsTnI results.  Refer to the "Links" section for chest pain algorithms and additional  guidance. Performed at [facility], [address] [place], [place] [address]   Lactic acid, plasma     Status: Abnormal   Collection Time: 11/15/21 12:28 AM  Result Value Ref Range   Lactic Acid, Venous 2.4 (HH) 0.5 - 1.9 mmol/L    Comment: CRITICAL VALUE NOTED. VALUE IS CONSISTENT WITH PREVIOUSLY REPORTED/CALLED VALUE DLB Performed at [facility], [address] [place], [place] [address]   Procalcitonin - Baseline     Status: None   Collection Time: 11/15/21 12:28 AM  Result Value Ref Range   Procalcitonin <0.10 ng/mL  Comment:        Interpretation: PCT (Procalcitonin) <= 0.5 ng/mL: Systemic infection (sepsis) is not likely. Local bacterial infection is possible. (NOTE)       Sepsis PCT Algorithm           Lower Respiratory Tract                                      Infection PCT Algorithm    ----------------------------     ----------------------------         PCT < 0.25 ng/mL                PCT < 0.10 ng/mL          Strongly encourage             Strongly discourage   discontinuation of antibiotics    initiation of antibiotics    ----------------------------     -----------------------------       PCT 0.25 - 0.50 ng/mL            PCT 0.10 - 0.25 ng/mL               OR       >80%  decrease in PCT            Discourage initiation of                                            antibiotics      Encourage discontinuation           of antibiotics    ----------------------------     -----------------------------         PCT >= 0.50 ng/mL              PCT 0.26 - 0.50 ng/mL               AND        <80% decrease in PCT             Encourage initiation of                                             antibiotics       Encourage continuation           of antibiotics    ----------------------------     -----------------------------        PCT >= 0.50 ng/mL                  PCT > 0.50 ng/mL               AND         increase in PCT                  Strongly encourage                                      initiation of antibiotics    Strongly encourage escalation           of antibiotics                                     -----------------------------  PCT <= 0.25 ng/mL                                                 OR                                        > 80% decrease in PCT                                      Discontinue / Do not initiate                                             antibiotics  Performed at [facility] [address] [place], [place] [address]   Urinalysis, Complete w Microscopic     Status: Abnormal   Collection Time: 11/15/21  5:00 AM  Result Value Ref Range   Color, Urine YELLOW (A) YELLOW   APPearance CLOUDY (A) CLEAR   Specific Gravity, Urine 1.016 1.005 - 1.030   pH 5.0 5.0 - 8.0   Glucose, UA NEGATIVE NEGATIVE mg/dL   Hgb urine dipstick MODERATE (A) NEGATIVE   Bilirubin Urine NEGATIVE NEGATIVE   Ketones, ur NEGATIVE NEGATIVE mg/dL   Protein, ur NEGATIVE NEGATIVE mg/dL   Nitrite NEGATIVE NEGATIVE   Leukocytes,Ua LARGE (A) NEGATIVE   RBC / HPF 11-20 0 - 5 RBC/hpf   WBC, UA 21-50 0 - 5 WBC/hpf   Bacteria, UA FEW (A) NONE SEEN   Squamous Epithelial / LPF 0-5 0 - 5   Mucus PRESENT     Hyaline Casts, UA PRESENT     Comment: Performed at [facility], [address] [place], [place] [address]  CBG monitoring, ED     Status: None   Collection Time: 11/15/21  6:38 AM  Result Value Ref Range   Glucose-Capillary 99 70 - 99 mg/dL    Comment: Glucose reference range applies only to samples taken after fasting for at least 8 hours.   Comment 1 Document in Chart   Protime-INR     Status: None   Collection Time: 11/15/21  6:39 AM  Result Value Ref Range   Prothrombin Time 13.6 11.4 - 15.2 seconds   INR 1.0 0.8 - 1.2    Comment: (NOTE) INR goal varies based on device and disease states. Performed at [facility], [address] [place], [place] [address]   Cortisol-am, blood     Status: Abnormal   Collection Time: 11/15/21  6:39 AM  Result Value Ref Range   Cortisol - AM 3.3 (L) 6.7 - 22.6 ug/dL    Comment: Performed at [facility] [address], [place], [place] [address]  Procalcitonin     Status: None   Collection Time: 11/15/21  6:39 AM  Result Value Ref Range   Procalcitonin <0.10 ng/mL    Comment:        Interpretation: PCT (Procalcitonin) <= 0.5 ng/mL: Systemic infection (sepsis) is not likely. Local bacterial infection is possible. (NOTE)       Sepsis PCT Algorithm  Lower Respiratory Tract                                      Infection PCT Algorithm    ----------------------------     ----------------------------         PCT < 0.25 ng/mL                PCT < 0.10 ng/mL          Strongly encourage             Strongly discourage   discontinuation of antibiotics    initiation of antibiotics    ----------------------------     -----------------------------       PCT 0.25 - 0.50 ng/mL            PCT 0.10 - 0.25 ng/mL               OR       >80% decrease in PCT            Discourage initiation of                                            antibiotics      Encourage discontinuation           of antibiotics     ----------------------------     -----------------------------         PCT >= 0.50 ng/mL              PCT 0.26 - 0.50 ng/mL               AND        <80% decrease in PCT             Encourage initiation of                                             antibiotics       Encourage continuation           of antibiotics    ----------------------------     -----------------------------        PCT >= 0.50 ng/mL                  PCT > 0.50 ng/mL               AND         increase in PCT                  Strongly encourage                                      initiation of antibiotics    Strongly encourage escalation           of antibiotics                                     -----------------------------  PCT <= 0.25 ng/mL                                                 OR                                        > 80% decrease in PCT                                      Discontinue / Do not initiate                                             antibiotics  Performed at [facility], [address] [place], [place] [address]   CBC     Status: None   Collection Time: 11/15/21  6:39 AM  Result Value Ref Range   WBC 8.5 4.0 - 10.5 K/uL   RBC 4.21 3.87 - 5.11 MIL/uL   Hemoglobin 12.8 12.0 - 15.0 g/dL   HCT 38.6 36.0 - 46.0 %   MCV 91.7 80.0 - 100.0 fL   MCH 30.4 26.0 - 34.0 pg   MCHC 33.2 30.0 - 36.0 g/dL   RDW 14.3 11.5 - 15.5 %   Platelets 237 150 - 400 K/uL   nRBC 0.0 0.0 - 0.2 %    Comment: Performed at [facility], [address] [place], [place] [address]  Creatinine, serum     Status: Abnormal   Collection Time: 11/15/21  7:55 AM  Result Value Ref Range   Creatinine, Ser 1.65 (H) 0.44 - 1.00 mg/dL   GFR, Estimated 30 (L) >60 mL/min    Comment: (NOTE) Calculated using the CKD-EPI Creatinine Equation (2021) Performed at [facility], [address] [place],  [address]   CBG monitoring, ED      Status: Abnormal   Collection Time: 11/15/21 11:12 AM  Result Value Ref Range   Glucose-Capillary 102 (H) 70 - 99 mg/dL    Comment: Glucose reference range applies only to samples taken after fasting for at least 8 hours.  CBG monitoring, ED     Status: None   Collection Time: 11/15/21  4:32 PM  Result Value Ref Range   Glucose-Capillary 89 70 - 99 mg/dL    Comment: Glucose reference range applies only to samples taken after fasting for at least 8 hours.   CT Head Wo Contrast  Result Date: 11/14/2021 CLINICAL DATA:  Head trauma, minor (Age >= 65y); Neck trauma (Age >= 65y). Fall. EXAM: CT HEAD WITHOUT CONTRAST CT CERVICAL SPINE WITHOUT CONTRAST TECHNIQUE: Multidetector CT imaging of the head and cervical spine was performed following the standard protocol without intravenous contrast. Multiplanar CT image reconstructions of the cervical spine were also generated. RADIATION DOSE REDUCTION: This exam was performed according to the departmental dose-optimization program which includes automated exposure control, adjustment of the mA and/or kV according to patient size and/or use of iterative reconstruction technique. COMPARISON:  None. FINDINGS: CT HEAD FINDINGS Brain: Normal anatomic configuration. Parenchymal volume loss is commensurate with the patient's age.  No abnormal intra or extra-axial mass lesion or fluid collection. No abnormal mass effect or midline shift. No evidence of acute intracranial hemorrhage or infarct. Ventricular size is normal. Cerebellum unremarkable. Vascular: No asymmetric hyperdense vasculature at the skull base. Skull: Intact Sinuses/Orbits: There is opacification of several right ethmoid air cells and mild mucosal thickening noted within the sphenoid sinuses, visualized maxillary sinuses, right frontal sinus. Remaining paranasal sinuses are clear. Ocular lenses have been removed. Remote right medial orbital wall fracture noted. Orbits are otherwise unremarkable. Other: Mastoid  air cells and middle ear cavities are clear. CT CERVICAL SPINE FINDINGS Alignment: Straightening of the cervical spine. Minimal retrolisthesis of C5 upon C6 is stable. Skull base and vertebrae: Grade cervical alignment is normal. Landau dental interval is not widened. Advanced degenerative changes are noted at the craniocervical junction. No acute fracture of the cervical spine Soft tissues and spinal canal: Posterior disc osteophyte complex at C2-3 results in mild central canal stenosis with flattening of the thecal sac. No prevertebral soft tissue swelling or paravertebral fluid collections are identified. No canal hematoma. Disc levels: There is intervertebral disc space narrowing and endplate remodeling throughout the cervical spine in keeping with changes of moderate to severe degenerative disc disease. Prevertebral soft tissues are not thickened. Multilevel uncovertebral arthrosis results in multilevel moderate neuroforaminal narrowing, most severe bilaterally at C3-4 on the right at C4-5 bilaterally at C5-6 and bilaterally at C6-7. Upper chest: Unremarkable Other: None IMPRESSION: No acute intracranial injury.  No calvarial fracture. No acute fracture or listhesis of the cervical spine. Electronically Signed   By: Fidela Salisbury M.D.   On: 11/14/2021 23:56   CT Cervical Spine Wo Contrast  Result Date: 11/14/2021 CLINICAL DATA:  Head trauma, minor (Age >= 65y); Neck trauma (Age >= 65y). Fall. EXAM: CT HEAD WITHOUT CONTRAST CT CERVICAL SPINE WITHOUT CONTRAST TECHNIQUE: Multidetector CT imaging of the head and cervical spine was performed following the standard protocol without intravenous contrast. Multiplanar CT image reconstructions of the cervical spine were also generated. RADIATION DOSE REDUCTION: This exam was performed according to the departmental dose-optimization program which includes automated exposure control, adjustment of the mA and/or kV according to patient size and/or use of iterative  reconstruction technique. COMPARISON:  None. FINDINGS: CT HEAD FINDINGS Brain: Normal anatomic configuration. Parenchymal volume loss is commensurate with the patient's age. No abnormal intra or extra-axial mass lesion or fluid collection. No abnormal mass effect or midline shift. No evidence of acute intracranial hemorrhage or infarct. Ventricular size is normal. Cerebellum unremarkable. Vascular: No asymmetric hyperdense vasculature at the skull base. Skull: Intact Sinuses/Orbits: There is opacification of several right ethmoid air cells and mild mucosal thickening noted within the sphenoid sinuses, visualized maxillary sinuses, right frontal sinus. Remaining paranasal sinuses are clear. Ocular lenses have been removed. Remote right medial orbital wall fracture noted. Orbits are otherwise unremarkable. Other: Mastoid air cells and middle ear cavities are clear. CT CERVICAL SPINE FINDINGS Alignment: Straightening of the cervical spine. Minimal retrolisthesis of C5 upon C6 is stable. Skull base and vertebrae: Grade cervical alignment is normal. Landau dental interval is not widened. Advanced degenerative changes are noted at the craniocervical junction. No acute fracture of the cervical spine Soft tissues and spinal canal: Posterior disc osteophyte complex at C2-3 results in mild central canal stenosis with flattening of the thecal sac. No prevertebral soft tissue swelling or paravertebral fluid collections are identified. No canal hematoma. Disc levels: There is intervertebral disc space narrowing and endplate remodeling throughout the cervical spine  in keeping with changes of moderate to severe degenerative disc disease. Prevertebral soft tissues are not thickened. Multilevel uncovertebral arthrosis results in multilevel moderate neuroforaminal narrowing, most severe bilaterally at C3-4 on the right at C4-5 bilaterally at C5-6 and bilaterally at C6-7. Upper chest: Unremarkable Other: None IMPRESSION: No acute  intracranial injury.  No calvarial fracture. No acute fracture or listhesis of the cervical spine. Electronically Signed   By: Fidela Salisbury M.D.   On: 11/14/2021 23:56   DG Chest Port 1 View  Result Date: 11/14/2021 CLINICAL DATA:  Fall EXAM: PORTABLE CHEST 1 VIEW COMPARISON:  10/24/2021, CT 10/25/2021, radiograph 08/29/2021 FINDINGS: No focal opacity or pleural effusion. Stable cardiomediastinal silhouette with aortic atherosclerosis. Streaky scarring or atelectasis at the left base. No pneumothorax. IMPRESSION: Minimal streaky atelectasis or scarring at the bases. No new airspace disease Electronically Signed   By: Donavan Foil M.D.   On: 11/14/2021 22:57    Pending Labs Unresulted Labs (From admission, onward)     Start     Ordered   11/16/21 1400  Vancomycin, random  Once-Timed,   STAT        11/15/21 1032   11/16/21 0500  Procalcitonin  Daily,   STAT      11/15/21 0048   11/14/21 2224  Gastrointestinal Panel by PCR , Stool  (Gastrointestinal Panel by PCR, Stool                                                                                                                                                     **Does Not include CLOSTRIDIUM DIFFICILE testing. **If CDIFF testing is needed, place order from the "C Difficile Testing" order set.**)  ONCE - STAT,   STAT        11/14/21 2223   11/14/21 2224  C Difficile Quick Screen w PCR reflex  (C Difficile quick screen w PCR reflex panel )  Once, for 24 hours,   STAT       References:    CDiff Information Tool   11/14/21 2223   11/14/21 2222  Urine Culture  (Septic presentation on arrival (screening labs, nursing and treatment orders for obvious sepsis))  ONCE - STAT,   STAT       Question:  Indication  Answer:  Sepsis   11/14/21 2222            Vitals/Pain Today's Vitals   11/15/21 1630 11/15/21 1700 11/15/21 1730 11/15/21 1800  BP: (!) 167/59 (!) 173/70 (!) 136/100 122/63  Pulse: 60 (!) 59 64 65  Resp: 17 16 13 16   SpO2: 97% 97%  97% 94%  Weight:      Height:      PainSc:        Isolation Precautions Enteric precautions (UV disinfection)  Medications Medications  levothyroxine (SYNTHROID) tablet 112 mcg (112  mcg Oral Given 11/15/21 0817)  clopidogrel (PLAVIX) tablet 75 mg (75 mg Oral Given 11/15/21 0816)  albuterol (PROVENTIL) (2.5 MG/3ML) 0.083% nebulizer solution 2.5 mg (has no administration in time range)  fluticasone (FLONASE) 50 MCG/ACT nasal spray 1 spray (has no administration in time range)  insulin aspart (novoLOG) injection 0-9 Units (0 Units Subcutaneous Not Given 11/15/21 1637)  heparin injection 5,000 Units (5,000 Units Subcutaneous Given 11/15/21 1641)  0.9 %  sodium chloride infusion ( Intravenous Rate/Dose Verify 11/15/21 1207)  ceFEPIme (MAXIPIME) 2 g in sodium chloride 0.9 % 100 mL IVPB (has no administration in time range)  mometasone-formoterol (DULERA) 200-5 MCG/ACT inhaler 2 puff (has no administration in time range)  vancomycin variable dose per unstable renal function (pharmacist dosing) (has no administration in time range)  metroNIDAZOLE (FLAGYL) IVPB 500 mg (0 mg Intravenous Stopped 11/15/21 1251)  metoprolol succinate (TOPROL-XL) 24 hr tablet 12.5 mg (0 mg Oral Hold 11/15/21 1512)  lactated ringers bolus 1,000 mL (0 mLs Intravenous Stopped 11/15/21 0142)  ceFEPIme (MAXIPIME) 2 g in sodium chloride 0.9 % 100 mL IVPB (0 g Intravenous Stopped 11/15/21 0142)  metroNIDAZOLE (FLAGYL) IVPB 500 mg (0 mg Intravenous Stopped 11/15/21 0325)  vancomycin (VANCOCIN) IVPB 1000 mg/200 mL premix (0 mg Intravenous Stopped 11/15/21 0325)  lactated ringers bolus 1,000 mL (0 mLs Intravenous Stopped 11/15/21 0142)  vancomycin (VANCOREADY) IVPB 750 mg/150 mL (0 mg Intravenous Stopped 11/15/21 0451)    Mobility walks with person assist High fall risk   Focused Assessments    R Recommendations: See Admitting Provider Note  Report given to:   Additional Notes:

## 2021-11-15 NOTE — H&P (Signed)
History and Physical    Andrea Phillips [id number] DOB: 1934/12/10 DOA: 11/14/2021  PCP: Perrin Maltese, MD    Patient coming from:  Home   Chief Complaint:  Syncope   HPI:  Andrea Phillips is a 87 y.o. female seen in ed with complaints of fall at home.  Patient earlier today while getting into bed and EMS evaluate patient patient's blood pressure was found to be 56 systolic.  Patient also was reported to have diarrhea prior to syncopal event.  Patient was said to have fallen between the closet and the bed and had stool incontinence initial blood pressure as above was 56/34 and was given IV fluids with any increase to 61/34 IV fluids not continued due to loss of IV access.  Patient was discharged earlier this month for NSTEMI with a follow-up appointment with Dr. Nehemiah Massed.  Pt has past medical history of allergies to multiple medications including aspirin, atorvastatin, codeine, prednisone, Prempro, raloxifene, ACE inhibitor's, amlodipine, valsartan.  Patient was past medical history of hypothyroidism, hyperlipidemia and hypertension. ED Course:   Vitals:   11/14/21 2230 11/14/21 2243 11/14/21 2300 11/15/21 0025  BP: (!) 98/44  (!) 104/54 117/62  Pulse: 60  65 (!) 59  Resp: (!) 29  11 17   SpO2: 95%  97% 96%  Weight:  74 kg    Height:  5' (1.524 m)     On arrival to the ED patient was lethargic hypotensive meeting sepsis criteria and code sepsis was called. Blood work shows a lactic acid of 2.7 serum creatinine of 1.6 calcium of 10.5-15.3 hematocrit of 46.3 and troponin of 23 respiratory cultures for COVID and flu are pending C. difficile was ordered and is pending cultures were obtained.  Initial EKG was sinus rhythm 64 with T wave inversions in V5 and V6 with PVCs.  CT head with no acute findings chest x-ray was no acute process.  In the emergency room patient was given LR bolus along with cefepime vancomycin and metronidazole for broad-spectrum coverage for sepsis.   Source is unclear blood and urine cultures obtained.  Procalcitonin is pending.  Troponin is 23.  Review of Systems:  Review of Systems  Constitutional:  Positive for malaise/fatigue.  Gastrointestinal:        Stool incontinence  Musculoskeletal:  Positive for falls.  Neurological:  Positive for dizziness and loss of consciousness.    Past Medical History:  Diagnosis Date   Arthritis    ra   Asthma    CHF (congestive heart failure) (HCC)    Dysrhythmia    Edema extremities    GERD (gastroesophageal reflux disease)    Gout    Headache    History of hiatal hernia    Hypertension    Hypothyroidism    Shortness of breath dyspnea    Sleep apnea     Past Surgical History:  Procedure Laterality Date   APPENDECTOMY     BACK SURGERY     CARDIAC CATHETERIZATION     CATARACT EXTRACTION W/PHACO Left 03/09/2015   Procedure: CATARACT EXTRACTION PHACO AND INTRAOCULAR LENS PLACEMENT ([place]);  Surgeon: Estill Cotta, MD;  Location: [facility];  Service: Ophthalmology;  Laterality: Left;  [place] 01:31 AP% 26.1 CDE 43.72   CATARACT EXTRACTION W/PHACO Right 08/19/2021   Procedure: CATARACT EXTRACTION PHACO AND INTRAOCULAR LENS PLACEMENT ([place]) RIGHT;  Surgeon: Birder Robson, MD;  Location: [facility];  Service: Ophthalmology;  Laterality: Right;  12.31 1:07.0   CATARACT EXTRACTION W/PHACO Right 09/07/2021  Procedure: REMOVAL OF LENS FRAGMENTS RIGHT;  Surgeon: Birder Robson, MD;  Location: [place];  Service: Ophthalmology;  Laterality: Right;   CHOLECYSTECTOMY     CORONARY/GRAFT ACUTE MI REVASCULARIZATION N/A 10/24/2021   Procedure: Coronary/Graft Acute MI Revascularization;  Surgeon: Nelva Bush, MD;  Location: [place] [facility];  Service: Cardiovascular;  Laterality: N/A;   EYE SURGERY     retina   JOINT REPLACEMENT     left knee   LEFT HEART CATH AND CORONARY ANGIOGRAPHY N/A 04/02/2021   Procedure: LEFT HEART CATH AND CORONARY ANGIOGRAPHY;  Surgeon: Corey Skains, MD;  Location: [place] [facility];  Service: Cardiovascular;  Laterality: N/A;   LEFT HEART CATH AND CORONARY ANGIOGRAPHY N/A 10/24/2021   Procedure: LEFT HEART CATH AND CORONARY ANGIOGRAPHY;  Surgeon: Nelva Bush, MD;  Location: [place] [facility];  Service: Cardiovascular;  Laterality: N/A;     reports that she has never smoked. She has never used smokeless tobacco. She reports that she does not drink alcohol and does not use drugs.  Allergies  Allergen Reactions   Aspirin Hives   Other Hives    Yellow dye 6 FOOD COLOR YELLOW POWDER    Atorvastatin Other (See Comments)    unknown   Codeine Other (See Comments)    Doesn't know   Conj Estrog-Medroxyprogest Ace Other (See Comments)    PREMPRO 0.3-1.5 MG ORAL TABLET unknown   Prednisone Other (See Comments)    Chest pain   Raloxifene Other (See Comments)    EVISTA 60 MG ORAL TABLET unknown   Ace Inhibitors Cough   Amlodipine Itching   Valsartan Itching    Family History  Problem Relation Age of Onset   Heart disease Mother    Heart disease Father    Breast cancer Cousin     Prior to Admission medications   Medication Sig Start Date End Date Taking? Authorizing Provider  albuterol (VENTOLIN HFA) 108 (90 Base) MCG/ACT inhaler Inhale 2 puffs into the lungs every 6 (six) hours as needed for wheezing or shortness of breath.    [provider]  cetirizine (ZYRTEC) 10 MG tablet Take 10 mg by mouth daily.    [provider]  clopidogrel (PLAVIX) 75 MG tablet Take 1 tablet (75 mg total) by mouth daily with breakfast. 10/28/21 11/27/21  Richarda Osmond, MD  fluticasone [facility]) 50 MCG/ACT nasal spray Place 1 spray into both nostrils 2 (two) times daily as needed for allergies or rhinitis.    [provider]  levothyroxine (SYNTHROID) 112 MCG tablet Take 112 mcg by mouth daily. 02/23/21   [provider]  meclizine (ANTIVERT) 25 MG tablet Take 25 mg by mouth 2 (two) times daily as needed  for dizziness. 07/05/21   [provider]  metoprolol succinate (TOPROL-XL) 25 MG 24 hr tablet Take 1 tablet (25 mg total) by mouth daily. 10/28/21 11/27/21  Richarda Osmond, MD  mometasone-formoterol (DULERA) 200-5 MCG/ACT AERO Inhale 2 puffs into the lungs 2 (two) times daily. 04/04/21   Mercy Riding, MD  pravastatin (PRAVACHOL) 20 MG tablet Take 1 tablet (20 mg total) by mouth daily at 6 PM. 10/27/21 11/26/21  Richarda Osmond, MD  telmisartan (MICARDIS) 40 MG tablet Take 80 mg by mouth daily. 03/06/21   [provider]    Physical Exam: Vitals:   11/14/21 2230 11/14/21 2243 11/14/21 2300 11/15/21 0025  BP: (!) 98/44  (!) 104/54 117/62  Pulse: 60  65 (!) 59  Resp: (!) 29  11 17   SpO2: 95%  97% 96%  Weight:  74 kg    Height:  5' (1.524 m)     Physical Exam Vitals reviewed.  Constitutional:      Appearance: She is not ill-appearing.  HENT:     Right Ear: External ear normal.     Left Ear: External ear normal.     Nose: Nose normal.     Mouth/Throat:     Mouth: Mucous membranes are dry.  Eyes:     Extraocular Movements: Extraocular movements intact.     Pupils: Pupils are equal, round, and reactive to light.  Neck:     Vascular: No carotid bruit.  Cardiovascular:     Rate and Rhythm: Normal rate and regular rhythm.     Pulses: Normal pulses.     Heart sounds: Normal heart sounds.  Pulmonary:     Effort: Pulmonary effort is normal.     Breath sounds: Normal breath sounds.  Abdominal:     General: Bowel sounds are normal. There is no distension.     Palpations: Abdomen is soft. There is no mass.     Tenderness: There is no abdominal tenderness. There is no guarding.     Hernia: No hernia is present.  Musculoskeletal:     Right lower leg: No edema.     Left lower leg: No edema.  Neurological:     General: No focal deficit present.     Mental Status: She is alert and oriented to person, place, and time.  Psychiatric:        Mood and Affect: Mood normal.         Behavior: Behavior normal.     Labs on Admission: I have personally reviewed following labs and imaging studies No results for input(s): CKTOTAL, CKMB, TROPONINI in the last 72 hours. Lab Results  Component Value Date   WBC 8.2 11/14/2021   HGB 15.3 (H) 11/14/2021   HCT 46.3 (H) 11/14/2021   MCV 92.4 11/14/2021   PLT 317 11/14/2021    Recent Labs  Lab 11/14/21 2232  NA 139  K 4.3  CL 104  CO2 25  BUN 35*  CREATININE 1.61*  CALCIUM 10.5*  PROT 6.8  BILITOT 1.1  ALKPHOS 50  ALT 13  AST 20  GLUCOSE 156*   Lab Results  Component Value Date   CHOL 185 10/25/2021   HDL 49 10/25/2021   LDLCALC 116 (H) 10/25/2021   TRIG 99 10/25/2021   Lab Results  Component Value Date   DDIMER 0.88 (H) 10/24/2021   Invalid input(s): POCBNP  COVID-19 Labs No results for input(s): DDIMER, FERRITIN, LDH, CRP in the last 72 hours. Lab Results  Component Value Date   SARSCOV2NAA NEGATIVE 10/24/2021   [place] NEGATIVE 08/29/2021   [place] NEGATIVE 04/01/2021   [place] NEGATIVE 04/07/2020   Radiological Exams on Admission: CT Head Wo Contrast  Result Date: 11/14/2021 CLINICAL DATA:  Head trauma, minor (Age >= 65y); Neck trauma (Age >= 65y). Fall. EXAM: CT HEAD WITHOUT CONTRAST CT CERVICAL SPINE WITHOUT CONTRAST TECHNIQUE: Multidetector CT imaging of the head and cervical spine was performed following the standard protocol without intravenous contrast. Multiplanar CT image reconstructions of the cervical spine were also generated. RADIATION DOSE REDUCTION: This exam was performed according to the departmental dose-optimization program which includes automated exposure control, adjustment of the mA and/or kV according to patient size and/or use of iterative reconstruction technique. COMPARISON:  None. FINDINGS: CT HEAD  FINDINGS Brain: Normal anatomic configuration. Parenchymal volume loss is commensurate with the patient's age. No abnormal intra or extra-axial mass lesion or  fluid collection. No abnormal mass effect or midline shift. No evidence of acute intracranial hemorrhage or infarct. Ventricular size is normal. Cerebellum unremarkable. Vascular: No asymmetric hyperdense vasculature at the skull base. Skull: Intact Sinuses/Orbits: There is opacification of several right ethmoid air cells and mild mucosal thickening noted within the sphenoid sinuses, visualized maxillary sinuses, right frontal sinus. Remaining paranasal sinuses are clear. Ocular lenses have been removed. Remote right medial orbital wall fracture noted. Orbits are otherwise unremarkable. Other: Mastoid air cells and middle ear cavities are clear. CT CERVICAL SPINE FINDINGS Alignment: Straightening of the cervical spine. Minimal retrolisthesis of C5 upon C6 is stable. Skull base and vertebrae: Grade cervical alignment is normal. Landau dental interval is not widened. Advanced degenerative changes are noted at the craniocervical junction. No acute fracture of the cervical spine Soft tissues and spinal canal: Posterior disc osteophyte complex at C2-3 results in mild central canal stenosis with flattening of the thecal sac. No prevertebral soft tissue swelling or paravertebral fluid collections are identified. No canal hematoma. Disc levels: There is intervertebral disc space narrowing and endplate remodeling throughout the cervical spine in keeping with changes of moderate to severe degenerative disc disease. Prevertebral soft tissues are not thickened. Multilevel uncovertebral arthrosis results in multilevel moderate neuroforaminal narrowing, most severe bilaterally at C3-4 on the right at C4-5 bilaterally at C5-6 and bilaterally at C6-7. Upper chest: Unremarkable Other: None IMPRESSION: No acute intracranial injury.  No calvarial fracture. No acute fracture or listhesis of the cervical spine. Electronically Signed   By: Fidela Salisbury M.D.   On: 11/14/2021 23:56   CT Cervical Spine Wo Contrast  Result Date:  11/14/2021 CLINICAL DATA:  Head trauma, minor (Age >= 65y); Neck trauma (Age >= 65y). Fall. EXAM: CT HEAD WITHOUT CONTRAST CT CERVICAL SPINE WITHOUT CONTRAST TECHNIQUE: Multidetector CT imaging of the head and cervical spine was performed following the standard protocol without intravenous contrast. Multiplanar CT image reconstructions of the cervical spine were also generated. RADIATION DOSE REDUCTION: This exam was performed according to the departmental dose-optimization program which includes automated exposure control, adjustment of the mA and/or kV according to patient size and/or use of iterative reconstruction technique. COMPARISON:  None. FINDINGS: CT HEAD FINDINGS Brain: Normal anatomic configuration. Parenchymal volume loss is commensurate with the patient's age. No abnormal intra or extra-axial mass lesion or fluid collection. No abnormal mass effect or midline shift. No evidence of acute intracranial hemorrhage or infarct. Ventricular size is normal. Cerebellum unremarkable. Vascular: No asymmetric hyperdense vasculature at the skull base. Skull: Intact Sinuses/Orbits: There is opacification of several right ethmoid air cells and mild mucosal thickening noted within the sphenoid sinuses, visualized maxillary sinuses, right frontal sinus. Remaining paranasal sinuses are clear. Ocular lenses have been removed. Remote right medial orbital wall fracture noted. Orbits are otherwise unremarkable. Other: Mastoid air cells and middle ear cavities are clear. CT CERVICAL SPINE FINDINGS Alignment: Straightening of the cervical spine. Minimal retrolisthesis of C5 upon C6 is stable. Skull base and vertebrae: Grade cervical alignment is normal. Landau dental interval is not widened. Advanced degenerative changes are noted at the craniocervical junction. No acute fracture of the cervical spine Soft tissues and spinal canal: Posterior disc osteophyte complex at C2-3 results in mild central canal stenosis with  flattening of the thecal sac. No prevertebral soft tissue swelling or paravertebral fluid collections are identified. No canal hematoma.  Disc levels: There is intervertebral disc space narrowing and endplate remodeling throughout the cervical spine in keeping with changes of moderate to severe degenerative disc disease. Prevertebral soft tissues are not thickened. Multilevel uncovertebral arthrosis results in multilevel moderate neuroforaminal narrowing, most severe bilaterally at C3-4 on the right at C4-5 bilaterally at C5-6 and bilaterally at C6-7. Upper chest: Unremarkable Other: None IMPRESSION: No acute intracranial injury.  No calvarial fracture. No acute fracture or listhesis of the cervical spine. Electronically Signed   By: Fidela Salisbury M.D.   On: 11/14/2021 23:56   DG Chest Port 1 View  Result Date: 11/14/2021 CLINICAL DATA:  Fall EXAM: PORTABLE CHEST 1 VIEW COMPARISON:  10/24/2021, CT 10/25/2021, radiograph 08/29/2021 FINDINGS: No focal opacity or pleural effusion. Stable cardiomediastinal silhouette with aortic atherosclerosis. Streaky scarring or atelectasis at the left base. No pneumothorax. IMPRESSION: Minimal streaky atelectasis or scarring at the bases. No new airspace disease Electronically Signed   By: Donavan Foil M.D.   On: 11/14/2021 22:57    EKG: Independently reviewed.  Sinus rhythm 64 with T wave inversions in V5 and V6 similar to his EKG and PVCs   Assessment/Plan: Principal Problem:   Syncope and collapse Active Problems:   Severe sepsis (HCC)   Essential hypertension   Hypothyroidism   NSTEMI (non-ST elevated myocardial infarction) (HCC)   Hyperlipidemia, mixed   AKI (acute kidney injury) ([place])   Diarrhea   Syncope / collapse : D/d include hypovolemia from diarrheal illness, cardiac etiology or infectious from sepsis. Fall precautions. IVF resuscitation. Hold home blood pressure medications. Additional supportive care.  Severe sepsis: Source unclear  currently, C. difficile pending. Patient continued on IV antibiotic regimen with pharmacy consult to manage and renally dose.  Lactic of 2.7 which we will repeat and follow . Antiemetics antipyretics as needed.  Hypertension: Blood pressure 131/65, pulse (!) 50, resp. rate 14, height 5' (1.524 m), weight 74 kg, SpO2 96 %. Due to initial hypotensive presentation patient's home regimen of blood pressure medication held including her beta-blocker and her ARB's.  NSTEMI/hyperlipidemia: Currently patient n.p.o. Troponin mildly elevated at 23 which I suspect is from sepsis presentation, could also be from acute kidney injury. We will however repeat troponin and follow.  AKI: Lab Results  Component Value Date   CREATININE 1.61 (H) 11/14/2021   CREATININE 0.74 10/26/2021   CREATININE 0.94 10/24/2021  Attribute to patient's hypovolemia.  We will have to hold blood pressure medications and patient's ARB's. Etiology of hypovolemia could be from diarrheal illness or cardiac etiology.  Diarrhea vs stool incontinence: Differentials include C. difficile related. Viral related. Provide supportive care. IV fluid maintenance continued.   DVT prophylaxis:  Heparin  Code Status:  Full code  Family Communication:  Clark,Charlotte (Sister)  [phone] (Mobile)   Disposition Plan:  Home  Consults called:  None  Admission status: Inpatient   Medical Decision Making   Coding    Para Skeans MD [facility]  6 PM- 2 AM. Please contact me via secure Chat 6 PM-2 AM. To contact the [facility] Attending or Consulting provider [place] or covering provider during after hours [place], for this patient.   Check the care team in [facility] and look for a) attending/consulting [facility] provider listed and b) the [facility] team listed Log into [email or web address] and use [place] universal password to access. If you do not have the password, please contact the hospital operator. Locate the [facility] provider you are  looking for under [facility] and  page to a number that you can be directly reached. If you still have difficulty reaching the provider, please page the [facility] (Director on Call) for the Hospitalists listed on amion for assistance. [email or web address] 11/15/2021, 1:04 AM

## 2021-11-15 NOTE — Progress Notes (Signed)
°  Progress Note   Patient: Andrea Phillips YHC:[id number] DOB: [birth year] DOA: 11/14/2021     0 DOS: the patient was seen and examined on 11/15/2021   Brief hospital course: No notes on file  Assessment and Plan * Syncope and collapse- (present on admission) Likely due to hypotension. BP improved while here.  Diarrhea- (present on admission) Transient and resolved. Stop Abx  Sepsis ruled out.  AKI (acute kidney injury) (HCC)- (present on admission) monitor with hydration.  Lab Results  Component Value Date   CREATININE 1.65 (H) 11/15/2021   CREATININE 1.61 (H) 11/14/2021   CREATININE 0.74 10/26/2021    Hypothyroidism- (present on admission) Continue levothyroxine, check TSH  Elevated troponin- (present on admission) Due to demand ischemia. No MI  Essential hypertension- (present on admission) Restarted toprol 12.5 mg once daily    Subjective: feeling much better now. Not dizzi. Sister at bedside  Objective Vital signs were reviewed and unremarkable.  Constitutional:      Appearance: She is not ill-appearing.  HENT:     Mouth: Mucous membranes are dry.  Eyes:     Extraocular Movements: Extraocular movements intact.     Pupils: Pupils are equal, round, and reactive to light.  Neck:     Vascular: No carotid bruit.  Cardiovascular:     Rate and Rhythm: Normal rate and regular rhythm.     Pulses: Normal pulses.     Heart sounds: Normal heart sounds.  Pulmonary:     Effort: Pulmonary effort is normal.     Breath sounds: Normal breath sounds.  Abdominal:     soft, bening Neurological:     General: No focal deficit present.     Mental Status: She is alert and oriented to person, place, and time.  Psychiatric:        Mood and Affect: Mood normal.        Behavior: Behavior normal.   Data Reviewed:  troponins and lactate up but trending down  Family Communication: sister updated at bedside  Disposition: Status is: Inpatient  Remains inpatient  appropriate because: syncope work up  DVT prophylaxis - SCD    Time spent: 35 minutes  Author: Delfino Lovett, MD 11/15/2021 10:27 PM  For on call review [email or web address].

## 2021-11-15 NOTE — Progress Notes (Signed)
Pharmacy Antibiotic Note  Andrea Phillips is a 87 y.o. female admitted on 11/14/2021 with sepsis.  Pharmacy has been consulted for Vancomycin ,  Cefepime dosing.  Plan: Cefepime 2 gm IV Q24H ordered to start on 1/23 @ 0000.  Vancomycin 1 gm IV X 1 given in ED on 1/23 @ 0149. Additional Vanc 750 mg IV X 1 ordered to make total loading dose of 1750 mg.   Vanc 1 gm IV Q48H ordered to start on 1/25 @ 0200.  AUC = 552.7 Vanc = 12.4  Height: 5' (152.4 cm) Weight: 74 kg (163 lb 2.3 oz) IBW/kg (Calculated) : 45.5  No data recorded.  Recent Labs  Lab 11/14/21 2232 11/15/21 0028  WBC 8.2  --   CREATININE 1.61*  --   LATICACIDVEN 2.7* 2.4*    Estimated Creatinine Clearance: 23.4 mL/min (A) (by C-G formula based on SCr of 1.61 mg/dL (H)).    Allergies  Allergen Reactions   Aspirin Hives   Other Hives    Yellow dye 6 FOOD COLOR YELLOW POWDER    Atorvastatin Other (See Comments)    unknown   Codeine Other (See Comments)    Doesn't know   Conj Estrog-Medroxyprogest Ace Other (See Comments)    PREMPRO 0.3-1.5 MG ORAL TABLET unknown   Prednisone Other (See Comments)    Chest pain   Raloxifene Other (See Comments)    EVISTA 60 MG ORAL TABLET unknown   Ace Inhibitors Cough   Amlodipine Itching   Valsartan Itching    Antimicrobials this admission:   >>    >>   Dose adjustments this admission:   Microbiology results:  BCx:   UCx:    Sputum:    MRSA PCR:   Thank you for allowing pharmacy to be a part of this patients care.  Jaeshaun Riva D 11/15/2021 2:19 AM

## 2021-11-15 NOTE — Assessment & Plan Note (Signed)
monitor with hydration.  Lab Results  Component Value Date   CREATININE 1.65 (H) 11/15/2021   CREATININE 1.61 (H) 11/14/2021   CREATININE 0.74 10/26/2021

## 2021-11-15 NOTE — ED Notes (Signed)
Hospitalist at the bedside 

## 2021-11-15 NOTE — ED Notes (Signed)
Informed RN of bed assignment ?

## 2021-11-15 NOTE — Progress Notes (Signed)
CODE SEPSIS - PHARMACY COMMUNICATION  **Broad Spectrum Antibiotics should be administered within 1 hour of Sepsis diagnosis**  Time Code Sepsis Called/Page Received: 1/22 @ 22:22  Antibiotics Ordered: Cefepime, Vancomycin   Time of 1st antibiotic administration: Cefepime 2 gm IV X 1 given on 1/23 @ 0027  Additional action taken by pharmacy:   If necessary, Name of Provider/Nurse Contacted:     Kona Yusuf D ,PharmD Clinical Pharmacist  11/15/2021  12:30 AM

## 2021-11-15 NOTE — Progress Notes (Signed)
Pharmacy Antibiotic Note  Andrea Phillips is a 87 y.o. female admitted on 11/14/2021 with sepsis.  Pharmacy has been consulted for Vancomycin ,  Cefepime dosing.  Plan: Continue cefepime 2 gm IV Q24H   Vancomycin 1750 mg loading dose given in the ED. Pt is currently in AKI will plan to dose by levels, until Scr returns to baseline. Plan to obtain a 36 hour level.   Height: 5' (152.4 cm) Weight: 74 kg (163 lb 2.3 oz) IBW/kg (Calculated) : 45.5  No data recorded.  Recent Labs  Lab 11/14/21 2232 11/15/21 0028 11/15/21 0639 11/15/21 0755  WBC 8.2  --  8.5  --   CREATININE 1.61*  --   --  1.65*  LATICACIDVEN 2.7* 2.4*  --   --      Estimated Creatinine Clearance: 22.8 mL/min (A) (by C-G formula based on SCr of 1.65 mg/dL (H)).    Allergies  Allergen Reactions   Aspirin Hives   Other Hives    Yellow dye 6 FOOD COLOR YELLOW POWDER    Atorvastatin Other (See Comments)    unknown   Codeine Other (See Comments)    Doesn't know   Conj Estrog-Medroxyprogest Ace Other (See Comments)    PREMPRO 0.3-1.5 MG ORAL TABLET unknown   Prednisone Other (See Comments)    Chest pain   Raloxifene Other (See Comments)    EVISTA 60 MG ORAL TABLET unknown   Ace Inhibitors Cough   Amlodipine Itching   Valsartan Itching    Antimicrobials this admission: 1/23 cefepime >>  1/23 vancomycin  >>   Microbiology results: 1/22 BCx: pending 1/23 UCx: pendign  Thank you for allowing pharmacy to be a part of this patients care.  Ronnald Ramp, PharmD, BCPS 11/15/2021 10:32 AM

## 2021-11-15 NOTE — ED Notes (Signed)
Pt given dinner tray at this time.  

## 2021-11-15 NOTE — Assessment & Plan Note (Signed)
Restarted toprol 12.5 mg once daily

## 2021-11-15 NOTE — Assessment & Plan Note (Signed)
Due to demand ischemia.  No MI 

## 2021-11-15 NOTE — Assessment & Plan Note (Signed)
Likely due to hypotension. BP improved while here.

## 2021-11-15 NOTE — ED Notes (Signed)
Anamaria Statz (grandson)  ** please text if pt gets a room assignment **  [phone]

## 2021-11-15 NOTE — Consult Note (Signed)
CARDIOLOGY CONSULT NOTE               Patient ID: DANNON PERLOW MRN: [id number] DOB/AGE: 85/02/1935 85 y.o.  Admit date: 11/14/2021 Referring Physician Dr Max Sane Primary Physician none Primary Cardiologist Nehemiah Massed Reason for Consultation syncope  HPI: The patient is an 88 year old female with a past medical history significant for CAD s/p cardiac cath 10/24/21 resulted with an occluded distal OM 3 (too small for PCI), 20% stenosed LAD, HFrEF (LVEF 35-40%), hyperlipidemia, frequent PVCs, hypertension, OSA on CPAP who presented to [facility] ED the evening of 1/22 after a syncopal episode.  The patient reports for 2 days prior to admission she was "just not feeling good" and spent most of the day resting in bed due to fatigue and a .  She said she ate some peanut butter crackers and some Ensure for those 2 days but was not feeling at her baseline. She did not take any of her medications except for her Synthroid. Yesterday evening she got out of bed because she had to pee, went to take "a few of her medications" (she is unsure which ones she took), then went to the restroom and had a very watery, loose bowel movement.  Afterwards, she went to her closet to change into a new nightgown and she reported feeling very dizzy and having another loose bowel movement before she passed out.  She remembers calling for her granddaughter to help get her up but they were unable to do so therefore EMS was called. Per EMS report the patient's blood pressure was 56/34.  In the ED she was given a bolus of LR and started on empiric cefepime, metronidazole, and vancomycin.  She reports she feels better since she has been in the ED, denies any more episodes of dizziness however she has not been out of bed since she has been in the ED.  She reports occasional cough and generalized fatigue, and some intermittent chest pressure that is fleeting. She denies fever, chills, or any more diarrhea since last night.    Vital signs on arrival are significant for a blood pressure of 98/44, heart rate 66.  Home her blood pressure has improved overnight between [id number]-893 systolic throughout the day off medications.   Labs on admission are significant for blood glucose of 156, creatinine 1.61, EGFR 31 (baseline 0.74, EGFR greater than 60).  Troponins flat 23-22.  Lactate trending 2.7-2.4, procalcitonin less than 10.  H&H 12.8/38.6, platelets 237.  Head CT and C-spine is negative for any acute injury.   Review of systems complete and found to be negative unless listed above     Past Medical History:  Diagnosis Date   Arthritis    ra   Asthma    CHF (congestive heart failure) (HCC)    Dysrhythmia    Edema extremities    GERD (gastroesophageal reflux disease)    Gout    Headache    History of hiatal hernia    Hypertension    Hypothyroidism    Shortness of breath dyspnea    Sleep apnea     Past Surgical History:  Procedure Laterality Date   APPENDECTOMY     BACK SURGERY     CARDIAC CATHETERIZATION     CATARACT EXTRACTION W/PHACO Left 03/09/2015   Procedure: CATARACT EXTRACTION PHACO AND INTRAOCULAR LENS PLACEMENT ([place]);  Surgeon: Estill Cotta, MD;  Location: [facility] ORS;  Service: Ophthalmology;  Laterality: Left;  [place] 01:31 AP% 26.1 CDE 43.72   CATARACT EXTRACTION  W/PHACO Right 08/19/2021   Procedure: CATARACT EXTRACTION PHACO AND INTRAOCULAR LENS PLACEMENT ([facility]) RIGHT;  Surgeon: Birder Robson, MD;  Location: [facility] OR[facility];  Service: Ophthalmology;  Laterality: Right;  12.31 1:07.0   CATARACT EXTRACTION W/PHACO Right 09/07/2021   Procedure: REMOVAL OF LENS FRAGMENTS RIGHT;  Surgeon: Birder Robson, MD;  Location: [place];  Service: Ophthalmology;  Laterality: Right;   CHOLECYSTECTOMY     CORONARY/GRAFT ACUTE MI REVASCULARIZATION N/A 10/24/2021   Procedure: Coronary/Graft Acute MI Revascularization;  Surgeon: Nelva Bush, MD;  Location: [place] [facility];  Service:  Cardiovascular;  Laterality: N/A;   EYE SURGERY     retina   JOINT REPLACEMENT     left knee   LEFT HEART CATH AND CORONARY ANGIOGRAPHY N/A 04/02/2021   Procedure: LEFT HEART CATH AND CORONARY ANGIOGRAPHY;  Surgeon: Corey Skains, MD;  Location: [place] [facility];  Service: Cardiovascular;  Laterality: N/A;   LEFT HEART CATH AND CORONARY ANGIOGRAPHY N/A 10/24/2021   Procedure: LEFT HEART CATH AND CORONARY ANGIOGRAPHY;  Surgeon: Nelva Bush, MD;  Location: [place] [facility];  Service: Cardiovascular;  Laterality: N/A;    (Not in a hospital admission)  Social History   Socioeconomic History   Marital status: Widowed    Spouse name: Not on file   Number of children: Not on file   Years of education: Not on file   Highest education level: Not on file  Occupational History   Not on file  Tobacco Use   Smoking status: Never   Smokeless tobacco: Never  Substance and Sexual Activity   Alcohol use: No   Drug use: No   Sexual activity: Not on file  Other Topics Concern   Not on file  Social History Narrative   Not on file   Social Determinants of Health   Financial Resource Strain: Not on file  Food Insecurity: Not on file  Transportation Needs: Not on file  Physical Activity: Not on file  Stress: Not on file  Social Connections: Not on file  Intimate Partner Violence: Not on file    Family History  Problem Relation Age of Onset   Heart disease Mother    Heart disease Father    Breast cancer Cousin       Review of systems complete and found to be negative unless listed above    PHYSICAL EXAM General: Pleasant elderly Caucasian female, well nourished, in no acute distress. Sitting at incline in ED bed with her sister at bedside. HEENT:  Normocephalic and atraumatic. Neck:  No JVD.  Lungs: Normal respiratory effort on room air. Clear bilaterally to auscultation. No wheezes, crackles, rhonchi.  Heart: HRRR with frequent extra beats. Normal S1 and S2 without  gallops or murmurs. Radial & DP pulses 2+ bilaterally. Abdomen: Non-distended appearing.  Msk: Normal strength and tone for age. Extremities: No clubbing, cyanosis or edema.   Neuro: Alert and oriented X 3. Psych:  Mood appropriate, affect congruent.   Labs:   Lab Results  Component Value Date   WBC 8.5 11/15/2021   HGB 12.8 11/15/2021   HCT 38.6 11/15/2021   MCV 91.7 11/15/2021   PLT 237 11/15/2021    Recent Labs  Lab 11/14/21 2232 11/15/21 0755  NA 139  --   K 4.3  --   CL 104  --   CO2 25  --   BUN 35*  --   CREATININE 1.61* 1.65*  CALCIUM 10.5*  --   PROT 6.8  --  BILITOT 1.1  --   ALKPHOS 50  --   ALT 13  --   AST 20  --   GLUCOSE 156*  --    Lab Results  Component Value Date   CKTOTAL 113 04/02/2021    Lab Results  Component Value Date   CHOL 185 10/25/2021   CHOL 170 04/02/2021   Lab Results  Component Value Date   HDL 49 10/25/2021   HDL 46 04/02/2021   Lab Results  Component Value Date   LDLCALC 116 (H) 10/25/2021   LDLCALC 112 (H) 04/02/2021   Lab Results  Component Value Date   TRIG 99 10/25/2021   TRIG 60 04/02/2021   Lab Results  Component Value Date   CHOLHDL 3.8 10/25/2021   CHOLHDL 3.7 04/02/2021   No results found for: LDLDIRECT    Radiology: DG Chest 2 View  Result Date: 10/24/2021 CLINICAL DATA:  87 year old female with history of shortness of breath and chest pain. EXAM: CHEST - 2 VIEW COMPARISON:  Chest x-ray 08/29/2021. FINDINGS: Lung volumes are normal. Persistent areas of peribronchial cuffing, interstitial prominence an ill-defined airspace disease throughout the mid to lower lungs bilaterally. No pleural effusions. No pneumothorax. No pulmonary nodule or mass noted. Pulmonary vasculature and the cardiomediastinal silhouette are within normal limits. Atherosclerosis in the thoracic aorta. IMPRESSION: 1. Persistent bilateral lower lobe bronchopneumonia, with slightly improved aeration in the right lower lobe compared to the  prior examination. Electronically Signed   By: Vinnie Langton M.D.   On: 10/24/2021 13:21   CT Head Wo Contrast  Result Date: 11/14/2021 CLINICAL DATA:  Head trauma, minor (Age >= 65y); Neck trauma (Age >= 65y). Fall. EXAM: CT HEAD WITHOUT CONTRAST CT CERVICAL SPINE WITHOUT CONTRAST TECHNIQUE: Multidetector CT imaging of the head and cervical spine was performed following the standard protocol without intravenous contrast. Multiplanar CT image reconstructions of the cervical spine were also generated. RADIATION DOSE REDUCTION: This exam was performed according to the departmental dose-optimization program which includes automated exposure control, adjustment of the mA and/or kV according to patient size and/or use of iterative reconstruction technique. COMPARISON:  None. FINDINGS: CT HEAD FINDINGS Brain: Normal anatomic configuration. Parenchymal volume loss is commensurate with the patient's age. No abnormal intra or extra-axial mass lesion or fluid collection. No abnormal mass effect or midline shift. No evidence of acute intracranial hemorrhage or infarct. Ventricular size is normal. Cerebellum unremarkable. Vascular: No asymmetric hyperdense vasculature at the skull base. Skull: Intact Sinuses/Orbits: There is opacification of several right ethmoid air cells and mild mucosal thickening noted within the sphenoid sinuses, visualized maxillary sinuses, right frontal sinus. Remaining paranasal sinuses are clear. Ocular lenses have been removed. Remote right medial orbital wall fracture noted. Orbits are otherwise unremarkable. Other: Mastoid air cells and middle ear cavities are clear. CT CERVICAL SPINE FINDINGS Alignment: Straightening of the cervical spine. Minimal retrolisthesis of C5 upon C6 is stable. Skull base and vertebrae: Grade cervical alignment is normal. Landau dental interval is not widened. Advanced degenerative changes are noted at the craniocervical junction. No acute fracture of the cervical  spine Soft tissues and spinal canal: Posterior disc osteophyte complex at C2-3 results in mild central canal stenosis with flattening of the thecal sac. No prevertebral soft tissue swelling or paravertebral fluid collections are identified. No canal hematoma. Disc levels: There is intervertebral disc space narrowing and endplate remodeling throughout the cervical spine in keeping with changes of moderate to severe degenerative disc disease. Prevertebral soft tissues are not thickened. Multilevel  uncovertebral arthrosis results in multilevel moderate neuroforaminal narrowing, most severe bilaterally at C3-4 on the right at C4-5 bilaterally at C5-6 and bilaterally at C6-7. Upper chest: Unremarkable Other: None IMPRESSION: No acute intracranial injury.  No calvarial fracture. No acute fracture or listhesis of the cervical spine. Electronically Signed   By: Fidela Salisbury M.D.   On: 11/14/2021 23:56   CT Cervical Spine Wo Contrast  Result Date: 11/14/2021 CLINICAL DATA:  Head trauma, minor (Age >= 65y); Neck trauma (Age >= 65y). Fall. EXAM: CT HEAD WITHOUT CONTRAST CT CERVICAL SPINE WITHOUT CONTRAST TECHNIQUE: Multidetector CT imaging of the head and cervical spine was performed following the standard protocol without intravenous contrast. Multiplanar CT image reconstructions of the cervical spine were also generated. RADIATION DOSE REDUCTION: This exam was performed according to the departmental dose-optimization program which includes automated exposure control, adjustment of the mA and/or kV according to patient size and/or use of iterative reconstruction technique. COMPARISON:  None. FINDINGS: CT HEAD FINDINGS Brain: Normal anatomic configuration. Parenchymal volume loss is commensurate with the patient's age. No abnormal intra or extra-axial mass lesion or fluid collection. No abnormal mass effect or midline shift. No evidence of acute intracranial hemorrhage or infarct. Ventricular size is normal. Cerebellum  unremarkable. Vascular: No asymmetric hyperdense vasculature at the skull base. Skull: Intact Sinuses/Orbits: There is opacification of several right ethmoid air cells and mild mucosal thickening noted within the sphenoid sinuses, visualized maxillary sinuses, right frontal sinus. Remaining paranasal sinuses are clear. Ocular lenses have been removed. Remote right medial orbital wall fracture noted. Orbits are otherwise unremarkable. Other: Mastoid air cells and middle ear cavities are clear. CT CERVICAL SPINE FINDINGS Alignment: Straightening of the cervical spine. Minimal retrolisthesis of C5 upon C6 is stable. Skull base and vertebrae: Grade cervical alignment is normal. Landau dental interval is not widened. Advanced degenerative changes are noted at the craniocervical junction. No acute fracture of the cervical spine Soft tissues and spinal canal: Posterior disc osteophyte complex at C2-3 results in mild central canal stenosis with flattening of the thecal sac. No prevertebral soft tissue swelling or paravertebral fluid collections are identified. No canal hematoma. Disc levels: There is intervertebral disc space narrowing and endplate remodeling throughout the cervical spine in keeping with changes of moderate to severe degenerative disc disease. Prevertebral soft tissues are not thickened. Multilevel uncovertebral arthrosis results in multilevel moderate neuroforaminal narrowing, most severe bilaterally at C3-4 on the right at C4-5 bilaterally at C5-6 and bilaterally at C6-7. Upper chest: Unremarkable Other: None IMPRESSION: No acute intracranial injury.  No calvarial fracture. No acute fracture or listhesis of the cervical spine. Electronically Signed   By: Fidela Salisbury M.D.   On: 11/14/2021 23:56   CARDIAC CATHETERIZATION  Result Date: 10/24/2021 Conclusions: Severe single-vessel coronary artery disease with occlusion of distal OM3 branch, which is too small/distal for intervention.  There is also mild  plaquing in the mid LAD with up to 20% stenosis. Moderately elevated left ventricular filling pressure (LVEDP 25 mmHg). Recommendation: Overnight observation in stepdown unit.  If patient has recurrent chest pain, recommend initiation of IV nitroglycerin and obtaining CTA chest to evaluate for dissection (very high BP on presentation) and pulmonary embolism (recent hospitalization for pneumonia). Continue clopidogrel 75 mg daily, given history of aspirin intolerance. Restart IV heparin in 2 hours; recommend completing 48 hours of heparin (if no contraindication develops) for medical management of NSTEMI. Follow-up echocardiogram. Aggressive secondary prevention of coronary artery disease.  Consider rechallenging with statin versus outpatient trial of  PCSK-9 inhibitor. Consider gentle diuresis. Nelva Bush, MD [facility]  DG Chest Port 1 View  Result Date: 11/14/2021 CLINICAL DATA:  Fall EXAM: PORTABLE CHEST 1 VIEW COMPARISON:  10/24/2021, CT 10/25/2021, radiograph 08/29/2021 FINDINGS: No focal opacity or pleural effusion. Stable cardiomediastinal silhouette with aortic atherosclerosis. Streaky scarring or atelectasis at the left base. No pneumothorax. IMPRESSION: Minimal streaky atelectasis or scarring at the bases. No new airspace disease Electronically Signed   By: Donavan Foil M.D.   On: 11/14/2021 22:57   CT ANGIO CHEST AORTA W/CM &/OR WO/CM  Result Date: 10/25/2021 CLINICAL DATA:  Evaluate for acute aortic syndrome. Chest pain radiating into right arm. EXAM: CT ANGIOGRAPHY CHEST WITH CONTRAST TECHNIQUE: Multidetector CT imaging of the chest was performed using the standard protocol during bolus administration of intravenous contrast. Multiplanar CT image reconstructions and MIPs were obtained to evaluate the vascular anatomy. CONTRAST:  8m OMNIPAQUE IOHEXOL 350 MG/ML SOLN COMPARISON:  10/11/2021 FINDINGS: Cardiovascular: No aortic intramural hemotoma. Preferential opacification of the thoracic  aorta. No evidence of thoracic aortic aneurysm or dissection. Normal heart size. No pericardial effusion. Aortic atherosclerosis and mild coronary artery calcifications. The main pulmonary artery appears patent. No signs of central obstructing pulmonary embolus. Mediastinum/Nodes: Thyroid gland appears atrophic or surgically absent. Trachea is patent and midline. Normal appearance of the esophagus. No enlarged axillary, supraclavicular, mediastinal, or hilar lymph nodes. Lungs/Pleura: No pleural effusion. Stable bandlike area of scarring within the posteromedial left lower lobe. Similar appearance of right lower lobe peripheral and basilar predominant areas of subsegmental atelectasis and subpleural consolidation. Findings compatible with postinflammatory or infectious process. Upper Abdomen: No acute abnormality. Aortic atherosclerosis. Hiatal hernia. Status post cholecystectomy. Unchanged appearance of left portal hepatic venous shunt within segment 3 of the liver, image 92/4. Musculoskeletal: Spondylosis identified within the thoracic spine. No acute or suspicious osseous findings. Review of the MIP images confirms the above findings. IMPRESSION: 1. No evidence for acute aortic syndrome. 2. Similar appearance of peripheral and basilar predominant areas of subsegmental atelectasis and subpleural consolidation with thickening of the peribronchovascular interstitium compatible with postinflammatory or infectious process. Suggest continued interval follow-up with repeat CT of the chest in 3 months to ensure complete resolution. 3. Aortic Atherosclerosis (ICD10-I70.0). Coronary artery calcifications. Electronically Signed   By: TKerby MoorsM.D.   On: 10/25/2021 08:26   ECHOCARDIOGRAM COMPLETE  Result Date: 10/25/2021    ECHOCARDIOGRAM REPORT   Patient Name:   DKIASHA BELLINDate of Exam: 10/25/2021 Medical Rec #:  [id number]          Height:       60.0 in Accession #:    [id number]         Weight:        154.2 lb Date of Birth:  3Jul 28, 1936          BSA:          1.671 m Patient Age:    89years            BP:           130/57 mmHg Patient Gender: F                   HR:           68 bpm. Exam Location:  [facility] Procedure: 2D Echo, Color Doppler, Cardiac Doppler and Intracardiac            Opacification Agent Indications:     R07.9 Chest Pain; Elevated  troponin  History:         Patient has prior history of Echocardiogram examinations, most                  recent 04/02/2021. CHF; Risk Factors:Hypertension and Sleep                  Apnea.  Sonographer:     Charmayne Sheer Referring Phys:  [id number] AMY N [facility] Diagnosing Phys: Donnelly Angelica  Sonographer Comments: Suboptimal apical window and no subcostal window. IMPRESSIONS  1. Left ventricular ejection fraction, by estimation, is 50 to 55%. The left ventricle has low normal function. The left ventricle has no regional wall motion abnormalities. Left ventricular diastolic parameters are consistent with Grade II diastolic dysfunction (pseudonormalization).  2. Right ventricular systolic function is normal. The right ventricular size is normal.  3. Left atrial size was mildly dilated.  4. The mitral valve is normal in structure. No evidence of mitral valve regurgitation. No evidence of mitral stenosis.  5. The aortic valve is grossly normal. Aortic valve regurgitation is not visualized. No aortic stenosis is present. FINDINGS  Left Ventricle: Left ventricular ejection fraction, by estimation, is 50 to 55%. The left ventricle has low normal function. The left ventricle has no regional wall motion abnormalities. Definity contrast agent was given IV to delineate the left ventricular endocardial borders. The left ventricular internal cavity size was normal in size. There is no left ventricular hypertrophy. Left ventricular diastolic parameters are consistent with Grade II diastolic dysfunction (pseudonormalization). Right Ventricle: The right ventricular size is normal. Right vetricular  wall thickness was not well visualized. Right ventricular systolic function is normal. Left Atrium: Left atrial size was mildly dilated. Right Atrium: Right atrial size was not well visualized. Pericardium: There is no evidence of pericardial effusion. Mitral Valve: The mitral valve is normal in structure. No evidence of mitral valve regurgitation. No evidence of mitral valve stenosis. MV peak gradient, 3.3 mmHg. The mean mitral valve gradient is 2.0 mmHg. Tricuspid Valve: The tricuspid valve is not well visualized. Tricuspid valve regurgitation is not demonstrated. Aortic Valve: The aortic valve is grossly normal. Aortic valve regurgitation is not visualized. No aortic stenosis is present. Aortic valve mean gradient measures 6.0 mmHg. Aortic valve peak gradient measures 10.1 mmHg. Aortic valve area, by VTI measures  1.53 cm. Pulmonic Valve: The pulmonic valve was not well visualized. Pulmonic valve regurgitation is not visualized. Aorta: The aortic root is normal in size and structure. Venous: The inferior vena cava was not well visualized. IAS/Shunts: The interatrial septum was not well visualized.  LEFT VENTRICLE PLAX 2D LVIDd:         4.66 cm      Diastology LVIDs:         3.66 cm      LV e' medial:    5.44 cm/s LV PW:         1.16 cm      LV E/e' medial:  16.7 LV IVS:        0.75 cm      LV e' lateral:   5.87 cm/s LVOT diam:     2.00 cm      LV E/e' lateral: 15.5 LV SV:         46 LV SV Index:   27 LVOT Area:     3.14 cm  LV Volumes (MOD) LV vol d, MOD A2C: 80.3 ml LV vol d, MOD A4C: 113.0 ml LV vol s, MOD A2C:  48.5 ml LV vol s, MOD A4C: 56.6 ml LV SV MOD A2C:     31.8 ml LV SV MOD A4C:     113.0 ml LV SV MOD BP:      45.1 ml LEFT ATRIUM             Index LA diam:        3.80 cm 2.27 cm/m LA Vol (A2C):   51.6 ml 30.87 ml/m LA Vol (A4C):   81.3 ml 48.65 ml/m LA Biplane Vol: 65.9 ml 39.43 ml/m  AORTIC VALVE                     PULMONIC VALVE AV Area (Vmax):    1.44 cm      PV Vmax:       0.86 m/s AV Area  (Vmean):   1.21 cm      PV Vmean:      56.700 cm/s AV Area (VTI):     1.53 cm      PV VTI:        0.159 m AV Vmax:           159.00 cm/s   PV Peak grad:  3.0 mmHg AV Vmean:          119.000 cm/s  PV Mean grad:  1.0 mmHg AV VTI:            0.300 m AV Peak Grad:      10.1 mmHg AV Mean Grad:      6.0 mmHg LVOT Vmax:         73.10 cm/s LVOT Vmean:        45.900 cm/s LVOT VTI:          0.146 m LVOT/AV VTI ratio: 0.49  AORTA Ao Root diam: 2.50 cm MITRAL VALVE               TRICUSPID VALVE MV Area (PHT): 2.94 cm    TR Peak grad:   25.4 mmHg MV Area VTI:   1.44 cm    TR Vmax:        252.00 cm/s MV Peak grad:  3.3 mmHg MV Mean grad:  2.0 mmHg    SHUNTS MV Vmax:       0.91 m/s    Systemic VTI:  0.15 m MV Vmean:      60.2 cm/s   Systemic Diam: 2.00 cm MV Decel Time: 258 msec MV E velocity: 90.80 cm/s MV A velocity: 80.60 cm/s MV E/A ratio:  1.13 Donnelly Angelica Electronically signed by Donnelly Angelica Signature Date/Time: 10/25/2021/1:09:32 PM    Final     ECHO LVEF 35-40%  TELEMETRY reviewed by me: NSR frequent PVCs, rate 63  EKG reviewed by me: Sinus rhythm 64, PVCs.  ASSESSMENT AND PLAN:  The patient is an 87 year old female with a past medical history significant for CAD s/p cardiac cath 10/24/21 resulted with an occluded distal OM 3 (too small for PCI), 20% stenosed LAD, HFrEF (LVEF 35-40%), hyperlipidemia, frequent PVCs, hypertension, OSA on CPAP who presented to [facility] ED the evening of 1/22 after a syncopal episode.  #Syncope - likely hypovolemic/hypotensive #?sepsis and possible diarrheal illness The patient reports not generalized fatigue and not feeling good for two days prior to admission and she took "a few of her medications" yesterday evening (presumably some of her antihypertensives), went to use the restroom and had a loose BM, and then felt dizzy and passed out. BP per EMS was in the  96G systolic and has improved with fluids and holding her home meds. She reports feeling a little better overnight.  -agree  with empiric antibiotics and stool studies per primary team.  -Troponins flat 23-22, minimally elevated in the setting of hypotension/hypovolemia. -BP improved today to 836O-294 systolic after antihypertensives held overnight.  -restarting low dose metoprolol 12.40m today with goals to uptitrate as BP allows.  Home medications include telmisartan 80 mg once daily, metoprolol succinate 25 mg once daily, Lasix 40 mg once daily, spironolactone 25 mg once daily. -Recommend patient take her blood pressure at home before taking her antihypertensives and holding them for a SBP <110.  -Continue monitoring on telemetry while inpatient for any arrhythmias  #CAD s/p cath 10/24/21  occluded distal OM 3 (too small for PCI), 20% stenosed LAD #HFrEF (LVEF 35-40%) -Continue Plavix indefinitely, not on aspirin due to allergy. -GDMT as above  #AKI Currently elevated  - creatinine 1.61, EGFR 31. ?dehydration related. Agree with IV fluids.  -baseline 0.74, EGFR > 60  This patient's plan of care was discussed & created with Dr. DLujean Ameland he is in agreement.  Signed: LTristan Schroeder, PA-C 11/15/2021, 1:00 PM

## 2021-11-15 NOTE — ED Notes (Signed)
Pt hooked up to purewick

## 2021-11-15 NOTE — Progress Notes (Signed)
Notified on call practitioner of bp 188/79, orders received.

## 2021-11-15 NOTE — Assessment & Plan Note (Signed)
Transient and resolved. Stop Abx  Sepsis ruled out.

## 2021-11-15 NOTE — Assessment & Plan Note (Signed)
Continue levothyroxine, check TSH.

## 2021-11-16 DIAGNOSIS — I959 Hypotension, unspecified: Secondary | ICD-10-CM

## 2021-11-16 DIAGNOSIS — R197 Diarrhea, unspecified: Secondary | ICD-10-CM | POA: Diagnosis not present

## 2021-11-16 DIAGNOSIS — R7989 Other specified abnormal findings of blood chemistry: Secondary | ICD-10-CM | POA: Diagnosis not present

## 2021-11-16 DIAGNOSIS — R55 Syncope and collapse: Secondary | ICD-10-CM | POA: Diagnosis not present

## 2021-11-16 LAB — BASIC METABOLIC PANEL
Anion gap: 5 (ref 5–15)
BUN: 22 mg/dL (ref 8–23)
CO2: 27 mmol/L (ref 22–32)
Calcium: 9.6 mg/dL (ref 8.9–10.3)
Chloride: 107 mmol/L (ref 98–111)
Creatinine, Ser: 0.82 mg/dL (ref 0.44–1.00)
GFR, Estimated: >60 mL/min (ref >60)
Glucose, Bld: 98 mg/dL (ref 70–99)
Potassium: 4.3 mmol/L (ref 3.5–5.1)
Sodium: 139 mmol/L (ref 135–145)

## 2021-11-16 LAB — TROPONIN I (HIGH SENSITIVITY)
Troponin I (High Sensitivity): 19 ng/L — ABNORMAL HIGH (ref <18)
Troponin I (High Sensitivity): 21 ng/L — ABNORMAL HIGH (ref <18)

## 2021-11-16 LAB — CBC
HCT: 40.6 % (ref 36.0–46.0)
Hemoglobin: 13.7 g/dL (ref 12.0–15.0)
MCH: 30.9 pg (ref 26.0–34.0)
MCHC: 33.7 g/dL (ref 30.0–36.0)
MCV: 91.4 fL (ref 80.0–100.0)
Platelets: 237 10*3/uL (ref 150–400)
RBC: 4.44 MIL/uL (ref 3.87–5.11)
RDW: 14.1 % (ref 11.5–15.5)
WBC: 6.1 10*3/uL (ref 4.0–10.5)
nRBC: 0 % (ref 0.0–0.2)

## 2021-11-16 LAB — URINE CULTURE: Culture: NO GROWTH

## 2021-11-16 LAB — GLUCOSE, CAPILLARY
Glucose-Capillary: 92 mg/dL (ref 70–99)
Glucose-Capillary: 123 mg/dL — ABNORMAL HIGH (ref 70–99)

## 2021-11-16 LAB — PROCALCITONIN: Procalcitonin: <0.1 ng/mL

## 2021-11-16 MED ORDER — HYDRALAZINE HCL 20 MG/ML IJ SOLN
2.0000 mg | Freq: Once | INTRAMUSCULAR | Status: AC
  Administered 2021-11-16: 04:00:00 2 mg via INTRAVENOUS
  Filled 2021-11-16: qty 1

## 2021-11-16 MED ORDER — FUROSEMIDE 20 MG PO TABS: 20.0000 mg | ORAL_TABLET | Freq: Every day | ORAL | 0 refills | Status: DC

## 2021-11-16 MED ORDER — NITROGLYCERIN 0.4 MG SL SUBL
0.4000 mg | SUBLINGUAL_TABLET | SUBLINGUAL | Status: DC | PRN
  Administered 2021-11-16: 06:00:00 0.4 mg via SUBLINGUAL
  Filled 2021-11-16 (×2): qty 1

## 2021-11-16 MED ORDER — ONDANSETRON HCL 4 MG/2ML IJ SOLN
4.0000 mg | Freq: Once | INTRAMUSCULAR | Status: AC
Start: 2021-11-16 — End: 2021-11-16
  Administered 2021-11-16: 06:00:00 4 mg via INTRAVENOUS
  Filled 2021-11-16: qty 2

## 2021-11-16 MED ORDER — IRBESARTAN 150 MG PO TABS
75.0000 mg | ORAL_TABLET | Freq: Every day | ORAL | Status: DC
  Administered 2021-11-16: 09:00:00 75 mg via ORAL
  Filled 2021-11-16: qty 1

## 2021-11-16 MED ORDER — ISOSORBIDE MONONITRATE ER 30 MG PO TB24
30.0000 mg | ORAL_TABLET | Freq: Every day | ORAL | Status: DC
  Administered 2021-11-16: 09:00:00 30 mg via ORAL
  Filled 2021-11-16: qty 1

## 2021-11-16 MED ORDER — ACETAMINOPHEN 500 MG PO TABS
500.0000 mg | ORAL_TABLET | Freq: Once | ORAL | Status: AC
  Administered 2021-11-16: 07:00:00 500 mg via ORAL
  Filled 2021-11-16: qty 1

## 2021-11-16 MED ORDER — ISOSORBIDE MONONITRATE ER 30 MG PO TB24
30.0000 mg | ORAL_TABLET | Freq: Every day | ORAL | 0 refills | Status: DC
Start: 2021-11-17 — End: 2022-02-11

## 2021-11-16 MED ORDER — MECLIZINE HCL 25 MG PO TABS: 25.0000 mg | ORAL_TABLET | Freq: Two times a day (BID) | ORAL | 0 refills | Status: AC | PRN

## 2021-11-16 NOTE — Progress Notes (Signed)
° °      CROSS COVER NOTE  NAME: ZIONAH VERTUCCI MRN: [id number] DOB : May 05, 1935   Secure chat received from RN reporting patient experiencing 9 out of 10 chest pressure and nausea. Denies dyspnea. Not diaphoretic.  Recently admitted  (1/1-1/4) with NSTEMI and cathed which found OM3 occlusion too distal for intervention and LAD with 20% stenosis. BP 173/89 HR 59 RR 18 SPO2 100% on 4L [place].  EKG ordered - No ST elevations or T wave abnormalities  STAT Troponin ordered at 0555-  call to lab to 0645 for them to come collect it  SL Nitroglycerin ordered - Chest pain free after one dose   Zofran ordered for nausea and acetaminophen ordered for nitro induced headache   Judithann Sheen, MSN, FNP-BC Nurse Practitioner [facility] Pager [phone]

## 2021-11-16 NOTE — Progress Notes (Signed)
Patient arrived to room 2A48 from ED.  Assessment complete, VS obtained, and Admission database began.  °

## 2021-11-16 NOTE — Progress Notes (Signed)
[facility] [address] [employer] [facility]) Hospital Liaison Note  Notified by Angeline Slim, LCSW [facility] manager of patient/family request for [facility] Palliative services at home after discharge.  [facility] hospital liaison will follow patient for discharge disposition.  Please call with any hospice or outpatient palliative care related questions.  Thank you for the opportunity to participate in this patient's care.  Celene Squibb, RN, BSN [facility] Liasion [phone]

## 2021-11-16 NOTE — Care Management CC44 (Signed)
Condition Code 44 Documentation Completed  Patient Details  Name: Andrea Phillips MRN: [id number] Date of Birth: 25-May-1935   Condition Code 44 given:  Yes Patient signature on Condition Code 44 notice:  Yes Documentation of 2 MD's agreement:  Yes Code 44 added to claim:  Yes    Gildardo Griffesshley M Iwao Shamblin, LCSW 11/16/2021, 2:25 PM

## 2021-11-16 NOTE — Progress Notes (Signed)
CARDIOLOGY CONSULT NOTE               Patient ID: Andrea Phillips MRN: [id number] DOB/AGE: 85-Nov-1936 85 y.o.  Admit date: 11/14/2021 Referring Physician Dr Max Sane Primary Physician none Primary Cardiologist Nehemiah Massed Reason for Consultation syncope  HPI: The patient is an 87 year old female with a past medical history significant for CAD s/p cardiac cath 10/24/21 resulted with an occluded distal OM 3 (too small for PCI), 20% stenosed LAD, HFrEF (LVEF 35-40%), hyperlipidemia, frequent PVCs, hypertension, OSA on CPAP who presented to [facility] ED the evening of 1/22 after a syncopal episode.  Interval history: -Blood pressure remained high throughout the day yesterday, peaking at 188/79 while only on low dose metoprolol and required IV hydralazine at 0400 today -Reported aching in her chest at rest that lasted about 30mns this morning, relieved by SL nitro x1, EKG at the time was unchanged from prior and non ischemic. She was also started on 4L O2 by [place] at the time, troponin x1 at 19 (trended 23-22-19 from admission)  -review of tele showed some bradycardia this morning after metoprolol  -She reports some intermittent lightheadedness last night when getting up to use the restroom  Review of systems complete and found to be negative unless listed above     Past Medical History:  Diagnosis Date   Arthritis    ra   Asthma    CHF (congestive heart failure) (HCC)    Dysrhythmia    Edema extremities    GERD (gastroesophageal reflux disease)    Gout    Headache    History of hiatal hernia    Hypertension    Hypothyroidism    Shortness of breath dyspnea    Sleep apnea     Past Surgical History:  Procedure Laterality Date   APPENDECTOMY     BACK SURGERY     CARDIAC CATHETERIZATION     CATARACT EXTRACTION W/PHACO Left 03/09/2015   Procedure: CATARACT EXTRACTION PHACO AND INTRAOCULAR LENS PLACEMENT ([place];  Surgeon: SEstill Cotta MD;  Location: [facility] ORS;  Service:  Ophthalmology;  Laterality: Left;  [place]01:31 AP% 26.1 CDE 43.72   CATARACT EXTRACTION W/PHACO Right 08/19/2021   Procedure: CATARACT EXTRACTION PHACO AND INTRAOCULAR LENS PLACEMENT ([place] RIGHT;  Surgeon: PBirder Robson MD;  Location: [facility] ORS;  Service: Ophthalmology;  Laterality: Right;  12.31 1:07.0   CATARACT EXTRACTION W/PHACO Right 09/07/2021   Procedure: REMOVAL OF LENS FRAGMENTS RIGHT;  Surgeon: PBirder Robson MD;  [facility]  Service: Ophthalmology;  Laterality: Right;   CHOLECYSTECTOMY     CORONARY/GRAFT ACUTE MI REVASCULARIZATION N/A 10/24/2021   Procedure: Coronary/Graft Acute MI Revascularization;  Surgeon: ENelva Bush MD;  [facility];  Service: Cardiovascular;  Laterality: N/A;   EYE SURGERY     retina   JOINT REPLACEMENT     left knee   LEFT HEART CATH AND CORONARY ANGIOGRAPHY N/A 04/02/2021   Procedure: LEFT HEART CATH AND CORONARY ANGIOGRAPHY;  Surgeon: KCorey Skains MD;  [facility];  Service: Cardiovascular;  Laterality: N/A;   LEFT HEART CATH AND CORONARY ANGIOGRAPHY N/A 10/24/2021   Procedure: LEFT HEART CATH AND CORONARY ANGIOGRAPHY;  Surgeon: ENelva Bush MD;  [facility];  Service: Cardiovascular;  Laterality: N/A;    Medications Prior to Admission  Medication Sig Dispense Refill Last Dose   cetirizine (ZYRTEC) 10 MG tablet Take 10 mg by mouth daily.   Past Week   clopidogrel (PLAVIX) 75  MG tablet Take 1 tablet (75 mg total) by mouth daily with breakfast. 30 tablet 0 11/14/2021   fluticasone (FLONASE) 50 MCG/ACT nasal spray Place 1 spray into both nostrils 2 (two) times daily as needed for allergies or rhinitis.   Past Week   furosemide (LASIX) 40 MG tablet Take 40 mg by mouth daily.   11/14/2021   icosapent Ethyl (VASCEPA) 1 g capsule Take 2 g by mouth 2 (two) times daily.   11/14/2021   levothyroxine (SYNTHROID) 112 MCG tablet Take 112 mcg by mouth daily.   11/14/2021 at  0800   magnesium oxide (MAG-OX) 400 MG tablet Take 400 mg by mouth daily.   prn   metoprolol succinate (TOPROL-XL) 25 MG 24 hr tablet Take 1 tablet (25 mg total) by mouth daily. 30 tablet 0 11/14/2021   mometasone-formoterol (DULERA) 200-5 MCG/ACT AERO Inhale 2 puffs into the lungs 2 (two) times daily. 1 each 1 11/14/2021   omeprazole (PRILOSEC OTC) 20 MG tablet Take 20 mg by mouth daily.   prn   pravastatin (PRAVACHOL) 20 MG tablet Take 1 tablet (20 mg total) by mouth daily at 6 PM. 30 tablet 0 11/14/2021   spironolactone (ALDACTONE) 25 MG tablet Take 25 mg by mouth daily.   11/14/2021   telmisartan (MICARDIS) 80 MG tablet Take 80 mg by mouth daily.   11/14/2021 at 2000   albuterol (VENTOLIN HFA) 108 (90 Base) MCG/ACT inhaler Inhale 2 puffs into the lungs every 6 (six) hours as needed for wheezing or shortness of breath.   prn   meclizine (ANTIVERT) 25 MG tablet Take 25 mg by mouth 2 (two) times daily as needed for dizziness. (Patient not taking: Reported on 11/15/2021)   Not Taking    Social History   Socioeconomic History   Marital status: Widowed    Spouse name: Not on file   Number of children: Not on file   Years of education: Not on file   Highest education level: Not on file  Occupational History   Not on file  Tobacco Use   Smoking status: Never   Smokeless tobacco: Never  Substance and Sexual Activity   Alcohol use: No   Drug use: No   Sexual activity: Not on file  Other Topics Concern   Not on file  Social History Narrative   Not on file   Social Determinants of Health   Financial Resource Strain: Not on file  Food Insecurity: Not on file  Transportation Needs: Not on file  Physical Activity: Not on file  Stress: Not on file  Social Connections: Not on file  Intimate Partner Violence: Not on file    Family History  Problem Relation Age of Onset   Heart disease Mother    Heart disease Father    Breast cancer Cousin       Review of systems complete and found to  be negative unless listed above    PHYSICAL EXAM General: Pleasant elderly Caucasian female, well nourished, in no acute distress. Sitting at incline in PCU bed after finishing some breakfast HEENT:  Normocephalic and atraumatic. Neck:  No JVD.  Lungs: Normal respiratory effort on 4L by [place]. Clear bilaterally to auscultation. No wheezes, crackles, rhonchi.  Heart: HRRR with frequent extra beats. Normal S1 and S2 without gallops or murmurs. Radial & DP pulses 2+ bilaterally. Abdomen: Non-distended appearing.  Msk: Normal strength and tone for age. Extremities: No clubbing, cyanosis or edema.   Neuro: Alert and oriented X 3. Psych:  Mood appropriate, affect congruent.   Labs:   Lab Results  Component Value Date   WBC 6.1 11/16/2021   HGB 13.7 11/16/2021   HCT 40.6 11/16/2021   MCV 91.4 11/16/2021   PLT 237 11/16/2021    Recent Labs  Lab 11/14/21 2232 11/15/21 0755 11/16/21 0647  NA 139  --  139  K 4.3  --  4.3  CL 104  --  107  CO2 25  --  27  BUN 35*  --  22  CREATININE 1.61*   < > 0.82  CALCIUM 10.5*  --  9.6  PROT 6.8  --   --   BILITOT 1.1  --   --   ALKPHOS 50  --   --   ALT 13  --   --   AST 20  --   --   GLUCOSE 156*  --  98   < > = values in this interval not displayed.    Lab Results  Component Value Date   CKTOTAL 113 04/02/2021     Lab Results  Component Value Date   CHOL 185 10/25/2021   CHOL 170 04/02/2021   Lab Results  Component Value Date   HDL 49 10/25/2021   HDL 46 04/02/2021   Lab Results  Component Value Date   LDLCALC 116 (H) 10/25/2021   LDLCALC 112 (H) 04/02/2021   Lab Results  Component Value Date   TRIG 99 10/25/2021   TRIG 60 04/02/2021   Lab Results  Component Value Date   CHOLHDL 3.8 10/25/2021   CHOLHDL 3.7 04/02/2021   No results found for: LDLDIRECT    Radiology: DG Chest 2 View  Result Date: 10/24/2021 CLINICAL DATA:  87 year old female with history of shortness of breath and chest pain. EXAM: CHEST - 2 VIEW  COMPARISON:  Chest x-ray 08/29/2021. FINDINGS: Lung volumes are normal. Persistent areas of peribronchial cuffing, interstitial prominence an ill-defined airspace disease throughout the mid to lower lungs bilaterally. No pleural effusions. No pneumothorax. No pulmonary nodule or mass noted. Pulmonary vasculature and the cardiomediastinal silhouette are within normal limits. Atherosclerosis in the thoracic aorta. IMPRESSION: 1. Persistent bilateral lower lobe bronchopneumonia, with slightly improved aeration in the right lower lobe compared to the prior examination. Electronically Signed   By: Vinnie Langton M.D.   On: 10/24/2021 13:21   CT Head Wo Contrast  Result Date: 11/14/2021 CLINICAL DATA:  Head trauma, minor (Age >= 65y); Neck trauma (Age >= 65y). Fall. EXAM: CT HEAD WITHOUT CONTRAST CT CERVICAL SPINE WITHOUT CONTRAST TECHNIQUE: Multidetector CT imaging of the head and cervical spine was performed following the standard protocol without intravenous contrast. Multiplanar CT image reconstructions of the cervical spine were also generated. RADIATION DOSE REDUCTION: This exam was performed according to the departmental dose-optimization program which includes automated exposure control, adjustment of the mA and/or kV according to patient size and/or use of iterative reconstruction technique. COMPARISON:  None. FINDINGS: CT HEAD FINDINGS Brain: Normal anatomic configuration. Parenchymal volume loss is commensurate with the patient's age. No abnormal intra or extra-axial mass lesion or fluid collection. No abnormal mass effect or midline shift. No evidence of acute intracranial hemorrhage or infarct. Ventricular size is normal. Cerebellum unremarkable. Vascular: No asymmetric hyperdense vasculature at the skull base. Skull: Intact Sinuses/Orbits: There is opacification of several right ethmoid air cells and mild mucosal thickening noted within the sphenoid sinuses, visualized maxillary sinuses, right frontal  sinus. Remaining paranasal sinuses are clear. Ocular lenses have been removed. Remote  right medial orbital wall fracture noted. Orbits are otherwise unremarkable. Other: Mastoid air cells and middle ear cavities are clear. CT CERVICAL SPINE FINDINGS Alignment: Straightening of the cervical spine. Minimal retrolisthesis of C5 upon C6 is stable. Skull base and vertebrae: Grade cervical alignment is normal. Landau dental interval is not widened. Advanced degenerative changes are noted at the craniocervical junction. No acute fracture of the cervical spine Soft tissues and spinal canal: Posterior disc osteophyte complex at C2-3 results in mild central canal stenosis with flattening of the thecal sac. No prevertebral soft tissue swelling or paravertebral fluid collections are identified. No canal hematoma. Disc levels: There is intervertebral disc space narrowing and endplate remodeling throughout the cervical spine in keeping with changes of moderate to severe degenerative disc disease. Prevertebral soft tissues are not thickened. Multilevel uncovertebral arthrosis results in multilevel moderate neuroforaminal narrowing, most severe bilaterally at C3-4 on the right at C4-5 bilaterally at C5-6 and bilaterally at C6-7. Upper chest: Unremarkable Other: None IMPRESSION: No acute intracranial injury.  No calvarial fracture. No acute fracture or listhesis of the cervical spine. Electronically Signed   By: Fidela Salisbury M.D.   On: 11/14/2021 23:56   CT Cervical Spine Wo Contrast  Result Date: 11/14/2021 CLINICAL DATA:  Head trauma, minor (Age >= 65y); Neck trauma (Age >= 65y). Fall. EXAM: CT HEAD WITHOUT CONTRAST CT CERVICAL SPINE WITHOUT CONTRAST TECHNIQUE: Multidetector CT imaging of the head and cervical spine was performed following the standard protocol without intravenous contrast. Multiplanar CT image reconstructions of the cervical spine were also generated. RADIATION DOSE REDUCTION: This exam was performed  according to the departmental dose-optimization program which includes automated exposure control, adjustment of the mA and/or kV according to patient size and/or use of iterative reconstruction technique. COMPARISON:  None. FINDINGS: CT HEAD FINDINGS Brain: Normal anatomic configuration. Parenchymal volume loss is commensurate with the patient's age. No abnormal intra or extra-axial mass lesion or fluid collection. No abnormal mass effect or midline shift. No evidence of acute intracranial hemorrhage or infarct. Ventricular size is normal. Cerebellum unremarkable. Vascular: No asymmetric hyperdense vasculature at the skull base. Skull: Intact Sinuses/Orbits: There is opacification of several right ethmoid air cells and mild mucosal thickening noted within the sphenoid sinuses, visualized maxillary sinuses, right frontal sinus. Remaining paranasal sinuses are clear. Ocular lenses have been removed. Remote right medial orbital wall fracture noted. Orbits are otherwise unremarkable. Other: Mastoid air cells and middle ear cavities are clear. CT CERVICAL SPINE FINDINGS Alignment: Straightening of the cervical spine. Minimal retrolisthesis of C5 upon C6 is stable. Skull base and vertebrae: Grade cervical alignment is normal. Landau dental interval is not widened. Advanced degenerative changes are noted at the craniocervical junction. No acute fracture of the cervical spine Soft tissues and spinal canal: Posterior disc osteophyte complex at C2-3 results in mild central canal stenosis with flattening of the thecal sac. No prevertebral soft tissue swelling or paravertebral fluid collections are identified. No canal hematoma. Disc levels: There is intervertebral disc space narrowing and endplate remodeling throughout the cervical spine in keeping with changes of moderate to severe degenerative disc disease. Prevertebral soft tissues are not thickened. Multilevel uncovertebral arthrosis results in multilevel moderate  neuroforaminal narrowing, most severe bilaterally at C3-4 on the right at C4-5 bilaterally at C5-6 and bilaterally at C6-7. Upper chest: Unremarkable Other: None IMPRESSION: No acute intracranial injury.  No calvarial fracture. No acute fracture or listhesis of the cervical spine. Electronically Signed   By: Fidela Salisbury M.D.   On:  11/14/2021 23:56   CARDIAC CATHETERIZATION  Result Date: 10/24/2021 Conclusions: Severe single-vessel coronary artery disease with occlusion of distal OM3 branch, which is too small/distal for intervention.  There is also mild plaquing in the mid LAD with up to 20% stenosis. Moderately elevated left ventricular filling pressure (LVEDP 25 mmHg). Recommendation: Overnight observation in stepdown unit.  If patient has recurrent chest pain, recommend initiation of IV nitroglycerin and obtaining CTA chest to evaluate for dissection (very high BP on presentation) and pulmonary embolism (recent hospitalization for pneumonia). Continue clopidogrel 75 mg daily, given history of aspirin intolerance. Restart IV heparin in 2 hours; recommend completing 48 hours of heparin (if no contraindication develops) for medical management of NSTEMI. Follow-up echocardiogram. Aggressive secondary prevention of coronary artery disease.  Consider rechallenging with statin versus outpatient trial of PCSK-9 inhibitor. Consider gentle diuresis. Nelva Bush, MD [facility]  DG Chest Port 1 View  Result Date: 11/14/2021 CLINICAL DATA:  Fall EXAM: PORTABLE CHEST 1 VIEW COMPARISON:  10/24/2021, CT 10/25/2021, radiograph 08/29/2021 FINDINGS: No focal opacity or pleural effusion. Stable cardiomediastinal silhouette with aortic atherosclerosis. Streaky scarring or atelectasis at the left base. No pneumothorax. IMPRESSION: Minimal streaky atelectasis or scarring at the bases. No new airspace disease Electronically Signed   By: Donavan Foil M.D.   On: 11/14/2021 22:57   CT ANGIO CHEST AORTA W/CM &/OR  WO/CM  Result Date: 10/25/2021 CLINICAL DATA:  Evaluate for acute aortic syndrome. Chest pain radiating into right arm. EXAM: CT ANGIOGRAPHY CHEST WITH CONTRAST TECHNIQUE: Multidetector CT imaging of the chest was performed using the standard protocol during bolus administration of intravenous contrast. Multiplanar CT image reconstructions and MIPs were obtained to evaluate the vascular anatomy. CONTRAST:  73m OMNIPAQUE IOHEXOL 350 MG/ML SOLN COMPARISON:  10/11/2021 FINDINGS: Cardiovascular: No aortic intramural hemotoma. Preferential opacification of the thoracic aorta. No evidence of thoracic aortic aneurysm or dissection. Normal heart size. No pericardial effusion. Aortic atherosclerosis and mild coronary artery calcifications. The main pulmonary artery appears patent. No signs of central obstructing pulmonary embolus. Mediastinum/Nodes: Thyroid gland appears atrophic or surgically absent. Trachea is patent and midline. Normal appearance of the esophagus. No enlarged axillary, supraclavicular, mediastinal, or hilar lymph nodes. Lungs/Pleura: No pleural effusion. Stable bandlike area of scarring within the posteromedial left lower lobe. Similar appearance of right lower lobe peripheral and basilar predominant areas of subsegmental atelectasis and subpleural consolidation. Findings compatible with postinflammatory or infectious process. Upper Abdomen: No acute abnormality. Aortic atherosclerosis. Hiatal hernia. Status post cholecystectomy. Unchanged appearance of left portal hepatic venous shunt within segment 3 of the liver, image 92/4. Musculoskeletal: Spondylosis identified within the thoracic spine. No acute or suspicious osseous findings. Review of the MIP images confirms the above findings. IMPRESSION: 1. No evidence for acute aortic syndrome. 2. Similar appearance of peripheral and basilar predominant areas of subsegmental atelectasis and subpleural consolidation with thickening of the peribronchovascular  interstitium compatible with postinflammatory or infectious process. Suggest continued interval follow-up with repeat CT of the chest in 3 months to ensure complete resolution. 3. Aortic Atherosclerosis (ICD10-I70.0). Coronary artery calcifications. Electronically Signed   By: TKerby MoorsM.D.   On: 10/25/2021 08:26   ECHOCARDIOGRAM COMPLETE  Result Date: 10/25/2021    ECHOCARDIOGRAM REPORT   Patient Name:   DMIKIYA NEBERGALLDate of Exam: 10/25/2021 Medical Rec #:  [id number]          Height:       60.0 in Accession #:    [id number]  Weight:       154.2 lb Date of Birth:  1935-07-16           BSA:          1.671 m Patient Age:    24 years            BP:           130/57 mmHg Patient Gender: F                   HR:           68 bpm. Exam Location:  [facility] Procedure: 2D Echo, Color Doppler, Cardiac Doppler and Intracardiac            Opacification Agent Indications:     R07.9 Chest Pain; Elevated troponin  History:         Patient has prior history of Echocardiogram examinations, most                  recent 04/02/2021. CHF; Risk Factors:Hypertension and Sleep                  Apnea.  Sonographer:     Charmayne Sheer Referring Phys:  [id number] AMY N [facility] Diagnosing Phys: Donnelly Angelica  Sonographer Comments: Suboptimal apical window and no subcostal window. IMPRESSIONS  1. Left ventricular ejection fraction, by estimation, is 50 to 55%. The left ventricle has low normal function. The left ventricle has no regional wall motion abnormalities. Left ventricular diastolic parameters are consistent with Grade II diastolic dysfunction (pseudonormalization).  2. Right ventricular systolic function is normal. The right ventricular size is normal.  3. Left atrial size was mildly dilated.  4. The mitral valve is normal in structure. No evidence of mitral valve regurgitation. No evidence of mitral stenosis.  5. The aortic valve is grossly normal. Aortic valve regurgitation is not visualized. No aortic stenosis is present.  FINDINGS  Left Ventricle: Left ventricular ejection fraction, by estimation, is 50 to 55%. The left ventricle has low normal function. The left ventricle has no regional wall motion abnormalities. Definity contrast agent was given IV to delineate the left ventricular endocardial borders. The left ventricular internal cavity size was normal in size. There is no left ventricular hypertrophy. Left ventricular diastolic parameters are consistent with Grade II diastolic dysfunction (pseudonormalization). Right Ventricle: The right ventricular size is normal. Right vetricular wall thickness was not well visualized. Right ventricular systolic function is normal. Left Atrium: Left atrial size was mildly dilated. Right Atrium: Right atrial size was not well visualized. Pericardium: There is no evidence of pericardial effusion. Mitral Valve: The mitral valve is normal in structure. No evidence of mitral valve regurgitation. No evidence of mitral valve stenosis. MV peak gradient, 3.3 mmHg. The mean mitral valve gradient is 2.0 mmHg. Tricuspid Valve: The tricuspid valve is not well visualized. Tricuspid valve regurgitation is not demonstrated. Aortic Valve: The aortic valve is grossly normal. Aortic valve regurgitation is not visualized. No aortic stenosis is present. Aortic valve mean gradient measures 6.0 mmHg. Aortic valve peak gradient measures 10.1 mmHg. Aortic valve area, by VTI measures  1.53 cm. Pulmonic Valve: The pulmonic valve was not well visualized. Pulmonic valve regurgitation is not visualized. Aorta: The aortic root is normal in size and structure. Venous: The inferior vena cava was not well visualized. IAS/Shunts: The interatrial septum was not well visualized.  LEFT VENTRICLE PLAX 2D LVIDd:         4.66 cm  Diastology LVIDs:         3.66 cm      LV e' medial:    5.44 cm/s LV PW:         1.16 cm      LV E/e' medial:  16.7 LV IVS:        0.75 cm      LV e' lateral:   5.87 cm/s LVOT diam:     2.00 cm      LV  E/e' lateral: 15.5 LV SV:         46 LV SV Index:   27 LVOT Area:     3.14 cm  LV Volumes (MOD) LV vol d, MOD A2C: 80.3 ml LV vol d, MOD A4C: 113.0 ml LV vol s, MOD A2C: 48.5 ml LV vol s, MOD A4C: 56.6 ml LV SV MOD A2C:     31.8 ml LV SV MOD A4C:     113.0 ml LV SV MOD BP:      45.1 ml LEFT ATRIUM             Index LA diam:        3.80 cm 2.27 cm/m LA Vol (A2C):   51.6 ml 30.87 ml/m LA Vol (A4C):   81.3 ml 48.65 ml/m LA Biplane Vol: 65.9 ml 39.43 ml/m  AORTIC VALVE                     PULMONIC VALVE AV Area (Vmax):    1.44 cm      PV Vmax:       0.86 m/s AV Area (Vmean):   1.21 cm      PV Vmean:      56.700 cm/s AV Area (VTI):     1.53 cm      PV VTI:        0.159 m AV Vmax:           159.00 cm/s   PV Peak grad:  3.0 mmHg AV Vmean:          119.000 cm/s  PV Mean grad:  1.0 mmHg AV VTI:            0.300 m AV Peak Grad:      10.1 mmHg AV Mean Grad:      6.0 mmHg LVOT Vmax:         73.10 cm/s LVOT Vmean:        45.900 cm/s LVOT VTI:          0.146 m LVOT/AV VTI ratio: 0.49  AORTA Ao Root diam: 2.50 cm MITRAL VALVE               TRICUSPID VALVE MV Area (PHT): 2.94 cm    TR Peak grad:   25.4 mmHg MV Area VTI:   1.44 cm    TR Vmax:        252.00 cm/s MV Peak grad:  3.3 mmHg MV Mean grad:  2.0 mmHg    SHUNTS MV Vmax:       0.91 m/s    Systemic VTI:  0.15 m MV Vmean:      60.2 cm/s   Systemic Diam: 2.00 cm MV Decel Time: 258 msec MV E velocity: 90.80 cm/s MV A velocity: 80.60 cm/s MV E/A ratio:  1.13 Donnelly Angelica Electronically signed by Donnelly Angelica Signature Date/Time: 10/25/2021/1:09:32 PM    Final     ECHO LVEF 35-40%  TELEMETRY reviewed by me: Mostly normal sinus  rhythm, frequent PVCs, some periods of bradycardia with heart rate into the 40s, and dropped beat.  EKG reviewed by me: Sinus rhythm 64, PVCs.  ASSESSMENT AND PLAN:  The patient is an 87 year old female with a past medical history significant for CAD s/p cardiac cath 10/24/21 resulted with an occluded distal OM 3 (too small for PCI), 20% stenosed  LAD, HFrEF (LVEF 35-40%), hyperlipidemia, frequent PVCs, hypertension, OSA on CPAP who presented to [facility] ED the evening of 1/22 after a syncopal episode.  #Syncope - likely hypovolemic/hypotensive #?sepsis and possible diarrheal illness #chest pain The patient reports not generalized fatigue and not feeling good for two days prior to admission and she took "a few of her medications" the night before admission (presumably some of her antihypertensives), went to use the restroom and had a loose BM, and then felt dizzy and passed out. BP per EMS was in the 07O systolic and improved with fluids and holding her home meds.  -Troponins flat 23-22-19, minimally up in the setting of hypotension/hypovolemia. -BP up after stopping antihypertensives yesterday, initiating imdur 30 to help with chest pain and starting back low dose irbesartan 25m.   -Review of telemetry this morning showed some bradycardia with heart rate into the 40s.  Discontinuing metoprolol. Home medications include telmisartan 80 mg once daily, metoprolol succinate 25 mg once daily, Lasix 40 mg once daily, spironolactone 25 mg once daily. -Recommend patient take her blood pressure at home before taking her antihypertensives and holding them for a SBP <110.  -Continue monitoring on telemetry while inpatient   #CAD s/p cath 10/24/21  occluded distal OM 3 (too small for PCI), 20% stenosed LAD #HFrEF (LVEF 35-40%) -Continue Plavix indefinitely, not on aspirin due to allergy. -GDMT as above  #AKI, resolved Likely dehydration related. S/p IV fluids.  -baseline 0.74, EGFR > 60  This patient's plan of care was discussed & created with Dr. DLujean Ameland he is in agreement.  Signed: LTristan Schroeder, PA-C 11/16/2021, 8:05 AM

## 2021-11-16 NOTE — Progress Notes (Signed)
PT Cancellation Note  Patient Details Name: Andrea Phillips MRN: [id number] DOB: 1935/05/30   Cancelled Treatment:    Reason Eval/Treat Not Completed: PT screened, no needs identified, will sign off. Patient in bathroom independently. She reports no current needs as far as equipment, will have HHPT.   [name] 11/16/2021, 1:56 PM

## 2021-11-16 NOTE — TOC Initial Note (Addendum)
Transition of Care [facility]) - Initial/Assessment Note    Patient Details  Name: Andrea Phillips MRN: [id number] Date of Birth: 04-17-1935  [facility]) CM/SW Contact:    Gildardo Griffes, LCSW Phone Number: 11/16/2021, 1:37 PM  Clinical Narrative:                  CSW spoke with patient who reports being active with Home Health services. CSW notes she is active through [facility] for PT and OT and is also receiving [facility] home services for additional support.   Patient reports she has a walker at home and identifies no DME needs, reports family will be picking her up today for discharge. No further discharge needs.   Is followed by [facility] for outpatient palliative.    Expected Discharge Plan: Home w Home Health Services Barriers to Discharge: No Barriers Identified   Patient Goals and CMS Choice Patient states their goals for this hospitalization and ongoing recovery are:: to go home CMS Medicare.gov Compare Post Acute Care list provided to:: Patient Choice offered to / list presented to : Patient  Expected Discharge Plan and Services Expected Discharge Plan: Home w Home Health Services         Expected Discharge Date: 11/16/21                         HH Arranged: PT, OT, RN HH Agency: [facility] Date HH Agency Contacted: 11/16/21 Time HH Agency Contacted: [id number] Representative spoke with at [facility] Agency: Barbara Cower  Prior Living Arrangements/Services   Lives with:: Self   Do you feel safe going back to the place where you live?: Yes               Activities of Daily Living      Permission Sought/Granted                  Emotional Assessment       Orientation: : Oriented to Self, Oriented to Place, Oriented to  Time, Oriented to Situation Alcohol / Substance Use: Not Applicable Psych Involvement: No (comment)  Admission diagnosis:  Syncope and collapse [R55] AKI (acute kidney injury) (HCC) [N17.9] Elevated  lactic acid level [R79.89] Hypotension, unspecified hypotension type [I95.9] Diarrhea, unspecified type [R19.7] Patient Active Problem List   Diagnosis Date Noted   Hypotension    Elevated lactic acid level    AKI (acute kidney injury) (HCC) 11/15/2021   Diarrhea 11/15/2021   Chest pain    H/O medication noncompliance 10/24/2021   CAP (community acquired pneumonia) 08/31/2021   Multifocal pneumonia 08/29/2021   Acute on chronic systolic CHF (congestive heart failure) (HCC) 04/02/2021   Hypothyroidism 04/02/2021   Syncope and collapse 04/07/2020   Right hip pain 04/07/2020   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 [place] [address] Phone: [phone] Fax: [phone]  [facility][id number] [place], [place] - [address] AT [facility] OF [address] [facility] [address] [place] [address] Phone: [phone] Fax: [phone]     Social Determinants of Health (SDOH) Interventions    Readmission Risk Interventions  No flowsheet data found.

## 2021-11-16 NOTE — Progress Notes (Signed)
Reviewed d/c instructions with pt and sister, aware of f/u appointments, PIVs removed. No distress noted, pt ambulating independently on room air.

## 2021-11-18 NOTE — Discharge Summary (Signed)
Physician Discharge Summary   Patient: Andrea Phillips MRN: [id number] DOB: 1934/11/20  Admit date:     11/14/2021  Discharge date: 11/16/2021  Discharge Physician: Max Sane   PCP: Perrin Maltese, MD   Recommendations at discharge:    Follow up with outpt providers as requested  Discharge Diagnoses Principal Problem:   Syncope and collapse Active Problems:   Essential hypertension   Elevated troponin   Hypothyroidism   Hyperlipidemia, mixed   AKI (acute kidney injury) (HCC)   Diarrhea   Hypotension   Elevated lactic acid level   * Syncope and collapse- (present on admission) Likely due to hypotension. BP improved while here.  Diarrhea- (present on admission) Transient and resolved. Stop Abx Sepsis ruled out.  AKI (acute kidney injury) ([place])- (present on admission) Creat remained stable. Encouraged PO nutrition  Lab Results  Component Value Date   CREATININE 1.65 (H) 11/15/2021   CREATININE 1.61 (H) 11/14/2021   CREATININE 0.74 10/26/2021    Hypothyroidism- (present on admission) Continue levothyroxine  Elevated troponin- (present on admission) Due to demand ischemia. No MI  Essential hypertension- (present on admission) Restarted toprol 12.5 mg once daily Started Imdur per cardio   Consultants: Cardio Disposition: Home Diet recommendation: Cardiac diet  DISCHARGE MEDICATION: Allergies as of 11/16/2021       Reactions   Aspirin Hives   Other Hives   Yellow dye 6 FOOD COLOR YELLOW POWDER   Atorvastatin Other (See Comments)   unknown   Codeine Other (See Comments)   Doesn't know   Conj Estrog-medroxyprogest Ace Other (See Comments)   PREMPRO 0.3-1.5 MG ORAL TABLET unknown   Prednisone Other (See Comments)   Chest pain   Raloxifene Other (See Comments)   EVISTA 60 MG ORAL TABLET unknown   Ace Inhibitors Cough   Amlodipine Itching   Valsartan Itching        Medication List     STOP taking these medications    spironolactone 25 MG  tablet Commonly known as: ALDACTONE       TAKE these medications    albuterol 108 (90 Base) MCG/ACT inhaler Commonly known as: VENTOLIN HFA Inhale 2 puffs into the lungs every 6 (six) hours as needed for wheezing or shortness of breath.   cetirizine 10 MG tablet Commonly known as: ZYRTEC Take 10 mg by mouth daily.   clopidogrel 75 MG tablet Commonly known as: PLAVIX Take 1 tablet (75 mg total) by mouth daily with breakfast.   Dulera 200-5 MCG/ACT Aero Generic drug: mometasone-formoterol Inhale 2 puffs into the lungs 2 (two) times daily.   fluticasone 50 MCG/ACT nasal spray Commonly known as: FLONASE Place 1 spray into both nostrils 2 (two) times daily as needed for allergies or rhinitis.   furosemide 20 MG tablet Commonly known as: LASIX Take 1 tablet (20 mg total) by mouth daily. What changed:  medication strength how much to take   icosapent Ethyl 1 g capsule Commonly known as: VASCEPA Take 2 g by mouth 2 (two) times daily.   isosorbide mononitrate 30 MG 24 hr tablet Commonly known as: IMDUR Take 1 tablet (30 mg total) by mouth daily.   levothyroxine 112 MCG tablet Commonly known as: SYNTHROID Take 112 mcg by mouth daily.   magnesium oxide 400 MG tablet Commonly known as: MAG-OX Take 400 mg by mouth daily.   meclizine 25 MG tablet Commonly known as: ANTIVERT Take 1 tablet (25 mg total) by mouth 2 (two) times daily as needed for up to  10 days for dizziness.   metoprolol succinate 25 MG 24 hr tablet Commonly known as: TOPROL-XL Take 1 tablet (25 mg total) by mouth daily.   omeprazole 20 MG tablet Commonly known as: PRILOSEC OTC Take 20 mg by mouth daily.   pravastatin 20 MG tablet Commonly known as: PRAVACHOL Take 1 tablet (20 mg total) by mouth daily at 6 PM.   telmisartan 80 MG tablet Commonly known as: MICARDIS Take 80 mg by mouth daily.        Follow-up Information     Corey Skains, MD. Go on 12/06/2021.   Specialty:  Cardiology Why: 1-2 weeks after discharge @ 11am Contact information: [address] [facility] [place] [address] [phone]         Perrin Maltese, MD. Schedule an appointment as soon as possible for a visit on 11/23/2021.   Specialty: Internal Medicine Why: [facility] Discharge F/UP @ 11am Contact information: [place] [address] [phone]         Margaretha Sheffield, MD. Schedule an appointment as soon as possible for a visit on 11/22/2021.   Specialty: Otolaryngology Why: [facility] Discharge F/UP @ 2:45pm Contact information: [address] [place] [address] [phone]                 Discharge Exam: Danley Danker Weights   11/14/21 2243 11/15/21 2041  Weight: 74 kg 72 kg    Constitutional:      Appearance: She is not ill-appearing.  Eyes:     Extraocular Movements: Extraocular movements intact.  Pupils: Pupils are equal, round, and reactive to light.  Neck:     Vascular: No carotid bruit.  Cardiovascular:     Rate and Rhythm: Normal rate and regular rhythm.     Pulses: Normal pulses.     Heart sounds: Normal heart sounds.  Pulmonary:     Effort: Pulmonary effort is normal.     Breath sounds: Normal breath sounds.  Abdominal:     soft, bening Neurological:     General: No focal deficit present.     Mental Status: She is alert and oriented to person, place, and time.  Psychiatric:        Mood and Affect: Mood normal.        Behavior: Behavior normal.   Condition at discharge: fair  The results of significant diagnostics from this hospitalization (including imaging, microbiology, ancillary and laboratory) are listed below for reference.   Imaging Studies: DG Chest 2 View  Result Date: 10/24/2021 CLINICAL DATA:  87 year old female with history of shortness of breath and chest pain. EXAM: CHEST - 2 VIEW COMPARISON:  Chest x-ray 08/29/2021. FINDINGS: Lung volumes are normal.  Persistent areas of peribronchial cuffing, interstitial prominence an ill-defined airspace disease throughout the mid to lower lungs bilaterally. No pleural effusions. No pneumothorax. No pulmonary nodule or mass noted. Pulmonary vasculature and the cardiomediastinal silhouette are within normal limits. Atherosclerosis in the thoracic aorta. IMPRESSION: 1. Persistent bilateral lower lobe bronchopneumonia, with slightly improved aeration in the right lower lobe compared to the prior examination. Electronically Signed   By: Vinnie Langton M.D.   On: 10/24/2021 13:21   CT Head Wo Contrast  Result Date: 11/14/2021 CLINICAL DATA:  Head trauma, minor (Age >= 65y); Neck trauma (Age >= 65y). Fall. EXAM: CT HEAD WITHOUT CONTRAST CT CERVICAL SPINE WITHOUT CONTRAST TECHNIQUE: Multidetector CT imaging of the head and cervical spine was performed following the standard protocol without  intravenous contrast. Multiplanar CT image reconstructions of the cervical spine were also generated. RADIATION DOSE REDUCTION: This exam was performed according to the departmental dose-optimization program which includes automated exposure control, adjustment of the mA and/or kV according to patient size and/or use of iterative reconstruction technique. COMPARISON:  None. FINDINGS: CT HEAD FINDINGS Brain: Normal anatomic configuration. Parenchymal volume loss is commensurate with the patient's age. No abnormal intra or extra-axial mass lesion or fluid collection. No abnormal mass effect or midline shift. No evidence of acute intracranial hemorrhage or infarct. Ventricular size is normal. Cerebellum unremarkable. Vascular: No asymmetric hyperdense vasculature at the skull base. Skull: Intact Sinuses/Orbits: There is opacification of several right ethmoid air cells and mild mucosal thickening noted within the sphenoid sinuses, visualized maxillary sinuses, right frontal sinus. Remaining paranasal sinuses are clear. Ocular lenses have been  removed. Remote right medial orbital wall fracture noted. Orbits are otherwise unremarkable. Other: Mastoid air cells and middle ear cavities are clear. CT CERVICAL SPINE FINDINGS Alignment: Straightening of the cervical spine. Minimal retrolisthesis of C5 upon C6 is stable. Skull base and vertebrae: Grade cervical alignment is normal. Landau dental interval is not widened. Advanced degenerative changes are noted at the craniocervical junction. No acute fracture of the cervical spine Soft tissues and spinal canal: Posterior disc osteophyte complex at C2-3 results in mild central canal stenosis with flattening of the thecal sac. No prevertebral soft tissue swelling or paravertebral fluid collections are identified. No canal hematoma. Disc levels: There is intervertebral disc space narrowing and endplate remodeling throughout the cervical spine in keeping with changes of moderate to severe degenerative disc disease. Prevertebral soft tissues are not thickened. Multilevel uncovertebral arthrosis results in multilevel moderate neuroforaminal narrowing, most severe bilaterally at C3-4 on the right at C4-5 bilaterally at C5-6 and bilaterally at C6-7. Upper chest: Unremarkable Other: None IMPRESSION: No acute intracranial injury.  No calvarial fracture. No acute fracture or listhesis of the cervical spine. Electronically Signed   By: Fidela Salisbury M.D.   On: 11/14/2021 23:56   CT Cervical Spine Wo Contrast  Result Date: 11/14/2021 CLINICAL DATA:  Head trauma, minor (Age >= 65y); Neck trauma (Age >= 65y). Fall. EXAM: CT HEAD WITHOUT CONTRAST CT CERVICAL SPINE WITHOUT CONTRAST TECHNIQUE: Multidetector CT imaging of the head and cervical spine was performed following the standard protocol without intravenous contrast. Multiplanar CT image reconstructions of the cervical spine were also generated. RADIATION DOSE REDUCTION: This exam was performed according to the departmental dose-optimization program which includes  automated exposure control, adjustment of the mA and/or kV according to patient size and/or use of iterative reconstruction technique. COMPARISON:  None. FINDINGS: CT HEAD FINDINGS Brain: Normal anatomic configuration. Parenchymal volume loss is commensurate with the patient's age. No abnormal intra or extra-axial mass lesion or fluid collection. No abnormal mass effect or midline shift. No evidence of acute intracranial hemorrhage or infarct. Ventricular size is normal. Cerebellum unremarkable. Vascular: No asymmetric hyperdense vasculature at the skull base. Skull: Intact Sinuses/Orbits: There is opacification of several right ethmoid air cells and mild mucosal thickening noted within the sphenoid sinuses, visualized maxillary sinuses, right frontal sinus. Remaining paranasal sinuses are clear. Ocular lenses have been removed. Remote right medial orbital wall fracture noted. Orbits are otherwise unremarkable. Other: Mastoid air cells and middle ear cavities are clear. CT CERVICAL SPINE FINDINGS Alignment: Straightening of the cervical spine. Minimal retrolisthesis of C5 upon C6 is stable. Skull base and vertebrae: Grade cervical alignment is normal. Landau dental interval is not widened. Advanced  degenerative changes are noted at the craniocervical junction. No acute fracture of the cervical spine Soft tissues and spinal canal: Posterior disc osteophyte complex at C2-3 results in mild central canal stenosis with flattening of the thecal sac. No prevertebral soft tissue swelling or paravertebral fluid collections are identified. No canal hematoma. Disc levels: There is intervertebral disc space narrowing and endplate remodeling throughout the cervical spine in keeping with changes of moderate to severe degenerative disc disease. Prevertebral soft tissues are not thickened. Multilevel uncovertebral arthrosis results in multilevel moderate neuroforaminal narrowing, most severe bilaterally at C3-4 on the right at C4-5  bilaterally at C5-6 and bilaterally at C6-7. Upper chest: Unremarkable Other: None IMPRESSION: No acute intracranial injury.  No calvarial fracture. No acute fracture or listhesis of the cervical spine. Electronically Signed   By: Fidela Salisbury M.D.   On: 11/14/2021 23:56   CARDIAC CATHETERIZATION  Result Date: 10/24/2021 Conclusions: Severe single-vessel coronary artery disease with occlusion of distal OM3 branch, which is too small/distal for intervention.  There is also mild plaquing in the mid LAD with up to 20% stenosis. Moderately elevated left ventricular filling pressure (LVEDP 25 mmHg). Recommendation: Overnight observation in stepdown unit.  If patient has recurrent chest pain, recommend initiation of IV nitroglycerin and obtaining CTA chest to evaluate for dissection (very high BP on presentation) and pulmonary embolism (recent hospitalization for pneumonia). Continue clopidogrel 75 mg daily, given history of aspirin intolerance. Restart IV heparin in 2 hours; recommend completing 48 hours of heparin (if no contraindication develops) for medical management of NSTEMI. Follow-up echocardiogram. Aggressive secondary prevention of coronary artery disease.  Consider rechallenging with statin versus outpatient trial of PCSK-9 inhibitor. Consider gentle diuresis. Nelva Bush, MD [facility]  DG Chest Port 1 View  Result Date: 11/14/2021 CLINICAL DATA:  Fall EXAM: PORTABLE CHEST 1 VIEW COMPARISON:  10/24/2021, CT 10/25/2021, radiograph 08/29/2021 FINDINGS: No focal opacity or pleural effusion. Stable cardiomediastinal silhouette with aortic atherosclerosis. Streaky scarring or atelectasis at the left base. No pneumothorax. IMPRESSION: Minimal streaky atelectasis or scarring at the bases. No new airspace disease Electronically Signed   By: Donavan Foil M.D.   On: 11/14/2021 22:57   CT ANGIO CHEST AORTA W/CM &/OR WO/CM  Result Date: 10/25/2021 CLINICAL DATA:  Evaluate for acute aortic syndrome.  Chest pain radiating into right arm. EXAM: CT ANGIOGRAPHY CHEST WITH CONTRAST TECHNIQUE: Multidetector CT imaging of the chest was performed using the standard protocol during bolus administration of intravenous contrast. Multiplanar CT image reconstructions and MIPs were obtained to evaluate the vascular anatomy. CONTRAST:  2mL OMNIPAQUE IOHEXOL 350 MG/ML SOLN COMPARISON:  10/11/2021 FINDINGS: Cardiovascular: No aortic intramural hemotoma. Preferential opacification of the thoracic aorta. No evidence of thoracic aortic aneurysm or dissection. Normal heart size. No pericardial effusion. Aortic atherosclerosis and mild coronary artery calcifications. The main pulmonary artery appears patent. No signs of central obstructing pulmonary embolus. Mediastinum/Nodes: Thyroid gland appears atrophic or surgically absent. Trachea is patent and midline. Normal appearance of the esophagus. No enlarged axillary, supraclavicular, mediastinal, or hilar lymph nodes. Lungs/Pleura: No pleural effusion. Stable bandlike area of scarring within the posteromedial left lower lobe. Similar appearance of right lower lobe peripheral and basilar predominant areas of subsegmental atelectasis and subpleural consolidation. Findings compatible with postinflammatory or infectious process. Upper Abdomen: No acute abnormality. Aortic atherosclerosis. Hiatal hernia. Status post cholecystectomy. Unchanged appearance of left portal hepatic venous shunt within segment 3 of the liver, image 92/4. Musculoskeletal: Spondylosis identified within the thoracic spine. No acute or suspicious osseous  findings. Review of the MIP images confirms the above findings. IMPRESSION: 1. No evidence for acute aortic syndrome. 2. Similar appearance of peripheral and basilar predominant areas of subsegmental atelectasis and subpleural consolidation with thickening of the peribronchovascular interstitium compatible with postinflammatory or infectious process. Suggest  continued interval follow-up with repeat CT of the chest in 3 months to ensure complete resolution. 3. Aortic Atherosclerosis (ICD10-I70.0). Coronary artery calcifications. Electronically Signed   By: Kerby Moors M.D.   On: 10/25/2021 08:26   ECHOCARDIOGRAM COMPLETE  Result Date: 10/25/2021    ECHOCARDIOGRAM REPORT   Patient Name:   SALITA SUNDARAM Date of Exam: 10/25/2021 Medical Rec #:  [id number]           Height:       60.0 in Accession #:    [id number]          Weight:       154.2 lb Date of Birth:  [birth year]           BSA:          1.671 m Patient Age:    72 years            BP:           130/57 mmHg Patient Gender: F                   HR:           68 bpm. Exam Location:  [facility] Procedure: 2D Echo, Color Doppler, Cardiac Doppler and Intracardiac            Opacification Agent Indications:     R07.9 Chest Pain; Elevated troponin  History:         Patient has prior history of Echocardiogram examinations, most                  recent 04/02/2021. CHF; Risk Factors:Hypertension and Sleep                  Apnea.  Sonographer:     Charmayne Sheer Referring Phys:  [id number] AMY N COX Diagnosing Phys: Donnelly Angelica  Sonographer Comments: Suboptimal apical window and no subcostal window. IMPRESSIONS  1. Left ventricular ejection fraction, by estimation, is 50 to 55%. The left ventricle has low normal function. The left ventricle has no regional wall motion abnormalities. Left ventricular diastolic parameters are consistent with Grade II diastolic dysfunction (pseudonormalization).  2. Right ventricular systolic function is normal. The right ventricular size is normal.  3. Left atrial size was mildly dilated.  4. The mitral valve is normal in structure. No evidence of mitral valve regurgitation. No evidence of mitral stenosis.  5. The aortic valve is grossly normal. Aortic valve regurgitation is not visualized. No aortic stenosis is present. FINDINGS  Left Ventricle: Left ventricular ejection fraction, by estimation, is 50  to 55%. The left ventricle has low normal function. The left ventricle has no regional wall motion abnormalities. Definity contrast agent was given IV to delineate the left ventricular endocardial borders. The left ventricular internal cavity size was normal in size. There is no left ventricular hypertrophy. Left ventricular diastolic parameters are consistent with Grade II diastolic dysfunction (pseudonormalization). Right Ventricle: The right ventricular size is normal. Right vetricular wall thickness was not well visualized. Right ventricular systolic function is normal. Left Atrium: Left atrial size was mildly dilated. Right Atrium: Right atrial size was not well visualized. Pericardium: There is no evidence of pericardial effusion. Mitral Valve: The mitral  valve is normal in structure. No evidence of mitral valve regurgitation. No evidence of mitral valve stenosis. MV peak gradient, 3.3 mmHg. The mean mitral valve gradient is 2.0 mmHg. Tricuspid Valve: The tricuspid valve is not well visualized. Tricuspid valve regurgitation is not demonstrated. Aortic Valve: The aortic valve is grossly normal. Aortic valve regurgitation is not visualized. No aortic stenosis is present. Aortic valve mean gradient measures 6.0 mmHg. Aortic valve peak gradient measures 10.1 mmHg. Aortic valve area, by VTI measures  1.53 cm. Pulmonic Valve: The pulmonic valve was not well visualized. Pulmonic valve regurgitation is not visualized. Aorta: The aortic root is normal in size and structure. Venous: The inferior vena cava was not well visualized. IAS/Shunts: The interatrial septum was not well visualized.  LEFT VENTRICLE PLAX 2D LVIDd:         4.66 cm      Diastology LVIDs:         3.66 cm      LV e' medial:    5.44 cm/s LV PW:         1.16 cm      LV E/e' medial:  16.7 LV IVS:        0.75 cm      LV e' lateral:   5.87 cm/s LVOT diam:     2.00 cm      LV E/e' lateral: 15.5 LV SV:         46 LV SV Index:   27 LVOT Area:     3.14 cm  LV  Volumes (MOD) LV vol d, MOD A2C: 80.3 ml LV vol d, MOD A4C: 113.0 ml LV vol s, MOD A2C: 48.5 ml LV vol s, MOD A4C: 56.6 ml LV SV MOD A2C:     31.8 ml LV SV MOD A4C:     113.0 ml LV SV MOD BP:      45.1 ml LEFT ATRIUM             Index LA diam:        3.80 cm 2.27 cm/m LA Vol (A2C):   51.6 ml 30.87 ml/m LA Vol (A4C):   81.3 ml 48.65 ml/m LA Biplane Vol: 65.9 ml 39.43 ml/m  AORTIC VALVE                     PULMONIC VALVE AV Area (Vmax):    1.44 cm      PV Vmax:       0.86 m/s AV Area (Vmean):   1.21 cm      PV Vmean:      56.700 cm/s AV Area (VTI):     1.53 cm      PV VTI:        0.159 m AV Vmax:           159.00 cm/s   PV Peak grad:  3.0 mmHg AV Vmean:          119.000 cm/s  PV Mean grad:  1.0 mmHg AV VTI:            0.300 m AV Peak Grad:      10.1 mmHg AV Mean Grad:      6.0 mmHg LVOT Vmax:         73.10 cm/s LVOT Vmean:        45.900 cm/s LVOT VTI:          0.146 m LVOT/AV VTI ratio: 0.49  AORTA Ao Root diam: 2.50 cm MITRAL VALVE  TRICUSPID VALVE MV Area (PHT): 2.94 cm    TR Peak grad:   25.4 mmHg MV Area VTI:   1.44 cm    TR Vmax:        252.00 cm/s MV Peak grad:  3.3 mmHg MV Mean grad:  2.0 mmHg    SHUNTS MV Vmax:       0.91 m/s    Systemic VTI:  0.15 m MV Vmean:      60.2 cm/s   Systemic Diam: 2.00 cm MV Decel Time: 258 msec MV E velocity: 90.80 cm/s MV A velocity: 80.60 cm/s MV E/A ratio:  1.13 Donnelly Angelica Electronically signed by Donnelly Angelica Signature Date/Time: 10/25/2021/1:09:32 PM    Final     Microbiology: Results for orders placed or performed during the hospital encounter of 11/14/21  Resp Panel by RT-PCR (Flu A&B, Covid) Nasopharyngeal Swab     Status: None   Collection Time: 11/14/21 12:28 AM   Specimen: Nasopharyngeal Swab; Nasopharyngeal(NP) swabs in vial transport medium  Result Value Ref Range Status   SARS Coronavirus 2 by RT PCR NEGATIVE NEGATIVE Final    Comment: (NOTE) SARS-CoV-2 target nucleic acids are NOT DETECTED.  The SARS-CoV-2 RNA is generally detectable in  upper respiratory specimens during the acute phase of infection. The lowest concentration of SARS-CoV-2 viral copies this assay can detect is 138 copies/mL. A negative result does not preclude SARS-Cov-2 infection and should not be used as the sole basis for treatment or other patient management decisions. A negative result may occur with  improper specimen collection/handling, submission of specimen other than nasopharyngeal swab, presence of viral mutation(s) within the areas targeted by this assay, and inadequate number of viral copies(<138 copies/mL). A negative result must be combined with clinical observations, patient history, and epidemiological information. The expected result is Negative.  Fact Sheet for Patients:  EntrepreneurPulse.com.au  Fact Sheet for Healthcare Providers:  IncredibleEmployment.be  This test is no t yet approved or cleared by the [place] FDA and  has been authorized for detection and/or diagnosis of SARS-CoV-2 by FDA under an Emergency Use Authorization (EUA). This EUA will remain  in effect (meaning this test can be used) for the duration of the COVID-19 declaration under Section 564(b)(1) of the Act, 21 U.S.C.section 360bbb-3(b)(1), unless the authorization is terminated  or revoked sooner.       Influenza A by PCR NEGATIVE NEGATIVE Final   Influenza B by PCR NEGATIVE NEGATIVE Final    Comment: (NOTE) The Xpert Xpress SARS-CoV-2/FLU/RSV plus assay is intended as an aid in the diagnosis of influenza from Nasopharyngeal swab specimens and should not be used as a sole basis for treatment. Nasal washings and aspirates are unacceptable for Xpert Xpress SARS-CoV-2/FLU/RSV testing.  Fact Sheet for Patients: EntrepreneurPulse.com.au  Fact Sheet for Healthcare Providers: IncredibleEmployment.be  This test is not yet approved or cleared by the [place] FDA and has been  authorized for detection and/or diagnosis of SARS-CoV-2 by FDA under an Emergency Use Authorization (EUA). This EUA will remain in effect (meaning this test can be used) for the duration of the COVID-19 declaration under Section 564(b)(1) of the Act, 21 U.S.C. section 360bbb-3(b)(1), unless the authorization is terminated or revoked.  Performed at [facility], [address] [place], [place] [address]   Blood Culture (routine x 2)     Status: None (Preliminary result)   Collection Time: 11/14/21 10:32 PM   Specimen: BLOOD  Result Value Ref Range Status   Specimen Description BLOOD  RIGHT ANTECUBITAL  Final   Special Requests   Final    BOTTLES DRAWN AEROBIC AND ANAEROBIC Blood Culture adequate volume   Culture   Final    NO GROWTH 4 DAYS Performed at [facility], [address] [place], [place] [address]    Report Status PENDING  Incomplete  Blood Culture (routine x 2)     Status: None (Preliminary result)   Collection Time: 11/14/21 10:32 PM   Specimen: BLOOD  Result Value Ref Range Status   Specimen Description BLOOD BLOOD RIGHT HAND  Final   Special Requests   Final    BOTTLES DRAWN AEROBIC AND ANAEROBIC Blood Culture adequate volume   Culture   Final    NO GROWTH 4 DAYS Performed at [facility], [address] [place], [place] [address]    Report Status PENDING  Incomplete  Urine Culture     Status: None   Collection Time: 11/15/21  5:00 AM   Specimen: In/Out Cath Urine  Result Value Ref Range Status   Specimen Description   Final    IN/OUT CATH URINE Performed at [facility], [address] [place], [place] [address]    Special Requests   Final    NONE Performed at [facility], [address], [place], [place] [address]    Culture   Final    NO GROWTH Performed at [facility], [place] [address] [place],  [address]    Report Status 11/16/2021 FINAL  Final    Labs: CBC: Recent Labs  Lab  11/14/21 2232 11/15/21 0639 11/16/21 0647  WBC 8.2 8.5 6.1  NEUTROABS 4.8  --   --   HGB 15.3* 12.8 13.7  HCT 46.3* 38.6 40.6  MCV 92.4 91.7 91.4  PLT 317 237 [id number]   Basic Metabolic Panel: Recent Labs  Lab 11/14/21 2232 11/15/21 0755 11/16/21 0647  NA 139  --  139  K 4.3  --  4.3  CL 104  --  107  CO2 25  --  27  GLUCOSE 156*  --  98  BUN 35*  --  22  CREATININE 1.61* 1.65* 0.82  CALCIUM 10.5*  --  9.6   Liver Function Tests: Recent Labs  Lab 11/14/21 2232  AST 20  ALT 13  ALKPHOS 50  BILITOT 1.1  PROT 6.8  ALBUMIN 3.6   CBG: Recent Labs  Lab 11/15/21 0638 11/15/21 1112 11/15/21 1632 11/16/21 0821 11/16/21 1241  GLUCAP 99 102* 89 92 123*    Discharge time spent: greater than 30 minutes.  Signed: Max Sane, MD [facility] 11/18/2021

## 2021-11-19 LAB — CULTURE, BLOOD (ROUTINE X 2)
Culture: NO GROWTH
Culture: NO GROWTH
Special Requests: ADEQUATE
Special Requests: ADEQUATE

## 2021-12-03 ENCOUNTER — Other Ambulatory Visit: Payer: Self-pay | Admitting: Student in an Organized Health Care Education/Training Program

## 2021-12-13 ENCOUNTER — Telehealth: Payer: Self-pay

## 2021-12-13 NOTE — Telephone Encounter (Signed)
Attempted to contact patient's niece Efraim KaufmannMelissa to schedule a Palliative Care consult appointment. No answer left a message to return call.

## 2022-01-17 ENCOUNTER — Telehealth: Payer: Self-pay | Admitting: Student

## 2022-01-17 NOTE — Telephone Encounter (Signed)
Attempted to contact patient's sister Claris GowerCharlotte to schedule Consult, no answer and no VM set up.   ? ?I then tried to reach Niece Melissa, no answer - left message with reason for call and requested a return call to schedule Consult or to let us know if they do not wish to pursue Palliative services. ?

## 2022-01-18 ENCOUNTER — Telehealth: Payer: Self-pay

## 2022-01-18 NOTE — Telephone Encounter (Unsigned)
Spoke with patient and scheduled telephonic visit with palliative NP for 01/25/22 at 1:30 ?

## 2022-01-18 NOTE — Telephone Encounter (Signed)
Called patient and sister to schedule palliative appt.  Patient has appt today and could not schedule. She will call back. Will attempt 1 more time to schedule palliative appt ?

## 2022-01-25 ENCOUNTER — Other Ambulatory Visit: Payer: Medicare Other | Admitting: Student

## 2022-01-25 DIAGNOSIS — R531 Weakness: Secondary | ICD-10-CM

## 2022-01-25 DIAGNOSIS — R0602 Shortness of breath: Secondary | ICD-10-CM

## 2022-01-25 DIAGNOSIS — Z515 Encounter for palliative care: Secondary | ICD-10-CM

## 2022-01-26 NOTE — Progress Notes (Signed)
? ? ?Manufacturing engineer ?Community Palliative Care Consult Note ?Telephone: [phone]  ?Fax: [phone]  ? ?Date of encounter: 01/25/2022 ?1:42 PM ?PATIENT NAME: Andrea Phillips ?[address] ?[place] [address]   ?[phone] (home)  ?DOB: Jul 26, 1935 ?MRN: [id number] ?PRIMARY CARE PROVIDER:    ?Andrea Maltese, MD,  ?[place] ?[place] [address] ?[phone] ? ?REFERRING PROVIDER:   ?Andrea Maltese, MD ?[address] ?[place],  [place] [address] ?[phone] ? ?RESPONSIBLE PARTY:    ?Contact Information   ? ? Name Relation Home Work Mobile  ? Clark,Charlotte Sister [phone]  [phone]  ? Hobgood,Melissa Niece   [phone]  ? Sowers,Karen Niece   [phone]  ? Corliss Marcus Granddaughter   [phone]  ? Gera, Stater   [phone]  ? ?  ? ? ?Due to the COVID-19 crisis, this home visit was done via telephone due to the patient's inability to connect via an audiovisual connection or their refusal to have an in-person visit. This connection was agreed to by the patient. Verified that I am speaking with the correct person using two identifiers. ? ?                                 ASSESSMENT AND PLAN / RECOMMENDATIONS:  ? ?Advance Care Planning/Goals of Care: Goals include to maximize quality of life and symptom management. Patient/health care surrogate gave his/her permission to discuss.Our advance care planning conversation included a discussion about:    ?The value and importance of advance care planning  ?Experiences with loved ones who have been seriously ill or have died  ?Exploration of personal, cultural or spiritual beliefs that might influence medical decisions  ?Exploration of goals of care in the event of a sudden injury or illness  ?CODE STATUS: Full Code; attempt CPR, open to ventilation for up to 2 days.  ? ?Education provided on palliative medicine. Will continue to provide symptom management and ongoing support. Discussed wishes; no feeding  tube. ? ?Symptom Management/Plan: ? ?Generalized Weakness-patient completed therapy in the past 2 weeks. She feels she is back to her previous baseline. Continue use of cane for ambulation.  ? ?Shortness of breath-due to COPD, HF. Continue Dulera BID, albuterol PRN, furosemide as directed, CPAP QHS. Monitor for worsening symptoms.  ? ?Follow up Palliative Care Visit: Palliative care will continue to follow for complex medical decision making, advance care planning, and clarification of goals. Return in 8 weeks or prn. ? ? ?This visit was coded based on medical decision making (MDM). ? ? ?HOSPICE ELIGIBILITY/DIAGNOSIS: TBD ? ?Chief Complaint: Palliative Medicine initial consult.  ? ?HISTORY OF PRESENT ILLNESS:  Andrea Phillips is a 87 y.o. year old female  with COPD, chronic systolic heart failure, syncope & collapse, hypertension, hyperlipidemia, sleep apnea, hypothyroidism. Most recent hospitalization 1/22-1/24/23 due to syncope and collapse, AKI. ? ?Patient resides at home. She completed therapy two weeks ago. She is back at her previous baseline; uses cane for ambulation. No falls, no syncopal or near syncopal episodes reported. She denies pain, nausea, constipation. She does endorse shortness of breath with exertion. She wears CPAP at night. Endorses a fair appetite; weight has been stable. Sleeping well at night.  ? ?History obtained from review of EMR, discussion with primary team, and interview with family, facility staff/caregiver and/or [name].  ?I reviewed available labs, medications, imaging, studies and related documents from the EMR.  Records reviewed and summarized above.  ? ?  ROS ? ?General: NAD ?EYES: denies vision changes ?ENMT: denies dysphagia ?Cardiovascular: denies chest pain, DOE ?Pulmonary: denies cough, denies increased SOB ?Abdomen: endorses fair appetite, denies constipation, endorses continence of bowel ?GU: denies dysuria, endorses continence of urine ?MSK:  denies increased  weakness,  no falls reported ?Skin: denies rashes or wounds ?Neurological: denies pain, denies insomnia ?Psych: Endorses positive mood ?Heme/lymph/immuno: denies bruises, abnormal bleeding ? ?Physical Exam: ? ?PE deferred due to this being a telephonic visit.  ? ?CURRENT PROBLEM LIST:  ?Patient Active Problem List  ? Diagnosis Date Noted  ? Hypotension   ? Elevated lactic acid level   ? AKI (acute kidney injury) ([facility]) 11/15/2021  ? Diarrhea 11/15/2021  ? Chest pain   ? H/O medication noncompliance 10/24/2021  ? CAP (community acquired pneumonia) 08/31/2021  ? Multifocal pneumonia 08/29/2021  ? Acute on chronic systolic CHF (congestive heart failure) ([place]) 04/02/2021  ? Hypothyroidism 04/02/2021  ? Syncope and collapse 04/07/2020  ? Right hip pain 04/07/2020  ? Frequent PVCs 04/07/2020  ? OSA (obstructive sleep apnea) 04/07/2020  ? Elevated troponin 04/07/2020  ? Varicose veins of both lower extremities with pain 04/04/2017  ? Essential hypertension 04/04/2017  ? Hyperlipidemia, mixed 11/22/2006  ? ?PAST MEDICAL HISTORY:  ?Active Ambulatory Problems  ?  Diagnosis Date Noted  ? Varicose veins of both lower extremities with pain 04/04/2017  ? Essential hypertension 04/04/2017  ? Syncope and collapse 04/07/2020  ? Right hip pain 04/07/2020  ? Frequent PVCs 04/07/2020  ? OSA (obstructive sleep apnea) 04/07/2020  ? Elevated troponin 04/07/2020  ? Acute on chronic systolic CHF (congestive heart failure) ([place]) 04/02/2021  ? Hypothyroidism 04/02/2021  ? Hyperlipidemia, mixed 11/22/2006  ? Multifocal pneumonia 08/29/2021  ? CAP (community acquired pneumonia) 08/31/2021  ? H/O medication noncompliance 10/24/2021  ? Chest pain   ? AKI (acute kidney injury) ([place]) 11/15/2021  ? Diarrhea 11/15/2021  ? Hypotension   ? Elevated lactic acid level   ? ?Resolved Ambulatory Problems  ?  Diagnosis Date Noted  ? NSTEMI (non-ST elevated myocardial infarction) ([place]) 04/02/2021  ? Severe sepsis ([place]) 11/15/2021  ? ?Past Medical History:   ?Diagnosis Date  ? Arthritis   ? Asthma   ? CHF (congestive heart failure) ([place])   ? Dysrhythmia   ? Edema extremities   ? GERD (gastroesophageal reflux disease)   ? Gout   ? Headache   ? History of hiatal hernia   ? Hypertension   ? Shortness of breath dyspnea   ? Sleep apnea   ? ?SOCIAL HX:  ?Social History  ? ?Tobacco Use  ? Smoking status: Never  ? Smokeless tobacco: Never  ?Substance Use Topics  ? Alcohol use: No  ? ?FAMILY HX:  ?Family History  ?Problem Relation Age of Onset  ? Heart disease Mother   ? Heart disease Father   ? Breast cancer Cousin   ?   ? ?ALLERGIES:  ?Allergies  ?Allergen Reactions  ? Aspirin Hives  ? Other Hives  ?  Yellow dye 6 ?FOOD COLOR YELLOW POWDER ?  ? Atorvastatin Other (See Comments)  ?  unknown  ? Codeine Other (See Comments)  ?  Doesn't know  ? Conj Estrog-Medroxyprogest Ace Other (See Comments)  ?  PREMPRO 0.3-1.5 MG ORAL TABLET ?unknown  ? Prednisone Other (See Comments)  ?  Chest pain  ? Raloxifene Other (See Comments)  ?  EVISTA 60 MG ORAL TABLET ?unknown  ? Ace Inhibitors Cough  ? Amlodipine Itching  ? Valsartan  Itching  ?   ?PERTINENT MEDICATIONS:  ?Outpatient Encounter Medications as of 01/25/2022  ?Medication Sig  ? albuterol (VENTOLIN HFA) 108 (90 Base) MCG/ACT inhaler Inhale 2 puffs into the lungs every 6 (six) hours as needed for wheezing or shortness of breath.  ? cetirizine (ZYRTEC) 10 MG tablet Take 10 mg by mouth daily.  ? fluticasone (FLONASE) 50 MCG/ACT nasal spray Place 1 spray into both nostrils 2 (two) times daily as needed for allergies or rhinitis.  ? furosemide (LASIX) 20 MG tablet Take 1 tablet (20 mg total) by mouth daily.  ? isosorbide mononitrate (IMDUR) 30 MG 24 hr tablet Take 1 tablet (30 mg total) by mouth daily.  ? levothyroxine (SYNTHROID) 112 MCG tablet Take 112 mcg by mouth daily.  ? magnesium oxide (MAG-OX) 400 MG tablet Take 400 mg by mouth daily.  ? mometasone-formoterol (DULERA) 200-5 MCG/ACT AERO Inhale 2 puffs into the lungs 2 (two) times  daily.  ? omeprazole (PRILOSEC OTC) 20 MG tablet Take 20 mg by mouth daily.  ? pravastatin (PRAVACHOL) 20 MG tablet Take 1 tablet (20 mg total) by mouth daily at 6 PM.  ? telmisartan (MICARDIS) 80 MG tablet Take

## 2022-02-08 ENCOUNTER — Emergency Department: Payer: Medicare Other

## 2022-02-08 ENCOUNTER — Inpatient Hospital Stay
Admission: EM | Admit: 2022-02-08 | Discharge: 2022-02-11 | DRG: 065 | Disposition: A | Discharge status: Home Health | Payer: Medicare Other | Attending: Internal Medicine | Admitting: Internal Medicine

## 2022-02-08 DIAGNOSIS — I248 Other forms of acute ischemic heart disease: Secondary | ICD-10-CM | POA: Diagnosis present

## 2022-02-08 DIAGNOSIS — Z886 Allergy status to analgesic agent status: Secondary | ICD-10-CM | POA: Diagnosis not present

## 2022-02-08 DIAGNOSIS — K219 Gastro-esophageal reflux disease without esophagitis: Secondary | ICD-10-CM | POA: Diagnosis present

## 2022-02-08 DIAGNOSIS — I2510 Atherosclerotic heart disease of native coronary artery without angina pectoris: Secondary | ICD-10-CM | POA: Diagnosis present

## 2022-02-08 DIAGNOSIS — Z79890 Hormone replacement therapy (postmenopausal): Secondary | ICD-10-CM | POA: Diagnosis not present

## 2022-02-08 DIAGNOSIS — I4892 Unspecified atrial flutter: Secondary | ICD-10-CM | POA: Diagnosis present

## 2022-02-08 DIAGNOSIS — N179 Acute kidney failure, unspecified: Secondary | ICD-10-CM | POA: Diagnosis present

## 2022-02-08 DIAGNOSIS — Z79899 Other long term (current) drug therapy: Secondary | ICD-10-CM

## 2022-02-08 DIAGNOSIS — I5022 Chronic systolic (congestive) heart failure: Secondary | ICD-10-CM | POA: Diagnosis present

## 2022-02-08 DIAGNOSIS — M109 Gout, unspecified: Secondary | ICD-10-CM | POA: Diagnosis present

## 2022-02-08 DIAGNOSIS — Z20822 Contact with and (suspected) exposure to covid-19: Secondary | ICD-10-CM | POA: Diagnosis present

## 2022-02-08 DIAGNOSIS — E039 Hypothyroidism, unspecified: Secondary | ICD-10-CM | POA: Diagnosis present

## 2022-02-08 DIAGNOSIS — Z803 Family history of malignant neoplasm of breast: Secondary | ICD-10-CM | POA: Diagnosis not present

## 2022-02-08 DIAGNOSIS — E782 Mixed hyperlipidemia: Secondary | ICD-10-CM | POA: Diagnosis present

## 2022-02-08 DIAGNOSIS — Z8249 Family history of ischemic heart disease and other diseases of the circulatory system: Secondary | ICD-10-CM

## 2022-02-08 DIAGNOSIS — I110 Hypertensive heart disease with heart failure: Secondary | ICD-10-CM | POA: Diagnosis present

## 2022-02-08 DIAGNOSIS — G4730 Sleep apnea, unspecified: Secondary | ICD-10-CM | POA: Insufficient documentation

## 2022-02-08 DIAGNOSIS — G4733 Obstructive sleep apnea (adult) (pediatric): Secondary | ICD-10-CM | POA: Diagnosis present

## 2022-02-08 DIAGNOSIS — R4781 Slurred speech: Secondary | ICD-10-CM | POA: Diagnosis present

## 2022-02-08 DIAGNOSIS — I63432 Cerebral infarction due to embolism of left posterior cerebral artery: Principal | ICD-10-CM | POA: Diagnosis present

## 2022-02-08 DIAGNOSIS — I639 Cerebral infarction, unspecified: Secondary | ICD-10-CM | POA: Diagnosis not present

## 2022-02-08 DIAGNOSIS — G9349 Other encephalopathy: Secondary | ICD-10-CM | POA: Diagnosis present

## 2022-02-08 DIAGNOSIS — I10 Essential (primary) hypertension: Secondary | ICD-10-CM | POA: Diagnosis present

## 2022-02-08 DIAGNOSIS — I480 Paroxysmal atrial fibrillation: Secondary | ICD-10-CM | POA: Diagnosis present

## 2022-02-08 DIAGNOSIS — I4891 Unspecified atrial fibrillation: Secondary | ICD-10-CM | POA: Diagnosis present

## 2022-02-08 DIAGNOSIS — G8191 Hemiplegia, unspecified affecting right dominant side: Secondary | ICD-10-CM | POA: Diagnosis present

## 2022-02-08 DIAGNOSIS — Z7951 Long term (current) use of inhaled steroids: Secondary | ICD-10-CM

## 2022-02-08 DIAGNOSIS — R531 Weakness: Secondary | ICD-10-CM | POA: Diagnosis not present

## 2022-02-08 DIAGNOSIS — R29707 NIHSS score 7: Secondary | ICD-10-CM | POA: Diagnosis present

## 2022-02-08 LAB — CBC WITH DIFFERENTIAL/PLATELET
Abs Immature Granulocytes: 0.02 10*3/uL (ref 0.00–0.07)
Basophils Absolute: 0 10*3/uL (ref 0.0–0.1)
Basophils Relative: 0 %
Eosinophils Absolute: 0.3 10*3/uL (ref 0.0–0.5)
Eosinophils Relative: 3 %
HCT: 41.7 % (ref 36.0–46.0)
Hemoglobin: 13.8 g/dL (ref 12.0–15.0)
Immature Granulocytes: 0 %
Lymphocytes Relative: 32 %
Lymphs Abs: 2.9 10*3/uL (ref 0.7–4.0)
MCH: 30.6 pg (ref 26.0–34.0)
MCHC: 33.1 g/dL (ref 30.0–36.0)
MCV: 92.5 fL (ref 80.0–100.0)
Monocytes Absolute: 0.9 10*3/uL (ref 0.1–1.0)
Monocytes Relative: 10 %
Neutro Abs: 5 10*3/uL (ref 1.7–7.7)
Neutrophils Relative %: 55 %
Platelets: 280 10*3/uL (ref 150–400)
RBC: 4.51 MIL/uL (ref 3.87–5.11)
RDW: 12.7 % (ref 11.5–15.5)
WBC: 9.1 10*3/uL (ref 4.0–10.5)
nRBC: 0 % (ref 0.0–0.2)

## 2022-02-08 LAB — COMPREHENSIVE METABOLIC PANEL
ALT: 15 U/L (ref 0–44)
AST: 17 U/L (ref 15–41)
Albumin: 3.6 g/dL (ref 3.5–5.0)
Alkaline Phosphatase: 60 U/L (ref 38–126)
Anion gap: 8 (ref 5–15)
BUN: 27 mg/dL — ABNORMAL HIGH (ref 8–23)
CO2: 26 mmol/L (ref 22–32)
Calcium: 9.8 mg/dL (ref 8.9–10.3)
Chloride: 100 mmol/L (ref 98–111)
Creatinine, Ser: 1.15 mg/dL — ABNORMAL HIGH (ref 0.44–1.00)
GFR, Estimated: 47 mL/min — ABNORMAL LOW (ref >60)
Glucose, Bld: 95 mg/dL (ref 70–99)
Potassium: 4.4 mmol/L (ref 3.5–5.1)
Sodium: 134 mmol/L — ABNORMAL LOW (ref 135–145)
Total Bilirubin: 0.8 mg/dL (ref 0.3–1.2)
Total Protein: 6.4 g/dL — ABNORMAL LOW (ref 6.5–8.1)

## 2022-02-08 LAB — APTT: aPTT: 32 seconds (ref 24–36)

## 2022-02-08 LAB — RESP PANEL BY RT-PCR (FLU A&B, COVID) ARPGX2
Influenza A by PCR: NEGATIVE
Influenza B by PCR: NEGATIVE
SARS Coronavirus 2 by RT PCR: NEGATIVE

## 2022-02-08 LAB — PROTIME-INR
INR: 1 (ref 0.8–1.2)
Prothrombin Time: 13.2 seconds (ref 11.4–15.2)

## 2022-02-08 LAB — ETHANOL: Alcohol, Ethyl (B): <10 mg/dL (ref <10)

## 2022-02-08 LAB — TSH: TSH: 1.281 u[IU]/mL (ref 0.350–4.500)

## 2022-02-08 LAB — MAGNESIUM: Magnesium: 2.2 mg/dL (ref 1.7–2.4)

## 2022-02-08 LAB — TROPONIN I (HIGH SENSITIVITY): Troponin I (High Sensitivity): 22 ng/L — ABNORMAL HIGH (ref <18)

## 2022-02-08 LAB — PROCALCITONIN: Procalcitonin: <0.1 ng/mL

## 2022-02-08 LAB — CBG MONITORING, ED: Glucose-Capillary: 99 mg/dL (ref 70–99)

## 2022-02-08 MED ORDER — LACTATED RINGERS IV BOLUS
1000.0000 mL | Freq: Once | INTRAVENOUS | Status: AC
  Administered 2022-02-08: 1000 mL via INTRAVENOUS

## 2022-02-08 MED ORDER — IOHEXOL 350 MG/ML SOLN
100.0000 mL | Freq: Once | INTRAVENOUS | Status: AC | PRN
  Administered 2022-02-08: 100 mL via INTRAVENOUS

## 2022-02-08 NOTE — ED Notes (Signed)
Report received from Matt,RN

## 2022-02-08 NOTE — Assessment & Plan Note (Addendum)
Creatinine was 1.15 up from recent baseline of 0.9 . ?-Monitor renal function ?-Avoid nephrotoxins ?

## 2022-02-08 NOTE — Progress Notes (Signed)
Dr. [place]ville Silos made aware of CTA of head/neck and CTP results ?

## 2022-02-08 NOTE — ED Notes (Signed)
Patient transported to CT 

## 2022-02-08 NOTE — Assessment & Plan Note (Addendum)
EF of 50 to 55% in January 2023 up from prior of 30 to 35% ?Currently euvolemic ?Hold telmisartan metoprolol and furosemide for permissive hypertension ?

## 2022-02-08 NOTE — Assessment & Plan Note (Addendum)
Recommendations per neurology: ?? ?    ?  Stroke/Telemetry Floor ?    ?  Neuro Checks ?    ?  Bedside Swallow Eval ?    ?  DVT Prophylaxis ?    ?  IV Fluids, Normal Saline ?    ?  Head of Bed 30 Degrees ?    ?  Euglycemia and Avoid Hyperthermia (PRN Acetaminophen) ?    ?  Initiate Clopidogrel 75 mg daily ?    ?  Antihypertensives PRN if Blood pressure is greater than 220/120 or there is a concern for End organ damage/contraindications for permissive HTN. If blood pressure is greater than 220/120 give labetalol PO or IV or Vasotec IV with a goal of 15% reduction in BP during the first 24 hours. ?    ?  - MRI Brain without contrast-still pending ?    ?  - TTE (Had echo January 2023 showing improved EF to 55% from 35%)-done with pending results ?    ?  - Labs: lipid panel; HgbA1c-within normal limit ?    ?  - PT/OT/ST recommending rehab ? ? ?

## 2022-02-08 NOTE — Progress Notes (Signed)
2135-Code Stroke activated ?Pt has already been to CT and back in the room when I joined teleneuro cart ?2140-Dr. Douyon joined teleneuro cart ?

## 2022-02-08 NOTE — ED Provider Notes (Signed)
? ?[facility] ?Provider Note ? ? ? Event Date/Time  ? First MD Initiated Contact with Patient 02/08/22 2104   ?  (approximate) ? ? ?History  ? ?Chief Complaint ?Code Stroke ? ? ?HPI ? ?Andrea Phillips is a 87 y.o. female with past medical history of hypertension, hyperlipidemia, CAD, CHF, and hypothyroidism who presents to the ED for altered mental status.  History is limited as patient is having a difficult time answering questions on arrival.  Per EMS, she had set up from dinner around 6 PM and told family that she was "feeling unwell."  Family noted patient appeared much weaker than usual and was "not acting right."  She went to lay down in her room but condition seemed to worsen and family eventually called EMS.  EMS reports that patient had a hard time following commands, complained only of feeling weak and "sick."  EMS notes possible right-sided weakness on their stroke assessment. ?  ? ? ?Physical Exam  ? ?Triage Vital Signs: ?ED Triage Vitals  ?Enc Vitals Group  ?   BP   ?   Pulse   ?   Resp   ?   Temp   ?   Temp src   ?   SpO2   ?   Weight   ?   Height   ?   Head Circumference   ?   Peak Flow   ?   Pain Score   ?   Pain Loc   ?   Pain Edu?   ?   Excl. in GC?   ? ? ?Most recent vital signs: ?Vitals:  ? 02/08/22 2230 02/08/22 2300  ?BP: (!) 159/69 (!) 110/38  ?Pulse: 75 78  ?Resp: 14 13  ?Temp:    ?SpO2: 98% 96%  ? ? ?Constitutional: Somnolent but arousable to voice.  Oriented to person, place, time, and situation. ?Eyes: Conjunctivae are normal.  Pupils equal, round, and reactive to light bilaterally. ?Head: Atraumatic. ?Nose: No congestion/rhinnorhea. ?Mouth/Throat: Mucous membranes are moist.  ?Cardiovascular: Normal rate, regular rhythm. Grossly normal heart sounds.  2+ radial pulses bilaterally. ?Respiratory: Normal respiratory effort.  No retractions. Lungs CTAB. ?Gastrointestinal: Soft and nontender. No distention. ?Musculoskeletal: No lower extremity tenderness nor edema.   ?Neurologic:  Normal speech and language.  4 out of 5 strength in right upper and right lower extremities, 5 out of 5 strength in left upper and lower extremities. ? ? ? ?ED Results / Procedures / Treatments  ? ?Labs ?(all labs ordered are listed, but only abnormal results are displayed) ?Labs Reviewed  ?COMPREHENSIVE METABOLIC PANEL - Abnormal; Notable for the following components:  ?    Result Value  ? Sodium 134 (*)   ? BUN 27 (*)   ? Creatinine, Ser 1.15 (*)   ? Total Protein 6.4 (*)   ? GFR, Estimated 47 (*)   ? All other components within normal limits  ?TROPONIN I (HIGH SENSITIVITY) - Abnormal; Notable for the following components:  ? Troponin I (High Sensitivity) 22 (*)   ? All other components within normal limits  ?RESP PANEL BY RT-PCR (FLU A&B, COVID) ARPGX2  ?ETHANOL  ?PROTIME-INR  ?APTT  ?CBC WITH DIFFERENTIAL/PLATELET  ?PROCALCITONIN  ?TSH  ?MAGNESIUM  ?DIFFERENTIAL  ?URINE DRUG SCREEN, QUALITATIVE ([facility] ONLY)  ?URINALYSIS, ROUTINE W REFLEX MICROSCOPIC  ?PROCALCITONIN  ?CBG MONITORING, ED  ?TROPONIN I (HIGH SENSITIVITY)  ? ? ?RADIOLOGY ?CT head reviewed by me with no hemorrhage or midline shift. ? ?PROCEDURES: ? ?  Critical Care performed: No ? ?.Critical Care ?Performed by: Chesley Noon, MD ?Authorized by: Chesley Noon, MD  ? ?Critical care provider statement:  ?  Critical care time (minutes):  45 ?  Critical care time was exclusive of:  Separately billable procedures and treating other patients and teaching time ?  Critical care was necessary to treat or prevent imminent or life-threatening deterioration of the following conditions:  CNS failure or compromise ?  Critical care was time spent personally by me on the following activities:  Development of treatment plan with patient or surrogate, discussions with consultants, evaluation of patient's response to treatment, examination of patient, ordering and review of laboratory studies, ordering and review of radiographic studies, ordering and  performing treatments and interventions, pulse oximetry, re-evaluation of patient's condition and review of old charts ?  I assumed direction of critical care for this patient from another provider in my specialty: no   ?  Care discussed with: admitting provider   ? ? ?MEDICATIONS ORDERED IN ED: ?Medications  ?iohexol (OMNIPAQUE) 350 MG/ML injection 100 mL (100 mLs Intravenous Contrast Given 02/08/22 2240)  ?lactated ringers bolus 1,000 mL (1,000 mLs Intravenous New Bag/Given 02/08/22 2302)  ? ? ? ?IMPRESSION / MDM / ASSESSMENT AND PLAN / ED COURSE  ?I reviewed the triage vital signs and the nursing notes. ?             ?               ? ?87 y.o. female with past medical history of hypertension, hyperlipidemia, CAD, CHF, and hypothyroidism who presents to the ED complaining of acute onset weakness and malaise at 6 PM this evening. ? ?Differential diagnosis includes, but is not limited to, stroke, TIA, sepsis, ACS, pneumonia, UTI, electrolyte abnormality, hypoglycemia, arrhythmia. ? ?Patient's somnolent but arousable to voice, is able to answer orientation questions but has a hard time following commands.  She does appear weaker on the right compared to the left with significant drifting in both right-sided extremities.  Given acute onset with focal neurologic deficit, code stroke was activated.  CT head is negative for acute process and patient awaiting teleneurologist evaluation at this time.  I would also consider sepsis or infectious process given her generalized malaise, although vital signs are reassuring. ? ?Patient evaluated by teleneurologist, who had extensive discussion with family regarding possible TNK administration.  Family eventually opted against TNK given signs of improving symptoms.  CTA was done and negative for large vessel occlusion, CT perfusion does show evolving left PCA infarct but unfortunately patient is now outside the window for TNK as it has been 5 hours since onset of symptoms.  Patient  unfortunately has allergy to aspirin.  Labs are unremarkable, CBC without anemia or leukocytosis, procalcitonin is undetectable and I doubt infectious etiology.  Troponin mildly elevated but this is similar to previous and low suspicion for ACS, BMP without AKI or electrolyte abnormality.  Case discussed with hospitalist for admission for further stroke work-up. ? ?  ? ? ?FINAL CLINICAL IMPRESSION(S) / ED DIAGNOSES  ? ?Final diagnoses:  ?Acute ischemic stroke (HCC)  ? ? ? ?Rx / DC Orders  ? ?ED Discharge Orders   ? ? None  ? ?  ? ? ? ?Note:  This document was prepared using Dragon voice recognition software and may include unintentional dictation errors. ?  ?Chesley Noon, MD ?02/08/22 2319 ? ?

## 2022-02-08 NOTE — ED Notes (Signed)
Teleneuro performing assessment ?

## 2022-02-08 NOTE — ED Triage Notes (Signed)
Pt BIBA for weakness and "not acting right". Per EMS, pt was LKWT 1800 when family states she went to just lay down in her room and was complaining of feeling weak. Family also states pt has "not been feeling well" for days. All poor historians. EMS states pt would not follow basic commands for NIHSS. Pt a/ox4, must be encouraged repeatedly to get to follow commands.  ?

## 2022-02-08 NOTE — Progress Notes (Signed)
Chaplain responded code stroke. Prayer and emotional support. ?

## 2022-02-08 NOTE — ED Notes (Signed)
Called [facility] to Inititate Code Stroke @2118  spoke to Selena BattenKim who will activate page ?

## 2022-02-08 NOTE — Consult Note (Signed)
[facility] ?[facility] Tele[facility]urology Consult Services ? ? ?Patient Name:   Andrea Phillips, Andrea Phillips ?Date of Birth:   26-Dec-1934 ?Identification Number:   MRN - [id number] ?Date of Service:   02/08/2022 21:37:15 ? ?Diagnosis: ?      I63.9 - Cerebrovascular accident (CVA), unspecified mechanism ([place]) ? ?Impression: ?     Andrea Phillips is a 87 year-old woman with a PMH of CHF, HLD, HTN, Sleep Apnea, GERD, Hypothyroidism who was BIBEMS for speech disturbance and right sided weakness. TNK was not given as her symptoms improved and her family was leaning towards conversative management without TNK. ? ?Our recommendations are outlined below. ? ?Recommendations: ? ?      Stroke/Telemetry Floor ?      Neuro Checks ?      Bedside Swallow Eval ?      DVT Prophylaxis ?      IV Fluids, Normal Saline ?      Head of Bed 30 Degrees ?      Euglycemia and Avoid Hyperthermia (PRN Acetaminophen) ?      Initiate Clopidogrel 75 mg daily ?      Antihypertensives PRN if Blood pressure is greater than 220/120 or there is a concern for End organ damage/contraindications for permissive HTN. If blood pressure is greater than 220/120 give labetalol PO or IV or Vasotec IV with a goal of 15% reduction in BP during the first 24 hours. ?      - MRI Brain without contrast ?      - TTE ?      - Labs: lipid panel; HgbA1c ?      - PT/OT/ST where applicable ? ? ?Sign Out: ?      Discussed with Emergency Department Provider ? ? ? ?------------------------------------------------------------------------------ ? ?Advanced Imaging: ?CTA Head and Neck Completed. ? ?CTP Completed. ? ?LVO:No ? ?Patient doesn't meet criteria for emergent NIR consideration ? ? ?Metrics: ?Last Known Well: 02/08/2022 18:30:00 ?[facility] Notification Time: 02/08/2022 21:37:15 ?Arrival Time: 02/08/2022 21:03:00 ?Stamp Time: 02/08/2022 21:37:15 ?Initial Response Time: 02/08/2022 21:40:02 ?Symptoms: Speech disturbance and right sided weakness. ?NIHSS Start Assessment Time:  02/08/2022 21:45:16 ?Patient is not a candidate for Thrombolytic. ?Thrombolytic Medical Decision: 02/08/2022 22:24:48 ?Patient was not deemed candidate for Thrombolytic because of following reasons: ?Patient symptoms are improving and family was reluctant about TNK and were leaning towards a more conservative approach. ? ?CT head showed no acute hemorrhage or acute core infarct. ?I personally Reviewed the CT Head and it Showed ? ?Primary Provider Notified of Diagnostic Impression and Management Plan on: 02/08/2022 22:38:16 ? ? ? ?------------------------------------------------------------------------------ ? ?Additional Comments: ? ?   More extensive discussion on the management decision was requested by patient or family ? ?History of Present Illness: ?Patient is a 87 year old Female. ? ?Patient was brought by EMS for symptoms of Speech disturbance and right sided weakness. ?Andrea Phillips is a 87 year-old woman with a PMH of CHF, HLD, HTN, Sleep Apnea, GERD, Hypothyroidism who was BIBEMS for speech disturbance and right sided weakness. I called her grand-daughter, Corliss Marcus [phone]) who later presented to the bedside. Andrea Phillips reports that her grandmother had a normal day and was doing fine. She states that they ate dinner together. She reports that this evening Andrea Phillips got up to play with her 49 year-old great grandchild. She bent over to hold his hand to walk with him and she suddenly stumbled towards the wall. She states that her breathing slowed, she wasn't answering questions the same way, and  her speech was mumbled. She also noticed that she appeard weaker on the right side. ?Upon initial evaluation, Andrea Phillips's NIHSS was 7. I disucssed with Andrea Phillips the risks and benefits of TNK. She called two other members of her family who had questions and concerns about TNK. We had a discussion on speaker phone. They were reluctant about TNK and were leaning to more conservative management. Upon  re-evaluation, Andrea Phillips's NIHSS improved to a 3. In particular, initially she could not more her RLE, but now was able to lift it and hold it against gravity with a drift. Her speech also improved. Therefore a decision was made to hold off TNK, obtain CTA/CTP and if unremarkable, to continue with a stroke workup. Andrea Phillips's family was in agreement with that plan. ? ? ?Past Medical History: ?     Hypertension ?     Hyperlipidemia ? ?Medications: ? ?No Anticoagulant use  ?Antiplatelet use: Yes Plavix ?Reviewed EMR for current medications ? ?Allergies:  ? ?Description: Norvasc, ASA, Codeine ? ?Social History: ?Drug Use: No ? ?Family History: ? ?There is no family history of premature cerebrovascular disease pertinent to this consultation ? ?ROS : ?14 Points Review of Systems was performed and was negative except mentioned in HPI. ? ?Past Surgical History: ?There Is No Surgical History Contributory To Today?s Visit ? ? ? ?Examination: ?BP(125/63), Pulse(70), ?1A: Level of Consciousness - Alert; keenly responsive + 0 ?1B: Ask Month and Age - 1 Question Right + 1 ?1C: Blink Eyes & Squeeze Hands - Performs Both Tasks + 0 ?2: Test Horizontal Extraocular Movements - Normal + 0 ?3: Test Visual Fields - No Visual Loss + 0 ?4: Test Facial Palsy (Use Grimace if Obtunded) - Normal symmetry + 0 ?5A: Test Left Arm Motor Drift - No Drift for 10 Seconds + 0 ?5B: Test Right Arm Motor Drift - Drift, but doesn't hit bed + 1 ?6A: Test Left Leg Motor Drift - No Drift for 5 Seconds + 0 ?6B: Test Right Leg Motor Drift - Drift, but doesn't hit bed + 1 ?7: Test Limb Ataxia (FNF/Heel-Shin) - No Ataxia + 0 ?8: Test Sensation - Normal; No sensory loss + 0 ?9: Test Language/Aphasia - Normal; No aphasia + 0 ?10: Test Dysarthria - Normal + 0 ?11: Test Extinction/Inattention - No abnormality + 0 ? ?NIHSS Score: 3 ? ? ?Pre-Morbid Modified Rankin Scale: ?0 Points = No symptoms at all ? ? ?Patient/Family was informed the Neurology Consult  would occur via TeleHealth consult by way of interactive audio and video telecommunications and consented to receiving care in this manner. ? ? ?Patient is being evaluated for possible acute neurologic impairment and high probability of imminent or life-threatening deterioration. I spent total of 55 minutes providing care to this patient, including time for face to face visit via telemedicine, review of medical records, imaging studies and discussion of findings with providers, the patient and/or family. ? ? ?Dr Asa Saunas ? ? ?[facility] ?[phone] ? ? ?Case [id number] ? ?

## 2022-02-09 ENCOUNTER — Inpatient Hospital Stay
Admit: 2022-02-09 | Discharge: 2022-02-09 | Disposition: A | Discharge status: Home / Self Care | Payer: Medicare Other | Attending: Internal Medicine | Admitting: Internal Medicine

## 2022-02-09 ENCOUNTER — Inpatient Hospital Stay: Payer: Medicare Other

## 2022-02-09 ENCOUNTER — Other Ambulatory Visit: Payer: Self-pay

## 2022-02-09 DIAGNOSIS — I639 Cerebral infarction, unspecified: Secondary | ICD-10-CM | POA: Diagnosis not present

## 2022-02-09 LAB — URINALYSIS, ROUTINE W REFLEX MICROSCOPIC
Bilirubin Urine: NEGATIVE
Glucose, UA: NEGATIVE mg/dL
Hgb urine dipstick: NEGATIVE
Ketones, ur: NEGATIVE mg/dL
Leukocytes,Ua: NEGATIVE
Nitrite: NEGATIVE
Protein, ur: NEGATIVE mg/dL
Specific Gravity, Urine: 1.029 (ref 1.005–1.030)
pH: 6 (ref 5.0–8.0)

## 2022-02-09 LAB — LIPID PANEL
Cholesterol: 171 mg/dL (ref 0–200)
HDL: 53 mg/dL (ref >40)
LDL Cholesterol: 94 mg/dL (ref 0–99)
Total CHOL/HDL Ratio: 3.2 RATIO
Triglycerides: 121 mg/dL (ref <150)
VLDL: 24 mg/dL (ref 0–40)

## 2022-02-09 LAB — ECHOCARDIOGRAM COMPLETE
AR max vel: 1.19 cm2
AV Area VTI: 1.14 cm2
AV Area mean vel: 1.19 cm2
AV Mean grad: 4.7 mmHg
AV Peak grad: 8.8 mmHg
Ao pk vel: 1.48 m/s
Area-P 1/2: 3.83 cm2
Height: 63 in
MV VTI: 1.51 cm2
S' Lateral: 3.3 cm
Weight: 2758.4 oz

## 2022-02-09 LAB — URINE DRUG SCREEN, QUALITATIVE (ARMC ONLY)
Amphetamines, Ur Screen: NOT DETECTED
Barbiturates, Ur Screen: NOT DETECTED
Benzodiazepine, Ur Scrn: NOT DETECTED
Cannabinoid 50 Ng, Ur ~~LOC~~: NOT DETECTED
Cocaine Metabolite,Ur ~~LOC~~: NOT DETECTED
MDMA (Ecstasy)Ur Screen: NOT DETECTED
Methadone Scn, Ur: NOT DETECTED
Opiate, Ur Screen: NOT DETECTED
Phencyclidine (PCP) Ur S: NOT DETECTED
Tricyclic, Ur Screen: NOT DETECTED

## 2022-02-09 LAB — TROPONIN I (HIGH SENSITIVITY): Troponin I (High Sensitivity): 34 ng/L — ABNORMAL HIGH (ref <18)

## 2022-02-09 LAB — HEMOGLOBIN A1C
Hgb A1c MFr Bld: 5.4 % (ref 4.8–5.6)
Mean Plasma Glucose: 108.28 mg/dL

## 2022-02-09 LAB — PROCALCITONIN: Procalcitonin: <0.1 ng/mL

## 2022-02-09 MED ORDER — TRAMADOL HCL 50 MG PO TABS
50.0000 mg | ORAL_TABLET | Freq: Two times a day (BID) | ORAL | Status: DC | PRN
  Administered 2022-02-09 – 2022-02-10 (×2): 50 mg via ORAL
  Filled 2022-02-09 (×2): qty 1

## 2022-02-09 MED ORDER — ENOXAPARIN SODIUM 40 MG/0.4ML IJ SOSY
40.0000 mg | PREFILLED_SYRINGE | INTRAMUSCULAR | Status: DC
  Administered 2022-02-09 – 2022-02-10 (×2): 40 mg via SUBCUTANEOUS
  Filled 2022-02-09 (×2): qty 0.4

## 2022-02-09 MED ORDER — FUROSEMIDE 20 MG PO TABS
20.0000 mg | ORAL_TABLET | Freq: Every day | ORAL | Status: DC
  Administered 2022-02-09 – 2022-02-11 (×3): 20 mg via ORAL
  Filled 2022-02-09 (×3): qty 1

## 2022-02-09 MED ORDER — LEVOTHYROXINE SODIUM 112 MCG PO TABS
112.0000 ug | ORAL_TABLET | Freq: Every day | ORAL | Status: DC
  Administered 2022-02-09 – 2022-02-11 (×3): 112 ug via ORAL
  Filled 2022-02-09 (×3): qty 1

## 2022-02-09 MED ORDER — STROKE: EARLY STAGES OF RECOVERY BOOK: Freq: Once | Status: DC

## 2022-02-09 MED ORDER — ACETAMINOPHEN 160 MG/5ML PO SOLN
650.0000 mg | ORAL | Status: DC | PRN
  Filled 2022-02-09: qty 20.3

## 2022-02-09 MED ORDER — ACETAMINOPHEN 325 MG PO TABS: 650.0000 mg | ORAL_TABLET | Freq: Four times a day (QID) | ORAL | Status: DC | PRN

## 2022-02-09 MED ORDER — ACETAMINOPHEN 650 MG RE SUPP: 650.0000 mg | RECTAL | Status: DC | PRN

## 2022-02-09 MED ORDER — ACETAMINOPHEN 325 MG PO TABS
ORAL_TABLET | ORAL | Status: AC
  Administered 2022-02-09: 650 mg via ORAL
  Filled 2022-02-09: qty 2

## 2022-02-09 MED ORDER — ACETAMINOPHEN 325 MG PO TABS
650.0000 mg | ORAL_TABLET | ORAL | Status: DC | PRN
  Administered 2022-02-09 (×2): 650 mg via ORAL
  Filled 2022-02-09 (×2): qty 2

## 2022-02-09 MED ORDER — SODIUM CHLORIDE 0.9 % IV SOLN: INTRAVENOUS | Status: AC

## 2022-02-09 MED ORDER — CLOPIDOGREL BISULFATE 75 MG PO TABS
75.0000 mg | ORAL_TABLET | Freq: Every day | ORAL | Status: DC
  Administered 2022-02-09 – 2022-02-10 (×2): 75 mg via ORAL
  Filled 2022-02-09 (×2): qty 1

## 2022-02-09 NOTE — Evaluation (Signed)
Physical Therapy Evaluation ?Patient Details ?Name: Andrea Phillips ?MRN: [id number] ?DOB: [birth year] ?Today's Date: 02/09/2022 ? ?History of Present Illness ? Patient is an 87 year old female with a PMH (+) for  hypertension, hyperlipidemia, CAD, CHF, and hypothyroidism who presented to the ED with stroke like symptoms. ?  ?Clinical Impression ? Physical Therapy Evaluation completed this date. Patient tolerated session fairly well, however was limited due to fatigue/dizziness. No pain reported throughout session. Reviewed home set up with patient from prior admission. Patient reported she lives with family in a 2 story home with 1-2 STE. Family is able to give PRN assistance, as per patient they are not home throughout the day. Patient furniture walks within the home, however utilizes her walking stick when out in the community. Patient was Mod I with all mobility/ADLs prior to hospitalization.  ? ?Patient demonstrated decreased BUE and BLE strength (at least 3/5). LUE and LLE demonstrated increased strength compared to right, and decreased BLE ROM noted due to fatigue/weakness. Patient required Mod A at the trunk and BLEs to complete supine to sit bed mobility, however was able to complete sit to supine at supervision. Upon sitting, patient reported increased dizziness. BP reported at 134/35mHg. Sit to stand from EOB was completed at CGA/SBA for safety due to patient's first time out of bed. Increased time and effort noted to come to full standing. With encouragement, patient ambulated ~8 steps from EOB<>bathroom door at SUP/SBA. Patient was left in bed with all needs met and in reach. Patient would continue to benefit from skilled physical therapy in order to optimize patient's return to PLOF. Recommend STR upon discharge from acute hospitalization.    ? ?Recommendations for follow up therapy are one component of a multi-disciplinary discharge planning process, led by the attending physician.  Recommendations  may be updated based on patient status, additional functional criteria and insurance authorization. ? ?Follow Up Recommendations Skilled nursing-short term rehab (<3 hours/day) ? ?  ?Assistance Recommended at Discharge Intermittent Supervision/Assistance  ?Patient can return home with the following ? A little help with walking and/or transfers;Help with stairs or ramp for entrance ? ?  ?Equipment Recommendations None recommended by PT (patient has all necessary equitment at home)  ?Recommendations for Other Services ?    ?  ?Functional Status Assessment Patient has had a recent decline in their functional status and demonstrates the ability to make significant improvements in function in a reasonable and predictable amount of time.  ? ?  ?Precautions / Restrictions Precautions ?Precautions: Fall ?Restrictions ?Weight Bearing Restrictions: No  ? ?  ? ?Mobility ? Bed Mobility ?Overal bed mobility: Needs Assistance ?Bed Mobility: Supine to Sit, Sit to Supine ?  ?  ?Supine to sit: Mod assist (assistance at the trunk and BLEs) ?Sit to supine: Supervision ?  ?  ?Patient Response: Flat affect, Cooperative ? ?Transfers ?Overall transfer level: Needs assistance ?Equipment used: Rolling walker (2 wheels) ?Transfers: Sit to/from Stand ?Sit to Stand: Min guard (SBA) ?  ?  ?  ?  ?  ?General transfer comment: increased time and effort to come to full standing, CGA/SBA for safety as it was patient's first time out of bed ?  ? ?Ambulation/Gait ?Ambulation/Gait assistance: Supervision ?Gait Distance (Feet): 8 Feet ?Assistive device: Rolling walker (2 wheels) ?Gait Pattern/deviations: Step-through pattern, Decreased step length - right, Decreased step length - left, Decreased stride length, Narrow base of support, Trunk flexed ?Gait velocity: decreased ?  ?  ?General Gait Details: mild unsteadiness during ambulation, however  no LOB noted, ambulated from EOB<>bathroom door ? ?Stairs ?  ?  ?  ?  ?  ? ?Wheelchair Mobility ?  ? ?Modified  Rankin (Stroke Patients Only) ?  ? ?  ? ?Balance Overall balance assessment: Needs assistance ?Sitting-balance support: Bilateral upper extremity supported, Feet supported ?Sitting balance-Leahy Scale: Fair ?Sitting balance - Comments: Increased dizziness upon coming to sitting, patient able to sit for no UE support for short period of time however due to fatigue required use of BUEs ?  ?Standing balance support: Bilateral upper extremity supported, During functional activity, Reliant on assistive device for balance ?Standing balance-Leahy Scale: Fair ?Standing balance comment: heavy reliance on AD for stability in standing ?  ?  ?  ?  ?  ?  ?  ?  ?  ?  ?  ?   ? ? ? ?Pertinent Vitals/Pain Pain Assessment ?Pain Assessment: No/denies pain  ? ? ?Home Living Family/patient expects to be discharged to:: Private residence ?Living Arrangements: Other relatives ?Available Help at Discharge: Family;Available PRN/intermittently ?Type of Home: House ?Home Access: Stairs to enter ?Entrance Stairs-Rails: Right ?Entrance Stairs-Number of Steps: 2-3 ?  ?Home Layout: Two level;Able to live on main level with bedroom/bathroom ?Home Equipment: Conservation officer, nature (2 wheels);Cane - single point;Other (comment) (walking stick) ?Additional Comments: no falls in the last 2 months  ?  ?Prior Function Prior Level of Function : Independent/Modified Independent ?  ?  ?  ?  ?  ?  ?Mobility Comments: walking stick for community ambulation, pt endorses furniture walking in home ?  ?  ? ? ?Hand Dominance  ? Dominant Hand: Right ? ?  ?Extremity/Trunk Assessment  ? Upper Extremity Assessment ?Upper Extremity Assessment: Generalized weakness (At least 3/5 strength bilaterally LUE>RUE) ?  ? ?Lower Extremity Assessment ?Lower Extremity Assessment: Generalized weakness (At least 3/5 strength bilaterally LLE>RLE) ?  ? ?   ?Communication  ? Communication: No difficulties  ?Cognition Arousal/Alertness: Awake/alert ?Behavior During Therapy: [facility] for tasks  assessed/performed ?Overall Cognitive Status: Within Functional Limits for tasks assessed ?  ?  ?  ?  ?  ?  ?  ?  ?  ?  ?  ?  ?  ?  ?  ?  ?General Comments: Alert and oriented to self and location, disoriented to situation ?  ?  ? ?  ?General Comments General comments (skin integrity, edema, etc.): BP: 135/56 upon coming to sitting, HR: 63-88bpm, SpO2 remained >90% on room air ? ?  ?Exercises Other Exercises ?Other Exercises: patient educated on role of PT in acute care setting, fall risk, and d/c recommendations ?Other Exercises: x10 LAQ at EOB bilaterally  ? ?Assessment/Plan  ?  ?PT Assessment Patient needs continued PT services  ?PT Problem List Decreased strength;Decreased balance;Decreased range of motion;Decreased mobility;Decreased activity tolerance;Pain;Decreased cognition ? ?   ?  ?PT Treatment Interventions DME instruction;Functional mobility training;Balance training;Gait training;Therapeutic activities;Stair training;Therapeutic exercise   ? ?PT Goals (Current goals can be found in the Care Plan section)  ?Acute Rehab PT Goals ?PT Goal Formulation: Patient unable to participate in goal setting ?Time For Goal Achievement: 02/23/22 ?Potential to Achieve Goals: Fair ? ?  ?Frequency 7X/week ?  ? ? ?Co-evaluation   ?  ?  ?  ?  ? ? ?  ?AM-PAC PT "6 Clicks" Mobility  ?Outcome Measure Help needed turning from your back to your side while in a flat bed without using bedrails?: A Little ?Help needed moving from lying on your back to sitting on  the side of a flat bed without using bedrails?: A Lot ?Help needed moving to and from a bed to a chair (including a wheelchair)?: A Little ?Help needed standing up from a chair using your arms (e.g., wheelchair or bedside chair)?: A Little ?Help needed to walk in hospital room?: A Little ?Help needed climbing 3-5 steps with a railing? : A Lot ?6 Click Score: 16 ? ?  ?End of Session Equipment Utilized During Treatment: Gait belt ?Activity Tolerance: Patient limited by  fatigue ?Patient left: in bed;with call bell/phone within reach;with bed alarm set ?Nurse Communication: Mobility status ?PT Visit Diagnosis: Unsteadiness on feet (R26.81);Muscle weakness (generalized) (M62.81);

## 2022-02-09 NOTE — Assessment & Plan Note (Signed)
Continue CPAP.  

## 2022-02-09 NOTE — [place] FL2 (Signed)
?[place] MEDICAID FL2 LEVEL OF CARE SCREENING TOOL  ?  ? ?IDENTIFICATION  ?Patient Name: ?Andrea SmallDelores C Kille Birthdate: [birth year] Sex: female Admission Date (Current Location): ?02/08/2022  ?[place] ?  Facility and Address:  ?[facility], [address], [place], [place] [address] ?     Provider Number: ?[id number]  ?Attending Physician Name and Address:  ?Arnetha CourserAmin, Sumayya, MD ? Relative Name and Phone Number:  ?  ?   ?Current Level of Care: ?Hospital Recommended Level of Care: ?Skilled Nursing Facility Prior Approval Number: ?  ? ?Date Approved/Denied: ?  PASRR Number: ?[id number] A ? ?Discharge Plan: ?SNF ?  ? ?Current Diagnoses: ?Patient Active Problem List  ? Diagnosis Date Noted  ? Acute CVA (cerebrovascular accident) (HCC) 02/08/2022  ? Sleep apnea with use of continuous positive airway pressure (CPAP) 02/08/2022  ? Hypotension   ? Elevated lactic acid level   ? AKI (acute kidney injury) (HCC) 11/15/2021  ? Diarrhea 11/15/2021  ? Coronary artery disease involving native coronary artery of native heart without angina pectoris 11/05/2021  ? Chest pain   ? H/O medication noncompliance 10/24/2021  ? CAP (community acquired pneumonia) 08/31/2021  ? Multifocal pneumonia 08/29/2021  ? Chronic HFrEF (heart failure with reduced ejection fraction)  with improved EF(HCC) 04/02/2021  ? Hypothyroidism 04/02/2021  ? Syncope and collapse 04/07/2020  ? Right hip pain 04/07/2020  ? Frequent PVCs 04/07/2020  ? OSA (obstructive sleep apnea) 04/07/2020  ? Elevated troponin 04/07/2020  ? Varicose veins of both lower extremities with pain 04/04/2017  ? Essential hypertension 04/04/2017  ? Hyperlipidemia, mixed 11/22/2006  ? ? ?Orientation RESPIRATION BLADDER Height & Weight   ?  ?Self, Time, Situation, Place ? Normal Continent Weight: 172 lb 6.4 oz (78.2 kg) ?Height:  5\' 3"  (160 cm)  ?BEHAVIORAL SYMPTOMS/MOOD NEUROLOGICAL BOWEL NUTRITION STATUS  ?    Continent Diet (heart  healthy, thin liquids)  ?AMBULATORY STATUS COMMUNICATION OF NEEDS Skin   ?Limited Assist Verbally Normal ?  ?  ?  ?    ?     ?     ? ? ?Personal Care Assistance Level of Assistance  ?Dressing, Bathing, Feeding Bathing Assistance: Limited assistance ?Feeding assistance: Independent ?Dressing Assistance: Limited assistance ?   ? ?Functional Limitations Info  ?Sight, Speech, Hearing Sight Info: Adequate ?Hearing Info: Adequate ?Speech Info: Adequate  ? ? ?SPECIAL CARE FACTORS FREQUENCY  ?PT (By licensed PT), OT (By licensed OT)   ?  ?PT Frequency: 5x ?OT Frequency: 5x ?  ?  ?  ?   ? ? ?Contractures Contractures Info: Not present  ? ? ?Additional Factors Info  ?Code Status, Allergies Code Status Info: full code ?Allergies Info: Aspirin, Other, Atorvastatin, Codeine, Conj Estrog-medroxyprogest Ace, Prednisone, Raloxifene, Ace Inhibitors, Amlodipine, Valsartan ?  ?  ?  ?   ? ?Current Medications (02/09/2022):  This is the current hospital active medication list ?Current Facility-Administered Medications  ?Medication Dose Route Frequency Provider Last Rate Last Admin  ?  stroke: early stages of recovery book   Does not apply Once Andris Baumannuncan, Hazel V, MD      ? acetaminophen (TYLENOL) tablet 650 mg  650 mg Oral Q4H PRN Andris Baumannuncan, Hazel V, MD   650 mg at 02/09/22 0033  ? Or  ? acetaminophen (TYLENOL) 160 MG/5ML solution 650 mg  650 mg Per Tube Q4H PRN Andris Baumannuncan, Hazel V, MD      ? Or  ? acetaminophen (TYLENOL) suppository 650 mg  650 mg  Rectal Q4H PRN Andris Baumann, MD      ? clopidogrel (PLAVIX) tablet 75 mg  75 mg Oral Daily Andris Baumann, MD   75 mg at 02/09/22 0900  ? enoxaparin (LOVENOX) injection 40 mg  40 mg Subcutaneous Q24H Andris Baumann, MD   40 mg at 02/09/22 0853  ? furosemide (LASIX) tablet 20 mg  20 mg Oral Daily Lindajo Royal V, MD   20 mg at 02/09/22 0900  ? levothyroxine (SYNTHROID) tablet 112 mcg  112 mcg Oral Q0600 Andris Baumann, MD   112 mcg at 02/09/22 4503  ? ? ? ?Discharge Medications: ?Please see  discharge summary for a list of discharge medications. ? ?Relevant Imaging Results: ? ?Relevant Lab Results: ? ? ?Additional Information ?ssn: [phone] ? ?Shawnise Peterkin A Cristina Mattern, LCSW ? ? ? ? ?

## 2022-02-09 NOTE — Assessment & Plan Note (Addendum)
Continue levothyroxine 

## 2022-02-09 NOTE — Progress Notes (Signed)
OT Cancellation Note ? ?Patient Details ?Name: Andrea Phillips ?MRN: [id number] ?DOB: 1935/07/25 ? ? ?Cancelled Treatment:    Reason Eval/Treat Not Completed: Pain limiting ability to participate;Other (comment). OT order received. Chart reviewed. Upon arrival to pt room, pt with RN in room. Per RN pt has significant pain 2/2 headache. Pt nods in agreement. Pt/RN requesting therapist return at a later time. Will re-attempt OT evaluation at a later date/time as available and pt medically appropriate.  ? ?Rockney Ghee, M.S., OTR/L ?Feeding [facility] - [facility] ?Ascom: [phone] ?02/09/22, 11:16 AM ? ? ?

## 2022-02-09 NOTE — TOC Initial Note (Signed)
Transition of Care (TOC) - Initial/Assessment Note  ? ? ?Patient Details  ?Name: Andrea Phillips ?MRN: [id number] ?Date of Birth: 1935/01/31 ? ?Transition of Care (TOC) CM/SW Contact:    ?Andrea Kafer A Delando Satter, LCSW ?Phone Number: ?02/09/2022, 10:33 AM ? ?Clinical Narrative:  CSW spoke with pt regarding PT rec for SNF. Pt is agreeable with no SNF preference. Pt would like it to be in [place]. Pt is agreeable to [facility] f/o and CSW to provide b/o once available.                ? ? ?Expected Discharge Plan: [place] ?Barriers to Discharge: Continued Medical Work up ? ? ?Patient Goals and CMS Choice ?Patient states their goals for this hospitalization and ongoing recovery are:: to get stronger ?  ?Choice offered to / list presented to : Patient ? ?Expected Discharge Plan and Services ?Expected Discharge Plan: [place] ?In-house Referral: NA ?  ?Post Acute Care Choice: [place] ?Living arrangements for the past 2 months: [place] ?                ?  ?  ?  ?  ?  ?  ?  ?  ?  ?  ? ?Prior Living Arrangements/Services ?Living arrangements for the past 2 months: [place] ?Lives with:: Relatives ?Patient language and need for interpreter reviewed:: Yes ?Do you feel safe going back to the place where you live?: Yes      ?Need for Family Participation in Patient Care: Yes (Comment) ?Care giver support system in place?: Yes (comment) ?  ?Criminal Activity/Legal Involvement Pertinent to Current Situation/Hospitalization: No - Comment as needed ? ?Activities of Daily Living ?Home Assistive Devices/Equipment: Cane (specify quad or straight) ?ADL Screening (condition at time of admission) ?Patient's cognitive ability adequate to safely complete daily activities?: Yes ?Is the patient deaf or have difficulty hearing?: No ?Does the patient have difficulty seeing, even when wearing glasses/contacts?: No ?Does the patient have difficulty concentrating, remembering, or  making decisions?: No ?Patient able to express need for assistance with ADLs?: Yes ?Does the patient have difficulty dressing or bathing?: No ?Independently performs ADLs?: Yes (appropriate for developmental age) ?Does the patient have difficulty walking or climbing stairs?: Yes ?Weakness of Legs: Both ?Weakness of Arms/Hands: None ? ?Permission Sought/Granted ?Permission sought to share information with : Family Supports ?Permission granted to share information with : Yes, Release of Information Signed ? Share Information with NAME: Andrea Phillips ?   ? Permission granted to share info w Relationship: sister ?   ? ?Emotional Assessment ?Appearance:: Appears stated age ?Attitude/Demeanor/Rapport: Engaged ?Affect (typically observed): Accepting ?Orientation: : Oriented to Self, Oriented to Place, Oriented to  Time, Oriented to Situation ?Alcohol / Substance Use: Not Applicable ?Psych Involvement: No (comment) ? ?Admission diagnosis:  Acute ischemic stroke [facility]) [I63.9] ?Acute CVA (cerebrovascular accident) ([name]) [I63.9] ?Patient Active Problem List  ? Diagnosis Date Noted  ? Acute CVA (cerebrovascular accident) ([place]) 02/08/2022  ? Sleep apnea with use of continuous positive airway pressure (CPAP) 02/08/2022  ? Hypotension   ? Elevated lactic acid level   ? AKI (acute kidney injury) ([place]) 11/15/2021  ? Diarrhea 11/15/2021  ? Coronary artery disease involving native coronary artery of native heart without angina pectoris 11/05/2021  ? Chest pain   ? H/O medication noncompliance 10/24/2021  ? CAP (community acquired pneumonia) 08/31/2021  ? Multifocal pneumonia 08/29/2021  ? Chronic HFrEF (heart failure with reduced ejection fraction)  with improved  EF(HCC) 04/02/2021  ? Hypothyroidism 04/02/2021  ? Syncope and collapse 04/07/2020  ? Right hip pain 04/07/2020  ? Frequent PVCs 04/07/2020  ? OSA (obstructive sleep apnea) 04/07/2020  ? Elevated troponin 04/07/2020  ? Varicose veins of both lower extremities with pain 04/04/2017   ? Essential hypertension 04/04/2017  ? Hyperlipidemia, mixed 11/22/2006  ? ?PCP:  Perrin Maltese, MD ?Pharmacy:   ?[facility], [address] ?[address] ?[place] [address] ?Phone: [phone] Fax: [phone] ? ?[facility] [place], [place] AT [facility] [address] ?[place] ?[place] [address] ?Phone: [phone] Fax: [phone] ? ? ? ? ?Social Determinants of Health (SDOH) Interventions ?  ? ?Readmission Risk Interventions ?   ? View : No data to display.  ?  ?  ?  ? ? ? ?

## 2022-02-09 NOTE — Progress Notes (Signed)
*  PRELIMINARY RESULTS* ?Echocardiogram ?2D Echocardiogram has been performed. ? ?[name], Dorene Sorrow ?02/09/2022, 12:15 PM ?

## 2022-02-09 NOTE — Progress Notes (Addendum)
SLP Cancellation Note ? ?Patient Details ?Name: Andrea Phillips ?MRN: [id number] ?DOB: November 16, 1934 ? ? ?Cancelled treatment:       Reason Eval/Treat Not Completed: Patient at procedure or test/unavailable (echocardiogram; chart reviewed) ?Attempted to see pt, however, she was having a test at bedside. Will attempt to f/u this PM or in the morning for cognitive-linguistic evaluation.  ? ?Addendum: pt is off the floor having an MRI upon attempt to stop at the room. Noted repeat Head CT results: "persistent headache which was resistant to pain medications and it shows evolving left PCA territory infarct.".  ?Will hold on evaluation today d/t results of repeated Head CT and f/u w/ pt's status tomorrow.  ? ? ? ?Jerilynn Som, MS, CCC-SLP ?Speech Language Pathologist ?Rehab Services; [facility] [place] ?[phone] (ascom) ?Jackson Fetters ?02/09/2022, 5:03 PM ?

## 2022-02-09 NOTE — ED Notes (Signed)
Dr. Duncan at the bedside  

## 2022-02-09 NOTE — Assessment & Plan Note (Addendum)
BP controlled . ? Hold metoprolol and telmisartan for permissive hypertensive ?

## 2022-02-09 NOTE — Progress Notes (Signed)
Patient complaint of head ache. Acetaminophen given with no improvement. MD Amin notifed, Ultram ordered, patient stated no improvement. MD notified , CT of head ordered. ? ?

## 2022-02-09 NOTE — TOC Progression Note (Signed)
Transition of Care (TOC) - Progression Note  ? ? ?Patient Details  ?Name: Andrea Phillips ?MRN: [id number] ?Date of Birth: Jul 08, 1935 ? ?Transition of Care (TOC) CM/SW Contact  ?Candie Chroman, LCSW ?Phone Number: ?02/09/2022, 3:34 PM ? ?Clinical Narrative: Met with patient. Family at bedside. Gave bed offers. Patient asked if she could go home instead. Patient does not have anyone to stay with her during the day. Asked PT/OT if they could come see her tomorrow and see if home is a safe option.   ? ?Expected Discharge Plan: [place] ?Barriers to Discharge: Continued Medical Work up ? ?Expected Discharge Plan and Services ?Expected Discharge Plan: [place] ?In-house Referral: NA ?  ?Post Acute Care Choice: [place] ?Living arrangements for the past 2 months: [place] ?                ?  ?  ?  ?  ?  ?  ?  ?  ?  ?  ? ? ?Social Determinants of Health (SDOH) Interventions ?  ? ?Readmission Risk Interventions ?   ? View : No data to display.  ?  ?  ?  ? ? ?

## 2022-02-09 NOTE — Assessment & Plan Note (Addendum)
No complaints of chest pain and EKG nonacute ?S/p cath January 2023 for severe single-vessel CAD ?Hold Imdur, Toprol for permissive hypertensive. continue pravastatin ? ?

## 2022-02-09 NOTE — Hospital Course (Addendum)
Taken from prior notes. ? ?Andrea Phillips is a 87 y.o. female with medical history significant for HFrEF with improved EF, HLD, HTN OSA, hypothyroidism, CAD, brought in as code stroke after patient was noted to stumble and have slurred speech and right-sided weakness after having dinner.  Patient arrives within the tPA window NIHSS was 7 on arrival.  Initial head CT was negative.  The ED provider as well as neurologist on call discussed TNK with family members however the family opted against it and the decision was made to obtain CTA and if unremarkable to continue with stroke work-up.  CTA and CT cerebral perfusion showed no LVO or high-grade stenosis but did show a left PCA territory core infarct with area of ischemic penumbra.  By the time of the results, patient's NIHSS had improved to 3 but by which time patient no longer met criteria for TNK due to being outside the window and with improving symptoms.   ?She was treated with aspirin and hospitalist consulted for admission. ?Additional ED data reviewed: Lab work significant for creatinine of 1.15, up from 0.82 with troponin of 22 with other blood work unremarkable, COVID and flu negative.  Chest x-ray clear. ? ?MRI ordered-still pending. ?Repeated CT head due to persistent headache which was resistant to pain medications and it shows evolving left PCA territory infarct.  No evidence of hemorrhagic conversion and no midline shift. ?A1c of 5.4 ?Lipid panel within normal limit with LDL of 94 ?Echocardiogram done-with reduced EF of 40 to 45% and regional wall motion abnormalities along with grade 2 diastolic dysfunction. ?PT/OT now change recommendations to home health services. ?Patient was also found to have new onset atrial fibrillation/flutter-cardiology was consulted for this new arrhythmia and new regional wall motion abnormalities.  Troponin barely positive with a downward trend, most likely secondary to demand ischemia with new stroke and new onset  A-fib. ?CTA with no large vessel occlusion ? ?Cardiology started her on amiodarone and Eliquis.  Patient is allergic to aspirin and Plavix was discontinued after starting Eliquis.  Most likely has embolic stroke with new onset A-fib. ?CHA2DS2-VASc score of 8.  Currently rate controlled. ?No plan for inpatient ischemic work-up, patient will follow-up with her own cardiologist for further recommendations on discharge. ? ?Her home dose of metoprolol was later discontinued due to bradycardia and amiodarone dose was decreased to 200 mg daily.  She was converted back to sinus rhythm before discharge. ? ?Patient will continue current regimen and need to have a close follow-up with her cardiologist for further recommendations. ? ?Patient also need to have a follow-up with neurology in 2 to 4 weeks. ? ?

## 2022-02-09 NOTE — Progress Notes (Signed)
?Progress Note ? ? ?Patient: Andrea Phillips [id number]:[id number] DOB: 04-Nov-1934 DOA: 02/08/2022     1 ?DOS: the patient was seen and examined on 02/09/2022 ?  ?Brief hospital course: ?Taken from prior notes. ? ?Andrea Phillips is a 87 y.o. female with medical history significant for HFrEF with improved EF, HLD, HTN OSA, hypothyroidism, CAD, brought in as code stroke after patient was noted to stumble and have slurred speech and right-sided weakness after having dinner.  Patient arrives within the tPA window NIHSS was 7 on arrival.  Initial head CT was negative.  The ED provider as well as neurologist on call discussed TNK with family members however the family opted against it and the decision was made to obtain CTA and if unremarkable to continue with stroke work-up.  CTA and CT cerebral perfusion showed no LVO or high-grade stenosis but did show a left PCA territory core infarct with area of ischemic penumbra.  By the time of the results, patient's NIHSS had improved to 3 but by which time patient no longer met criteria for TNK due to being outside the window and with improving symptoms.   ?She was treated with aspirin and hospitalist consulted for admission. ?Additional ED data reviewed: Lab work significant for creatinine of 1.15, up from 0.82 with troponin of 22 with other blood work unremarkable, COVID and flu negative.  Chest x-ray clear. ? ?MRI ordered-still pending. ?Repeated CT head due to persistent headache which was resistant to pain medications and it shows evolving left PCA territory infarct.  No evidence of hemorrhagic conversion and no midline shift. ?A1c of 5.4 ?Lipid panel within normal limit with LDL of 94 ?Echocardiogram done-pending results ?PT/OT are recommending rehab-TOC was consulted. ? ? ? ?Assessment and Plan: ?* Acute CVA (cerebrovascular accident) ([place]) ?Recommendations per neurology: ?? ?    ?  Stroke/Telemetry Floor ?    ?  Neuro Checks ?    ?  Bedside Swallow Eval ?    ?  DVT  Prophylaxis ?    ?  IV Fluids, Normal Saline ?    ?  Head of Bed 30 Degrees ?    ?  Euglycemia and Avoid Hyperthermia (PRN Acetaminophen) ?    ?  Initiate Clopidogrel 75 mg daily ?    ?  Antihypertensives PRN if Blood pressure is greater than 220/120 or there is a concern for End organ damage/contraindications for permissive HTN. If blood pressure is greater than 220/120 give labetalol PO or IV or Vasotec IV with a goal of 15% reduction in BP during the first 24 hours. ?    ?  - MRI Brain without contrast-still pending ?    ?  - TTE (Had echo January 2023 showing improved EF to 55% from 35%)-done with pending results ?    ?  - Labs: lipid panel; HgbA1c-within normal limit ?    ?  - PT/OT/ST recommending rehab ? ? ? ?AKI (acute kidney injury) ([place]) ?  Creatinine was 1.15 up from recent baseline of 0.9 . ?-Monitor renal function ?-Avoid nephrotoxins ? ?Coronary artery disease involving native coronary artery of native heart without angina pectoris ?No complaints of chest pain and EKG nonacute ?S/p cath January 2023 for severe single-vessel CAD ?Hold Imdur, Toprol for permissive hypertensive. continue pravastatin ? ? ?Chronic HFrEF (heart failure with reduced ejection fraction)  with improved EF(HCC) ?EF of 50 to 55% in January 2023 up from prior of 30 to 35% ?Currently euvolemic ?Hold telmisartan metoprolol and furosemide for  permissive hypertension ? ?Hypothyroidism ?-Continue levothyroxine ? ?OSA (obstructive sleep apnea) ?Continue CPAP ? ?Essential hypertension ?BP controlled . ? Hold metoprolol and telmisartan for permissive hypertensive ? ? ?Subjective: Patient was complaining of headache, Tylenol did not work.  Denies any nausea or vomiting.  No change in vision.  No new deficit. ? ?Physical Exam: ?Vitals:  ? 02/09/22 0453 02/09/22 0857 02/09/22 1140 02/09/22 1602  ?BP: (!) 122/54 (!) 164/66 (!) 154/61 (!) 144/62  ?Pulse: (!) 56 (!) 58 63 (!) 59  ?Resp: '16 16 18 18  ' ?Temp: 97.6 ?F (36.4 ?C) 97.6 ?F (36.4 ?C)  98.3 ?F (36.8 ?C) 98.1 ?F (36.7 ?C)  ?TempSrc: Oral     ?SpO2: 99% 95% 96% 95%  ?Weight:      ?Height:      ? ?General.     In no acute distress. ?Pulmonary.  Lungs clear bilaterally, normal respiratory effort. ?CV.  Regular rate and rhythm, no JVD, rub or murmur. ?Abdomen.  Soft, nontender, nondistended, BS positive. ?CNS.  Alert and oriented .  No focal neurologic deficit. ?Extremities.  No edema, no cyanosis, pulses intact and symmetrical. ?Psychiatry.  Judgment and insight appears normal. ? ?Data Reviewed: ?Prior notes, labs and images reviewed ? ?Family Communication: Discussed with sister at bedside ? ?Disposition: ?Status is: Inpatient ?Remains inpatient appropriate because: Severity of illness ? ? Planned Discharge Destination: Skilled nursing facility ? ?DVT prophylaxis.  Lovenox ?Time spent: 50 minutes ? ?This record has been created using Systems analyst. Errors have been sought and corrected,but may not always be located. Such creation errors do not reflect on the standard of care. ? ?Author: ?Lorella Nimrod, MD ?02/09/2022 4:50 PM ? ?For on call review [email or web address].  ?

## 2022-02-09 NOTE — H&P (Signed)
?History and Physical  ? ? ?Patient: Andrea Phillips [id number]:[id number] DOB: April 01, 1935 ?DOA: 02/08/2022 ?DOS: the patient was seen and examined on 02/09/2022 ?PCP: Perrin Maltese, MD  ?Patient coming from: Home ? ?Chief Complaint:  ?Chief Complaint  ?Patient presents with  ? Code Stroke  ? ? ?HPI: Andrea Phillips is a 87 y.o. female with medical history significant for HFrEF with improved EF, HLD, HTN OSA, hypothyroidism, CAD, brought in as code stroke after patient was noted to stumble and have slurred speech and right-sided weakness after having dinner.  Patient arrives within the tPA window NIHSS was 7 on arrival.  Initial head CT was negative.  The ED provider as well as neurologist on call discussed TNK with family members however the family opted against it and the decision was made to obtain CTA and if unremarkable to continue with stroke work-up.  CTA and CT cerebral perfusion showed no LVO or high-grade stenosis but did show a left PCA territory core infarct with area of ischemic penumbra.  By the time of the results, patient's NIHSS had improved to 3 but by which time patient no longer met criteria for TNK due to being outside the window and with improving symptoms.   ?She was treated with aspirin and hospitalist consulted for admission. ?Additional ED data reviewed: Lab work significant for creatinine of 1.15, up from 0.82 with troponin of 22 with other blood work unremarkable, COVID and flu negative.  Chest x-ray clear. ?Neurology note reviewed and recommendations noted  ? ? ?Past Medical History:  ?Diagnosis Date  ? Arthritis   ? ra  ? Asthma   ? CHF (congestive heart failure) ([place])   ? Dysrhythmia   ? Edema extremities   ? GERD (gastroesophageal reflux disease)   ? Gout   ? Headache   ? History of hiatal hernia   ? Hypertension   ? Hypothyroidism   ? Shortness of breath dyspnea   ? Sleep apnea   ? ?Past Surgical History:  ?Procedure Laterality Date  ? APPENDECTOMY    ? BACK SURGERY    ? CARDIAC  CATHETERIZATION    ? CATARACT EXTRACTION W/PHACO Left 03/09/2015  ? Procedure: CATARACT EXTRACTION PHACO AND INTRAOCULAR LENS PLACEMENT (IOC);  Surgeon: Estill Cotta, MD;  Location: [facility] ORS;  Service: Ophthalmology;  Laterality: Left;  [place] 01:31 ?AP% 26.1 ?CDE 43.72  ? CATARACT EXTRACTION W/PHACO Right 08/19/2021  ? Procedure: CATARACT EXTRACTION PHACO AND INTRAOCULAR LENS PLACEMENT ([place]) RIGHT;  Surgeon: Birder Robson, MD;  Location: [facility] ORS;  Service: Ophthalmology;  Laterality: Right;  12.31 ?1:07.0  ? CATARACT EXTRACTION W/PHACO Right 09/07/2021  ? Procedure: REMOVAL OF LENS FRAGMENTS RIGHT;  Surgeon: Birder Robson, MD;  Location: [place];  Service: Ophthalmology;  Laterality: Right;  ? CHOLECYSTECTOMY    ? CORONARY/GRAFT ACUTE MI REVASCULARIZATION N/A 10/24/2021  ? Procedure: Coronary/Graft Acute MI Revascularization;  Surgeon: Nelva Bush, MD;  Location: [place] [facility];  Service: Cardiovascular;  Laterality: N/A;  ? EYE SURGERY    ? retina  ? JOINT REPLACEMENT    ? left knee  ? LEFT HEART CATH AND CORONARY ANGIOGRAPHY N/A 04/02/2021  ? Procedure: LEFT HEART CATH AND CORONARY ANGIOGRAPHY;  Surgeon: Corey Skains, MD;  Location: [place] CV LAB;  Service: Cardiovascular;  Laterality: N/A;  ? LEFT HEART CATH AND CORONARY ANGIOGRAPHY N/A 10/24/2021  ? Procedure: LEFT HEART CATH AND CORONARY ANGIOGRAPHY;  Surgeon: Nelva Bush, MD;  Location: [place] CV LAB;  Service: Cardiovascular;  Laterality: N/A;  ? ?Social History:  reports that she has never smoked. She has never used smokeless tobacco. She reports that she does not drink alcohol and does not use drugs. ? ?Allergies  ?Allergen Reactions  ? Aspirin Hives  ? Other Hives  ?  Yellow dye 6 ?FOOD COLOR YELLOW POWDER ?  ? Atorvastatin Other (See Comments)  ?  unknown  ? Codeine Other (See Comments)  ?  Doesn't know  ? Conj Estrog-Medroxyprogest Ace Other (See Comments)  ?  PREMPRO 0.3-1.5 MG ORAL TABLET ?unknown  ?  Prednisone Other (See Comments)  ?  Chest pain  ? Raloxifene Other (See Comments)  ?  EVISTA 60 MG ORAL TABLET ?unknown  ? Ace Inhibitors Cough  ? Amlodipine Itching  ? Valsartan Itching  ? ? ?Family History  ?Problem Relation Age of Onset  ? Heart disease Mother   ? Heart disease Father   ? Breast cancer Cousin   ? ? ?Prior to Admission medications   ?Medication Sig Start Date End Date Taking? Authorizing Provider  ?albuterol (VENTOLIN HFA) 108 (90 Base) MCG/ACT inhaler Inhale 2 puffs into the lungs every 6 (six) hours as needed for wheezing or shortness of breath.    [provider]  ?cetirizine (ZYRTEC) 10 MG tablet Take 10 mg by mouth daily.    [provider]  ?fluticasone (FLONASE) 50 MCG/ACT nasal spray Place 1 spray into both nostrils 2 (two) times daily as needed for allergies or rhinitis.    [provider]  ?furosemide (LASIX) 20 MG tablet Take 1 tablet (20 mg total) by mouth daily. 11/16/21 01/25/22  Max Sane, MD  ?icosapent Ethyl (VASCEPA) 1 g capsule Take 2 g by mouth 2 (two) times daily.    [provider]  ?isosorbide mononitrate (IMDUR) 30 MG 24 hr tablet Take 1 tablet (30 mg total) by mouth daily. 11/17/21 01/25/22  Max Sane, MD  ?levothyroxine (SYNTHROID) 112 MCG tablet Take 112 mcg by mouth daily. 02/23/21   [provider]  ?magnesium oxide (MAG-OX) 400 MG tablet Take 400 mg by mouth daily.    [provider]  ?metoprolol succinate (TOPROL-XL) 25 MG 24 hr tablet Take 1 tablet (25 mg total) by mouth daily. ?Patient not taking: Reported on 01/25/2022 10/28/21 11/27/21  Richarda Osmond, MD  ?mometasone-formoterol [facility]) 200-5 MCG/ACT AERO Inhale 2 puffs into the lungs 2 (two) times daily. 04/04/21   Mercy Riding, MD  ?omeprazole (PRILOSEC OTC) 20 MG tablet Take 20 mg by mouth daily.    [provider]  ?pravastatin (PRAVACHOL) 20 MG tablet Take 1 tablet (20 mg total) by mouth daily at 6 PM. 10/27/21 01/25/22  Richarda Osmond, MD   ?telmisartan (MICARDIS) 80 MG tablet Take 80 mg by mouth daily. 11/01/21   [provider]  ? ? ?Physical Exam: ?Vitals:  ? 02/08/22 2230 02/08/22 2300 02/08/22 2330 02/09/22 0000  ?BP: (!) 159/69 (!) 110/38 (!) 149/63 (!) 143/52  ?Pulse: 75 78 73 73  ?Resp: '14 13 13 10  ' ?Temp:    98.1 ?F (36.7 ?C)  ?TempSrc:    Oral  ?SpO2: 98% 96% 95% 94%  ?Weight:      ?Height:      ? ?Physical Exam ?Vitals and nursing note reviewed.  ?Constitutional:   ?   General: She is not in acute distress. ?HENT:  ?   Head: Normocephalic and atraumatic.  ?Cardiovascular:  ?   Rate and Rhythm: Normal rate and regular rhythm.  ?  Heart sounds: Normal heart sounds.  ?Pulmonary:  ?   Effort: Pulmonary effort is normal.  ?   Breath sounds: Normal breath sounds.  ?Abdominal:  ?   Palpations: Abdomen is soft.  ?   Tenderness: There is no abdominal tenderness.  ?Neurological:  ?   Mental Status: Mental status is at baseline.  ? ? ? ?Data Reviewed: ?Relevant notes from primary care and specialist visits, past discharge summaries as available in EHR, including Care Everywhere. ?Prior diagnostic testing as pertinent to current admission diagnoses ?Updated medications and problem lists for reconciliation ?ED course, including vitals, labs, imaging, treatment and response to treatment ?Triage notes, nursing and pharmacy notes and ED provider's notes ?Notable results as noted in HPI ? ? ?Assessment and Plan: ?* Acute CVA (cerebrovascular accident) ([place]) ?Recommendations per neurology: ?  ?      Stroke/Telemetry Floor ?      Neuro Checks ?      Bedside Swallow Eval ?      DVT Prophylaxis ?      IV Fluids, Normal Saline ?      Head of Bed 30 Degrees ?      Euglycemia and Avoid Hyperthermia (PRN Acetaminophen) ?      Initiate Clopidogrel 75 mg daily ?      Antihypertensives PRN if Blood pressure is greater than 220/120 or there is a concern for End organ damage/contraindications for permissive HTN. If blood pressure is greater than 220/120 give  labetalol PO or IV or Vasotec IV with a goal of 15% reduction in BP during the first 24 hours. ?      - MRI Brain without contrast ?      - TTE (Had echo January 2023 showing improved EF to 55% from 35%) ?      - Labs

## 2022-02-10 DIAGNOSIS — I639 Cerebral infarction, unspecified: Secondary | ICD-10-CM | POA: Diagnosis not present

## 2022-02-10 DIAGNOSIS — I10 Essential (primary) hypertension: Secondary | ICD-10-CM

## 2022-02-10 DIAGNOSIS — I4891 Unspecified atrial fibrillation: Secondary | ICD-10-CM | POA: Diagnosis present

## 2022-02-10 LAB — PROCALCITONIN: Procalcitonin: <0.1 ng/mL

## 2022-02-10 LAB — BASIC METABOLIC PANEL
Anion gap: 4 — ABNORMAL LOW (ref 5–15)
BUN: 16 mg/dL (ref 8–23)
CO2: 28 mmol/L (ref 22–32)
Calcium: 9.6 mg/dL (ref 8.9–10.3)
Chloride: 105 mmol/L (ref 98–111)
Creatinine, Ser: 0.96 mg/dL (ref 0.44–1.00)
GFR, Estimated: 58 mL/min — ABNORMAL LOW (ref >60)
Glucose, Bld: 95 mg/dL (ref 70–99)
Potassium: 4.4 mmol/L (ref 3.5–5.1)
Sodium: 137 mmol/L (ref 135–145)

## 2022-02-10 LAB — TROPONIN I (HIGH SENSITIVITY)
Troponin I (High Sensitivity): 19 ng/L — ABNORMAL HIGH (ref <18)
Troponin I (High Sensitivity): 18 ng/L — ABNORMAL HIGH (ref <18)

## 2022-02-10 MED ORDER — AMIODARONE HCL 200 MG PO TABS: 200.0000 mg | ORAL_TABLET | Freq: Every day | ORAL | Status: DC

## 2022-02-10 MED ORDER — METOPROLOL TARTRATE 25 MG PO TABS
25.0000 mg | ORAL_TABLET | Freq: Two times a day (BID) | ORAL | Status: DC
  Administered 2022-02-10: 25 mg via ORAL
  Filled 2022-02-10: qty 1

## 2022-02-10 MED ORDER — AMIODARONE HCL 200 MG PO TABS
200.0000 mg | ORAL_TABLET | Freq: Two times a day (BID) | ORAL | Status: DC
  Administered 2022-02-10: 200 mg via ORAL
  Filled 2022-02-10: qty 1

## 2022-02-10 MED ORDER — APIXABAN 5 MG PO TABS
5.0000 mg | ORAL_TABLET | Freq: Two times a day (BID) | ORAL | Status: DC
Start: 2022-02-10 — End: 2022-02-11
  Administered 2022-02-10 – 2022-02-11 (×2): 5 mg via ORAL
  Filled 2022-02-10 (×2): qty 1

## 2022-02-10 NOTE — Progress Notes (Signed)
Physical Therapy Treatment ?Patient Details ?Name: Andrea Phillips ?MRN: [id number] ?DOB: 1935/08/10 ?Today's Date: 02/10/2022 ? ? ?History of Present Illness Patient is an 87 year old female with a PMH (+) for  hypertension, hyperlipidemia, CAD, CHF, and hypothyroidism who presented to the ED with stroke like symptoms. ? ?  ?PT Comments  ? ? Physical Therapy Treatment completed this date. Patient seemed more alert and willing to participate this session. Upon arriving to patients room, OT was talking with patient's sister while patient received a blood draw. Per conversation with OT and patient's sister, family is very adamant on going on. PT/OT chatting with patient on necessary equipment required for home with 24/7 assistance. Due to complaints of dizziness during OT session, and blood draw, orthostatics were taken. BP in supine: 140/89mHg, BP in sitting: 134/726mg, BP in standing: 130/8435m. Patient was able to complete supine<>sitting at supervision with no physical assistance required, however did require increased time/effort. Sit to stand from EOB with RW was completed at SBA/SUP. Additional 10 reps completed for strengthening. With encouragement, patient ambulated around the entire nurses station (~[id number] with RW at SBA/SUP with no LOB noted. Patient was left in room with all needs met and in reach and family present. Patient would continue to benefit from skilled physical therapy in order to optimize patient's return to PLOF. Updating d/c recommendation to HHPT upon discharge from acute hospitalization.  ?  ?Recommendations for follow up therapy are one component of a multi-disciplinary discharge planning process, led by the attending physician.  Recommendations may be updated based on patient status, additional functional criteria and insurance authorization. ? ?Follow Up Recommendations ? Home health PT ?  ?  ?Assistance Recommended at Discharge Frequent or constant Supervision/Assistance  ?Patient  can return home with the following A little help with walking and/or transfers;Help with stairs or ramp for entrance ?  ?Equipment Recommendations ? Rolling walker (2 wheels)  ?  ?Recommendations for Other Services   ? ? ?  ?Precautions / Restrictions Precautions ?Precautions: Fall ?Restrictions ?Weight Bearing Restrictions: No  ?  ? ?Mobility ? Bed Mobility ?Overal bed mobility: Needs Assistance ?Bed Mobility: Supine to Sit, Sit to Supine ?  ?  ?Supine to sit: Supervision ?Sit to supine: Supervision ?  ?  ?  ? ?Transfers ?Overall transfer level: Needs assistance ?Equipment used: Rolling walker (2 wheels) ?Transfers: Sit to/from Stand ?Sit to Stand: Supervision ?  ?  ?  ?  ?  ?  ?  ? ?Ambulation/Gait ?Ambulation/Gait assistance: Supervision ?Gait Distance (Feet): 160 Feet ?Assistive device: Rolling walker (2 wheels) ?Gait Pattern/deviations: Step-through pattern, Decreased step length - right, Decreased step length - left, Decreased stride length, Narrow base of support, Trunk flexed ?Gait velocity: decreased ?  ?  ?  ? ? ?Stairs ?  ?  ?  ?  ?  ? ? ?Wheelchair Mobility ?  ? ?Modified Rankin (Stroke Patients Only) ?  ? ? ?  ?Balance Overall balance assessment: Needs assistance ?Sitting-balance support: Bilateral upper extremity supported, Feet supported ?Sitting balance-Leahy Scale: Good ?Sitting balance - Comments: mild dizziness upon coming to sitting ?  ?Standing balance support: Bilateral upper extremity supported, During functional activity, Reliant on assistive device for balance ?Standing balance-Leahy Scale: Fair ?Standing balance comment: reliant on AD for stability ?  ?  ?  ?  ?  ?  ?  ?  ?  ?  ?  ?  ? ?  ?Cognition Arousal/Alertness: Awake/alert ?Behavior During Therapy: WFL [facility] tasks assessed/performed ?Overall  Cognitive Status: Within Functional Limits for tasks assessed ?  ?  ?  ?  ?  ?  ?  ?  ?  ?  ?  ?  ?  ?  ?  ?  ?General Comments: Pleasant to work with ?  ?  ? ?  ?Exercises Other Exercises ?Other  Exercises: x10 sit to stands from EOB with RW ? ?  ?General Comments General comments (skin integrity, edema, etc.): BP in supine: 140/25mhg, BP in sitting: 134/73, BP in standing: 130/84, HR ranged from 82-128bpm, SpO2 remained >90% ?  ?  ? ?Pertinent Vitals/Pain Pain Assessment ?Pain Assessment: 0-10 ?Pain Score: 2  ?Pain Location: head ?Pain Descriptors / Indicators: Aching, Constant ?Pain Intervention(s): Monitored during session, Limited activity within patient's tolerance, Repositioned  ? ? ?Home Living   ?  ?Available Help at Discharge: Family;Available PRN/intermittently ?Type of Home: House ?  ?  ?  ?  ?  ?  ?   ?  ?Prior Function    ?  ?  ?   ? ?PT Goals (current goals can now be found in the care plan section) Acute Rehab PT Goals ?PT Goal Formulation: Patient unable to participate in goal setting ?Time For Goal Achievement: 02/23/22 ?Potential to Achieve Goals: Fair ?Progress towards PT goals: Progressing toward goals ? ?  ?Frequency ? ? ? 7X/week ? ? ? ?  ?PT Plan Discharge plan needs to be updated  ? ? ?Co-evaluation   ?  ?  ?  ?  ? ?  ?AM-PAC PT "6 Clicks" Mobility   ?Outcome Measure ? Help needed turning from your back to your side while in a flat bed without using bedrails?: A Little ?Help needed moving from lying on your back to sitting on the side of a flat bed without using bedrails?: A Little ?Help needed moving to and from a bed to a chair (including a wheelchair)?: A Little ?Help needed standing up from a chair using your arms (e.g., wheelchair or bedside chair)?: A Little ?Help needed to walk in hospital room?: A Little ?Help needed climbing 3-5 steps with a railing? : A Little ?6 Click Score: 18 ? ?  ?End of Session Equipment Utilized During Treatment: Gait belt ?Activity Tolerance: Patient tolerated treatment well ?Patient left: in bed;with call bell/phone within reach;with bed alarm set;with family/visitor present ?Nurse Communication: Mobility status ?PT Visit Diagnosis: Unsteadiness on  feet (R26.81);Muscle weakness (generalized) (M62.81);Difficulty in walking, not elsewhere classified (R26.2) ?  ? ? ?Time: [address]?PT Time Calculation (min) (ACUTE ONLY): 21 min ? ?Charges:  $Gait Training: 8-22 mins          ?          ? ?CIva Boop PT  ?02/10/22. 11:35 AM ? ? ?

## 2022-02-10 NOTE — Progress Notes (Signed)
Holding metoprolol and amiodarone for now per Dorothyann Pengwayne Callwood, MD due to bradycardia. ?

## 2022-02-10 NOTE — Progress Notes (Signed)
Patient HR fluctuating between 37-72 BPM. Tele monitoring called to report a 2.6 second pause. MD notified, On-call Cardiology notified. Orders received via secure chat to HOLD next dose of Pacerone and Metoprolol. Handoff given to night shift RN, [place] ?

## 2022-02-10 NOTE — Consult Note (Signed)
?                    NEURO HOSPITALIST CONSULT NOTE  ? ?Requestig physician: Dr. Nelson Chimes ? ?Reason for Consult: Slurred speech and right sided weakness ? ?History obtained from:  Patient, Sister and Chart    ? ?HPI:                                                                                                                                         ? Andrea Phillips is an 87 y.o. female with a PMHx including CAD, CHF, headache, HTN, hypothyroidism ans sleep apnea, who presented to the ED on Tuesday night with AMS. Per EMS, she had told family at dinner time that she was feeling unwell. Family noted that she appeared much weaker than usual and was "not acting right". She went to lay down but her condition worsened and EMS was called. On arrival, EMS noted that the patient was having difficulty following commands as well as possible right-sided weakness. On arrival to the ED, she was noted to be somnolent; she was able to answer orientation questions but was having difficulty following commands. There was significant drift in her RUE and RLE. A Code Stroke was activated and Teleneurology evaluated the patient. CT head was negative for acute abnormality. TNK was discussed but family refused as patient appeared to be improving. CTA was negative for LVO, but CTP revealed an evolving left PCA infarct. Unfortunately, due to the time required to obtain the CTA/CTP, the patient was out of the TNK time window at the time the studies resulted.  ? ?Of note, she is allergic to ASA.  ? ?Past Medical History:  ?Diagnosis Date  ? Arthritis   ? ra  ? Asthma   ? CHF (congestive heart failure) (HCC)   ? Dysrhythmia   ? Edema extremities   ? GERD (gastroesophageal reflux disease)   ? Gout   ? Headache   ? History of hiatal hernia   ? Hypertension   ? Hypothyroidism   ? Shortness of breath dyspnea   ? Sleep apnea   ? ? ?Past Surgical History:  ?Procedure Laterality Date  ? APPENDECTOMY    ? BACK SURGERY    ? CARDIAC CATHETERIZATION     ? CATARACT EXTRACTION W/PHACO Left 03/09/2015  ? Procedure: CATARACT EXTRACTION PHACO AND INTRAOCULAR LENS PLACEMENT (IOC);  Surgeon: Sallee Lange, MD;  Location: [facility];  Service: Ophthalmology;  Laterality: Left;  [place] 01:31 ?AP% 26.1 ?CDE 43.72  ? CATARACT EXTRACTION W/PHACO Right 08/19/2021  ? Procedure: CATARACT EXTRACTION PHACO AND INTRAOCULAR LENS PLACEMENT (IOC) RIGHT;  Surgeon: Galen Manila, MD;  Location: [facility];  Service: Ophthalmology;  Laterality: Right;  12.31 ?1:07.0  ? CATARACT EXTRACTION W/PHACO Right 09/07/2021  ? Procedure: REMOVAL OF LENS FRAGMENTS RIGHT;  Surgeon: Galen Manila, MD;  Location: [facility];  Service: Ophthalmology;  Laterality: Right;  ? CHOLECYSTECTOMY    ? CORONARY/GRAFT ACUTE MI REVASCULARIZATION N/A 10/24/2021  ? Procedure: Coronary/Graft Acute MI Revascularization;  Surgeon: Yvonne KendallEnd, Christopher, MD;  Location: [facility];  Service: Cardiovascular;  Laterality: N/A;  ? EYE SURGERY    ? retina  ? JOINT REPLACEMENT    ? left knee  ? LEFT HEART CATH AND CORONARY ANGIOGRAPHY N/A 04/02/2021  ? Procedure: LEFT HEART CATH AND CORONARY ANGIOGRAPHY;  Surgeon: Lamar BlinksKowalski, Bruce J, MD;  Location: [facility];  Service: Cardiovascular;  Laterality: N/A;  ? LEFT HEART CATH AND CORONARY ANGIOGRAPHY N/A 10/24/2021  ? Procedure: LEFT HEART CATH AND CORONARY ANGIOGRAPHY;  Surgeon: Yvonne KendallEnd, Christopher, MD;  Location: [facility];  Service: Cardiovascular;  Laterality: N/A;  ? ? ?Family History  ?Problem Relation Age of Onset  ? Heart disease Mother   ? Heart disease Father   ? Breast cancer Cousin   ?          ? ?Social History:  reports that she has never smoked. She has never used smokeless tobacco. She reports that she does not drink alcohol and does not use drugs. ? ?Allergies  ?Allergen Reactions  ? Aspirin Hives  ? Other Hives  ?  Yellow dye 6 ?FOOD COLOR YELLOW POWDER ?  ? Atorvastatin Other (See Comments)  ?  unknown  ? Codeine Other (See  Comments)  ?  Doesn't know  ? Conj Estrog-Medroxyprogest Ace Other (See Comments)  ?  PREMPRO 0.3-1.5 MG ORAL TABLET ?unknown  ? Prednisone Other (See Comments)  ?  Chest pain  ? Raloxifene Other (See Comments)  ?  EVISTA 60 MG ORAL TABLET ?unknown  ? Ace Inhibitors Cough  ? Amlodipine Itching  ? Valsartan Itching  ? ? ?MEDICATIONS:                                                                                                                     ?Prior to Admission:  ?Medications Prior to Admission  ?Medication Sig Dispense Refill Last Dose  ? albuterol (VENTOLIN HFA) 108 (90 Base) MCG/ACT inhaler Inhale 2 puffs into the lungs every 6 (six) hours as needed for wheezing or shortness of breath.   prn  ? cetirizine (ZYRTEC) 10 MG tablet Take 10 mg by mouth daily.   02/08/2022  ? fluticasone (FLONASE) 50 MCG/ACT nasal spray Place 1 spray into both nostrils 2 (two) times daily as needed for allergies or rhinitis.   02/08/2022  ? furosemide (LASIX) 20 MG tablet Take 1 tablet (20 mg total) by mouth daily. 30 tablet 0 02/08/2022  ? icosapent Ethyl (VASCEPA) 1 g capsule Take 2 g by mouth 2 (two) times daily.   02/08/2022  ? isosorbide mononitrate (IMDUR) 30 MG 24 hr tablet Take 1 tablet (30 mg total) by mouth daily. 30 tablet 0 02/08/2022  ? levothyroxine (SYNTHROID) 112 MCG tablet Take 112 mcg by mouth daily.   02/08/2022  ? mometasone-formoterol (DULERA) 200-5 MCG/ACT AERO Inhale 2 puffs  into the lungs 2 (two) times daily. 1 each 1 02/08/2022  ? omeprazole (PRILOSEC OTC) 20 MG tablet Take 20 mg by mouth daily.   02/08/2022  ? pravastatin (PRAVACHOL) 20 MG tablet Take 1 tablet (20 mg total) by mouth daily at 6 PM. 30 tablet 0 02/08/2022  ? telmisartan (MICARDIS) 80 MG tablet Take 80 mg by mouth daily.   02/08/2022  ? magnesium oxide (MAG-OX) 400 MG tablet Take 400 mg by mouth daily. (Patient not taking: Reported on 02/09/2022)   Not Taking  ? metoprolol succinate (TOPROL-XL) 25 MG 24 hr tablet Take 1 tablet (25 mg total) by mouth  daily. (Patient not taking: Reported on 01/25/2022) 30 tablet 0   ? ?Scheduled: ?  stroke: early stages of recovery book   Does not apply Once  ? clopidogrel  75 mg Oral Daily  ? enoxaparin (LOVENOX) injection  40 mg Subcutaneous Q24H  ? furosemide  20 mg Oral Daily  ? levothyroxine  112 mcg Oral Q0600  ? ? ? ?ROS:                                                                                                                                       ?As per HPI. ? ? ?Blood pressure 129/67, pulse 78, temperature 98.8 ?F (37.1 ?C), temperature source Oral, resp. rate 16, height  (1.6 m), weight 78.2 kg, SpO2 91 %. ? ? ?General Examination:                                                                                                      ? ?Physical Exam  ?HEENT-  [place]/AT   ?Lungs- Respirations unlabored ?Extremities- Warm and well-perfused ? ?Neurological Examination ?Mental Status: Awake and alert. Oriented to the city, state, year and day of the week, but not the month ("May"). Speech is fluent with intact comprehension. Able to name a thumb and pinky finger, but had difficulty identifying an index finger.   ?Cranial Nerves: ?II: Temporal visual fields intact with no extinction to DSS. PERRL.   ?III,IV, VI: EOMI. No nystagmus. No ptosis.  ?V: Temp sensation intact bilaterally ?VII: Smile symmetric ?VIII: Hearing intact to voice ?IX,X: No hypophonia or hoarseness ?XI: Symmetric shoulder shrug ?XII: Midline tongue extension ?Motor: ?Right : Upper extremity   5/5    Left:     Upper extremity   5/5 ? Lower extremity   5/5     Lower extremity   5/5 ?Sensory: Temperature and light  touch sensation  intact throughout, bilaterally ?Deep Tendon Reflexes: 1-2+ and symmetric throughout ?Plantars: Right: downgoing   Left: downgoing ?Cerebellar: No ataxia with FNF bilaterally ?Gait: Deferred ?  ?Lab Results: ?Basic Metabolic Panel: ?Recent Labs  ?Lab 02/08/22 ?2118 02/10/22 ?0415  ?NA 134* 137  ?K 4.4 4.4  ?CL 100 105  ?CO2 26 28   ?GLUCOSE 95 95  ?BUN 27* 16  ?CREATININE 1.15* 0.96  ?CALCIUM 9.8 9.6  ?MG 2.2  --   ? ? ?CBC: ?Recent Labs  ?Lab 02/08/22 ?2118  ?WBC 9.1  ?NEUTROABS 5.0  ?HGB 13.8  ?HCT 41.7  ?MCV 92.5  ?PLT 280  ? ? ?Card

## 2022-02-10 NOTE — Assessment & Plan Note (Signed)
No complaints of chest pain and EKG nonacute ?S/p cath January 2023 for severe single-vessel CAD ?Repeat echocardiogram with regional wall motion abnormalities and barely positive troponin. ?No need for ischemic work-up while inpatient per cardiology. ?-Continue statin ?-Outpatient cardiology follow-up ? ?

## 2022-02-10 NOTE — Evaluation (Signed)
Occupational Therapy Evaluation ?Patient Details ?Name: Andrea Phillips ?MRN: [id number] ?DOB: 08-02-1935 ?Today's Date: 02/10/2022 ? ? ?History of Present Illness Patient is an 87 year old female with a PMH (+) for  hypertension, hyperlipidemia, CAD, CHF, and hypothyroidism who presented to the ED with stroke like symptoms.  ? ?Clinical Impression ?  ?Pt was seen for OT evaluation this date. Prior to hospital admission, pt was living at home and her granddaughter lives with her but works during the day. Sister present for session. Pt lives on the main floor of her 2 story home and denies falls but sister reminds her she had one last month resulting in an ED visit. Pt alert and oriented to self, bday, year, president, and name of hospital. Reports it's June and that she's here for chest pain. Pt denies chest pain at this time. No unilateral strength, coordination or sensory deficits appreciated. Questionable visual quadrant testing. Pt demos decreased strength over all, impaired balance (2 slight LOB with standing and marching), and currently requiring RW for ADL transfers and mobility. She requires MIN A for LB ADL tasks involving transfers. Able to doff/don socks seated EOB. Pt HR 90's to 142 during session. Does endorse mild dizziness with initial movements. Pt/sister educated in need for RW for mobility at this time 2/2 impairments as well as need for 24/7 sup/assist for safety. Pt eager to return home. Pt would benefit from skilled OT services to address noted impairments and functional limitations (see below for any additional details) in order to maximize safety and independence while minimizing falls risk and caregiver burden. Upon hospital discharge, recommend HHOT to maximize pt safety and return to functional independence during meaningful occupations of daily life.  ? ?Recommendations for follow up therapy are one component of a multi-disciplinary discharge planning process, led by the attending  physician.  Recommendations may be updated based on patient status, additional functional criteria and insurance authorization.  ? ?Follow Up Recommendations ? Home health OT  ?  ?Assistance Recommended at Discharge Frequent or constant Supervision/Assistance  ?Patient can return home with the following A little help with walking and/or transfers;A little help with bathing/dressing/bathroom;Assistance with cooking/housework;Assist for transportation;Direct supervision/assist for medications management;Direct supervision/assist for financial management;Help with stairs or ramp for entrance ? ?  ?Functional Status Assessment ? Patient has had a recent decline in their functional status and demonstrates the ability to make significant improvements in function in a reasonable and predictable amount of time.  ?Equipment Recommendations ? BSC/3in1;Tub/shower bench  ?  ?Recommendations for Other Services   ? ? ?  ?Precautions / Restrictions Precautions ?Precautions: Fall ?Restrictions ?Weight Bearing Restrictions: No  ? ?  ? ?Mobility Bed Mobility ?Overal bed mobility: Needs Assistance ?Bed Mobility: Supine to Sit, Sit to Supine ?  ?  ?Supine to sit: Supervision ?Sit to supine: Supervision ?  ?  ?  ? ?Transfers ?Overall transfer level: Needs assistance ?Equipment used: Rolling walker (2 wheels) ?Transfers: Sit to/from Stand ?Sit to Stand: Min guard ?  ?  ?  ?  ?  ?General transfer comment: CGA-MIN A to stand without UE support, improved with RW for UE support ?  ? ?  ?Balance Overall balance assessment: Needs assistance ?Sitting-balance support: Bilateral upper extremity supported, Feet supported, Single extremity supported ?Sitting balance-Leahy Scale: Good ?Sitting balance - Comments: mild dizziness upon coming to sitting ?  ?Standing balance support: Bilateral upper extremity supported, During functional activity, Reliant on assistive device for balance ?Standing balance-Leahy Scale: Poor ?Standing balance comment:  2  slight LOB noted with attempts to march in place without UE support requiring CGA-MIN A to correct ?  ?  ?  ?  ?  ?  ?  ?  ?  ?  ?  ?   ? ?ADL either performed or assessed with clinical judgement  ? ?ADL Overall ADL's : Needs assistance/impaired ?  ?  ?  ?  ?  ?  ?  ?  ?  ?  ?  ?  ?  ?  ?  ?  ?  ?  ?  ?General ADL Comments: Pt required MIN A for LB ADL tasks involving STS transfers, able to doff socks with increased time/effort from seated position and propping LE on the bed, CGA for ADL transfers  ? ? ? ?Vision   ?   ?   ?Perception   ?  ?Praxis   ?  ? ?Pertinent Vitals/Pain Pain Assessment ?Pain Assessment: 0-10 ?Pain Score: 6  ?Pain Location: head ?Pain Descriptors / Indicators: Headache ?Pain Intervention(s): Limited activity within patient's tolerance, Monitored during session, Repositioned, Patient requesting pain meds-RN notified  ? ? ? ?Hand Dominance Right ?  ?Extremity/Trunk Assessment Upper Extremity Assessment ?Upper Extremity Assessment: Generalized weakness (no sensory deficits, no overt unilateral coordination deficits) ?  ?Lower Extremity Assessment ?Lower Extremity Assessment: Generalized weakness (no sensory deficits, no overt unilateral coordination deficits) ?  ?  ?  ?Communication Communication ?Communication: No difficulties ?  ?Cognition Arousal/Alertness: Awake/alert ?Behavior During Therapy: [facility] for tasks assessed/performed ?Overall Cognitive Status: Impaired/Different from baseline ?Area of Impairment: Orientation, Safety/judgement, Problem solving ?  ?  ?  ?  ?  ?  ?  ?  ?Orientation Level: Disoriented to, Time, Situation ?  ?  ?  ?Safety/Judgement: Decreased awareness of safety, Decreased awareness of deficits ?  ?Problem Solving: Requires verbal cues ?  ?  ?  ?General Comments  BP in supine: 140/15mmhg, BP in sitting: 134/73, BP in standing: 130/84, HR ranged from 82-128bpm, SpO2 remained >90% ? ?  ?Exercises Other Exercises ?Other Exercises: pt/sister instructed in falls prevention,  home/routines modifications, and need for AD for mobility to improve safety ?  ?Shoulder Instructions    ? ? ?Home Living Family/patient expects to be discharged to:: Private residence ?Living Arrangements: Other relatives ?Available Help at Discharge: Family;Available PRN/intermittently;Available 24 hours/day (granddtr works 7:30a-3:30p, sister is available to come be with pt from 7:15am-4pm) ?Type of Home: House ?Home Access: Stairs to enter ?Entrance Stairs-Number of Steps: 2-3 ?Entrance Stairs-Rails: Right ?Home Layout: Two level;Able to live on main level with bedroom/bathroom ?  ?  ?Bathroom Shower/Tub: Tub/shower unit ?  ?Bathroom Toilet: Standard ?Bathroom Accessibility: Yes ?  ?Home Equipment: Agricultural consultant (2 wheels);Cane - single point;Other (comment) (walking stick) ?  ?Additional Comments: sister endorses pt has had a fall ~17mo ago leading to ED admission (nothing broken, went back home) ? Lives With:  (granddaughter) ? ?  ?Prior Functioning/Environment Prior Level of Function : Independent/Modified Independent ?  ?  ?  ?  ?  ?  ?Mobility Comments: walking stick for community ambulation, pt endorses furniture walking in home ?  ?  ? ?  ?  ?OT Problem List: Decreased strength;Decreased activity tolerance;Impaired balance (sitting and/or standing);Decreased safety awareness;Decreased knowledge of use of DME or AE ?  ?   ?OT Treatment/Interventions: Self-care/ADL training;Therapeutic exercise;Therapeutic activities;DME and/or AE instruction;Patient/family education;Balance training;Cognitive remediation/compensation  ?  ?OT Goals(Current goals can be found in the care plan section) Acute Rehab OT Goals ?  Patient Stated Goal: go home ?OT Goal Formulation: With patient/family ?Time For Goal Achievement: 02/10/22 ?Potential to Achieve Goals: Good ?ADL Goals ?Pt Will Perform Lower Body Dressing: with modified independence;sit to/from stand;with caregiver independent in assisting ?Pt Will Transfer to Toilet:  with modified independence;with supervision;ambulating;regular height toilet (LRAD) ?Pt Will Perform Toileting - Clothing Manipulation and hygiene: with modified independence ?Additional ADL Goal #1: Pt will comple

## 2022-02-10 NOTE — Assessment & Plan Note (Signed)
EF of 50 to 55% in January 2023 up from prior of 30 to 35%, which is again down to 40 to 45% on repeat echocardiogram.  New onset A-fib which might be contributory. ?Also found to have regional wall motion abnormalities with barely positive troponin and no chest pain.  Cardiology was consulted and no plan for inpatient ischemic work-up, which can be done as an outpatient by her own cardiologist. ?Currently euvolemic ?-Restarted on home Lasix and metoprolol was added. ?

## 2022-02-10 NOTE — Care Management Important Message (Signed)
Important Message ? ?Patient Details  ?Name: Andrea Phillips ?MRN: [id number] ?Date of Birth: Jun 19, 1935 ? ? ?Medicare Important Message Given:  N/A - LOS <3 / Initial given by admissions ? ? ? ? ?[name] ?02/10/2022, 4:42 PM ?

## 2022-02-10 NOTE — Assessment & Plan Note (Signed)
BP controlled . ?Patient completed the duration of permissive hypertension. ?Restarted on home Lasix and metoprolol. ?Keep holding telmisartan-we can resume blood pressure trending up ?

## 2022-02-10 NOTE — Assessment & Plan Note (Signed)
Most likely the cause of her stroke.  New onset atrial fibrillation/flutter. ?Cardiology was consulted.  CHA2DS2-VASc score of 8 ?-She was started on amiodarone ?-She was also started on metoprolol ?-She was started on Eliquis ?

## 2022-02-10 NOTE — Progress Notes (Signed)
?Progress Note ? ? ?Patient: Andrea Phillips QTM:[id number] DOB: 1935/05/14 DOA: 02/08/2022     2 ?DOS: the patient was seen and examined on 02/10/2022 ?  ?Brief hospital course: ?Taken from prior notes. ? ?Andrea Phillips is a 87 y.o. female with medical history significant for HFrEF with improved EF, HLD, HTN OSA, hypothyroidism, CAD, brought in as code stroke after patient was noted to stumble and have slurred speech and right-sided weakness after having dinner.  Patient arrives within the tPA window NIHSS was 7 on arrival.  Initial head CT was negative.  The ED provider as well as neurologist on call discussed TNK with family members however the family opted against it and the decision was made to obtain CTA and if unremarkable to continue with stroke work-up.  CTA and CT cerebral perfusion showed no LVO or high-grade stenosis but did show a left PCA territory core infarct with area of ischemic penumbra.  By the time of the results, patient's NIHSS had improved to 3 but by which time patient no longer met criteria for TNK due to being outside the window and with improving symptoms.   ?She was treated with aspirin and hospitalist consulted for admission. ?Additional ED data reviewed: Lab work significant for creatinine of 1.15, up from 0.82 with troponin of 22 with other blood work unremarkable, COVID and flu negative.  Chest x-ray clear. ? ?MRI ordered-still pending. ?Repeated CT head due to persistent headache which was resistant to pain medications and it shows evolving left PCA territory infarct.  No evidence of hemorrhagic conversion and no midline shift. ?A1c of 5.4 ?Lipid panel within normal limit with LDL of 94 ?Echocardiogram done-with reduced EF of 40 to 45% and regional wall motion abnormalities along with grade 2 diastolic dysfunction. ?PT/OT now change recommendations to home health services. ?Patient was also found to have new onset atrial fibrillation/flutter-cardiology was consulted for this  new arrhythmia and new regional wall motion abnormalities.  Troponin barely positive with a downward trend, most likely secondary to demand ischemia with new stroke and new onset A-fib. ?CTA with no large vessel occlusion ? ?Cardiology started her on amiodarone and Eliquis.  Patient is allergic to aspirin and Plavix was discontinued after starting Eliquis.  Most likely has embolic stroke with new onset A-fib. ?CHA2DS2-VASc score of 8.  Currently rate controlled. ?No plan for inpatient ischemic work-up, patient will follow-up with her own cardiologist for further recommendations on discharge. ? ? ? ?Assessment and Plan: ?* Acute CVA (cerebrovascular accident) ([place]) ?Patient with left PCA territory infarct with another acute infarct in left cerebellum. ?No focal neurologic deficit. ?Most likely embolic in the setting of new onset atrial fibrillation/flutter. ?Patient is allergic to aspirin and initially was started on Plavix which was discontinued later by neurology as she will be on Eliquis now. ?-Continue with statin ?-PT/OT are now recommending home health services ?-Patient will follow-up with neurology as an outpatient in 2 to 4 weeks ? ? ?Atrial fibrillation and flutter ([place]) ?Most likely the cause of her stroke.  New onset atrial fibrillation/flutter. ?Cardiology was consulted.  CHA2DS2-VASc score of 8 ?-She was started on amiodarone ?-She was also started on metoprolol ?-She was started on Eliquis ? ?Chronic HFrEF (heart failure with reduced ejection fraction)  with improved EF(HCC) ?EF of 50 to 55% in January 2023 up from prior of 30 to 35%, which is again down to 40 to 45% on repeat echocardiogram.  New onset A-fib which might be contributory. ?Also found to have  regional wall motion abnormalities with barely positive troponin and no chest pain.  Cardiology was consulted and no plan for inpatient ischemic work-up, which can be done as an outpatient by her own cardiologist. ?Currently euvolemic ?-Restarted on  home Lasix and metoprolol was added. ? ?AKI (acute kidney injury) ([place]) ? Resolved. ?-Monitor renal function ?-Avoid nephrotoxins ? ?Coronary artery disease involving native coronary artery of native heart without angina pectoris ?No complaints of chest pain and EKG nonacute ?S/p cath January 2023 for severe single-vessel CAD ?Repeat echocardiogram with regional wall motion abnormalities and barely positive troponin. ?No need for ischemic work-up while inpatient per cardiology. ?-Continue statin ?-Outpatient cardiology follow-up ? ? ?Hypothyroidism ?-Continue levothyroxine ? ?OSA (obstructive sleep apnea) ?Continue CPAP ? ?Essential hypertension ?BP controlled . ?Patient completed the duration of permissive hypertension. ?Restarted on home Lasix and metoprolol. ?Keep holding telmisartan-we can resume blood pressure trending up ? ? ?Subjective: Patient continued to have some headache, stating that it is better than before.  Denies any chest pain or shortness of breath.  No focal deficit. ? ?Physical Exam: ?Vitals:  ? 02/10/22 0752 02/10/22 1148 02/10/22 1557 02/10/22 1629  ?BP: 129/67 (!) 169/71  135/77  ?Pulse: 78 (!) 51 76 78  ?Resp: '16 16  17  ' ?Temp: 98.8 ?F (37.1 ?C) 98.9 ?F (37.2 ?C)  98.7 ?F (37.1 ?C)  ?TempSrc: Oral Oral  Oral  ?SpO2: 91% 97%  96%  ?Weight:      ?Height:      ? ?General.     In no acute distress. ?Pulmonary.  Lungs clear bilaterally, normal respiratory effort. ?CV.  Irregularly irregular ?Abdomen.  Soft, nontender, nondistended, BS positive. ?CNS.  Alert and oriented .  No focal neurologic deficit. ?Extremities.  No edema, no cyanosis, pulses intact and symmetrical. ?Psychiatry.  Judgment and insight appears normal. ? ?Data Reviewed: ?Prior notes and labs reviewed ? ?Family Communication: Called sister with no response ? ?Disposition: ?Status is: Inpatient ?Remains inpatient appropriate because: Severity of illness ? ? Planned Discharge Destination: Home with Home Health ? ?DVT prophylaxis.   Eliquis ?Time spent: 50 minutes ? ?This record has been created using Systems analyst. Errors have been sought and corrected,but may not always be located. Such creation errors do not reflect on the standard of care. ? ?Author: ?Lorella Nimrod, MD ?02/10/2022 5:09 PM ? ?For on call review [email or web address].  ?

## 2022-02-10 NOTE — TOC Progression Note (Addendum)
Transition of Care (TOC) - Progression Note  ? ? ?Patient Details  ?Name: Andrea SmallDelores C Phillips ?MRN: [id number] ?Date of Birth: 08/15/1935 ? ?Transition of Care (TOC) CM/SW Contact  ?Margarito LinerSarah C Antino Mayabb, LCSW ?Phone Number: ?02/10/2022, 12:14 PM ? ?Clinical Narrative:  PT has changed recommendation to home health. Patient and sister are agreeable. [facility] is first preference but they do not have speech available. [facility] is reviewing referral for PT, OT, ST, RN. Patient already has a RW. ? ?1:02 pm: [facility] has accepted referral. Patient and her sister are aware and agreeable. ? ?Expected Discharge Plan: Skilled Nursing Facility ?Barriers to Discharge: Continued Medical Work up ? ?Expected Discharge Plan and Services ?Expected Discharge Plan: Skilled Nursing Facility ?In-house Referral: NA ?  ?Post Acute Care Choice: Skilled Nursing Facility ?Living arrangements for the past 2 months: Single Family Home ?                ?  ?  ?  ?  ?  ?  ?  ?  ?  ?  ? ? ?Social Determinants of Health (SDOH) Interventions ?  ? ?Readmission Risk Interventions ?   ? View : No data to display.  ?  ?  ?  ? ? ?

## 2022-02-10 NOTE — Assessment & Plan Note (Signed)
Resolved. -Monitor renal function -Avoid nephrotoxins 

## 2022-02-10 NOTE — Assessment & Plan Note (Signed)
Patient with left PCA territory infarct with another acute infarct in left cerebellum. ?No focal neurologic deficit. ?Most likely embolic in the setting of new onset atrial fibrillation/flutter. ?Patient is allergic to aspirin and initially was started on Plavix which was discontinued later by neurology as she will be on Eliquis now. ?-Continue with statin ?-PT/OT are now recommending home health services ?-Patient will follow-up with neurology as an outpatient in 2 to 4 weeks ? ?

## 2022-02-10 NOTE — Evaluation (Signed)
Speech Language Pathology Evaluation Patient Details Name: Andrea Phillips MRN: [id number] DOB: 04-Jul-1935 Today's Date: 02/10/2022 Time: 0820-0850 SLP Time Calculation (min) (ACUTE ONLY): 30 min  Problem List:  Patient Active Problem List   Diagnosis Date Noted   Acute CVA (cerebrovascular accident) (HCC) 02/08/2022   Sleep apnea with use of continuous positive airway pressure (CPAP) 02/08/2022   Hypotension    Elevated lactic acid level    AKI (acute kidney injury) (HCC) 11/15/2021   Diarrhea 11/15/2021   Coronary artery disease involving native coronary artery of native heart without angina pectoris 11/05/2021   Chest pain    H/O medication noncompliance 10/24/2021   CAP (community acquired pneumonia) 08/31/2021   Multifocal pneumonia 08/29/2021   Chronic HFrEF (heart failure with reduced ejection fraction)  with improved EF(HCC) 04/02/2021   Hypothyroidism 04/02/2021   Syncope and collapse 04/07/2020   Right hip pain 04/07/2020   Frequent PVCs 04/07/2020   OSA (obstructive sleep apnea) 04/07/2020   Elevated troponin 04/07/2020   Varicose veins of both lower extremities with pain 04/04/2017   Essential hypertension 04/04/2017   Hyperlipidemia, mixed 11/22/2006   Past Medical History:  Past Medical History:  Diagnosis Date   Arthritis    ra   Asthma    CHF (congestive heart failure) (HCC)    Dysrhythmia    Edema extremities    GERD (gastroesophageal reflux disease)    Gout    Headache    History of hiatal hernia    Hypertension    Hypothyroidism    Shortness of breath dyspnea    Sleep apnea    Past Surgical History:  Past Surgical History:  Procedure Laterality Date   APPENDECTOMY     BACK SURGERY     CARDIAC CATHETERIZATION     CATARACT EXTRACTION W/PHACO Left 03/09/2015   Procedure: CATARACT EXTRACTION PHACO AND INTRAOCULAR LENS PLACEMENT (IOC);  Surgeon: Sallee Lange, MD;  Location: [facility];  Service: Ophthalmology;  Laterality: Left;  [place]  01:31 AP% 26.1 CDE 43.72   CATARACT EXTRACTION W/PHACO Right 08/19/2021   Procedure: CATARACT EXTRACTION PHACO AND INTRAOCULAR LENS PLACEMENT (IOC) RIGHT;  Surgeon: Galen Manila, MD;  Location: [facility];  Service: Ophthalmology;  Laterality: Right;  12.31 1:07.0   CATARACT EXTRACTION W/PHACO Right 09/07/2021   Procedure: REMOVAL OF LENS FRAGMENTS RIGHT;  Surgeon: Galen Manila, MD;  Location: [facility];  Service: Ophthalmology;  Laterality: Right;   CHOLECYSTECTOMY     CORONARY/GRAFT ACUTE MI REVASCULARIZATION N/A 10/24/2021   Procedure: Coronary/Graft Acute MI Revascularization;  Surgeon: Yvonne Kendall, MD;  Location: [facility];  Service: Cardiovascular;  Laterality: N/A;   EYE SURGERY     retina   JOINT REPLACEMENT     left knee   LEFT HEART CATH AND CORONARY ANGIOGRAPHY N/A 04/02/2021   Procedure: LEFT HEART CATH AND CORONARY ANGIOGRAPHY;  Surgeon: Lamar Blinks, MD;  Location: [facility];  Service: Cardiovascular;  Laterality: N/A;   LEFT HEART CATH AND CORONARY ANGIOGRAPHY N/A 10/24/2021   Procedure: LEFT HEART CATH AND CORONARY ANGIOGRAPHY;  Surgeon: Yvonne Kendall, MD;  Location: [facility];  Service: Cardiovascular;  Laterality: N/A;   HPI:  Per H&P "Andrea Phillips is a 87 y.o. female with medical history significant for HFrEF with improved EF, HLD, HTN OSA, hypothyroidism, CAD, brought in as code stroke after patient was noted to stumble and have slurred speech and right-sided weakness after having dinner.  Patient arrives within the tPA window NIHSS was  7 on arrival.  Initial head CT was negative.  The ED provider as well as neurologist on call discussed TNK with family members however the family opted against it and the decision was made to obtain CTA and if unremarkable to continue with stroke work-up.  CTA and CT cerebral perfusion showed no LVO or high-grade stenosis but did show a left PCA territory core infarct with area  of ischemic penumbra.  By the time of the results, patient's NIHSS had improved to 3 but by which time patient no longer met criteria for TNK due to being outside the window and with improving symptoms.    She was treated with aspirin and hospitalist consulted for admission.  Additional ED data reviewed: Lab work significant for creatinine of 1.15, up from 0.82 with troponin of 22 with other blood work unremarkable, COVID and flu negative.  Chest x-ray clear.  Neurology note reviewed and recommendations noted " MRI Head 02/09/22 "1. Restricted diffusion in the left occipital lobe, inferomedial  temporal lobe, left posterior and mid hippocampus, and left  thalamus, consistent with known left PCA territory infarct.  2. Additional acute infarct in the left cerebellum."   Assessment / Plan / Recommendation Clinical Impression  Pt seen for cognitive-linguistic evaluation. Pt alert and cooperative. Pt with c/o headache (RN aware), but agreeable to participation with SLP. No family present to provide details regarding cognitive-linguistic functioning PTA. Per pt, pt drives, handles finances, and handles medication PTA.  Evaluation completed via informal means and portions of Cognistat. Pt presents with cognitive-linguistic deficits affecting orientation (temporal, situational), memory (immediate, short term, functional), problem solving (verbal), and abstract reasoning (verbal). X1 instance of anomia noted during confrontation naming task; otherwise, pt's primary language abilities appeared [facility]. Pt with reduced insight into CLOF.   Recommend frequent/constant supervision/assistance and post-acute SLP services at d/c. No driving unless cleared by MD.  SLP to f/u per POC for cognitive-linguistic tx while pt in house.  Pt and RN made aware of results, recommendations, and SLP POC. Pt verbalized understanding/agreement; however, suspect need for reinforcement of content given cognitive-linguistic deficits. TOC made  aware of d/c recommendations.    SLP Assessment  SLP Recommendation/Assessment: Patient needs continued Speech Lanaguage Pathology Services SLP Visit Diagnosis: Cognitive communication deficit (R41.841)    Recommendations for follow up therapy are one component of a multi-disciplinary discharge planning process, led by the attending physician.  Recommendations may be updated based on patient status, additional functional criteria and insurance authorization.    Follow Up Recommendations  Home health SLP    Assistance Recommended at Discharge  Frequent or constant Supervision/Assistance  Functional Status Assessment Patient has had a recent decline in their functional status and demonstrates the ability to make significant improvements in function in a reasonable and predictable amount of time.  Frequency and Duration min 2x/week  2 weeks      SLP Evaluation Cognition  Overall Cognitive Status: No family/caregiver present to determine baseline cognitive functioning Arousal/Alertness: Awake/alert Orientation Level: Oriented to person;Oriented to place;Disoriented to time;Disoriented to situation Year:  (2002) Month: May Day of Week: Incorrect Memory: Impaired Memory Impairment: Storage deficit;Retrieval deficit;Decreased recall of new information Awareness: Impaired Problem Solving: Impaired Problem Solving Impairment: Verbal basic Executive Function: Reasoning Reasoning: Impaired Reasoning Impairment: Verbal complex Safety/Judgment: Impaired (due to reduced insight)       Comprehension  Auditory Comprehension Overall Auditory Comprehension: Appears within functional limits for tasks assessed Yes/No Questions: Within Functional Limits Commands: Within Functional Limits Conversation: Complex Other Conversation Comments:  extra time    Expression Expression Primary Mode of Expression: Verbal Verbal Expression Overall Verbal Expression: Appears within functional limits for  tasks assessed Initiation: No impairment Automatic Speech:  (WFL) Repetition: No impairment Naming:  (9/10 objects; 10/10 with circumlocution/gesturing) Pragmatics: No impairment Written Expression Dominant Hand: Right Written Expression: Not tested   Oral / Motor  Oral Motor/Sensory Function Overall Oral Motor/Sensory Function: Within functional limits Motor Speech Overall Motor Speech: Appears within functional limits for tasks assessed Respiration: Within functional limits Resonance: Within functional limits Articulation: Within functional limitis Intelligibility: Intelligible Motor Planning: Witnin functional limits Motor Speech Errors: Not applicable           Clyde Canterbury, M.S., CCC-SLP Speech-Language Pathologist [facility] [phone] (ASCOM)   Woodroe Chen 02/10/2022, 10:23 AM

## 2022-02-10 NOTE — Consult Note (Addendum)
?[facility] CARDIOLOGY CONSULT NOTE  ? ?    ?Patient ID: ?Andrea Phillips ?MRN: [id number] ?DOB/AGE: August 21, 1935 87 y.o. ? ?Admit date: 02/08/2022 ?Referring Physician Dr. Arnetha CourserSumayya Amin ?Primary Physician Dr. Yves DillNeelam Khan ?Primary Cardiologist Dr. Gwen PoundsKowalski ?Reason for Consultation reduced EF with inferior hypo ? ?HPI: The patient is an 87 year old female with a past medical history significant for CAD s/p cardiac cath 10/24/21 resulted with an occluded distal OM 3 (too small for PCI), 20% stenosed LAD, HFrEF (LVEF 40-45%, inferior hypokinesis, G2 DD 01/2022 -previously 50-55% 10/2021 without WMA), hyperlipidemia, frequent PVCs, hypertension, hypothyroidism, OSA on CPAP who presented [facility] ED 02/08/2022 as a code stroke with slurred speech, right-sided weakness and instability on her feet after having dinner.  She was found to have a left PCA territory infarct with an area of ischemic penumbra and acute cerebellar infarct on MRI 4/20.  Cardiology is consulted because her echo showed a reduced ejection fraction compared to January 2023.  Review of telemetry shows atrial fibrillation. ? ?The patient presented with slurred speech, right-sided weakness and instability on her feet.  Her family reported she appeared much weaker than usual and was "not acting right."  She presented to the ED within the TNK window but family opted against it as her symptoms were improving.  CTA was negative for LVO, but eventually CT perfusion showed a left PCA evolving infarct but was outside of the TNK window at that time. MRI brain showed left PCA infarct and acute cerebellar infarct on 02/10/2022.  An EKG was not performed on admission, but when performed today shows atrial flutter with controlled ventricular response at 80 bpm.  Review of telemetry goes back to the evening of 4/19 that shows patient in atrial fibrillation with rate in the 80s, during interview this afternoon shows periods of RVR with rates up to the 150s.  This appears  new in onset after review of prior records. ? ?Today the the patient presents with her sister who says she feels about the same as she did yesterday.  She said her speech is back to baseline and ambulated around the nurses station with a rolling walker without loss of balance per PT.  She says she has a slight headache, but denies chest pain, palpitations, dizziness, presyncope, shortness of breath, or lower extremity edema. ? ?She was recently seen by her regular cardiologist Dr. Gwen PoundsKowalski on 02/02/2022 where she was continued on medication regimen of clopidogrel 75 mg daily, pravastatin 20 mg and Vascepa, Lasix 40 mg daily, hydralazine 50 mg twice daily, and telmisartan 80 mg.  Her beta-blocker was previously discontinued due to concerns of bradycardia she has a known intolerance to aspirin. ? ?Labs are notable for high-sensitivity troponin minimally elevated and flat trending 22-34-19.  Creatinine improving from 1.15-0.96, GFR 58 today. ? ?Review of systems complete and found to be negative unless listed above  ? ? ? ?Past Medical History:  ?Diagnosis Date  ? Arthritis   ? ra  ? Asthma   ? CHF (congestive heart failure) (HCC)   ? Dysrhythmia   ? Edema extremities   ? GERD (gastroesophageal reflux disease)   ? Gout   ? Headache   ? History of hiatal hernia   ? Hypertension   ? Hypothyroidism   ? Shortness of breath dyspnea   ? Sleep apnea   ?  ?Past Surgical History:  ?Procedure Laterality Date  ? APPENDECTOMY    ? BACK SURGERY    ? CARDIAC CATHETERIZATION    ?  CATARACT EXTRACTION W/PHACO Left 03/09/2015  ? Procedure: CATARACT EXTRACTION PHACO AND INTRAOCULAR LENS PLACEMENT (IOC);  Surgeon: Sallee Lange, MD;  Location: [facility] ORS;  Service: Ophthalmology;  Laterality: Left;  [place] 01:31 ?AP% 26.1 ?CDE 43.72  ? CATARACT EXTRACTION W/PHACO Right 08/19/2021  ? Procedure: CATARACT EXTRACTION PHACO AND INTRAOCULAR LENS PLACEMENT (IOC) RIGHT;  Surgeon: Galen Manila, MD;  Location: [facility] OR[facility];  Service: Ophthalmology;   Laterality: Right;  12.31 ?1:07.0  ? CATARACT EXTRACTION W/PHACO Right 09/07/2021  ? Procedure: REMOVAL OF LENS FRAGMENTS RIGHT;  Surgeon: Galen Manila, MD;  [facility];  Service: Ophthalmology;  Laterality: Right;  ? CHOLECYSTECTOMY    ? CORONARY/GRAFT ACUTE MI REVASCULARIZATION N/A 10/24/2021  ? Procedure: Coronary/Graft Acute MI Revascularization;  Surgeon: Yvonne Kendall, MD;  Location: [facility];  Service: Cardiovascular;  Laterality: N/A;  ? EYE SURGERY    ? retina  ? JOINT REPLACEMENT    ? left knee  ? LEFT HEART CATH AND CORONARY ANGIOGRAPHY N/A 04/02/2021  ? Procedure: LEFT HEART CATH AND CORONARY ANGIOGRAPHY;  Surgeon: Lamar Blinks, MD;  Location: [facility];  Service: Cardiovascular;  Laterality: N/A;  ? LEFT HEART CATH AND CORONARY ANGIOGRAPHY N/A 10/24/2021  ? Procedure: LEFT HEART CATH AND CORONARY ANGIOGRAPHY;  Surgeon: Yvonne Kendall, MD;  Location: [facility];  Service: Cardiovascular;  Laterality: N/A;  ?  ?Medications Prior to Admission  ?Medication Sig Dispense Refill Last Dose  ? albuterol (VENTOLIN HFA) 108 (90 Base) MCG/ACT inhaler Inhale 2 puffs into the lungs every 6 (six) hours as needed for wheezing or shortness of breath.   prn  ? cetirizine (ZYRTEC) 10 MG tablet Take 10 mg by mouth daily.   02/08/2022  ? fluticasone (FLONASE) 50 MCG/ACT nasal spray Place 1 spray into both nostrils 2 (two) times daily as needed for allergies or rhinitis.   02/08/2022  ? furosemide (LASIX) 20 MG tablet Take 1 tablet (20 mg total) by mouth daily. 30 tablet 0 02/08/2022  ? icosapent Ethyl (VASCEPA) 1 g capsule Take 2 g by mouth 2 (two) times daily.   02/08/2022  ? isosorbide mononitrate (IMDUR) 30 MG 24 hr tablet Take 1 tablet (30 mg total) by mouth daily. 30 tablet 0 02/08/2022  ? levothyroxine (SYNTHROID) 112 MCG tablet Take 112 mcg by mouth daily.   02/08/2022  ? mometasone-formoterol (DULERA) 200-5 MCG/ACT AERO Inhale 2 puffs into the lungs 2 (two)  times daily. 1 each 1 02/08/2022  ? omeprazole (PRILOSEC OTC) 20 MG tablet Take 20 mg by mouth daily.   02/08/2022  ? pravastatin (PRAVACHOL) 20 MG tablet Take 1 tablet (20 mg total) by mouth daily at 6 PM. 30 tablet 0 02/08/2022  ? telmisartan (MICARDIS) 80 MG tablet Take 80 mg by mouth daily.   02/08/2022  ? magnesium oxide (MAG-OX) 400 MG tablet Take 400 mg by mouth daily. (Patient not taking: Reported on 02/09/2022)   Not Taking  ? metoprolol succinate (TOPROL-XL) 25 MG 24 hr tablet Take 1 tablet (25 mg total) by mouth daily. (Patient not taking: Reported on 01/25/2022) 30 tablet 0   ? ?Social History  ? ?Socioeconomic History  ? Marital status: Widowed  ?  Spouse name: Not on file  ? Number of children: Not on file  ? Years of education: Not on file  ? Highest education level: Not on file  ?Occupational History  ? Not on file  ?Tobacco Use  ? Smoking status: Never  ? Smokeless tobacco: Never  ?  Substance and Sexual Activity  ? Alcohol use: No  ? Drug use: No  ? Sexual activity: Not on file  ?Other Topics Concern  ? Not on file  ?Social History Narrative  ? Not on file  ? ?Social Determinants of Health  ? ?Financial Resource Strain: Not on file  ?Food Insecurity: Not on file  ?Transportation Needs: Not on file  ?Physical Activity: Not on file  ?Stress: Not on file  ?Social Connections: Not on file  ?Intimate Partner Violence: Not on file  ?  ?Family History  ?Problem Relation Age of Onset  ? Heart disease Mother   ? Heart disease Father   ? Breast cancer Cousin   ?  ? ? ?Review of systems complete and found to be negative unless listed above  ? ? ?PHYSICAL EXAM ?General: Pleasant elderly Caucasian female, well nourished, in no acute distress.  Sitting in bed with her sister ?HEENT:  Normocephalic and atraumatic. ?Neck:  No JVD.  ?Lungs: Normal respiratory effort on room air. Clear bilaterally to auscultation. No wheezes, crackles, rhonchi.  ?Heart: Tachy irregularly irregular rate and rhythm. Normal S1 and S2 without  gallops or murmurs. ?Abdomen: Non-distended appearing.  ?Msk: Normal strength and tone for age. ?Extremities: Warm and well perfused. No clubbing, cyanosis.  No peripheral edema.  ?Neuro: Alert and oriented

## 2022-02-10 NOTE — Consult Note (Incomplete)
?CARDIOLOGY CONSULT NOTE  ? ?   ?    ?  ? ? ? ?Patient ID: ?Andrea Phillips ?MRN: [id number] ?DOB/AGE: Dec 22, 1934 87 y.o. ? ?Admit date: 02/08/2022 ?Referring Physician *** ?Primary Physician *** ?Primary Cardiologist *** ?Reason for Consultation *** ? ?HPI: *** ? ?Review of systems complete and found to be negative unless listed above  ? ? ? ?Past Medical History:  ?Diagnosis Date  ? Arthritis   ? ra  ? Asthma   ? CHF (congestive heart failure) (HCC)   ? Dysrhythmia   ? Edema extremities   ? GERD (gastroesophageal reflux disease)   ? Gout   ? Headache   ? History of hiatal hernia   ? Hypertension   ? Hypothyroidism   ? Shortness of breath dyspnea   ? Sleep apnea   ?  ?Past Surgical History:  ?Procedure Laterality Date  ? APPENDECTOMY    ? BACK SURGERY    ? CARDIAC CATHETERIZATION    ? CATARACT EXTRACTION W/PHACO Left 03/09/2015  ? Procedure: CATARACT EXTRACTION PHACO AND INTRAOCULAR LENS PLACEMENT (IOC);  Surgeon: Sallee Lange, MD;  Location: [facility];  Service: Ophthalmology;  Laterality: Left;  [place] 01:31 ?AP% 26.1 ?CDE 43.72  ? CATARACT EXTRACTION W/PHACO Right 08/19/2021  ? Procedure: CATARACT EXTRACTION PHACO AND INTRAOCULAR LENS PLACEMENT (IOC) RIGHT;  Surgeon: Galen Manila, MD;  Location: [facility];  Service: Ophthalmology;  Laterality: Right;  12.31 ?1:07.0  ? CATARACT EXTRACTION W/PHACO Right 09/07/2021  ? Procedure: REMOVAL OF LENS FRAGMENTS RIGHT;  Surgeon: Galen Manila, MD;  Location: [facility];  Service: Ophthalmology;  Laterality: Right;  ? CHOLECYSTECTOMY    ? CORONARY/GRAFT ACUTE MI REVASCULARIZATION N/A 10/24/2021  ? Procedure: Coronary/Graft Acute MI Revascularization;  Surgeon: Yvonne Kendall, MD;  Location: [facility];  Service: Cardiovascular;  Laterality: N/A;  ? EYE SURGERY    ? retina  ? JOINT REPLACEMENT    ? left knee  ? LEFT HEART CATH AND CORONARY ANGIOGRAPHY N/A 04/02/2021  ? Procedure: LEFT HEART CATH AND CORONARY ANGIOGRAPHY;  Surgeon: Lamar Blinks, MD;  Location: [facility];  Service: Cardiovascular;  Laterality: N/A;  ? LEFT HEART CATH AND CORONARY ANGIOGRAPHY N/A 10/24/2021  ? Procedure: LEFT HEART CATH AND CORONARY ANGIOGRAPHY;  Surgeon: Yvonne Kendall, MD;  Location: [facility];  Service: Cardiovascular;  Laterality: N/A;  ?  ?Medications Prior to Admission  ?Medication Sig Dispense Refill Last Dose  ? albuterol (VENTOLIN HFA) 108 (90 Base) MCG/ACT inhaler Inhale 2 puffs into the lungs every 6 (six) hours as needed for wheezing or shortness of breath.   prn  ? cetirizine (ZYRTEC) 10 MG tablet Take 10 mg by mouth daily.   02/08/2022  ? fluticasone (FLONASE) 50 MCG/ACT nasal spray Place 1 spray into both nostrils 2 (two) times daily as needed for allergies or rhinitis.   02/08/2022  ? furosemide (LASIX) 20 MG tablet Take 1 tablet (20 mg total) by mouth daily. 30 tablet 0 02/08/2022  ? icosapent Ethyl (VASCEPA) 1 g capsule Take 2 g by mouth 2 (two) times daily.   02/08/2022  ? isosorbide mononitrate (IMDUR) 30 MG 24 hr tablet Take 1 tablet (30 mg total) by mouth daily. 30 tablet 0 02/08/2022  ? levothyroxine (SYNTHROID) 112 MCG tablet Take 112 mcg by mouth daily.   02/08/2022  ? mometasone-formoterol (DULERA) 200-5 MCG/ACT AERO Inhale 2 puffs into the lungs 2 (two) times daily. 1 each 1 02/08/2022  ? omeprazole (PRILOSEC OTC) 20  MG tablet Take 20 mg by mouth daily.   02/08/2022  ? pravastatin (PRAVACHOL) 20 MG tablet Take 1 tablet (20 mg total) by mouth daily at 6 PM. 30 tablet 0 02/08/2022  ? telmisartan (MICARDIS) 80 MG tablet Take 80 mg by mouth daily.   02/08/2022  ? magnesium oxide (MAG-OX) 400 MG tablet Take 400 mg by mouth daily. (Patient not taking: Reported on 02/09/2022)   Not Taking  ? metoprolol succinate (TOPROL-XL) 25 MG 24 hr tablet Take 1 tablet (25 mg total) by mouth daily. (Patient not taking: Reported on 01/25/2022) 30 tablet 0   ? ?Social History  ? ?Socioeconomic History  ? Marital status: Widowed  ?  Spouse name: Not on file  ?  Number of children: Not on file  ? Years of education: Not on file  ? Highest education level: Not on file  ?Occupational History  ? Not on file  ?Tobacco Use  ? Smoking status: Never  ? Smokeless tobacco: Never  ?Substance and Sexual Activity  ? Alcohol use: No  ? Drug use: No  ? Sexual activity: Not on file  ?Other Topics Concern  ? Not on file  ?Social History Narrative  ? Not on file  ? ?Social Determinants of Health  ? ?Financial Resource Strain: Not on file  ?Food Insecurity: Not on file  ?Transportation Needs: Not on file  ?Physical Activity: Not on file  ?Stress: Not on file  ?Social Connections: Not on file  ?Intimate Partner Violence: Not on file  ?  ?Family History  ?Problem Relation Age of Onset  ? Heart disease Mother   ? Heart disease Father   ? Breast cancer Cousin   ?  ? ? ?Review of systems complete and found to be negative unless listed above  ? ? ? ? ?PHYSICAL EXAM ? ?General: Well developed, well nourished, in no acute distress ?HEENT:  Normocephalic and atramatic ?Neck:  No JVD.  ?Lungs: Clear bilaterally to auscultation and percussion. ?Heart: HRRR . Normal S1 and S2 without gallops or murmurs.  ?Abdomen: Bowel sounds are positive, abdomen soft and non-tender  ?Msk:  Back normal, normal gait. Normal strength and tone for age. ?Extremities: No clubbing, cyanosis or edema.   ?Neuro: Alert and oriented X 3. ?Psych:  Good affect, responds appropriately ? ?Labs: ?  ?Lab Results  ?Component Value Date  ? WBC 9.1 02/08/2022  ? HGB 13.8 02/08/2022  ? HCT 41.7 02/08/2022  ? MCV 92.5 02/08/2022  ? PLT 280 02/08/2022  ?  ?Recent Labs  ?Lab 02/08/22 ?2118 02/10/22 ?0415  ?NA 134* 137  ?K 4.4 4.4  ?CL 100 105  ?CO2 26 28  ?BUN 27* 16  ?CREATININE 1.15* 0.96  ?CALCIUM 9.8 9.6  ?PROT 6.4*  --   ?BILITOT 0.8  --   ?ALKPHOS 60  --   ?ALT 15  --   ?AST 17  --   ?GLUCOSE 95 95  ? ?Lab Results  ?Component Value Date  ? CKTOTAL 113 04/02/2021  ?  ?Lab Results  ?Component Value Date  ? CHOL 171 02/09/2022  ? CHOL  185 10/25/2021  ? CHOL 170 04/02/2021  ? ?Lab Results  ?Component Value Date  ? HDL 53 02/09/2022  ? HDL 49 10/25/2021  ? HDL 46 04/02/2021  ? ?Lab Results  ?Component Value Date  ? LDLCALC 94 02/09/2022  ? LDLCALC 116 (H) 10/25/2021  ? LDLCALC 112 (H) 04/02/2021  ? ?Lab Results  ?Component Value Date  ? TRIG 121  02/09/2022  ? TRIG 99 10/25/2021  ? TRIG 60 04/02/2021  ? ?Lab Results  ?Component Value Date  ? CHOLHDL 3.2 02/09/2022  ? CHOLHDL 3.8 10/25/2021  ? CHOLHDL 3.7 04/02/2021  ? ?No results found for: LDLDIRECT  ?  ?Radiology: CT HEAD WO CONTRAST ( ) ? ?Result Date: 02/09/2022 ?CLINICAL DATA:  Headache, chronic, new features or increased frequency Patient with stroke, now with persistent headache which is resistant to pain medications EXAM: CT HEAD WITHOUT CONTRAST TECHNIQUE: Contiguous axial images were obtained from the base of the skull through the vertex without intravenous contrast. RADIATION DOSE REDUCTION: This exam was performed according to the departmental dose-optimization program which includes automated exposure control, adjustment of the mA and/or kV according to patient size and/or use of iterative reconstruction technique. COMPARISON:  CT 02/08/2022 FINDINGS: Brain: Evolving left PCA territory infarct.No evidence of hemorrhagic conversion.No midline shift. Patent basal cisterns. The ventricles are normal in size. Vascular: No hyperdense vessel.  Vascular calcifications. Skull: Negative for skull fracture. Sinuses/Orbits: No acute finding. Other: None. IMPRESSION: Evolving left PCA territory infarct. No evidence of hemorrhagic conversion. No midline shift. Electronically Signed   By: Caprice Renshaw M.D.   On: 02/09/2022 15:46  ? ?MR BRAIN WO CONTRAST ? ?Result Date: 02/09/2022 ?CLINICAL DATA:  Slurred speech, stumbling, stroke suspected EXAM: MRI HEAD WITHOUT CONTRAST TECHNIQUE: Multiplanar, multiecho pulse sequences of the brain and surrounding structures were obtained without intravenous  contrast. COMPARISON:  No prior MRI, correlation is made with CT head 02/08/2022 FINDINGS: Brain: Restricted diffusion with ADC correlate in the left occipital lobe, inferomedial temporal lobe, left posterior a

## 2022-02-11 DIAGNOSIS — I639 Cerebral infarction, unspecified: Secondary | ICD-10-CM | POA: Diagnosis not present

## 2022-02-11 LAB — CBC
HCT: 40 % (ref 36.0–46.0)
Hemoglobin: 13.4 g/dL (ref 12.0–15.0)
MCH: 30.9 pg (ref 26.0–34.0)
MCHC: 33.5 g/dL (ref 30.0–36.0)
MCV: 92.4 fL (ref 80.0–100.0)
Platelets: 254 10*3/uL (ref 150–400)
RBC: 4.33 MIL/uL (ref 3.87–5.11)
RDW: 12.4 % (ref 11.5–15.5)
WBC: 8.4 10*3/uL (ref 4.0–10.5)
nRBC: 0 % (ref 0.0–0.2)

## 2022-02-11 MED ORDER — PRAVASTATIN SODIUM 20 MG PO TABS
40.0000 mg | ORAL_TABLET | Freq: Every day | ORAL | Status: DC
  Administered 2022-02-11: 40 mg via ORAL
  Filled 2022-02-11: qty 2

## 2022-02-11 MED ORDER — TRAMADOL HCL 50 MG PO TABS: 50.0000 mg | ORAL_TABLET | Freq: Two times a day (BID) | ORAL | 0 refills | Status: DC | PRN

## 2022-02-11 MED ORDER — PRAVASTATIN SODIUM 40 MG PO TABS: 40.0000 mg | ORAL_TABLET | Freq: Every day | ORAL | 0 refills | Status: DC

## 2022-02-11 MED ORDER — AMIODARONE HCL 200 MG PO TABS
200.0000 mg | ORAL_TABLET | Freq: Every day | ORAL | 0 refills | Status: AC
Start: 2022-02-12 — End: ?

## 2022-02-11 MED ORDER — APIXABAN 5 MG PO TABS: 5.0000 mg | ORAL_TABLET | Freq: Two times a day (BID) | ORAL | 1 refills | Status: DC

## 2022-02-11 MED ORDER — AMIODARONE HCL 200 MG PO TABS
200.0000 mg | ORAL_TABLET | Freq: Every day | ORAL | Status: DC
Start: 2022-02-12 — End: 2022-02-11

## 2022-02-11 NOTE — Progress Notes (Incomplete)
[facility] Cardiology ? ? ? ?SUBJECTIVE: *** ? ? ?Vitals:  ? 02/11/22 0021 02/11/22 [id number] 02/11/22 [id number] 02/11/22 0932  ?BP: 127/60 134/63 139/65 (!) 151/62  ?Pulse: 63 62 63 (!) 56  ?Resp: 16 18 18    ?Temp: 98.6 ?F (37 ?C) 98 ?F (36.7 ?C) 97.9 ?F (36.6 ?C)   ?TempSrc: Oral Oral    ?SpO2: 94% 94% 96%   ?Weight:      ?Height:      ? ? ?No intake or output data in the 24 hours ending 02/11/22 1140 ? ? ? ?PHYSICAL EXAM ? ?General: Well developed, well nourished, in no acute distress ?HEENT:  Normocephalic and atramatic ?Neck:  No JVD.  ?Lungs: Clear bilaterally to auscultation and percussion. ?Heart: HRRR . Normal S1 and S2 without gallops or murmurs.  ?Abdomen: Bowel sounds are positive, abdomen soft and non-tender  ?Msk:  Back normal, normal gait. Normal strength and tone for age. ?Extremities: No clubbing, cyanosis or edema.   ?Neuro: Alert and oriented X 3. ?Psych:  Good affect, responds appropriately ? ? ?LABS: ?Basic Metabolic Panel: ?Recent Labs  ?  02/08/22 ?2118 02/10/22 ?0415  ?NA 134* 137  ?K 4.4 4.4  ?CL 100 105  ?CO2 26 28  ?GLUCOSE 95 95  ?BUN 27* 16  ?CREATININE 1.15* 0.96  ?CALCIUM 9.8 9.6  ?MG 2.2  --   ? ?Liver Function Tests: ?Recent Labs  ?  02/08/22 ?2118  ?AST 17  ?ALT 15  ?ALKPHOS 60  ?BILITOT 0.8  ?PROT 6.4*  ?ALBUMIN 3.6  ? ?No results for input(s): LIPASE, AMYLASE in the last 72 hours. ?CBC: ?Recent Labs  ?  02/08/22 ?2118 02/11/22 ?0425  ?WBC 9.1 8.4  ?NEUTROABS 5.0  --   ?HGB 13.8 13.4  ?HCT 41.7 40.0  ?MCV 92.5 92.4  ?PLT 280 254  ? ?Cardiac Enzymes: ?No results for input(s): CKTOTAL, CKMB, CKMBINDEX, TROPONINI in the last 72 hours. ?BNP: ?Invalid input(s): POCBNP ?D-Dimer: ?No results for input(s): DDIMER in the last 72 hours. ?Hemoglobin A1C: ?Recent Labs  ?  02/09/22 ?0202  ?HGBA1C 5.4  ? ?Fasting Lipid Panel: ?Recent Labs  ?  02/09/22 ?0202  ?CHOL 171  ?HDL 53  ?[place] 94  ?TRIG 121  ?CHOLHDL 3.2  ? ?Thyroid Function Tests: ?Recent Labs  ?  02/08/22 ?2118  ?TSH 1.281  ? ?Anemia Panel: ?No  results for input(s): VITAMINB12, FOLATE, FERRITIN, TIBC, IRON, RETICCTPCT in the last 72 hours. ? ?CT HEAD WO CONTRAST (5MM) ? ?Result Date: 02/09/2022 ?CLINICAL DATA:  Headache, chronic, new features or increased frequency Patient with stroke, now with persistent headache which is resistant to pain medications EXAM: CT HEAD WITHOUT CONTRAST TECHNIQUE: Contiguous axial images were obtained from the base of the skull through the vertex without intravenous contrast. RADIATION DOSE REDUCTION: This exam was performed according to the departmental dose-optimization program which includes automated exposure control, adjustment of the mA and/or kV according to patient size and/or use of iterative reconstruction technique. COMPARISON:  CT 02/08/2022 FINDINGS: Brain: Evolving left PCA territory infarct.No evidence of hemorrhagic conversion.No midline shift. Patent basal cisterns. The ventricles are normal in size. Vascular: No hyperdense vessel.  Vascular calcifications. Skull: Negative for skull fracture. Sinuses/Orbits: No acute finding. Other: None. IMPRESSION: Evolving left PCA territory infarct. No evidence of hemorrhagic conversion. No midline shift. Electronically Signed   By: Maurine Simmering M.D.   On: 02/09/2022 15:46  ? ?MR BRAIN WO CONTRAST ? ?Result Date: 02/09/2022 ?CLINICAL DATA:  Slurred speech, stumbling, stroke suspected EXAM: MRI  HEAD WITHOUT CONTRAST TECHNIQUE: Multiplanar, multiecho pulse sequences of the brain and surrounding structures were obtained without intravenous contrast. COMPARISON:  No prior MRI, correlation is made with CT head 02/08/2022 FINDINGS: Brain: Restricted diffusion with ADC correlate in the left occipital lobe, inferomedial temporal lobe, left posterior and mid hippocampus, and left thalamus (series 5, images 16-36), consistent with known left PCA territory infarct. Additional focus of restricted diffusion with ADC correlate in the left cerebellum (series 5, images 13-16). No acute  hemorrhage, mass, mass effect, or midline shift. Scattered T2 hyperintense signal in the periventricular white matter, likely the sequela of mild chronic small vessel ischemic disease. No hydrocephalus or extra-axial collection. Cerebral volume is within normal limits for age. Vascular: Normal flow voids. Skull and upper cervical spine: Normal marrow signal. Degenerative changes at C3-C4 with trace retrolisthesis. Sinuses/Orbits: Status post bilateral lens replacements. The paranasal sinuses are clear. Other: The mastoids are well aerated. IMPRESSION: 1. Restricted diffusion in the left occipital lobe, inferomedial temporal lobe, left posterior and mid hippocampus, and left thalamus, consistent with known left PCA territory infarct. 2. Additional acute infarct in the left cerebellum. Electronically Signed   By: Merilyn Baba M.D.   On: 02/09/2022 17:59  ? ?ECHOCARDIOGRAM COMPLETE ? ?Result Date: 02/09/2022 ?   ECHOCARDIOGRAM REPORT   Patient Name:   Andrea Phillips Date of Exam: 02/09/2022 Medical Rec #:  [id number]           Height:       63.0 in Accession #:    [id number]          Weight:       172.4 lb Date of Birth:  1935/04/22           BSA:          1.815 m? Patient Age:    48 years            BP:           164/66 mmHg Patient Gender: F                   HR:           58 bpm. Exam Location:  [facility] Procedure: 2D Echo, Cardiac Doppler and Color Doppler Indications:     Stroke I63.9  History:         Patient has prior history of Echocardiogram examinations, most                  recent 11/04/2021. CHF, Signs/Symptoms:Shortness of Breath and                  Dyspnea; Risk Factors:Hypertension.  Sonographer:     Sherrie Sport Referring Phys:  [id number] [facility] AMIN Diagnosing Phys: Yolonda Kida MD  Sonographer Comments: Suboptimal apical window and suboptimal subcostal window. IMPRESSIONS  1. Mild inferior hypo.  2. Left ventricular ejection fraction, by estimation, is 40 to 45%. The left ventricle has mildly  decreased function. The left ventricle demonstrates regional wall motion abnormalities (see scoring diagram/findings for description). Left ventricular diastolic parameters are consistent with Grade II diastolic dysfunction (pseudonormalization).  3. Right ventricular systolic function is normal. The right ventricular size is normal.  4. The mitral valve is normal in structure. No evidence of mitral valve regurgitation.  5. The aortic valve is normal in structure. Aortic valve regurgitation is not visualized. Aortic valve sclerosis is present, with no evidence of aortic valve stenosis. FINDINGS  Left Ventricle: Left ventricular ejection fraction,  by estimation, is 40 to 45%. The left ventricle has mildly decreased function. The left ventricle demonstrates regional wall motion abnormalities. The left ventricular internal cavity size was normal in size. There is no left ventricular hypertrophy. Left ventricular diastolic parameters are consistent with Grade II diastolic dysfunction (pseudonormalization). Right Ventricle: The right ventricular size is normal. No increase in right ventricular wall thickness. Right ventricular systolic function is normal. Left Atrium: Left atrial size was normal in size. Right Atrium: Right atrial size was normal in size. Pericardium: There is no evidence of pericardial effusion. Mitral Valve: The mitral valve is normal in structure. No evidence of mitral valve regurgitation. MV peak gradient, 5.0 mmHg. The mean mitral valve gradient is 2.0 mmHg. Tricuspid Valve: The tricuspid valve is normal in structure. Tricuspid valve regurgitation is trivial. Aortic Valve: The aortic valve is normal in structure. Aortic valve regurgitation is not visualized. Aortic valve sclerosis is present, with no evidence of aortic valve stenosis. Aortic valve mean gradient measures 4.7 mmHg. Aortic valve peak gradient measures 8.8 mmHg. Aortic valve area, by VTI measures 1.14 cm?. Pulmonic Valve: The pulmonic  valve was normal in structure. Pulmonic valve regurgitation is not visualized. Aorta: The ascending aorta was not well visualized. IAS/Shunts: No atrial level shunt detected by color flow Doppler. Additional Comments: M

## 2022-02-11 NOTE — TOC Progression Note (Signed)
Transition of Care (TOC) - Progression Note  ? ? ?Patient Details  ?Name: Andrea Phillips ?MRN: [id number] ?Date of Birth: 1935-10-10 ? ?Transition of Care (TOC) CM/SW Contact  ?Margarito LinerSarah C Trysten Bernard, LCSW ?Phone Number: ?02/11/2022, 10:18 AM ? ?Clinical Narrative:  OT recommending a 3-in-1 or tub/shower bench. Discussed with patient and sister and told them this is a private-pay item since she is not bed/room bound. Patient wants to think about it. Will follow up again this afternoon. Per notes, patient will discharge on new Eliquis. 30-day free coupon put in her discharge packet. Patient and sister are aware.  ? ?Expected Discharge Plan: Skilled Nursing Facility ?Barriers to Discharge: Continued Medical Work up ? ?Expected Discharge Plan and Services ?Expected Discharge Plan: Skilled Nursing Facility ?In-house Referral: NA ?  ?Post Acute Care Choice: Skilled Nursing Facility ?Living arrangements for the past 2 months: Single Family Home ?                ?  ?  ?  ?  ?  ?  ?  ?  ?  ?  ? ? ?Social Determinants of Health (SDOH) Interventions ?  ? ?Readmission Risk Interventions ?   ? View : No data to display.  ?  ?  ?  ? ? ?

## 2022-02-11 NOTE — Progress Notes (Signed)
Physical Therapy Treatment ?Patient Details ?Name: LORENDA GRECCO ?MRN: [id number] ?DOB: 10-16-1935 ?Today's Date: 02/11/2022 ? ? ?History of Present Illness Patient is an 87 year old female with a PMH (+) for  hypertension, hyperlipidemia, CAD, CHF, and hypothyroidism who presented to the ED with stroke like symptoms. ? ?  ?PT Comments  ? ? Physical Therapy Treatment  completed this date. Patient tolerated session well and was agreeable to treatment. Upon entry into room patient was finishing session with OT. No pain reported throughout session. Patient continues to demonstrate ease with all bed mobility at supervision. Upon sitting no reports of dizziness were were reported. Patient was able to tolerate an increase distance when [facility]ulating today (~364f) [facility] also demonstrated increase ease and safety when ascending/descending 4 steps with initially bilateral hand rails but progressing to L hand rail support only. No LOB noted. Patient was left in bed with all needs met and in reach. Patient would continue to benefit from skilled physical therapy in order to optimize patient return to PLOF. Continue to recommend HHPT upon discharge from acute hospitalization.  ?  ?Recommendations for follow up therapy are one component of a multi-disciplinary discharge planning process, led by the attending physician.  Recommendations may be updated based on patient status, additional functional criteria and insurance authorization. ? ?Follow Up Recommendations ? Home health PT ?  ?  ?Assistance Recommended at Discharge Frequent or constant Supervision/Assistance  ?Patient can return home with the following A little help with walking and/or transfers;Help with stairs or ramp for entrance ?  ?Equipment Recommendations ? Rolling walker (2 wheels)  ?  ?Recommendations for Other Services   ? ? ?  ?Precautions / Restrictions Precautions ?Precautions: Fall ?Restrictions ?Weight Bearing Restrictions: No  ?   ? ?Mobility ? Bed Mobility ?Overal bed mobility: Needs Assistance ?Bed Mobility: Sidelying to Sit, Sit to Supine ?  ?Sidelying to sit: Supervision ?Supine to sit: Supervision ?Sit to supine: Supervision ?  ?General bed mobility comments: HOB was only mildly elevated when performing all bed mobility ?Patient Response: Cooperative ? ?Transfers ?Overall transfer level: Needs assistance ?Equipment used: Rolling walker (2 wheels) ?Transfers: Sit to/from Stand ?Sit to Stand: Supervision ?  ?  ?  ?  ?  ?  ?  ? ?Ambulation/Gait ?Ambulation/Gait assistance: Supervision ?Gait Distance (Feet): 350 Feet ?Assistive device: Rolling walker (2 wheels) ?Gait Pattern/deviations: Step-through pattern, Decreased step length - right, Decreased step length - left, Decreased stride length, Narrow base of support, Trunk flexed ?Gait velocity: decreased ?  ?  ?General Gait Details: no LOB noted ? ? ?Stairs ?Stairs: Yes ?Stairs assistance: Supervision ?Stair Management: Two rails, One rail Left, Alternating pattern ?Number of Stairs: 4 ?General stair comments: patient ascended/descended 4 steps with safety and ease ? ? ?Wheelchair Mobility ?  ? ?Modified Rankin (Stroke Patients Only) ?  ? ? ?  ?Balance Overall balance assessment: Needs assistance ?Sitting-balance support: Feet supported, Single extremity supported, No upper extremity supported ?Sitting balance-Leahy Scale: Good ?  ?  ?Standing balance support: Bilateral upper extremity supported, During functional activity, Reliant on assistive device for balance, Single extremity supported ?Standing balance-Leahy Scale: Good ?Standing balance comment: w/ RW ?  ?  ?  ?  ?  ?  ?  ?  ?  ?  ?  ?  ? ?  ?Cognition Arousal/Alertness: Awake/alert ?Behavior During Therapy: [facility]/performed ?Overall Cognitive Status: Within Functional Limits for tasks assessed ?  ?  ?  ?  ?  ?  ?  ?  ?  ?  ?  ?  ?  ?  ?  ?  ?  General Comments: Pleasant to work with ?  ?  ? ?  ?Exercises   ? ?   ?General Comments General comments (skin integrity, edema, etc.): SpO2 remained >90% on room air, HR ranged from 85-97bpm ?  ?  ? ?Pertinent Vitals/Pain Pain Assessment ?Pain Assessment: No/denies pain ?Pain Intervention(s): Monitored during session  ? ? ?Home Living   ?  ?  ?  ?  ?  ?  ?  ?  ?  ?   ?  ?Prior Function    ?  ?  ?   ? ?PT Goals (current goals can now be found in the care plan section) Acute Rehab PT Goals ?PT Goal Formulation: Patient unable to participate in goal setting ?Time For Goal Achievement: 02/23/22 ?Potential to Achieve Goals: Fair ?Progress towards PT goals: Progressing toward goals ? ?  ?Frequency ? ? ? 7X/week ? ? ? ?  ?PT Plan Current plan remains appropriate  ? ? ?Co-evaluation   ?  ?  ?  ?  ? ?  ?AM-PAC PT "6 Clicks" Mobility   ?Outcome Measure ? Help needed turning from your back to your side while in a flat bed without using bedrails?: A Little ?Help needed moving from lying on your back to sitting on the side of a flat bed without using bedrails?: A Little ?Help needed moving to and from a bed to a chair (including a wheelchair)?: A Little ?Help needed standing up from a chair using your arms (e.g., wheelchair or bedside chair)?: A Little ?Help needed to walk in hospital room?: A Little ?Help needed climbing 3-5 steps with a railing? : A Little ?6 Click Score: 18 ? ?  ?End of Session Equipment Utilized During Treatment: Gait belt ?Activity Tolerance: Patient tolerated treatment well ?Patient left: in bed;with call bell/phone within reach;with bed alarm set;with family/visitor present ?Nurse Communication: Mobility status ?PT Visit Diagnosis: Unsteadiness on feet (R26.81);Muscle weakness (generalized) (M62.81);Difficulty in walking, not elsewhere classified (R26.2) ?  ? ? ?Time: 2248-2500 ?PT Time Calculation (min) (ACUTE ONLY): 13 min ? ?Charges:  $Therapeutic Activity: 8-22 mins          ?          ? ?Iva Boop, PT  ?02/11/22. 10:46 AM ? ? ?

## 2022-02-11 NOTE — TOC Transition Note (Signed)
Transition of Care (TOC) - CM/SW Discharge Note ? ? ?Patient Details  ?Name: Andrea SmallDelores C Phillips ?MRN: [id number] ?Date of Birth: August 10, 1935 ? ?Transition of Care (TOC) CM/SW Contact:  ?Margarito LinerSarah C Doc Mandala, LCSW ?Phone Number: ?02/11/2022, 2:05 PM ? ? ?Clinical Narrative: Patient has orders to discharge home today. [facility] Ordered 3-in-1 through [employer]. No further concerns. CSW signing off.   ? ?Final next level of care: Home w Home Health Services ?Barriers to Discharge: Barriers Resolved ? ? ?Patient Goals and CMS Choice ?Patient states their goals for this hospitalization and ongoing recovery are:: to get stronger ?  ?Choice offered to / list presented to : Patient, Sibling ? ?Discharge Placement ?  ?           ?  ?Patient to be transferred to facility by: Sister ?Name of family member notified: Zella Ballharlotte Clark ?Patient and family notified of of transfer: 02/11/22 ? ?Discharge Plan and Services ?In-house Referral: NA ?  ?Post Acute Care Choice: Skilled Nursing Facility          ?DME Arranged: 3-N-1 ?DME Agency: [employer] ?Date DME Agency Contacted: 02/11/22 ?  ?  ?HH Arranged: RN, PT, OT, Speech Therapy ?HH Agency: [facility] ?Date HH Agency Contacted: 02/11/22 ?  ?[facility]fonzo BeersKelsey Wilson ? ?Social Determinants of Health (SDOH) Interventions ?  ? ? ?Readmission Risk Interventions ?   ? View : No data to display.  ?  ?  ?  ? ? ? ? ? ?

## 2022-02-11 NOTE — Discharge Summary (Signed)
?Physician Discharge Summary ?  ?Patient: Andrea GOODGAME MRN: [id number] DOB: October 19, 1935  ?Admit date:     02/08/2022  ?Discharge date: 02/11/22  ?Discharge Physician: Lorella Nimrod  ? ?PCP: Perrin Maltese, MD  ? ?Recommendations at discharge:  ?Please obtain CBC and BMP in 1 week ?Follow-up with cardiology within 1 week ?Follow-up with primary care provider within a week ?Follow-up with neurology in 2 to 4 weeks ? ?Discharge Diagnoses: ?Principal Problem: ?  Acute CVA (cerebrovascular accident) ([place]) ?Active Problems: ?  Atrial fibrillation and flutter ([place]) ?  Chronic HFrEF (heart failure with reduced ejection fraction)  with improved EF(HCC) ?  AKI (acute kidney injury) ([place]) ?  Coronary artery disease involving native coronary artery of native heart without angina pectoris ?  Essential hypertension ?  OSA (obstructive sleep apnea) ?  Hypothyroidism ? ? ?Hospital Course: ?Taken from prior notes. ? ?Natausha BELLAGRACE Phillips is a 87 y.o. female with medical history significant for HFrEF with improved EF, HLD, HTN OSA, hypothyroidism, CAD, brought in as code stroke after patient was noted to stumble and have slurred speech and right-sided weakness after having dinner.  Patient arrives within the tPA window NIHSS was 7 on arrival.  Initial head CT was negative.  The ED provider as well as neurologist on call discussed TNK with family members however the family opted against it and the decision was made to obtain CTA and if unremarkable to continue with stroke work-up.  CTA and CT cerebral perfusion showed no LVO or high-grade stenosis but did show a left PCA territory core infarct with area of ischemic penumbra.  By the time of the results, patient's NIHSS had improved to 3 but by which time patient no longer met criteria for TNK due to being outside the window and with improving symptoms.   ?She was treated with aspirin and hospitalist consulted for admission. ?Additional ED data reviewed: Lab work significant for  creatinine of 1.15, up from 0.82 with troponin of 22 with other blood work unremarkable, COVID and flu negative.  Chest x-ray clear. ? ?MRI ordered-still pending. ?Repeated CT head due to persistent headache which was resistant to pain medications and it shows evolving left PCA territory infarct.  No evidence of hemorrhagic conversion and no midline shift. ?A1c of 5.4 ?Lipid panel within normal limit with LDL of 94 ?Echocardiogram done-with reduced EF of 40 to 45% and regional wall motion abnormalities along with grade 2 diastolic dysfunction. ?PT/OT now change recommendations to home health services. ?Patient was also found to have new onset atrial fibrillation/flutter-cardiology was consulted for this new arrhythmia and new regional wall motion abnormalities.  Troponin barely positive with a downward trend, most likely secondary to demand ischemia with new stroke and new onset A-fib. ?CTA with no large vessel occlusion ? ?Cardiology started her on amiodarone and Eliquis.  Patient is allergic to aspirin and Plavix was discontinued after starting Eliquis.  Most likely has embolic stroke with new onset A-fib. ?CHA2DS2-VASc score of 8.  Currently rate controlled. ?No plan for inpatient ischemic work-up, patient will follow-up with her own cardiologist for further recommendations on discharge. ? ?Her home dose of metoprolol was later discontinued due to bradycardia and amiodarone dose was decreased to 200 mg daily.  She was converted back to sinus rhythm before discharge. ? ?Patient will continue current regimen and need to have a close follow-up with her cardiologist for further recommendations. ? ?Patient also need to have a follow-up with neurology in 2 to 4 weeks. ? ? ?  Assessment and Plan: ?* Acute CVA (cerebrovascular accident) ([place]) ?Patient with left PCA territory infarct with another acute infarct in left cerebellum. ?No focal neurologic deficit. ?Most likely embolic in the setting of new onset atrial  fibrillation/flutter. ?Patient is allergic to aspirin and initially was started on Plavix which was discontinued later by neurology as she will be on Eliquis now. ?-Continue with statin ?-PT/OT are now recommending home health services ?-Patient will follow-up with neurology as an outpatient in 2 to 4 weeks ? ? ?Atrial fibrillation and flutter ([place]) ?Most likely the cause of her stroke.  New onset atrial fibrillation/flutter. ?Cardiology was consulted.  CHA2DS2-VASc score of 8 ?-She was started on amiodarone ?-She was also started on metoprolol ?-She was started on Eliquis ? ?Chronic HFrEF (heart failure with reduced ejection fraction)  with improved EF(HCC) ?EF of 50 to 55% in January 2023 up from prior of 30 to 35%, which is again down to 40 to 45% on repeat echocardiogram.  New onset A-fib which might be contributory. ?Also found to have regional wall motion abnormalities with barely positive troponin and no chest pain.  Cardiology was consulted and no plan for inpatient ischemic work-up, which can be done as an outpatient by her own cardiologist. ?Currently euvolemic ?-Restarted on home Lasix and metoprolol was added. ? ?AKI (acute kidney injury) ([place]) ? Resolved. ?-Monitor renal function ?-Avoid nephrotoxins ? ?Coronary artery disease involving native coronary artery of native heart without angina pectoris ?No complaints of chest pain and EKG nonacute ?S/p cath January 2023 for severe single-vessel CAD ?Repeat echocardiogram with regional wall motion abnormalities and barely positive troponin. ?No need for ischemic work-up while inpatient per cardiology. ?-Continue statin ?-Outpatient cardiology follow-up ? ? ?Hypothyroidism ?-Continue levothyroxine ? ?OSA (obstructive sleep apnea) ?Continue CPAP ? ?Essential hypertension ?BP controlled . ?Patient completed the duration of permissive hypertension. ?Restarted on home Lasix and metoprolol. ?Keep holding telmisartan-we can resume blood pressure trending  up ? ? ?Consultants: Neurology, cardiology ?Procedures performed: None ?Disposition: Home health ?Diet recommendation:  ?Discharge Diet Orders (From admission, onward)  ? ?  Start     Ordered  ? 02/11/22 0000  Diet - low sodium heart healthy       ? 02/11/22 1350  ? ?  ?  ? ?  ? ?Cardiac diet ?DISCHARGE MEDICATION: ?Allergies as of 02/11/2022   ? ?   Reactions  ? Aspirin Hives  ? Other Hives  ? Yellow dye 6 ?FOOD COLOR YELLOW POWDER  ? Atorvastatin Other (See Comments)  ? unknown  ? Codeine Other (See Comments)  ? Doesn't know  ? Conj Estrog-medroxyprogest Ace Other (See Comments)  ? PREMPRO 0.3-1.5 MG ORAL TABLET ?unknown  ? Prednisone Other (See Comments)  ? Chest pain  ? Raloxifene Other (See Comments)  ? EVISTA 60 MG ORAL TABLET ?unknown  ? Ace Inhibitors Cough  ? Amlodipine Itching  ? Valsartan Itching  ? ?  ? ?  ?Medication List  ?  ? ?STOP taking these medications   ? ?isosorbide mononitrate 30 MG 24 hr tablet ?Commonly known as: IMDUR ?  ?metoprolol succinate 25 MG 24 hr tablet ?Commonly known as: TOPROL-XL ?  ? ?  ? ?TAKE these medications   ? ?albuterol 108 (90 Base) MCG/ACT inhaler ?Commonly known as: VENTOLIN HFA ?Inhale 2 puffs into the lungs every 6 (six) hours as needed for wheezing or shortness of breath. ?  ?amiodarone 200 MG tablet ?Commonly known as: PACERONE ?Take 1 tablet (200 mg total) by mouth daily. ?  Start taking on: February 12, 2022 ?  ?apixaban 5 MG Tabs tablet ?Commonly known as: ELIQUIS ?Take 1 tablet (5 mg total) by mouth 2 (two) times daily. ?  ?cetirizine 10 MG tablet ?Commonly known as: ZYRTEC ?Take 10 mg by mouth daily. ?  ?Dulera 200-5 MCG/ACT Aero ?Generic drug: mometasone-formoterol ?Inhale 2 puffs into the lungs 2 (two) times daily. ?  ?fluticasone 50 MCG/ACT nasal spray ?Commonly known as: FLONASE ?Place 1 spray into both nostrils 2 (two) times daily as needed for allergies or rhinitis. ?  ?furosemide 20 MG tablet ?Commonly known as: LASIX ?Take 1 tablet (20 mg total) by mouth  daily. ?  ?icosapent Ethyl 1 g capsule ?Commonly known as: VASCEPA ?Take 2 g by mouth 2 (two) times daily. ?  ?levothyroxine 112 MCG tablet ?Commonly known as: SYNTHROID ?Take 112 mcg by mouth daily. ?  ?magnesium oxide 400 MG

## 2022-02-11 NOTE — Progress Notes (Addendum)
Occupational Therapy Treatment ?Patient Details ?Name: Andrea Phillips ?MRN: [id number] ?DOB: 15-Sep-1935 ?Today's Date: 02/11/2022 ? ? ?History of present illness Patient is an 87 year old female with a PMH (+) for  hypertension, hyperlipidemia, CAD, CHF, and hypothyroidism who presented to the ED with stroke like symptoms. ?  ?OT comments ? Pt seen for OT tx this date. Pt in bed, family member present and pt agreeable. Pt completed bed mobility with VC for log roll technique to improve independence after demonstrating difficulty with supine >sit. Pt stood, ambulated to the bathroom and completed transfer to std height toilet with supervision using RW. Pt set up for wipes and completed pericare using lateral lean with UE support on grab bar without LOB and no need for direct assist. Pt stood at sink to complete grooming with supervision before returning to bed. Pt eager to return home. Continues to benefit from skilled OT services. (Addendum to update date for goals).   ? ?Recommendations for follow up therapy are one component of a multi-disciplinary discharge planning process, led by the attending physician.  Recommendations may be updated based on patient status, additional functional criteria and insurance authorization. ?   ?Follow Up Recommendations ? Home health OT  ?  ?Assistance Recommended at Discharge Frequent or constant Supervision/Assistance  ?Patient can return home with the following ? A little help with walking and/or transfers;A little help with bathing/dressing/bathroom;Assistance with cooking/housework;Assist for transportation;Direct supervision/assist for medications management;Direct supervision/assist for financial management;Help with stairs or ramp for entrance ?  ?Equipment Recommendations ? BSC/3in1;Tub/shower bench  ?  ?Recommendations for Other Services   ? ?  ?Precautions / Restrictions Precautions ?Precautions: Fall ?Restrictions ?Weight Bearing Restrictions: No  ? ? ?  ? ?Mobility  Bed Mobility ?Overal bed mobility: Needs Assistance ?Bed Mobility: Rolling, Sidelying to Sit, Sit to Supine ?Rolling: Supervision ?Sidelying to sit: Supervision ?  ?Sit to supine: Supervision ?  ?General bed mobility comments: VC to use log roll technqiue as pt demo'd difficulty with sup>sit ?  ? ?Transfers ?Overall transfer level: Needs assistance ?Equipment used: Rolling walker (2 wheels) ?Transfers: Sit to/from Stand ?Sit to Stand: Supervision ?  ?  ?  ?  ?  ?  ?  ?  ?Balance Overall balance assessment: Needs assistance ?Sitting-balance support: Feet supported, Single extremity supported, No upper extremity supported ?Sitting balance-Leahy Scale: Good ?Sitting balance - Comments: mild dizziness upon coming to sitting ?  ?Standing balance support: Bilateral upper extremity supported, During functional activity, Reliant on assistive device for balance, Single extremity supported ?Standing balance-Leahy Scale: Fair ?Standing balance comment: reliant on RW for mobility, able to stand at sink with UE support to wash her L hand, no LOB noted ?  ?  ?  ?  ?  ?  ?  ?  ?  ?  ?  ?   ? ?ADL either performed or assessed with clinical judgement  ? ?ADL Overall ADL's : Needs assistance/impaired ?  ?  ?Grooming: Standing;Supervision/safety;Wash/dry hands ?  ?Upper Body Bathing: Sitting;Set up;Supervision/ safety ?  ?Lower Body Bathing: Sitting/lateral leans;Set up;Supervison/ safety ?  ?  ?  ?  ?  ?Toilet Transfer: Regular Toilet;Supervision/safety;Rolling walker (2 wheels) ?  ?Toileting- Clothing Manipulation and Hygiene: Sitting/lateral lean;Supervision/safety;Set up ?  ?  ?  ?Functional mobility during ADLs: Supervision/safety;Rolling walker (2 wheels) ?  ?  ? ?Extremity/Trunk Assessment   ?  ?  ?  ?  ?  ? ?Vision   ?  ?  ?Perception   ?  ?  Praxis   ?  ? ?Cognition Arousal/Alertness: Awake/alert ?Behavior During Therapy: [facility] for tasks assessed/performed ?Overall Cognitive Status: Within Functional Limits for tasks assessed ?   ?  ?  ?  ?  ?  ?  ?  ?  ?  ?  ?  ?  ?  ?  ?  ?  ?  ?  ?   ?Exercises   ? ?  ?Shoulder Instructions   ? ? ?  ?General Comments    ? ? ?Pertinent Vitals/ Pain       Pain Assessment ?Pain Assessment: No/denies pain ? ?Home Living   ?  ?  ?  ?  ?  ?  ?  ?  ?  ?  ?  ?  ?  ?  ?  ?  ?  ?  ? ?  ?Prior Functioning/Environment    ?  ?  ?  ?   ? ?Frequency ? Min 2X/week  ? ? ? ? ?  ?Progress Toward Goals ? ?OT Goals(current goals can now be found in the care plan section) ? Progress towards OT goals: Progressing toward goals ? ?Acute Rehab OT Goals ?Patient Stated Goal: go home ?OT Goal Formulation: With patient/family ?Time For Goal Achievement: 02/24/22 ?Potential to Achieve Goals: Good  ?Plan Discharge plan remains appropriate;Frequency remains appropriate   ? ?Co-evaluation ? ? ?   ?  ?  ?  ?  ? ?  ?AM-PAC OT "6 Clicks" Daily Activity     ?Outcome Measure ? ? Help from another person eating meals?: None ?Help from another person taking care of personal grooming?: None ?Help from another person toileting, which includes using toliet, bedpan, or urinal?: A Little ?Help from another person bathing (including washing, rinsing, drying)?: A Little ?Help from another person to put on and taking off regular upper body clothing?: None ?Help from another person to put on and taking off regular lower body clothing?: A Little ?6 Click Score: 21 ? ?  ?End of Session Equipment Utilized During Treatment: Gait belt;Rolling walker (2 wheels) ? ?OT Visit Diagnosis: Other abnormalities of gait and mobility (R26.89);History of falling (Z91.81);Muscle weakness (generalized) (M62.81) ?  ?Activity Tolerance Patient tolerated treatment well ?  ?Patient Left in bed;with call bell/phone within reach;with bed alarm set;with family/visitor present;Other (comment) (PT in) ?  ?Nurse Communication   ?  ? ?   ? ?Time: [id number] ?OT Time Calculation (min): 14 min ? ?Charges: OT General Charges ?$OT Visit: 1 Visit ?OT Treatments ?$Self Care/Home  Management : 8-22 mins ? ?Ardeth Perfect., MPH, MS, OTR/L ?ascom [phone] ?02/11/22, 9:34 AM ? ?

## 2022-02-11 NOTE — Progress Notes (Signed)
Received MD order to discharge patient to home.  Reviewed discharge instructions, prescriptions, home meds and follow up appointments with patient and patient verbalized understanding.   ?

## 2022-02-11 NOTE — Progress Notes (Signed)
[facility] Cardiology ? ? ? ?SUBJECTIVE: Andrea Phillips is an 87 year old female with PMH significant for CAD with occluded distal OM3 (too small for PCI), HFrEF, frequent PVCs, hypertension, hyperlipidemia, thyroid disease and OSA who was admitted to [facility] on 02/08/2022 as a code stroke and was found to have a left PCA territory infarct with an area of ischemic penumbra and acute cerebellar infarct confirmed by MRI on 02/10/2022.  Cardiology consulted for HFrEF and atrial fibrillation management. ? ? ?The patient is feeling well on today and does not have any complaints at this time. She denies having any chest pain, palpitations or dizziness. She reports that she is able to ambulate within the room without any issues. The patient is resting comfortably in bed and reports that she is ready to be discharged home. Her family is a the bedside.  ? ? ?Vitals:  ? 02/11/22 0554 02/11/22 0812 02/11/22 0932 02/11/22 1304  ?BP: 134/63 139/65 (!) 151/62 132/63  ?Pulse: 62 63 (!) 56 63  ?Resp: 18 18  16   ?Temp: 98 ?F (36.7 ?C) 97.9 ?F (36.6 ?C)  98.5 ?F (36.9 ?C)  ?TempSrc: Oral   Oral  ?SpO2: 94% 96%  97%  ?Weight:      ?Height:      ? ? ?No intake or output data in the 24 hours ending 02/11/22 1347 ? ? ? ?PHYSICAL EXAM ? ?General: Well developed, well nourished, in no acute distress ?HEENT:  Normocephalic and atraumatic. PERRL ?Neck:  No JVD.  ?Lungs: Clear bilaterally to auscultation. Chest expansion symmetrical. No wheezes, rails or rhonchi.  ?Heart: HRRR . Normal S1 and S2 without gallops or murmurs.  ?Abdomen: Bowel sounds are positive, abdomen soft and non-tender  ?Msk: Normal strength and tone for age. ?Extremities: No clubbing, cyanosis or edema.   ?Neuro: Alert and oriented X 3. ?Psych:  Good affect, responds appropriately ? ? ?LABS: ?Basic Metabolic Panel: ?Recent Labs  ?  02/08/22 ?2118 02/10/22 ?0415  ?NA 134* 137  ?K 4.4 4.4  ?CL 100 105  ?CO2 26 28  ?GLUCOSE 95 95  ?BUN 27* 16  ?CREATININE 1.15* 0.96  ?CALCIUM 9.8 9.6  ?MG  2.2  --   ? ?Liver Function Tests: ?Recent Labs  ?  02/08/22 ?2118  ?AST 17  ?ALT 15  ?ALKPHOS 60  ?BILITOT 0.8  ?PROT 6.4*  ?ALBUMIN 3.6  ? ?No results for input(s): LIPASE, AMYLASE in the last 72 hours. ?CBC: ?Recent Labs  ?  02/08/22 ?2118 02/11/22 ?0425  ?WBC 9.1 8.4  ?NEUTROABS 5.0  --   ?HGB 13.8 13.4  ?HCT 41.7 40.0  ?MCV 92.5 92.4  ?PLT 280 254  ? ?Cardiac Enzymes: ?No results for input(s): CKTOTAL, CKMB, CKMBINDEX, TROPONINI in the last 72 hours. ?BNP: ?Invalid input(s): POCBNP ?D-Dimer: ?No results for input(s): DDIMER in the last 72 hours. ?Hemoglobin A1C: ?Recent Labs  ?  02/09/22 ?0202  ?HGBA1C 5.4  ? ?Fasting Lipid Panel: ?Recent Labs  ?  02/09/22 ?0202  ?CHOL 171  ?HDL 53  ?LDLCALC 94  ?TRIG 121  ?CHOLHDL 3.2  ? ?Thyroid Function Tests: ?Recent Labs  ?  02/08/22 ?2118  ?TSH 1.281  ? ?Anemia Panel: ?No results for input(s): VITAMINB12, FOLATE, FERRITIN, TIBC, IRON, RETICCTPCT in the last 72 hours. ? ?CT HEAD WO CONTRAST ( ) ? ?Result Date: 02/09/2022 ?CLINICAL DATA:  Headache, chronic, new features or increased frequency Patient with stroke, now with persistent headache which is resistant to pain medications EXAM: CT HEAD WITHOUT CONTRAST TECHNIQUE: Contiguous axial images  were obtained from the base of the skull through the vertex without intravenous contrast. RADIATION DOSE REDUCTION: This exam was performed according to the departmental dose-optimization program which includes automated exposure control, adjustment of the mA and/or kV according to patient size and/or use of iterative reconstruction technique. COMPARISON:  CT 02/08/2022 FINDINGS: Brain: Evolving left PCA territory infarct.No evidence of hemorrhagic conversion.No midline shift. Patent basal cisterns. The ventricles are normal in size. Vascular: No hyperdense vessel.  Vascular calcifications. Skull: Negative for skull fracture. Sinuses/Orbits: No acute finding. Other: None. IMPRESSION: Evolving left PCA territory infarct. No evidence  of hemorrhagic conversion. No midline shift. Electronically Signed   By: Maurine Simmering M.D.   On: 02/09/2022 15:46  ? ?MR BRAIN WO CONTRAST ? ?Result Date: 02/09/2022 ?CLINICAL DATA:  Slurred speech, stumbling, stroke suspected EXAM: MRI HEAD WITHOUT CONTRAST TECHNIQUE: Multiplanar, multiecho pulse sequences of the brain and surrounding structures were obtained without intravenous contrast. COMPARISON:  No prior MRI, correlation is made with CT head 02/08/2022 FINDINGS: Brain: Restricted diffusion with ADC correlate in the left occipital lobe, inferomedial temporal lobe, left posterior and mid hippocampus, and left thalamus (series 5, images 16-36), consistent with known left PCA territory infarct. Additional focus of restricted diffusion with ADC correlate in the left cerebellum (series 5, images 13-16). No acute hemorrhage, mass, mass effect, or midline shift. Scattered T2 hyperintense signal in the periventricular Rosselyn Martha matter, likely the sequela of mild chronic small vessel ischemic disease. No hydrocephalus or extra-axial collection. Cerebral volume is within normal limits for age. Vascular: Normal flow voids. Skull and upper cervical spine: Normal marrow signal. Degenerative changes at C3-C4 with trace retrolisthesis. Sinuses/Orbits: Status post bilateral lens replacements. The paranasal sinuses are clear. Other: The mastoids are well aerated. IMPRESSION: 1. Restricted diffusion in the left occipital lobe, inferomedial temporal lobe, left posterior and mid hippocampus, and left thalamus, consistent with known left PCA territory infarct. 2. Additional acute infarct in the left cerebellum. Electronically Signed   By: Merilyn Baba M.D.   On: 02/09/2022 17:59   ? ? ?Echo: (02/09/2022) ?IMPRESSIONS  ? 1. Mild inferior hypo.  ? 2. Left ventricular ejection fraction, by estimation, is 40 to 45%. The  ?left ventricle has mildly decreased function. The left ventricle  ?demonstrates regional wall motion abnormalities (see  scoring  ?diagram/findings for description). Left ventricular  ?diastolic parameters are consistent with Grade II diastolic dysfunction  ?(pseudonormalization).  ? 3. Right ventricular systolic function is normal. The right ventricular  ?size is normal.  ? 4. The mitral valve is normal in structure. No evidence of mitral valve  ?regurgitation.  ? 5. The aortic valve is normal in structure. Aortic valve regurgitation is  ?not visualized. Aortic valve sclerosis is present, with no evidence of  ?aortic valve stenosis.  ? ?TELEMETRY: SR with HR of 63 bpm.  ? ?ASSESSMENT AND PLAN: ? ?Principal Problem: ?  Acute CVA (cerebrovascular accident) ([place]) ?Active Problems: ?  Essential hypertension ?  OSA (obstructive sleep apnea) ?  Chronic HFrEF (heart failure with reduced ejection fraction)  with improved EF(HCC) ?  Hypothyroidism ?  AKI (acute kidney injury) ([place]) ?  Coronary artery disease involving native coronary artery of native heart without angina pectoris ?  Atrial fibrillation and flutter ([place]) ?  ? ? ? ?#Acute CVA with left PCA infarct and acute cerebellar infarct confirmed by MRI likely secondary to new onset atrial fibrillation and flutter.  ?Now stable. Neurologic Symptoms have resolved. No dizziness.  ? - agree with current management  for CVA. ? - Neurology consult appreciated.  ? - a-fib/flutter management as below.  ? ?#New onset atrial fibrillation and flutter ?Stable. ECG on 02/10/2022 revealed atrial flutter with controlled ventricular response at 80 bpm. Patient was placed on amiodarone loading dose with successful conversion to NSR. ECG on today reveals  NSR with a HR of 61 bpm and no evidence of acute ischemic changes. Bedside telemetry reveals SR but the patient has had episodes of bradycardia. The patient has a CHA2DS2-VASc score of 8. ? -start amiodarone 200 mg once daily now.  ? -discontinue metoprolol due to bradycardia.  ? -Recommend starting Eliquis 5 mg twice daily once cleared by Neurology.  ? -  cleared for discharge from cardiac standpoint. Follow up with primary cardiology Dr. Nehemiah Massed in 1 week post discharge. Consider outpatient holter in office.  ? - continuous cardiac monitoring until

## 2022-02-21 ENCOUNTER — Emergency Department: Payer: Medicare Other

## 2022-02-21 ENCOUNTER — Emergency Department
Admission: EM | Admit: 2022-02-21 | Discharge: 2022-02-21 | Disposition: A | Discharge status: Home / Self Care | Payer: Medicare Other | Attending: Emergency Medicine | Admitting: Emergency Medicine

## 2022-02-21 ENCOUNTER — Encounter: Payer: Self-pay | Admitting: Intensive Care

## 2022-02-21 ENCOUNTER — Other Ambulatory Visit: Payer: Self-pay

## 2022-02-21 DIAGNOSIS — I509 Heart failure, unspecified: Secondary | ICD-10-CM | POA: Diagnosis not present

## 2022-02-21 DIAGNOSIS — R0789 Other chest pain: Secondary | ICD-10-CM | POA: Diagnosis not present

## 2022-02-21 DIAGNOSIS — R112 Nausea with vomiting, unspecified: Secondary | ICD-10-CM | POA: Insufficient documentation

## 2022-02-21 LAB — COMPREHENSIVE METABOLIC PANEL
ALT: 30 U/L (ref 0–44)
AST: 58 U/L — ABNORMAL HIGH (ref 15–41)
Albumin: 3.6 g/dL (ref 3.5–5.0)
Alkaline Phosphatase: 46 U/L (ref 38–126)
Anion gap: 8 (ref 5–15)
BUN: 24 mg/dL — ABNORMAL HIGH (ref 8–23)
CO2: 25 mmol/L (ref 22–32)
Calcium: 10.1 mg/dL (ref 8.9–10.3)
Chloride: 106 mmol/L (ref 98–111)
Creatinine, Ser: 1.01 mg/dL — ABNORMAL HIGH (ref 0.44–1.00)
GFR, Estimated: 55 mL/min — ABNORMAL LOW (ref >60)
Glucose, Bld: 128 mg/dL — ABNORMAL HIGH (ref 70–99)
Potassium: 4.2 mmol/L (ref 3.5–5.1)
Sodium: 139 mmol/L (ref 135–145)
Total Bilirubin: 0.8 mg/dL (ref 0.3–1.2)
Total Protein: 7.1 g/dL (ref 6.5–8.1)

## 2022-02-21 LAB — TROPONIN I (HIGH SENSITIVITY)
Troponin I (High Sensitivity): 18 ng/L — ABNORMAL HIGH (ref <18)
Troponin I (High Sensitivity): 16 ng/L (ref <18)

## 2022-02-21 LAB — CBC
HCT: 46.3 % — ABNORMAL HIGH (ref 36.0–46.0)
Hemoglobin: 15.4 g/dL — ABNORMAL HIGH (ref 12.0–15.0)
MCH: 30 pg (ref 26.0–34.0)
MCHC: 33.3 g/dL (ref 30.0–36.0)
MCV: 90.1 fL (ref 80.0–100.0)
Platelets: 444 10*3/uL — ABNORMAL HIGH (ref 150–400)
RBC: 5.14 MIL/uL — ABNORMAL HIGH (ref 3.87–5.11)
RDW: 12.4 % (ref 11.5–15.5)
WBC: 12.4 10*3/uL — ABNORMAL HIGH (ref 4.0–10.5)
nRBC: 0 % (ref 0.0–0.2)

## 2022-02-21 MED ORDER — ACETAMINOPHEN 325 MG PO TABS
650.0000 mg | ORAL_TABLET | Freq: Once | ORAL | Status: AC
  Administered 2022-02-21: 650 mg via ORAL
  Filled 2022-02-21: qty 2

## 2022-02-21 MED ORDER — ONDANSETRON 4 MG PO TBDP
4.0000 mg | ORAL_TABLET | Freq: Three times a day (TID) | ORAL | 0 refills | Status: DC | PRN
Start: 2022-02-21 — End: 2022-07-27

## 2022-02-21 NOTE — ED Triage Notes (Signed)
Patient arrived by EMS from home for midline chest discomfort. Upon arrival reports chest discomfort has subsided. Patient has not received any medications. Hx MI. Takes eliquis daily. EMS vitals 177/94 b/p, 95% RA. ?

## 2022-02-21 NOTE — ED Provider Notes (Signed)
? ?[facility] ?Provider Note ? ? ? Event Date/Time  ? First MD Initiated Contact with Patient 02/21/22 [phone]   ?  (approximate) ? ? ?History  ? ?Chest Pain ? ? ?HPI ? ?Andrea Phillips is a 87 y.o. female with a history of CHF, atrial fibrillation, AKI, CVA who presents with complaints of chest pain.  Patient reports she ate some toast and orange juice this morning and felt nauseated and vomited.  Shortly thereafter she developed discomfort in her chest.  She reports symptoms resolved prior to arrival to the emergency department.  She denies abdominal pain.  No diarrhea she does take Eliquis for atrial fibrillation.  Denies fevers chills or cough.  No shortness of breath. ?  ? ? ?Physical Exam  ? ?Triage Vital Signs: ?ED Triage Vitals  ?Enc Vitals Group  ?   BP 02/21/22 0907 (!) 163/97  ?   Pulse Rate 02/21/22 0907 86  ?   Resp 02/21/22 0907 17  ?   Temp 02/21/22 0911 98.6 ?F (37 ?C)  ?   Temp Source 02/21/22 0911 Oral  ?   SpO2 02/21/22 0907 98 %  ?   Weight 02/21/22 0908 74.8 kg (165 lb)  ?   Height 02/21/22 0908 1.524 m (5')  ?   Head Circumference --   ?   Peak Flow --   ?   Pain Score 02/21/22 0907 2  ?   Pain Loc --   ?   Pain Edu? --   ?   Excl. in GC? --   ? ? ?Most recent vital signs: ?Vitals:  ? 02/21/22 1130 02/21/22 1200  ?BP: (!) 112/100 124/80  ?Pulse: 81 77  ?Resp: 18 10  ?Temp:    ?SpO2: 95% 94%  ? ? ? ?General: Awake, no distress.  ?CV:  Good peripheral perfusion.  Reassuring exam ?Resp:  Normal effort.  ?Abd:  No distention.  ?Other:   ? ? ?ED Results / Procedures / Treatments  ? ?Labs ?(all labs ordered are listed, but only abnormal results are displayed) ?Labs Reviewed  ?CBC - Abnormal; Notable for the following components:  ?    Result Value  ? WBC 12.4 (*)   ? RBC 5.14 (*)   ? Hemoglobin 15.4 (*)   ? HCT 46.3 (*)   ? Platelets 444 (*)   ? All other components within normal limits  ?COMPREHENSIVE METABOLIC PANEL - Abnormal; Notable for the following components:  ?  Glucose, Bld 128 (*)   ? BUN 24 (*)   ? Creatinine, Ser 1.01 (*)   ? AST 58 (*)   ? GFR, Estimated 55 (*)   ? All other components within normal limits  ?TROPONIN I (HIGH SENSITIVITY) - Abnormal; Notable for the following components:  ? Troponin I (High Sensitivity) 18 (*)   ? All other components within normal limits  ?TROPONIN I (HIGH SENSITIVITY)  ? ? ? ?EKG ? ?ED ECG REPORT ?I, Jene Every, the attending physician, personally viewed and interpreted this ECG. ? ?Date: 02/21/2022 ? ?Rhythm: Atrial fibrillation ?QRS Axis: normal ?Intervals: normal ?ST/T Wave abnormalities: normal ?Narrative Interpretation: no evidence of acute ischemia ? ? ? ?RADIOLOGY ?Chest x-ray viewed by me, no acute abnormality ? ? ? ?PROCEDURES: ? ?Critical Care performed:  ? ?.1-3 Lead EKG Interpretation ?Performed by: Jene Every, MD ?Authorized by: Jene Every, MD  ? ?  Interpretation: normal   ?  ECG rate assessment: normal   ?  Rhythm: atrial  fibrillation   ?  Ectopy: none   ?  Conduction: normal   ? ? ?MEDICATIONS ORDERED IN ED: ?Medications  ?acetaminophen (TYLENOL) tablet 650 mg (650 mg Oral Given 02/21/22 1159)  ? ? ? ?IMPRESSION / MDM / ASSESSMENT AND PLAN / ED COURSE  ?I reviewed the triage vital signs and the nursing notes. ? ? ? ?Patient presents with chest discomfort after vomiting as described above.  Differential includes ACS although EKG is reassuring, pending high-sensitivity troponin.  Differential includes chest discomfort from vomiting.  Patient does have a history of A-fib, rate seems controlled at this time, she denies palpitations.  Patient is asymptomatic at this time ? ?We will obtain labs including high-sensitivity troponin, chest x-ray, placed in the cardiac monitor and continue to observe ? ?Patient's lab work is overall reassuring, high sensitive troponin x2 not significantly changed.  Patient has had no further episodes of chest discomfort.  Her nausea has improved. ? ?Lab work reviewed and is reassuring.   Discussed with patient and her family, they feel comfortable going home and they can return anytime as needed. ? ?  ? ? ?FINAL CLINICAL IMPRESSION(S) / ED DIAGNOSES  ? ?Final diagnoses:  ?Atypical chest pain  ? ? ? ?Rx / DC Orders  ? ?ED Discharge Orders   ? ?      Ordered  ?  ondansetron (ZOFRAN-ODT) 4 MG disintegrating tablet  Every 8 hours PRN       ? 02/21/22 1249  ? ?  ?  ? ?  ? ? ? ?Note:  This document was prepared using Dragon voice recognition software and may include unintentional dictation errors. ?  ?Jene Every, MD ?02/21/22 1452 ? ?

## 2022-03-28 ENCOUNTER — Other Ambulatory Visit: Payer: Medicare Other | Admitting: Student

## 2022-03-28 DIAGNOSIS — M25561 Pain in right knee: Secondary | ICD-10-CM

## 2022-03-28 DIAGNOSIS — I5022 Chronic systolic (congestive) heart failure: Secondary | ICD-10-CM

## 2022-03-28 DIAGNOSIS — I4891 Unspecified atrial fibrillation: Secondary | ICD-10-CM

## 2022-03-28 DIAGNOSIS — I2510 Atherosclerotic heart disease of native coronary artery without angina pectoris: Secondary | ICD-10-CM

## 2022-03-28 DIAGNOSIS — Z515 Encounter for palliative care: Secondary | ICD-10-CM

## 2022-03-28 DIAGNOSIS — I10 Essential (primary) hypertension: Secondary | ICD-10-CM

## 2022-03-28 NOTE — Progress Notes (Signed)
[facility] Consult Note Telephone: [phone]  Fax: [phone]    Date of encounter: 03/28/22 1:05 PM PATIENT NAME: Andrea Phillips [address] [place] [id number]   [phone] (home)  DOB: 09-Jun-1935 MRN: [id number] PRIMARY CARE PROVIDER:    Perrin Maltese, MD,  [address] [place] [address] [phone]  REFERRING PROVIDER:   Perrin Maltese, MD [place],  [place] [address] [phone]  RESPONSIBLE PARTY:    Contact Information     Name Relation Home Work Mobile   Clark,Charlotte Sister [phone]  [phone]   Hobgood,Melissa Niece   [phone]   Normajean Glasgow Niece   [phone]   Ala Dach   [phone]   Flara, Storti   [phone]        I met face to face with patient and family in the home. Palliative Care was asked to follow this patient by consultation request of  Perrin Maltese, MD to address advance care planning and complex medical decision making. This is a follow up visit.                                   ASSESSMENT AND PLAN / RECOMMENDATIONS:   Advance Care Planning/Goals of Care: Goals include to maximize quality of life and symptom management. Patient/health care surrogate gave his/her permission to discuss. Our advance care planning conversation included a discussion about:    The value and importance of advance care planning  Experiences with loved ones who have been seriously ill or have died  Exploration of personal, cultural or spiritual beliefs that might influence medical decisions  Exploration of goals of care in the event of a sudden injury or illness  Identification of a healthcare agent  Review and updating or creation of an  advance directive document . Decision not to resuscitate or to de-escalate disease focused treatments due to poor prognosis. CODE STATUS: Full Code   Symptom Management/Plan:  Atrial  fibrillation and flutter-continue Eliquis 5 mg BID; she is to see cardiology tomorrow and will clarify if she should continue on amiodarone as she had a 30 day supply and has completed this.  CAD-continue statin; clarified dose as she was taking both 40 and 20 mg tablets. She should continue 40 mg daily.   Chronic Systolic Heart failure- endorses shortness of breath with exertion; no LE edema; usually gains weight around her abdomen. continue furosemide as directed, daily weights. She is to continue telmisartan for her hypertension; she is to take blood pressure readings with her tomorrow to cardiologist.   Bilateral knee pain-recommend acetaminophen 1000 mg BID. May apply heat pack for up to 20 minutes PRN.   Follow up Palliative Care Visit: Palliative care will continue to follow for complex medical decision making, advance care planning, and clarification of goals. Return in 8 weeks or prn.   This visit was coded based on medical decision making (MDM).  PPS: 60%  HOSPICE ELIGIBILITY/DIAGNOSIS: TBD  Chief Complaint: Palliative Medicine follow up visit.   HISTORY OF PRESENT ILLNESS:  [name] is a 87 y.o. year old female  with COPD, chronic systolic heart failure, syncope & collapse, hypertension, hyperlipidemia, sleep apnea, hypothyroidism. Hospitalization 4/18-4/21/23 due to  acute CVA, atrial fibrillation and flutter, heart failure, CAD. ED visit on 02/21/22 due to chest pain; EKG with atrial fibrillation.   Patient completed therapy last week. Feels she is  near her baseline. Uses walker or cane when ambulating outside. Endorses pain to bilateral knees; no falls. States she has had a couple of episodes of chest pain; states pain resolves on its own after a couple of minutes. Endorses shortness of breath with exertion. Weighs daily; stays within 3 pounds range. Sleeping well. B/p have been elevated varies from 811'W to 867 systolic. Has telmesartan ordered; states she is taking daily  but not with the rest of her medications. She took 30 day supply of her amiodarone; ran out about a week ago. She wants to clarify with cardiologist tomorrow if she should continue taking. A 10-point ROS is negative, except for the pertinent positives and negatives detailed per the HPI.  History obtained from review of EMR, discussion with primary team, and interview with family, facility staff/caregiver and/or Ms. Hegg.  I reviewed available labs, medications, imaging, studies and related documents from the EMR.  Records reviewed and summarized above.    Physical Exam: Current and past weights: 152 pounds  Pulse 72, resp 20, b/p 162/78, sats 95-97% on room air Constitutional: NAD General: frail appearing EYES: anicteric sclera, lids intact, no discharge  ENMT: intact hearing, oral mucous membranes moist, dentition intact CV: S1S2, RRR, no LE edema Pulmonary: LCTA, no increased work of breathing, no cough, room air Abdomen: normo-active BS + 4 quadrants, soft and non tender GU: deferred MSK: moves all extremities, ambulatory Skin: warm and dry, no rashes or wounds on visible skin Neuro:  no generalized weakness,  no cognitive impairment Psych: non-anxious affect, A and O x 3 Hem/lymph/immuno: no widespread bruising   Thank you for the opportunity to participate in the care of Ms. Desilva.  The palliative care team will continue to follow. Please call our office at [phone] if we can be of additional assistance.   Ezekiel Slocumb, NP   COVID-19 PATIENT SCREENING TOOL Asked and negative response unless otherwise noted:   Have you had symptoms of covid, tested positive or been in contact with someone with symptoms/positive test in the past 5-10 days? No

## 2022-04-01 ENCOUNTER — Encounter: Payer: Self-pay | Admitting: Emergency Medicine

## 2022-04-01 ENCOUNTER — Emergency Department: Payer: Medicare Other

## 2022-04-01 ENCOUNTER — Emergency Department
Admission: EM | Admit: 2022-04-01 | Discharge: 2022-04-01 | Disposition: A | Discharge status: Home / Self Care | Payer: Medicare Other | Attending: Emergency Medicine | Admitting: Emergency Medicine

## 2022-04-01 ENCOUNTER — Other Ambulatory Visit: Payer: Self-pay

## 2022-04-01 DIAGNOSIS — R0789 Other chest pain: Secondary | ICD-10-CM | POA: Insufficient documentation

## 2022-04-01 DIAGNOSIS — I10 Essential (primary) hypertension: Secondary | ICD-10-CM | POA: Diagnosis not present

## 2022-04-01 DIAGNOSIS — R079 Chest pain, unspecified: Secondary | ICD-10-CM

## 2022-04-01 LAB — CBC
HCT: 40.1 % (ref 36.0–46.0)
Hemoglobin: 13.4 g/dL (ref 12.0–15.0)
MCH: 30.1 pg (ref 26.0–34.0)
MCHC: 33.4 g/dL (ref 30.0–36.0)
MCV: 90.1 fL (ref 80.0–100.0)
Platelets: 306 10*3/uL (ref 150–400)
RBC: 4.45 MIL/uL (ref 3.87–5.11)
RDW: 13.2 % (ref 11.5–15.5)
WBC: 6.8 10*3/uL (ref 4.0–10.5)
nRBC: 0 % (ref 0.0–0.2)

## 2022-04-01 LAB — BASIC METABOLIC PANEL
Anion gap: 5 (ref 5–15)
BUN: 16 mg/dL (ref 8–23)
CO2: 30 mmol/L (ref 22–32)
Calcium: 9.8 mg/dL (ref 8.9–10.3)
Chloride: 108 mmol/L (ref 98–111)
Creatinine, Ser: 0.81 mg/dL (ref 0.44–1.00)
GFR, Estimated: >60 mL/min (ref >60)
Glucose, Bld: 99 mg/dL (ref 70–99)
Potassium: 3.7 mmol/L (ref 3.5–5.1)
Sodium: 143 mmol/L (ref 135–145)

## 2022-04-01 LAB — TROPONIN I (HIGH SENSITIVITY): Troponin I (High Sensitivity): 21 ng/L — ABNORMAL HIGH (ref <18)

## 2022-04-01 MED ORDER — HYDRALAZINE HCL 50 MG PO TABS: 50.0000 mg | ORAL_TABLET | Freq: Two times a day (BID) | ORAL | 0 refills | Status: DC

## 2022-04-01 NOTE — ED Notes (Signed)
Patient transported to X-ray 

## 2022-04-01 NOTE — Discharge Instructions (Addendum)
Please start taking the prescribed hydralazine 50 mg 2 times a day

## 2022-04-01 NOTE — ED Notes (Signed)
RN called granddaughter and she stated she will pick up pt in 15 min.

## 2022-04-01 NOTE — ED Provider Notes (Addendum)
[facility] Provider Note   Event Date/Time   First MD Initiated Contact with Patient 04/01/22 1501     (approximate) History  Chest Pain  HPI Andrea Phillips is a 87 y.o. female  Location: Chest Duration: Began this morning Timing: Slightly improved since onset Severity: 3/10 Quality: Aching Context: Patient states that she has had intermittent chest pain especially in the morning time over the last 2 weeks Modifying factors: Denies any exacerbating or relieving factors.  Specifically patient states pain does not worsen on exertion, with movement, or taking a deep breath Associated Symptoms: Hypertension ROS: Patient currently denies any vision changes, tinnitus, difficulty speaking, facial droop, sore throat, shortness of breath, abdominal pain, nausea/vomiting/diarrhea, dysuria, or weakness/numbness/paresthesias in any extremity   Physical Exam  Triage Vital Signs: ED Triage Vitals  Enc Vitals Group     BP 04/01/22 1429 (!) 209/82     Pulse Rate 04/01/22 1429 77     Resp 04/01/22 1429 13     Temp 04/01/22 1429 97.9 F (36.6 C)     Temp Source 04/01/22 1429 Oral     SpO2 04/01/22 1429 97 %     Weight 04/01/22 1428 150 lb (68 kg)     Height 04/01/22 1428 5\' 1"  (1.549 m)     Head Circumference --      Peak Flow --      Pain Score 04/01/22 1428 3     Pain Loc --      Pain Edu? --      Excl. in GC? --    Most recent vital signs: Vitals:   04/01/22 1429 04/01/22 1512  BP: (!) 209/82 (!) 177/74  Pulse: 77 68  Resp: 13 10  Temp: 97.9 F (36.6 C)   SpO2: 97% 95%   General: Awake, oriented x4. CV:  Good peripheral perfusion.  Resp:  Normal effort.  Abd:  No distention.  Other:  Elderly overweight Caucasian female laying in bed in no acute distress ED Results / Procedures / Treatments  Labs (all labs ordered are listed, but only abnormal results are displayed) Labs Reviewed  TROPONIN I (HIGH SENSITIVITY) - Abnormal; Notable for the  following components:      Result Value   Troponin I (High Sensitivity) 21 (*)    All other components within normal limits  BASIC METABOLIC PANEL  CBC   EKG ED ECG REPORT I, Merwyn KatosEvan K Lashea Goda, the attending physician, personally viewed and interpreted this ECG. Date: 04/01/2022 EKG Time: 1429 Rate: 77 Rhythm: normal sinus rhythm QRS Axis: normal Intervals: normal ST/T Wave abnormalities: normal Narrative Interpretation: no evidence of acute ischemia RADIOLOGY ED MD interpretation: 2 view chest x-ray interpreted by me shows no evidence of acute abnormalities including no pneumonia, pneumothorax, or widened mediastinum.  Patient does have signs of chronic bronchitic changes -Agree with radiology assessment Official radiology report(s): DG Chest 2 View  Result Date: 04/01/2022 CLINICAL DATA:  Chest pain, chest pressure beginning today, history hypertension, CHF EXAM: CHEST - 2 VIEW COMPARISON:  Portable exam of 02/21/2022 and 02/08/2022 FINDINGS: Normal heart size, mediastinal contours, and pulmonary vascularity. Atherosclerotic calcification aorta. Minimal chronic bronchitic changes with slight chronic accentuation of RIGHT basilar markings. No pulmonary infiltrate, pleural effusion, or pneumothorax. Bones demineralized. IMPRESSION: Chronic bronchitic changes without acute abnormalities. Aortic Atherosclerosis (ICD10-I70.0). Electronically Signed   By: Ulyses SouthwardMark  Boles M.D.   On: 04/01/2022 15:04   PROCEDURES: Critical Care performed: No .1-3 Lead EKG Interpretation  Performed by: Vicente MalesBradler,  Clent Jacks, MD Authorized by: Merwyn Katos, MD     Interpretation: normal     ECG rate:  71   ECG rate assessment: normal     Rhythm: sinus rhythm     Ectopy: none     Conduction: normal    MEDICATIONS ORDERED IN ED: Medications - No data to display IMPRESSION / MDM / ASSESSMENT AND PLAN / ED COURSE  I reviewed the triage vital signs and the nursing notes.                             The patient is  on the cardiac monitor to evaluate for evidence of arrhythmia and/or significant heart rate changes. Patient's presentation is most consistent with acute presentation with potential threat to life or bodily function. Workup: ECG, CXR, CBC, BMP, Troponin Findings: ECG: No overt evidence of STEMI. No evidence of Brugadas sign, delta wave, epsilon wave, significantly prolonged QTc, or malignant arrhythmia HS Troponin: At baseline at 21.  Repeat troponins from 1 month ago showed stable troponins around 18 Other Labs unremarkable for emergent problems. CXR: Without PTX, PNA, or widened mediastinum Last Stress Test:  2022 Last Heart Catheterization:  2023 HEART Score: 5  Given History, Exam, and Workup I have low suspicion for ACS, Pneumothorax, Pneumonia, Pulmonary Embolus, Tamponade, Aortic Dissection or other emergent problem as a cause for this presentation.  -Patient does have mildly elevated blood pressure as she did not take her blood pressure medicines this morning.  Upon review with patient, she has not been taking her hydralazine on time or as prescribed and therefore wrote for a refill as well as instructions to follow-up with Dr. Gwen Pounds, her cardiologist, in the next 2-3 days for reevaluation of her hypertension Reassesment: Prior to discharge patients pain was controlled and they were well appearing.  Disposition:  Discharge. Strict return precautions discussed with patient with full understanding. Advised patient to follow up promptly with primary care provider    FINAL CLINICAL IMPRESSION(S) / ED DIAGNOSES   Final diagnoses:  Nonspecific chest pain   Rx / DC Orders   ED Discharge Orders          Ordered    hydrALAZINE (APRESOLINE) 50 MG tablet  2 times daily        04/01/22 1532           Note:  This document was prepared using Dragon voice recognition software and may include unintentional dictation errors.   Merwyn Katos, MD 04/01/22 1601    Merwyn Katos, MD 04/01/22 [phone]

## 2022-04-01 NOTE — ED Triage Notes (Signed)
Pt arrives via EMS from home with chest pressure that started today. Pt allergic to ASA so EMS did not give. Pt hypertensive but has not taken HTN meds today.

## 2022-04-01 NOTE — ED Notes (Signed)
Pt attempting to call family to pick her up for discharge

## 2022-04-08 ENCOUNTER — Other Ambulatory Visit: Payer: Self-pay | Admitting: Obstetrics and Gynecology

## 2022-04-08 ENCOUNTER — Other Ambulatory Visit: Payer: Self-pay | Admitting: Student in an Organized Health Care Education/Training Program

## 2022-05-20 ENCOUNTER — Observation Stay
Admission: EM | Admit: 2022-05-20 | Discharge: 2022-05-22 | Disposition: A | Discharge status: Home / Self Care | Payer: Medicare Other | Attending: Internal Medicine | Admitting: Internal Medicine

## 2022-05-20 ENCOUNTER — Emergency Department: Payer: Medicare Other

## 2022-05-20 ENCOUNTER — Other Ambulatory Visit: Payer: Self-pay

## 2022-05-20 ENCOUNTER — Encounter: Payer: Self-pay | Admitting: *Deleted

## 2022-05-20 DIAGNOSIS — I10 Essential (primary) hypertension: Secondary | ICD-10-CM | POA: Diagnosis present

## 2022-05-20 DIAGNOSIS — Z7901 Long term (current) use of anticoagulants: Secondary | ICD-10-CM | POA: Insufficient documentation

## 2022-05-20 DIAGNOSIS — Z96652 Presence of left artificial knee joint: Secondary | ICD-10-CM | POA: Insufficient documentation

## 2022-05-20 DIAGNOSIS — I480 Paroxysmal atrial fibrillation: Secondary | ICD-10-CM | POA: Diagnosis not present

## 2022-05-20 DIAGNOSIS — R001 Bradycardia, unspecified: Principal | ICD-10-CM | POA: Diagnosis present

## 2022-05-20 DIAGNOSIS — Z8673 Personal history of transient ischemic attack (TIA), and cerebral infarction without residual deficits: Secondary | ICD-10-CM | POA: Insufficient documentation

## 2022-05-20 DIAGNOSIS — Z79899 Other long term (current) drug therapy: Secondary | ICD-10-CM | POA: Diagnosis not present

## 2022-05-20 DIAGNOSIS — R778 Other specified abnormalities of plasma proteins: Secondary | ICD-10-CM | POA: Diagnosis not present

## 2022-05-20 DIAGNOSIS — N179 Acute kidney failure, unspecified: Secondary | ICD-10-CM | POA: Diagnosis present

## 2022-05-20 DIAGNOSIS — G4730 Sleep apnea, unspecified: Secondary | ICD-10-CM | POA: Diagnosis present

## 2022-05-20 DIAGNOSIS — J45909 Unspecified asthma, uncomplicated: Secondary | ICD-10-CM | POA: Insufficient documentation

## 2022-05-20 DIAGNOSIS — R55 Syncope and collapse: Secondary | ICD-10-CM | POA: Diagnosis present

## 2022-05-20 DIAGNOSIS — I2510 Atherosclerotic heart disease of native coronary artery without angina pectoris: Secondary | ICD-10-CM | POA: Diagnosis present

## 2022-05-20 DIAGNOSIS — I493 Ventricular premature depolarization: Secondary | ICD-10-CM | POA: Diagnosis not present

## 2022-05-20 DIAGNOSIS — I5032 Chronic diastolic (congestive) heart failure: Secondary | ICD-10-CM | POA: Insufficient documentation

## 2022-05-20 DIAGNOSIS — I110 Hypertensive heart disease with heart failure: Secondary | ICD-10-CM | POA: Diagnosis not present

## 2022-05-20 DIAGNOSIS — E876 Hypokalemia: Secondary | ICD-10-CM | POA: Diagnosis not present

## 2022-05-20 DIAGNOSIS — E039 Hypothyroidism, unspecified: Secondary | ICD-10-CM | POA: Diagnosis not present

## 2022-05-20 DIAGNOSIS — R7989 Other specified abnormal findings of blood chemistry: Secondary | ICD-10-CM | POA: Diagnosis present

## 2022-05-20 DIAGNOSIS — G4733 Obstructive sleep apnea (adult) (pediatric): Secondary | ICD-10-CM | POA: Diagnosis present

## 2022-05-20 DIAGNOSIS — E782 Mixed hyperlipidemia: Secondary | ICD-10-CM | POA: Diagnosis present

## 2022-05-20 DIAGNOSIS — I4891 Unspecified atrial fibrillation: Secondary | ICD-10-CM | POA: Diagnosis present

## 2022-05-20 DIAGNOSIS — I5022 Chronic systolic (congestive) heart failure: Secondary | ICD-10-CM | POA: Diagnosis present

## 2022-05-20 LAB — CBC
HCT: 35.1 % — ABNORMAL LOW (ref 36.0–46.0)
Hemoglobin: 11.5 g/dL — ABNORMAL LOW (ref 12.0–15.0)
MCH: 29.8 pg (ref 26.0–34.0)
MCHC: 32.8 g/dL (ref 30.0–36.0)
MCV: 90.9 fL (ref 80.0–100.0)
Platelets: 208 K/uL (ref 150–400)
RBC: 3.86 MIL/uL — ABNORMAL LOW (ref 3.87–5.11)
RDW: 13.5 % (ref 11.5–15.5)
WBC: 6.4 K/uL (ref 4.0–10.5)
nRBC: 0 % (ref 0.0–0.2)

## 2022-05-20 LAB — BASIC METABOLIC PANEL WITH GFR
Anion gap: 9 (ref 5–15)
BUN: 21 mg/dL (ref 8–23)
CO2: 28 mmol/L (ref 22–32)
Calcium: 8.8 mg/dL — ABNORMAL LOW (ref 8.9–10.3)
Chloride: 104 mmol/L (ref 98–111)
Creatinine, Ser: 1.51 mg/dL — ABNORMAL HIGH (ref 0.44–1.00)
GFR, Estimated: 34 mL/min — ABNORMAL LOW
Glucose, Bld: 118 mg/dL — ABNORMAL HIGH (ref 70–99)
Potassium: 3 mmol/L — ABNORMAL LOW (ref 3.5–5.1)
Sodium: 141 mmol/L (ref 135–145)

## 2022-05-20 LAB — MAGNESIUM: Magnesium: 2.3 mg/dL (ref 1.7–2.4)

## 2022-05-20 LAB — TROPONIN I (HIGH SENSITIVITY)
Troponin I (High Sensitivity): 22 ng/L — ABNORMAL HIGH (ref <18)
Troponin I (High Sensitivity): 25 ng/L — ABNORMAL HIGH (ref <18)

## 2022-05-20 LAB — TSH: TSH: 1.071 u[IU]/mL (ref 0.350–4.500)

## 2022-05-20 MED ORDER — POTASSIUM CHLORIDE CRYS ER 20 MEQ PO TBCR
40.0000 meq | EXTENDED_RELEASE_TABLET | Freq: Once | ORAL | Status: AC
Start: 2022-05-20 — End: 2022-05-20
  Administered 2022-05-20: 40 meq via ORAL
  Filled 2022-05-20: qty 2

## 2022-05-20 MED ORDER — ACETAMINOPHEN 325 MG PO TABS: 650.0000 mg | ORAL_TABLET | Freq: Four times a day (QID) | ORAL | Status: DC | PRN

## 2022-05-20 MED ORDER — FLUTICASONE PROPIONATE 50 MCG/ACT NA SUSP
1.0000 | Freq: Two times a day (BID) | NASAL | Status: DC | PRN
Start: 2022-05-20 — End: 2022-05-22

## 2022-05-20 MED ORDER — HYDRALAZINE HCL 20 MG/ML IJ SOLN
5.0000 mg | Freq: Four times a day (QID) | INTRAMUSCULAR | Status: DC | PRN
Start: 2022-05-20 — End: 2022-05-22

## 2022-05-20 MED ORDER — ONDANSETRON HCL 4 MG PO TABS: 4.0000 mg | ORAL_TABLET | Freq: Four times a day (QID) | ORAL | Status: DC | PRN

## 2022-05-20 MED ORDER — LACTATED RINGERS IV SOLN: INTRAVENOUS | Status: AC

## 2022-05-20 MED ORDER — POTASSIUM CITRATE-CITRIC ACID 1100-334 MG/5ML PO SOLN
20.0000 meq | Freq: Once | ORAL | Status: AC
  Administered 2022-05-20: 20 meq via ORAL
  Filled 2022-05-20: qty 10

## 2022-05-20 MED ORDER — METOPROLOL TARTRATE 5 MG/5ML IV SOLN: 5.0000 mg | INTRAVENOUS | Status: DC | PRN

## 2022-05-20 MED ORDER — AMIODARONE HCL 200 MG PO TABS
200.0000 mg | ORAL_TABLET | Freq: Every day | ORAL | Status: DC
  Administered 2022-05-21 – 2022-05-22 (×2): 200 mg via ORAL
  Filled 2022-05-20 (×2): qty 1

## 2022-05-20 MED ORDER — ONDANSETRON HCL 4 MG/2ML IJ SOLN
4.0000 mg | Freq: Four times a day (QID) | INTRAMUSCULAR | Status: DC | PRN
Start: 2022-05-20 — End: 2022-05-22

## 2022-05-20 MED ORDER — LEVOTHYROXINE SODIUM 112 MCG PO TABS
112.0000 ug | ORAL_TABLET | Freq: Every day | ORAL | Status: DC
  Administered 2022-05-21 – 2022-05-22 (×2): 112 ug via ORAL
  Filled 2022-05-20 (×2): qty 1

## 2022-05-20 MED ORDER — SODIUM CHLORIDE 0.9 % IV BOLUS
500.0000 mL | Freq: Once | INTRAVENOUS | Status: AC
Start: 2022-05-20 — End: 2022-05-20
  Administered 2022-05-20: 500 mL via INTRAVENOUS

## 2022-05-20 MED ORDER — TRAMADOL HCL 50 MG PO TABS: 50.0000 mg | ORAL_TABLET | Freq: Two times a day (BID) | ORAL | Status: DC | PRN

## 2022-05-20 MED ORDER — HYDRALAZINE HCL 50 MG PO TABS
50.0000 mg | ORAL_TABLET | Freq: Two times a day (BID) | ORAL | Status: DC
  Administered 2022-05-21 – 2022-05-22 (×3): 50 mg via ORAL
  Filled 2022-05-20 (×4): qty 1

## 2022-05-20 MED ORDER — ALBUTEROL SULFATE (2.5 MG/3ML) 0.083% IN NEBU
2.5000 mg | INHALATION_SOLUTION | Freq: Four times a day (QID) | RESPIRATORY_TRACT | Status: DC | PRN
Start: 2022-05-20 — End: 2022-05-22

## 2022-05-20 MED ORDER — APIXABAN 2.5 MG PO TABS
2.5000 mg | ORAL_TABLET | Freq: Two times a day (BID) | ORAL | Status: DC
  Administered 2022-05-20 – 2022-05-21 (×2): 2.5 mg via ORAL
  Filled 2022-05-20 (×2): qty 1

## 2022-05-20 MED ORDER — PRAVASTATIN SODIUM 40 MG PO TABS
40.0000 mg | ORAL_TABLET | Freq: Every day | ORAL | Status: DC
  Administered 2022-05-20 – 2022-05-21 (×2): 40 mg via ORAL
  Filled 2022-05-20: qty 1
  Filled 2022-05-20: qty 2

## 2022-05-20 MED ORDER — MOMETASONE FURO-FORMOTEROL FUM 200-5 MCG/ACT IN AERO
2.0000 | INHALATION_SPRAY | Freq: Two times a day (BID) | RESPIRATORY_TRACT | Status: DC
Start: 2022-05-20 — End: 2022-05-22
  Administered 2022-05-20 – 2022-05-22 (×4): 2 via RESPIRATORY_TRACT
  Filled 2022-05-20 (×2): qty 8.8

## 2022-05-20 MED ORDER — ICOSAPENT ETHYL 1 G PO CAPS
2.0000 g | ORAL_CAPSULE | Freq: Two times a day (BID) | ORAL | Status: DC
  Administered 2022-05-20 – 2022-05-22 (×4): 2 g via ORAL
  Filled 2022-05-20 (×4): qty 2

## 2022-05-20 MED ORDER — ACETAMINOPHEN 650 MG RE SUPP
650.0000 mg | Freq: Four times a day (QID) | RECTAL | Status: DC | PRN
Start: 2022-05-20 — End: 2022-05-22

## 2022-05-20 NOTE — Progress Notes (Signed)
Eeg done 

## 2022-05-20 NOTE — Assessment & Plan Note (Signed)
-   Levothyroxine 112 mcg daily resumed 

## 2022-05-20 NOTE — Procedures (Signed)
Patient Name: Andrea Phillips  MRN: [id number]  Epilepsy Attending: Charlsie Quest  Referring Physician/Provider: Lovenia Kim, DO  Date: 05/20/2022 Duration: 20.21 mins  Patient history: 87yo F with syncope. EEG to evaluate for seizure  Level of alertness: Awake  AEDs during EEG study: None  Technical aspects: This EEG study was done with scalp electrodes positioned according to the 10-20 International system of electrode placement. Electrical activity was reviewed with band pass filter of 1-70Hz , sensitivity of 7 uV/mm, display speed of 70mm/sec with a 60Hz  notched filter applied as appropriate. EEG data were recorded continuously and digitally stored.  Video monitoring was available and reviewed as appropriate.  Description: The posterior dominant rhythm consists of 8 Hz activity of moderate voltage (25-35 uV) seen predominantly in posterior head regions, symmetric and reactive to eye opening and eye closing. Hyperventilation and photic stimulation were not performed.     IMPRESSION: This study is within normal limits. No seizures or epileptiform discharges were seen throughout the recording.  Shatina Streets 

## 2022-05-20 NOTE — ED Triage Notes (Signed)
Per EMS report, patient had a syncopal episode today, which was her second one in two days. Patient was found on the floor conscious and was able to bear weight when standing, but had another near-syncopal episode. Patient also fell yesterday and c/o left hip pain. Family states patient had recent change in blood pressure meds. EMS reports a pulse of 38. Patient was incontinent of bowels today after fall.  CBG 152 38 pulse 111/81  450 NS infused upon arrival.

## 2022-05-20 NOTE — Assessment & Plan Note (Signed)
-  Continue amiodarone -Continue Eliquis -Keep holding Coreg

## 2022-05-20 NOTE — Hospital Course (Signed)
Ms. Andrea Phillips is an 87 year old female with history of hypothyroid, atrial fibrillation on Eliquis, hypertension, hyperlipidemia, CAD, history of left PCA territory infarct, who presents emergency department for chief concerns of falling and syncope.  She reports that she was shelling peas on the porch and was getting up to use the bathroom to have a bowel movement when she must have passed out.  The next thing she knew she was laying on the floor.  Her great grandson found her and called EMS.   At bedside she denies chest pain, shortness of breath, dysphagia, fever, new cough, dysuria, hematuria.   She endorses soiling herself, bowel incontinence during the syncope episode.  Patient does not know the duration but states that she was not out for very long.   She reports that within the last 2 weeks she has had 2 episodes of passing out, the first being in her backyard.  Initial vitals in the emergency department showed temperature of 98.3, respiration rate of 16, heart rate of 52, blood pressure 111/39, SPO2 of 95% on room air.  Serum sodium is 141, potassium 3.0, chloride 104, bicarb 22, BUN of 21, serum creatinine 1.51, GFR 34, WBC 6.4, hemoglobin 11.5, platelets of 208.  High sensitive troponin is 22>>25  ED treatment: Potassium chloride 40 mEq p.o. one-time dose, sodium chloride 500 mL bolus  7/29; patient initially presented with junctional bradycardia which improved to sinus bradycardia during the first hour in the ED without any intervention..  Her carvedilol dose was recently increased to 12.5 mg twice daily few days ago.  Had 2 syncopal episodes in the past 2 to 3 days.  EEG negative for any seizure-like activity. Cardiology is recommending continuation of amiodarone and holding Coreg. CT hip was done with concern of left hip pain secondary to fall, it was negative for any acute fractures or dislocations.  Also noted to have acute nondisplaced fracture of S4 and mild  osteoarthritis of left hip. PT/OT evaluation.  Patient developed sudden onset chest pain and presyncope requiring rapid response call this morning.  She was complaining of substernal pressure like chest pain, nonradiating, associated with shortness of breath.  She became transiently hypotensive but blood pressure improved initially without any intervention.  First set of troponin at 24 which is consistent with the prior checks, EKG without any acute abnormality, patient received nitroglycerin and 0.5 mg of Ativan for excessive anxiety.  Blood pressure again becoming little soft and she was given 500 cc of normal saline bolus.  2 nieces at bedside. According to the family patient is becoming more anxious due to the recent death of her cousin.  May 29, 2023: Clinically stable.  Troponin remained barely positive with a flat curve.  Echocardiogram with some improvement of EF to 50 to 55%, no regional wall motion abnormalities and normal diastolic function.  Discussed with cardiology and they would like to continue amiodarone, heart rate mostly in low 50s.  Patient will follow-up with Dr. Gwen Pounds, her cardiologist for the next few days to review her medications.  We stopped hydralazine and carvedilol.  She will continue her ARB, amiodarone and Imdur.  She will continue the rest of her home medications and follow-up with her providers.

## 2022-05-20 NOTE — ED Notes (Signed)
Pt had small BM. Cleaned and purwick placed.

## 2022-05-20 NOTE — Assessment & Plan Note (Addendum)
Potassium of 3.4 with magnesium of 2.3. -Replace potassium and monitor

## 2022-05-20 NOTE — H&P (Addendum)
History and Physical   Andrea Phillips [id number] DOB: 02/23/1935 DOA: 05/20/2022  PCP: Perrin Maltese, MD  Outpatient Specialists: Dr. Stevphen [facility] clinic cardiology Patient coming from: home via EMS  I have personally briefly reviewed patient's old medical records in [place].  Chief Concern: Syncope  HPI: Ms. Andrea Phillips is an 87 year old female with history of hypothyroid, atrial fibrillation on Eliquis, hypertension, hyperlipidemia, CAD, history of left PCA territory infarct, who presents emergency department for chief concerns of falling and syncope.  Initial vitals in the emergency department showed temperature of 98.3, respiration rate of 16, heart rate of 52, blood pressure 111/39, SPO2 of 95% on room air.  Serum sodium is 141, potassium 3.0, chloride 104, bicarb 22, BUN of 21, serum creatinine 1.51, GFR 34, WBC 6.4, hemoglobin 11.5, platelets of 208.  High sensitive troponin is 22.  ED treatment: Potassium chloride 40 mEq p.o. one-time dose, sodium chloride 500 mL bolus  At bedside, she was able to tell me her name, age, current location. She knows her niece, Santiago Glad at bedside.   She reports that she was shelling peas on the porch and was getting up to use the bathroom to have a bowel movement when she must have passed out.  The next thing she knew she was laying on the floor.  Her great grandson found her and called EMS.  At bedside she denies chest pain, shortness of breath, dysphagia, fever, new cough, dysuria, hematuria.  She endorses soiling herself, bowel incontinence during the syncope episode.  Patient does not know the duration but states that she was not out for very long.  She reports that within the last 2 weeks she has had 2 episodes of passing out, the first being in her backyard.  Social history: She lives at home and her granddaughter lives with her. She denies tobacco, etoh, recreational drug use. 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  ROS: Constitutional: no weight change, no fever ENT/Mouth: no sore throat, no rhinorrhea Eyes: no eye pain, no vision changes Cardiovascular: no chest pain, no dyspnea,  no edema, no palpitations Respiratory: no cough, no sputum, no wheezing Gastrointestinal: + nausea, no vomiting, no diarrhea, no constipation Genitourinary: no urinary incontinence, no dysuria, no hematuria Musculoskeletal: no arthralgias, no myalgias Skin: no skin lesions, no pruritus, Neuro: + weakness, + loss of consciousness, + syncope Psych: no anxiety, no depression, + decrease appetite Heme/Lymph: no bruising, no bleeding  ED Course: Discussed with emergency medicine provider, patient requiring hospitalization for chief concerns of syncope.  Assessment/Plan  Principal Problem:   Symptomatic bradycardia Active Problems:   Atrial fibrillation and flutter (HCC)   AKI (acute kidney injury) ([place])   Coronary artery disease involving native coronary artery of native heart without angina pectoris   Essential hypertension   Syncope and collapse   Frequent PVCs   OSA (obstructive sleep apnea)   Elevated troponin   Hypothyroidism   Hyperlipidemia, mixed   Sleep apnea with use of continuous positive airway pressure (CPAP)   Hypokalemia   Assessment and Plan:  * Symptomatic bradycardia - We will plan to hold Coreg and amiodarone on admission and will resume per cardiology recommendations - Cardiology has been consulted and we appreciate further recommendations - Admit to telemetry cardiac, observation  Atrial fibrillation and flutter (HCC) - Apixaban home dose of 5 mg p.o. twice daily has been held on admission due to patient with acute kidney injury - Apixaban 2.5  mg p.o. twice daily has been ordered on admission  AKI (acute kidney injury) ([place]) - Presumed prerenal secondary to dehydration in setting of being outside in the heat along with concurrent use of  diuresis medication - Status post sodium chloride 500 mL bolus per EDP - Ordered LR IVF at 100 mL/h, 10 hours ordered to complete 1 L bag - BMP in the a.m.  Hypokalemia - Status post potassium chloride 40 mEq tablet per EDP - Ordered additional Polycitra 20 mEq on admission - BMP in the a.m.  Sleep apnea with use of continuous positive airway pressure (CPAP) - CPAP nightly resumed  Hyperlipidemia, mixed - Pravastatin 40 mg daily resumed  Hypothyroidism - Levothyroxine 112 mcg daily resumed  Elevated troponin - Low clinical suspicion for ACS at this time as patient denies chest pain - I suspect this is myocardial injury/NSTEMI type II due to demand ischemia  Syncope and collapse - Presumptive diagnosis on admission is secondary to symptomatic bradycardia and possible dehydration  Essential hypertension - I am holding Coreg 12.5 mg p.o. twice daily, furosemide 20 mg daily, isosorbide mononitrate 30 mg daily and telmisartan 80 mg daily on admission - Hydralazine 5 mg IV every 6 hours as needed for SBP greater than 180, 4 days ordered  Chart reviewed.   Complete echo on 02/09/2022: Left ventricular ejection fraction estimated at 40 to [id number], grade 2 diastolic dysfunction.  DVT prophylaxis: Eliquis Code Status: full code, just code for one time Diet: Heart healthy Family Communication: Updated Karen at bedside with patients permission Disposition Plan: Pending clinical course Consults called: Cardiology Admission status: Telemetry cardiac, observation  Past Medical History:  Diagnosis Date   Arthritis    ra   Asthma    CHF (congestive heart failure) ([place])    Dysrhythmia    Edema extremities    GERD (gastroesophageal reflux disease)    Gout    Headache    History of hiatal hernia    Hypertension    Hypothyroidism    Shortness of breath dyspnea    Sleep apnea    Past Surgical History:  Procedure Laterality Date   APPENDECTOMY     BACK SURGERY     CARDIAC  CATHETERIZATION     CATARACT EXTRACTION W/PHACO Left 03/09/2015   Procedure: CATARACT EXTRACTION PHACO AND INTRAOCULAR LENS PLACEMENT ([place]);  Surgeon: Estill Cotta, MD;  Location: [facility];  Service: Ophthalmology;  Laterality: Left;  [place] 01:31 AP% 26.1 CDE 43.72   CATARACT EXTRACTION W/PHACO Right 08/19/2021   Procedure: CATARACT EXTRACTION PHACO AND INTRAOCULAR LENS PLACEMENT ([place]) RIGHT;  Surgeon: Birder Robson, MD;  Location: [facility];  Service: Ophthalmology;  Laterality: Right;  12.31 1:07.0   CATARACT EXTRACTION W/PHACO Right 09/07/2021   Procedure: REMOVAL OF LENS FRAGMENTS RIGHT;  Surgeon: Birder Robson, MD;  Location: [facility];  Service: Ophthalmology;  Laterality: Right;   CHOLECYSTECTOMY     CORONARY/GRAFT ACUTE MI REVASCULARIZATION N/A 10/24/2021   Procedure: Coronary/Graft Acute MI Revascularization;  Surgeon: Nelva Bush, MD;  Location: [facility];  Service: Cardiovascular;  Laterality: N/A;   EYE SURGERY     retina   JOINT REPLACEMENT     left knee   LEFT HEART CATH AND CORONARY ANGIOGRAPHY N/A 04/02/2021   Procedure: LEFT HEART CATH AND CORONARY ANGIOGRAPHY;  Surgeon: Corey Skains, MD;  Location: [facility];  Service: Cardiovascular;  Laterality: N/A;   LEFT HEART CATH AND CORONARY ANGIOGRAPHY N/A 10/24/2021   Procedure: LEFT HEART CATH AND  CORONARY ANGIOGRAPHY;  Surgeon: Yvonne Kendall, MD;  Location: [facility];  Service: Cardiovascular;  Laterality: N/A;   Social History:  reports that she has never smoked. She has never used smokeless tobacco. She reports that she does not drink alcohol and does not use drugs.  Allergies  Allergen Reactions   Aspirin Hives   Other Hives    Yellow dye 6 FOOD COLOR YELLOW POWDER    Atorvastatin Other (See Comments)    unknown   Codeine Other (See Comments)    Doesn't know   Conj Estrog-Medroxyprogest Ace Other (See Comments)    PREMPRO 0.3-1.5 MG ORAL TABLET unknown    Prednisone Other (See Comments)    Chest pain   Raloxifene Other (See Comments)    EVISTA 60 MG ORAL TABLET unknown   Ace Inhibitors Cough   Amlodipine Itching   Valsartan Itching   Family History  Problem Relation Age of Onset   Heart disease Mother    Heart disease Father    Breast cancer Cousin    Family history: Family history reviewed and not pertinent  Prior to Admission medications   Medication Sig Start Date End Date Taking? Authorizing Provider  albuterol (VENTOLIN HFA) 108 (90 Base) MCG/ACT inhaler Inhale 2 puffs into the lungs every 6 (six) hours as needed for wheezing or shortness of breath.    [provider]  amiodarone (PACERONE) 200 MG tablet Take 1 tablet (200 mg total) by mouth daily. 02/12/22   Arnetha Courser, MD  apixaban (ELIQUIS) 5 MG TABS tablet Take 1 tablet (5 mg total) by mouth 2 (two) times daily. 02/11/22   Arnetha Courser, MD  cetirizine (ZYRTEC) 10 MG tablet Take 10 mg by mouth daily.    [provider]  fluticasone (FLONASE) 50 MCG/ACT nasal spray Place 1 spray into both nostrils 2 (two) times daily as needed for allergies or rhinitis.    [provider]  furosemide (LASIX) 20 MG tablet Take 1 tablet (20 mg total) by mouth daily. Patient taking differently: Take 40 mg by mouth daily. 11/16/21 03/28/22  Delfino Lovett, MD  hydrALAZINE (APRESOLINE) 50 MG tablet Take 1 tablet (50 mg total) by mouth 2 (two) times daily. 04/01/22 05/01/22  Merwyn Katos, MD  icosapent Ethyl (VASCEPA) 1 g capsule Take 2 g by mouth 2 (two) times daily.    [provider]  levothyroxine (SYNTHROID) 112 MCG tablet Take 112 mcg by mouth daily. 02/23/21   [provider]  magnesium oxide (MAG-OX) 400 MG tablet Take 400 mg by mouth daily. Patient not taking: Reported on 02/09/2022    [provider]  mometasone-formoterol (DULERA) 200-5 MCG/ACT AERO Inhale 2 puffs into the lungs 2 (two) times daily. 04/04/21   Almon Hercules, MD  omeprazole  (PRILOSEC OTC) 20 MG tablet Take 20 mg by mouth daily. Patient not taking: Reported on 03/28/2022    [provider]  ondansetron (ZOFRAN-ODT) 4 MG disintegrating tablet Take 1 tablet (4 mg total) by mouth every 8 (eight) hours as needed for nausea or vomiting. 02/21/22   Jene Every, MD  pravastatin (PRAVACHOL) 40 MG tablet Take 1 tablet (40 mg total) by mouth daily at 6 PM. 02/11/22   Arnetha Courser, MD  telmisartan (MICARDIS) 80 MG tablet Take 80 mg by mouth daily. 11/01/21   [provider]  traMADol (ULTRAM) 50 MG tablet Take 1 tablet (50 mg total) by mouth every 12 (twelve) hours as needed for moderate pain or severe pain. 02/11/22  Lorella Nimrod, MD   Physical Exam: Vitals:   05/20/22 1319 05/20/22 1325 05/20/22 1341  BP: (!) 91/54  (!) 111/39  Pulse: (!) 52  (!) 52  Resp: 18  16  Temp: 98.3 F (36.8 C)    TempSrc: Oral  Oral  SpO2: 94%  95%  Weight:  68.5 kg   Height:  5\' 1"  (1.549 m)    Constitutional: appears , NAD, calm, comfortable Eyes: PERRL, lids and conjunctivae normal ENMT: Mucous membranes are moist. Posterior pharynx clear of any exudate or lesions. Age-appropriate dentition. Hearing appropriate Neck: normal, supple, no masses, no thyromegaly Respiratory: clear to auscultation bilaterally, no wheezing, no crackles. Normal respiratory effort. No accessory muscle use.  Cardiovascular: Regular rate and rhythm, no murmurs / rubs / gallops. No extremity edema. 2+ pedal pulses. No carotid bruits.  Abdomen: no tenderness, no masses palpated, no hepatosplenomegaly. Bowel sounds positive.  Musculoskeletal: no clubbing / cyanosis. No joint deformity upper and lower extremities. Good ROM, no contractures, no atrophy. Normal muscle tone.  Skin: no rashes, lesions, ulcers. No induration Neurologic: Sensation intact. Strength 5/5 in all 4.  Psychiatric: Normal judgment and insight. Alert and oriented x 3. Normal mood.   EKG: independently reviewed, showing sinus  bradycardia with rate of 42, PVCs, QTc 455  Chest x-ray on Admission: I personally reviewed and I agree with radiologist reading as below.  CT Hip Left Wo Contrast  Result Date: 05/20/2022 CLINICAL DATA:  Near syncopal episode, found on the floor EXAM: CT OF THE LEFT HIP WITHOUT CONTRAST TECHNIQUE: Multidetector CT imaging of the left hip was performed according to the standard protocol. Multiplanar CT image reconstructions were also generated. RADIATION DOSE REDUCTION: This exam was performed according to the departmental dose-optimization program which includes automated exposure control, adjustment of the mA and/or kV according to patient size and/or use of iterative reconstruction technique. COMPARISON:  None Available. FINDINGS: Bones/Joint/Cartilage No hip fracture or dislocation. Nondisplaced fracture of the S4 vertebral body. Normal alignment. No joint effusion. Mild osteoarthritis of the left hip. Ligaments Ligaments are suboptimally evaluated by CT. Muscles and Tendons Muscles are normal. No muscle atrophy. No intramuscular fluid collection or hematoma. Soft tissue No fluid collection or hematoma. No soft tissue mass. Diverticulosis without evidence of diverticulitis. IMPRESSION: 1. No acute fracture or dislocation of the left hip. 2. Acute nondisplaced fracture of the S4 vertebral body. 3. Mild osteoarthritis of the left hip. Electronically Signed   By: Kathreen Devoid M.D.   On: 05/20/2022 15:20   DG Chest 1 View  Result Date: 05/20/2022 CLINICAL DATA:  Syncopal episode with fall 2 days ago. Asthma. Congestive heart failure. EXAM: CHEST  1 VIEW COMPARISON:  None Available. FINDINGS: The heart size and mediastinal contours are within normal limits. Both lungs are clear. The visualized skeletal structures are unremarkable. IMPRESSION: No active disease. Electronically Signed   By: Marlaine Hind M.D.   On: 05/20/2022 14:19   DG Hip Unilat W or Wo Pelvis 2-3 Views Left  Result Date:  05/20/2022 CLINICAL DATA:  Fall 2 days ago.  Left hip pain. EXAM: DG HIP (WITH OR WITHOUT PELVIS) 2-3V LEFT COMPARISON:  None Available. FINDINGS: Subtle linear lucency and cortical step-off is seen in the intertrochanteric region of the left hip, suspicious for nondisplaced hip fracture. No other fractures identified. No evidence of dislocation. Mild-to-moderate left hip osteoarthritis is noted. IMPRESSION: Suspect nondisplaced intertrochanteric fracture of the left hip. Recommend further evaluation with left hip CT. Mild to moderate left hip osteoarthritis.  Electronically Signed   By: Danae Orleans M.D.   On: 05/20/2022 14:19   CT Head Wo Contrast  Result Date: 05/20/2022 CLINICAL DATA:  Neck trauma (Age >= 65y); Head trauma, minor (Age >= 65y) EXAM: CT HEAD WITHOUT CONTRAST CT CERVICAL SPINE WITHOUT CONTRAST TECHNIQUE: Multidetector CT imaging of the head and cervical spine was performed following the standard protocol without intravenous contrast. Multiplanar CT image reconstructions of the cervical spine were also generated. RADIATION DOSE REDUCTION: This exam was performed according to the departmental dose-optimization program which includes automated exposure control, adjustment of the mA and/or kV according to patient size and/or use of iterative reconstruction technique. 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 No evidence of acute infarction, hemorrhage, hydrocephalus, extra-axial collection or mass lesion/mass effect. Vascular: Atherosclerotic calcifications involving the large vessels of the skull base. No unexpected hyperdense vessel. Skull: Normal. Negative for fracture or focal lesion. Sinuses/Orbits: Mucosal thickening within the left maxillary sinus. Other: None. CT CERVICAL SPINE FINDINGS Alignment: Facet joints are aligned without dislocation or traumatic listhesis. Dens and lateral masses are  aligned. Straightening of the cervical lordosis. Skull base and vertebrae: No acute fracture. Unchanged degenerative subchondral cyst within the odontoid process. Bulky anterior endplate osteophytes throughout the mid to lower cervical spine. No suspicious lytic or sclerotic bone lesion. Soft tissues and spinal canal: No prevertebral fluid or swelling. No visible canal hematoma. Disc levels: Multilevel cervical spondylosis, not appreciably progressed from prior. Upper chest: No acute findings. Other: None. IMPRESSION: 1. No acute intracranial abnormality. 2. 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 3. No acute cervical spine fracture or subluxation. 4. Multilevel cervical spondylosis, not appreciably progressed from prior. Electronically Signed   By: Duanne Guess D.O.   On: 05/20/2022 14:10   CT Cervical Spine Wo Contrast  Result Date: 05/20/2022 CLINICAL DATA:  Neck trauma (Age >= 65y); Head trauma, minor (Age >= 65y) EXAM: CT HEAD WITHOUT CONTRAST CT CERVICAL SPINE WITHOUT CONTRAST TECHNIQUE: Multidetector CT imaging of the head and cervical spine was performed following the standard protocol without intravenous contrast. Multiplanar CT image reconstructions of the cervical spine were also generated. RADIATION DOSE REDUCTION: This exam was performed according to the departmental dose-optimization program which includes automated exposure control, adjustment of the mA and/or kV according to patient size and/or use of iterative reconstruction technique. 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 No evidence of acute infarction, hemorrhage, hydrocephalus, extra-axial collection or mass lesion/mass effect. Vascular: Atherosclerotic calcifications involving the large vessels of the skull base. No unexpected hyperdense vessel. Skull: Normal. Negative for fracture or focal lesion. Sinuses/Orbits:  Mucosal thickening within the left maxillary sinus. Other: None. CT CERVICAL SPINE FINDINGS Alignment: Facet joints are aligned without dislocation or traumatic listhesis. Dens and lateral masses are aligned. Straightening of the cervical lordosis. Skull base and vertebrae: No acute fracture. Unchanged degenerative subchondral cyst within the odontoid process. Bulky anterior endplate osteophytes throughout the mid to lower cervical spine. No suspicious lytic or sclerotic bone lesion. Soft tissues and spinal canal: No prevertebral fluid or swelling. No visible canal hematoma. Disc levels: Multilevel cervical spondylosis, not appreciably progressed from prior. Upper chest: No acute findings. Other: None. IMPRESSION: 1. No acute intracranial abnormality. 2. 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 3. No acute cervical spine fracture or subluxation. 4. Multilevel cervical spondylosis, not appreciably progressed from prior. Electronically  Signed   By: Davina Poke D.O.   On: 05/20/2022 14:10    Labs on Admission: I have personally reviewed following labs  CBC: Recent Labs  Lab 05/20/22 1327  WBC 6.4  HGB 11.5*  HCT 35.1*  MCV 90.9  PLT 123XX123   Basic Metabolic Panel: Recent Labs  Lab 05/20/22 1327 05/20/22 1337  NA 141  --   K 3.0*  --   CL 104  --   CO2 28  --   GLUCOSE 118*  --   BUN 21  --   CREATININE 1.51*  --   CALCIUM 8.8*  --   MG  --  2.3   GFR: Estimated Creatinine Clearance: 24.1 mL/min (A) (by C-G formula based on SCr of 1.51 mg/dL (H)).  Urine analysis:    Component Value Date/Time   COLORURINE STRAW (A) 02/09/2022 0157   APPEARANCEUR CLEAR (A) 02/09/2022 0157   LABSPEC 1.029 02/09/2022 0157   PHURINE 6.0 02/09/2022 0157   GLUCOSEU NEGATIVE 02/09/2022 0157   HGBUR NEGATIVE 02/09/2022 0157   BILIRUBINUR NEGATIVE 02/09/2022 0157   KETONESUR NEGATIVE 02/09/2022 0157   PROTEINUR NEGATIVE 02/09/2022 0157   NITRITE NEGATIVE 02/09/2022 0157    LEUKOCYTESUR NEGATIVE 02/09/2022 0157   Dr. Tobie Poet [facility]  If 7PM-7AM, please contact overnight-coverage provider If 7AM-7PM, please contact day coverage provider [email or web address]  05/20/2022, 4:54 PM

## 2022-05-20 NOTE — Assessment & Plan Note (Addendum)
Presented with junctional bradycardia followed by sinus bradycardia CurrentPatient was on Coreg and amiodarone at home, Coreg dose was recently increased to 12.5 mg twice daily.  Cardiology was consulted. Heart rate improved today after holding Coreg. -Continue with amiodarone per cardiology recommendations -Holding Coreg

## 2022-05-20 NOTE — Assessment & Plan Note (Signed)
-   CPAP nightly resumed

## 2022-05-20 NOTE — Assessment & Plan Note (Signed)
-   Pravastatin 40 mg daily resumed 

## 2022-05-20 NOTE — Assessment & Plan Note (Signed)
-   I am holding Coreg 12.5 mg p.o. twice daily, furosemide 20 mg daily, isosorbide mononitrate 30 mg daily and telmisartan 80 mg daily on admission - Hydralazine 5 mg IV every 6 hours as needed for SBP greater than 180, 4 days ordered

## 2022-05-20 NOTE — Assessment & Plan Note (Signed)
Improving, creatinine at 1.12 this morning - Presumed prerenal secondary to dehydration in setting of being outside in the heat along with concurrent use of diuresis medication -Monitor renal function -Avoid nephrotoxins

## 2022-05-20 NOTE — Assessment & Plan Note (Signed)
-   Presumptive diagnosis on admission is secondary to symptomatic bradycardia and possible dehydration

## 2022-05-20 NOTE — Assessment & Plan Note (Signed)
Initially barely positive troponin with a flat curve. Developed chest pain this morning. -Trend troponin -Continue statin

## 2022-05-20 NOTE — ED Provider Notes (Signed)
[facility] Provider Note    Event Date/Time   First MD Initiated Contact with Patient 05/20/22 1319     (approximate)   History   Chief Complaint Fall and Near Syncope   HPI  Andrea Phillips is a 87 y.o. female with past medical history of hypertension, hyperlipidemia, CAD, CHF, and atrial fibrillation on Eliquis who presents to the ED complaining of syncope.  Patient reports that just prior to arrival she was attempting to walk to the bathroom when she suddenly lost consciousness.  She does not remember having any dizziness or lightheadedness prior to the episode, states that she woke up on the ground in her hallway not knowing what happened.  She denies any recent chest pain or shortness of breath, states that she currently feels fine other than some pain in her left hip.  She does not think she hit her head, denies any headache or neck pain.  She denies any numbness or weakness in her extremities.  She states that she had 1 similar episode of syncope yesterday, but did not seek care at that time.  She denies prior issues with syncope in the past.     Physical Exam   Triage Vital Signs: ED Triage Vitals  Enc Vitals Group     BP 05/20/22 1319 (!) 91/54     Pulse Rate 05/20/22 1319 (!) 52     Resp 05/20/22 1319 18     Temp 05/20/22 1319 98.3 F (36.8 C)     Temp Source 05/20/22 1319 Oral     SpO2 05/20/22 1319 94 %     Weight 05/20/22 1325 151 lb (68.5 kg)     Height 05/20/22 1325 5\' 1"  (1.549 m)     Head Circumference --      Peak Flow --      Pain Score 05/20/22 1323 5     Pain Loc --      Pain Edu? --      Excl. in GC? --     Most recent vital signs: Vitals:   05/20/22 1319 05/20/22 1341  BP: (!) 91/54 (!) 111/39  Pulse: (!) 52 (!) 52  Resp: 18 16  Temp: 98.3 F (36.8 C)   SpO2: 94% 95%    Constitutional: Alert and oriented. Eyes: Conjunctivae are normal. Head: Atraumatic. Nose: No congestion/rhinnorhea. Mouth/Throat: Mucous  membranes are moist.  Cardiovascular: Bradycardic, irregularly irregular rhythm. Grossly normal heart sounds.  2+ radial and DP pulses bilaterally. Respiratory: Normal respiratory effort.  No retractions. Lungs CTAB. Gastrointestinal: Soft and nontender. No distention. Musculoskeletal: No lower extremity tenderness nor edema.  Diffuse tenderness to palpation at left hip with no obvious deformity.  No tenderness to palpation noted at right hip, bilateral knees, or bilateral ankles.  No upper extremity bony tenderness to palpation noted. Neurologic:  Normal speech and language. No gross focal neurologic deficits are appreciated.    ED Results / Procedures / Treatments   Labs (all labs ordered are listed, but only abnormal results are displayed) Labs Reviewed  BASIC METABOLIC PANEL - Abnormal; Notable for the following components:      Result Value   Potassium 3.0 (*)    Glucose, Bld 118 (*)    Creatinine, Ser 1.51 (*)    Calcium 8.8 (*)    GFR, Estimated 34 (*)    All other components within normal limits  CBC - Abnormal; Notable for the following components:   RBC 3.86 (*)    Hemoglobin  11.5 (*)    HCT 35.1 (*)    All other components within normal limits  TROPONIN I (HIGH SENSITIVITY) - Abnormal; Notable for the following components:   Troponin I (High Sensitivity) 22 (*)    All other components within normal limits  MAGNESIUM  TROPONIN I (HIGH SENSITIVITY)     EKG  ED ECG REPORT I, Chesley Noon, the attending physician, personally viewed and interpreted this ECG.   Date: 05/20/2022  EKG Time: 13:21  Rate: 42  Rhythm: occasional PVC noted, unifocal, Junctional rhythm  Axis: Normal  Intervals:none  ST&T Change: Nonspecific T wave changes  RADIOLOGY CT head reviewed and interpreted by me with no hemorrhage or midline shift.  CT cervical spine reviewed and interpreted by me with no fracture or dislocation.  PROCEDURES:  Critical Care performed:  No  Procedures   MEDICATIONS ORDERED IN ED: Medications  sodium chloride 0.9 % bolus 500 mL (has no administration in time range)  potassium chloride SA (KLOR-CON M) CR tablet 40 mEq (has no administration in time range)     IMPRESSION / MDM / ASSESSMENT AND PLAN / ED COURSE  I reviewed the triage vital signs and the nursing notes.                              87 y.o. female with past medical history of hypertension, hyperlipidemia, CAD, CHF, and atrial fibrillation on Eliquis who presents to the ED complaining of syncopal episodes occurring yesterday and today without prodromal symptoms.  Patient's presentation is most consistent with acute presentation with potential threat to life or bodily function.  Differential diagnosis includes, but is not limited to, arrhythmia, ACS, PE, electrolyte abnormality, AKI, dehydration, vasovagal episodes, medication effect.  Patient well-appearing and in no acute distress, vital signs remarkable for bradycardia as well as borderline low blood pressure.  EKG appears consistent with junctional rhythm with occasional PVCs, no ischemic changes noted.  Amiodarone may be contributing to bradycardia, niece is also now at bedside and states her PCP recently increased dose of carvedilol.  Case discussed with Dr. Darrold Junker of cardiology, we will hold amiodarone and carvedilol, anticipate admission for further syncope work-up.  Plan to check labs as well as CT imaging of her head and neck, x-rays of left hip.  CT head and cervical spine are negative for acute process, x-rays of left hip do show questionable intertrochanteric fracture, however clinical suspicion for fracture is low at this time as patient has been able to range her hip.  We will check CT of left hip, results pending at this time.  Labs remarkable for mild AKI and hypokalemia, potassium was repleted.  Magnesium within normal limits, troponin mildly elevated but similar to prior baseline, no  significant anemia or leukocytosis noted at this time.  Patient appears to be in sinus bradycardia at this time, case discussed with hospitalist for admission for further syncope work-up.      FINAL CLINICAL IMPRESSION(S) / ED DIAGNOSES   Final diagnoses:  Syncope, unspecified syncope type  Junctional bradycardia     Rx / DC Orders   ED Discharge Orders     None        Note:  This document was prepared using Dragon voice recognition software and may include unintentional dictation errors.   Chesley Noon, MD 05/20/22 [phone]

## 2022-05-20 NOTE — Consult Note (Signed)
[facility] CLINIC CARDIOLOGY CONSULT NOTE       Patient ID: Andrea Phillips MRN: [id number] DOB/AGE: [birth year] 87 y.o.  Admit date: 05/20/2022 Referring Physician Dr. Chesley Noon  Primary Physician Dr. Yves Dill Primary Cardiologist Dr. 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 She was hypotensive and found to be in a junctional rhythm on presentation. Cardiology is consulted for further assistance.   She presents with her niece who contributes to the history.  She says on Monday 7/24 her PCP increased the dose of her carvedilol from 6.25 mg twice daily to 12.5 mg for better blood pressure control.  She has been compliant with her other home meds which include amiodarone 200 mg once daily and Eliquis 5 mg twice daily in addition to her other antihypertensives.  She has been overall feeling and doing well with good appetite and fluid intake, although reports continued problems with her short-term memory since her CVA in April of this year.  She says yesterday passed out and fell without warning, ending up on the floor before regaining consciousness.  This afternoon she was walking to the bathroom using a walker in her home when she passed out and fell again but thinks she felt her vision blurred somewhat prior to this episode.  She admits to pain in her hip, but otherwise denies chest pain, shortness of breath, palpitations, lower extremity edema.  Vitals are in admission are notable for blood  pressure of 91/54, improving somewhat during interview to the 160s systolic.  Heart rate in the low 50s and sinus bradycardia on telemetry.  Labs are notable for a potassium of 3.0, AKI with creatinine 1.51 and GFR 34 (baseline from June was 0.81 greater than 60.  Magnesium 2.3.  High-sensitivity troponin minimally elevated and flat trending at 21-22.  H&H decreased at 11.5/35.1 platelets 208 compared to 1 month ago at 13/40.  CT head, C-spine, and hip x-ray pending.  Review of systems complete and found to be negative unless listed above     Past Medical History:  Diagnosis Date   Arthritis    ra   Asthma    CHF (congestive heart failure) (HCC)    Dysrhythmia    Edema extremities    GERD (gastroesophageal reflux disease)    Gout    Headache    History of hiatal hernia    Hypertension    Hypothyroidism    Shortness of breath dyspnea    Sleep apnea     Past Surgical History:  Procedure Laterality Date   APPENDECTOMY     BACK SURGERY     CARDIAC CATHETERIZATION     CATARACT EXTRACTION W/PHACO Left 03/09/2015   Procedure: CATARACT EXTRACTION PHACO AND INTRAOCULAR LENS PLACEMENT (IOC);  Surgeon: Sallee Lange, MD;  Location: [facility] ORS;  Service: Ophthalmology;  Laterality: Left;  [place] 01:31 AP% 26.1 CDE 43.72   CATARACT EXTRACTION W/PHACO Right 08/19/2021   Procedure: CATARACT EXTRACTION PHACO AND INTRAOCULAR LENS PLACEMENT (IOC) RIGHT;  Surgeon: Galen Manila, MD;  Location: [facility] ORS;  Service: Ophthalmology;  Laterality: Right;  12.31 1:07.0  CATARACT EXTRACTION W/PHACO Right 09/07/2021   Procedure: REMOVAL OF LENS FRAGMENTS RIGHT;  Surgeon: Galen Manila, MD;  Location: [facility];  Service: Ophthalmology;  Laterality: Right;   CHOLECYSTECTOMY     CORONARY/GRAFT ACUTE MI REVASCULARIZATION N/A 10/24/2021   Procedure: Coronary/Graft Acute MI Revascularization;  Surgeon: Yvonne Kendall, MD;  Location: [facility] INVASIVE CV [facility];  Service: Cardiovascular;  Laterality:  N/A;   EYE SURGERY     retina   JOINT REPLACEMENT     left knee   LEFT HEART CATH AND CORONARY ANGIOGRAPHY N/A 04/02/2021   Procedure: LEFT HEART CATH AND CORONARY ANGIOGRAPHY;  Surgeon: Lamar Blinks, MD;  Location: [facility] INVASIVE CV LAB;  Service: Cardiovascular;  Laterality: N/A;   LEFT HEART CATH AND CORONARY ANGIOGRAPHY N/A 10/24/2021   Procedure: LEFT HEART CATH AND CORONARY ANGIOGRAPHY;  Surgeon: Yvonne Kendall, MD;  Location: [facility] INVASIVE CV LAB;  Service: Cardiovascular;  Laterality: N/A;    (Not in a hospital admission)  Social History   Socioeconomic History   Marital status: Widowed    Spouse name: Not on file   Number of children: Not on file   Years of education: Not on file   Highest education level: Not on file  Occupational History   Not on file  Tobacco Use   Smoking status: Never   Smokeless tobacco: Never  Substance and Sexual Activity   Alcohol use: No   Drug use: No   Sexual activity: Not on file  Other Topics Concern   Not on file  Social History Narrative   Not on file   Social Determinants of Health   Financial Resource Strain: Not on file  Food Insecurity: Not on file  Transportation Needs: Not on file  Physical Activity: Not on file  Stress: Not on file  Social Connections: Not on file  Intimate Partner Violence: Not on file    Family History  Problem Relation Age of Onset   Heart disease Mother    Heart disease Father    Breast cancer Cousin       PHYSICAL EXAM General: Elderly appearing Caucasian female, well nourished, in no acute distress.  Laying flat in ED stretcher with niece at bedside HEENT:  Normocephalic and atraumatic. Neck:  No JVD.  Lungs: Normal respiratory effort on oxygen by nasal cannula. Clear bilaterally to auscultation. No wheezes, crackles, rhonchi.  Heart: Bradycardic but regular. Normal S1 and S2 without gallops or murmurs. Radial & DP pulses 2+ bilaterally. Abdomen: Obese appearing.  Msk: Normal strength  and tone for age. Extremities: Warm and well perfused. No clubbing, cyanosis.  No peripheral edema.  Neuro: Alert and oriented X 3.  No obvious focal deficit. Psych:  Answers questions appropriately.   Labs:   Lab Results  Component Value Date   WBC 6.4 05/20/2022   HGB 11.5 (L) 05/20/2022   HCT 35.1 (L) 05/20/2022   MCV 90.9 05/20/2022   PLT 208 05/20/2022   No results for input(s): "NA", "K", "CL", "CO2", "BUN", "CREATININE", "CALCIUM", "PROT", "BILITOT", "ALKPHOS", "ALT", "AST", "GLUCOSE" in the last 168 hours.  Invalid input(s): "LABALBU" Lab Results  Component Value Date   CKTOTAL 113 04/02/2021    Lab Results  Component Value Date   CHOL 171 02/09/2022   CHOL 185 10/25/2021   CHOL 170 04/02/2021   Lab Results  Component Value Date   HDL 53 02/09/2022   HDL 49 10/25/2021   HDL 46 04/02/2021   Lab Results  Component Value Date  LDLCALC 94 02/09/2022   LDLCALC 116 (H) 10/25/2021   LDLCALC 112 (H) 04/02/2021   Lab Results  Component Value Date   TRIG 121 02/09/2022   TRIG 99 10/25/2021   TRIG 60 04/02/2021   Lab Results  Component Value Date   CHOLHDL 3.2 02/09/2022   CHOLHDL 3.8 10/25/2021   CHOLHDL 3.7 04/02/2021   No results found for: "LDLDIRECT"    Radiology: No results found.  ECHO 02/09/2022 1. Mild inferior hypo.   2. Left ventricular ejection fraction, by estimation, is 40 to 45%. The  left ventricle has mildly decreased function. The left ventricle  demonstrates regional wall motion abnormalities (see scoring  diagram/findings for description). Left ventricular  diastolic parameters are consistent with Grade II diastolic dysfunction  (pseudonormalization).   3. Right ventricular systolic function is normal. The right ventricular  size is normal.   4. The mitral valve is normal in structure. No evidence of mitral valve  regurgitation.   5. The aortic valve is normal in structure. Aortic valve regurgitation is  not visualized. Aortic  valve sclerosis is present, with no evidence of  aortic valve stenosis.   FINDINGS   Left Ventricle: Left ventricular ejection fraction, by estimation, is 40  to 45%. The left ventricle has mildly decreased function. The left  ventricle demonstrates regional wall motion abnormalities. The left  ventricular internal cavity size was normal  in size. There is no left ventricular hypertrophy. Left ventricular  diastolic parameters are consistent with Grade II diastolic dysfunction  (pseudonormalization).   Right Ventricle: The right ventricular size is normal. No increase in  right ventricular wall thickness. Right ventricular systolic function is  normal.   Left Atrium: Left atrial size was normal in size.   Right Atrium: Right atrial size was normal in size.   Pericardium: There is no evidence of pericardial effusion.   Mitral Valve: The mitral valve is normal in structure. No evidence of  mitral valve regurgitation. MV peak gradient, 5.0 mmHg. The mean mitral  valve gradient is 2.0 mmHg.   Tricuspid Valve: The tricuspid valve is normal in structure. Tricuspid  valve regurgitation is trivial.   Aortic Valve: The aortic valve is normal in structure. Aortic valve  regurgitation is not visualized. Aortic valve sclerosis is present, with  no evidence of aortic valve stenosis. Aortic valve mean gradient measures  4.7 mmHg. Aortic valve peak gradient  measures 8.8 mmHg. Aortic valve area, by VTI measures 1.14 cm.   Pulmonic Valve: The pulmonic valve was normal in structure. Pulmonic valve  regurgitation is not visualized.   Aorta: The ascending aorta was not well visualized.   IAS/Shunts: No atrial level shunt detected by color flow Doppler.   Additional Comments: Mild inferior hypo.      LEFT VENTRICLE  PLAX 2D  LVIDd:         4.25 cm   Diastology  LVIDs:         3.30 cm   LV e' medial:    5.77 cm/s  LV PW:         0.85 cm   LV E/e' medial:  18.4  LV IVS:        0.85 cm    LV e' lateral:   5.98 cm/s  LVOT diam:     2.00 cm   LV E/e' lateral: 17.7  LV SV:         41  LV SV Index:   23  LVOT Area:  3.14 cm      RIGHT VENTRICLE  RV Basal diam:  3.10 cm  RV S prime:     11.70 cm/s  TAPSE (M-mode): 2.3 cm   LEFT ATRIUM              Index        RIGHT ATRIUM           Index  LA diam:        4.20 cm  2.31 cm/m   RA Area:     21.00 cm  LA Vol (A2C):   103.0 ml 56.74 ml/m  RA Volume:   62.40 ml  34.37 ml/m  LA Vol (A4C):   102.0 ml 56.19 ml/m  LA Biplane Vol: 104.0 ml 57.29 ml/m   AORTIC VALVE                     PULMONIC VALVE  AV Area (Vmax):    1.19 cm      PV Vmax:        0.68 m/s  AV Area (Vmean):   1.19 cm      PV Vmean:       46.450 cm/s  AV Area (VTI):     1.14 cm      PV VTI:         0.151 m  AV Vmax:           148.33 cm/s   PV Peak grad:   1.9 mmHg  AV Vmean:          103.667 cm/s  PV Mean grad:   1.0 mmHg  AV VTI:            0.360 m       RVOT Peak grad: 2 mmHg  AV Peak Grad:      8.8 mmHg  AV Mean Grad:      4.7 mmHg  LVOT Vmax:         56.10 cm/s  LVOT Vmean:        39.300 cm/s  LVOT VTI:          0.131 m  LVOT/AV VTI ratio: 0.36     AORTA  Ao Root diam: 2.70 cm   MITRAL VALVE                TRICUSPID VALVE  MV Area (PHT): 3.83 cm     TR Peak grad:   23.4 mmHg  MV Area VTI:   1.51 cm     TR Vmax:        242.00 cm/s  MV Peak grad:  5.0 mmHg  MV Mean grad:  2.0 mmHg     SHUNTS  MV Vmax:       1.12 m/s     Systemic VTI:  0.13 m  MV Vmean:      53.8 cm/s    Systemic Diam: 2.00 cm  MV Decel Time: 198 msec     Pulmonic VTI:  0.189 m  MV E velocity: 106.00 cm/s  MV A velocity: 72.80 cm/s  MV E/A ratio:  1.46   Dwayne D Callwood MD  Electronically signed by Yolonda Kida MD  Signature Date/Time: 02/09/2022/10:45:42 PM   TELEMETRY reviewed by me: Initially a junctional rhythm with rate in the 40s, improved to sinus bradycardia with rate in the low 50s  EKG reviewed by me: junctional rhythm rate 42 nonspecific ST  changes  ASSESSMENT AND PLAN:  Jillene Kriese is an 87 year old  female with a past medical history significant for CAD s/p LHC 10/24/21 with an occluded distal OM 3 (too small for PCI), 20% stenosed LAD, 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 She was hypotensive and found to be in a junctional rhythm on presentation. Cardiology is consulted for further assistance.   #Symptomatic junctional bradycardia #Paroxysmal atrial flutter/fibrillation on amiodarone and Eliquis The patient presents with 2 syncopal episodes over the past 2 days, the first of which occurred without preceding symptoms and today the patient notes she had some blurred vision prior to losing consciousness. Her PCP increased her dose of carvedilol from 6.25 mg twice daily to 12.5 mg 4 days ago, and she was in a junctional rhythm with rate 42 on presentation, improving to sinus bradycardia with rate in the 50s during her first hour in the emergency department without apparent intervention.  -Continue work-up for further evaluation of the patient's left hip pain and other issues surrounding the patient's fall -Hold carvedilol for now  -can continue amiodarone 200mg  once daily starting tomorrow -Hold Eliquis and use either Lovenox or heparin for anticoagulation while inpatient.  CHA2DS2-VASc 8 -No indication for temporary or permanent pacemaker at this time, continue to monitor on telemetry for further bradycardic episodes, or sinus pauses.  #CAD #chronic HFmrEF (LVEF 40-45%, G2 DD 01/2022) #Hypertension #AKI No chest pain or evidence of ischemic changes on EKG.  She has been off of clopidogrel per neurology recommendation after her CVA in April 2023.  She is  intolerant to aspirin and statins.  She appears euvolemic from a heart failure standpoint. -Resume home Imdur 30 mg as her blood pressure tolerates -Hold telmisartan 80 mg, Lasix 40 mg with AKI  This patient's plan of care was discussed and created with Dr. Saralyn Pilar and he is in agreement.  Signed: Tristan Schroeder , PA-C 05/20/2022, 1:57 PM [facility]

## 2022-05-21 ENCOUNTER — Observation Stay
Admit: 2022-05-21 | Discharge: 2022-05-21 | Disposition: A | Discharge status: Home / Self Care | Payer: Medicare Other | Attending: Internal Medicine | Admitting: Internal Medicine

## 2022-05-21 ENCOUNTER — Other Ambulatory Visit: Payer: Self-pay

## 2022-05-21 DIAGNOSIS — R001 Bradycardia, unspecified: Secondary | ICD-10-CM | POA: Diagnosis not present

## 2022-05-21 LAB — ECHOCARDIOGRAM COMPLETE
AR max vel: 1.11 cm2
AV Peak grad: 15.4 mmHg
Ao pk vel: 1.96 m/s
Area-P 1/2: 2 cm2
Calc EF: 57.8 %
Height: 61 in
S' Lateral: 3.22 cm
Single Plane A2C EF: 54.9 %
Single Plane A4C EF: 60.3 %
Weight: 2416 oz

## 2022-05-21 LAB — CBC
HCT: 34.6 % — ABNORMAL LOW (ref 36.0–46.0)
Hemoglobin: 11.3 g/dL — ABNORMAL LOW (ref 12.0–15.0)
MCH: 29.5 pg (ref 26.0–34.0)
MCHC: 32.7 g/dL (ref 30.0–36.0)
MCV: 90.3 fL (ref 80.0–100.0)
Platelets: 195 10*3/uL (ref 150–400)
RBC: 3.83 MIL/uL — ABNORMAL LOW (ref 3.87–5.11)
RDW: 13.4 % (ref 11.5–15.5)
WBC: 5.9 10*3/uL (ref 4.0–10.5)
nRBC: 0 % (ref 0.0–0.2)

## 2022-05-21 LAB — TROPONIN I (HIGH SENSITIVITY)
Troponin I (High Sensitivity): 24 ng/L — ABNORMAL HIGH (ref <18)
Troponin I (High Sensitivity): 22 ng/L — ABNORMAL HIGH (ref <18)
Troponin I (High Sensitivity): 21 ng/L — ABNORMAL HIGH (ref <18)

## 2022-05-21 LAB — BASIC METABOLIC PANEL
Anion gap: 5 (ref 5–15)
BUN: 18 mg/dL (ref 8–23)
CO2: 30 mmol/L (ref 22–32)
Calcium: 9 mg/dL (ref 8.9–10.3)
Chloride: 108 mmol/L (ref 98–111)
Creatinine, Ser: 1.12 mg/dL — ABNORMAL HIGH (ref 0.44–1.00)
GFR, Estimated: 48 mL/min — ABNORMAL LOW (ref >60)
Glucose, Bld: 88 mg/dL (ref 70–99)
Potassium: 3.4 mmol/L — ABNORMAL LOW (ref 3.5–5.1)
Sodium: 143 mmol/L (ref 135–145)

## 2022-05-21 MED ORDER — SODIUM CHLORIDE 0.9 % IV SOLN: INTRAVENOUS | Status: DC

## 2022-05-21 MED ORDER — APIXABAN 5 MG PO TABS
5.0000 mg | ORAL_TABLET | Freq: Two times a day (BID) | ORAL | Status: DC
Start: 2022-05-21 — End: 2022-05-22
  Administered 2022-05-21 – 2022-05-22 (×2): 5 mg via ORAL
  Filled 2022-05-21 (×2): qty 1

## 2022-05-21 MED ORDER — LORAZEPAM 2 MG/ML IJ SOLN
INTRAMUSCULAR | Status: AC
  Filled 2022-05-21: qty 1

## 2022-05-21 MED ORDER — LORAZEPAM 2 MG/ML IJ SOLN
0.5000 mg | Freq: Once | INTRAMUSCULAR | Status: AC
  Administered 2022-05-21: 0.5 mg via INTRAVENOUS

## 2022-05-21 MED ORDER — SODIUM CHLORIDE 0.9 % IV BOLUS
500.0000 mL | Freq: Once | INTRAVENOUS | Status: AC
Start: 2022-05-21 — End: 2022-05-22
  Administered 2022-05-21: 500 mL via INTRAVENOUS

## 2022-05-21 MED ORDER — PERFLUTREN LIPID MICROSPHERE
1.0000 mL | INTRAVENOUS | Status: AC | PRN
  Administered 2022-05-21: 2 mL via INTRAVENOUS

## 2022-05-21 MED ORDER — NITROGLYCERIN 0.4 MG SL SUBL
0.4000 mg | SUBLINGUAL_TABLET | SUBLINGUAL | Status: DC | PRN
  Administered 2022-05-21: 0.4 mg via SUBLINGUAL
  Filled 2022-05-21: qty 1

## 2022-05-21 MED ORDER — POTASSIUM CHLORIDE CRYS ER 20 MEQ PO TBCR
40.0000 meq | EXTENDED_RELEASE_TABLET | Freq: Once | ORAL | Status: AC
  Administered 2022-05-21: 40 meq via ORAL
  Filled 2022-05-21: qty 2

## 2022-05-21 NOTE — Progress Notes (Signed)
[facility] Cardiology  SUBJECTIVE: Patient laying in bed reports doing well, denies chest pain or shortness of breath   Vitals:   05/20/22 2317 05/21/22 0001 05/21/22 0357 05/21/22 0718  BP:  (!) 154/75 134/88 (!) 130/41  Pulse:  (!) 52 (!) 51 (!) 53  Resp:  Temp: 97.9 F (36.6 C) 98.3 F (36.8 C) 98.1 F (36.7 C) 98.1 F (36.7 C)  TempSrc: Oral     SpO2:  95% 95% 91%  Weight:      Height:         Intake/Output Summary (Last 24 hours) at 05/21/2022 0900 Last data filed at 05/21/2022 1610 Gross per 24 hour  Intake 832.21 ml  Output --  Net 832.21 ml      PHYSICAL EXAM  General: Well developed, well nourished, in no acute distress HEENT:  Normocephalic and atramatic Neck:  No JVD.  Lungs: Clear bilaterally to auscultation and percussion. Heart: HRRR . Normal S1 and S2 without gallops or murmurs.  Abdomen: Bowel sounds are positive, abdomen soft and non-tender  Msk:  Back normal, normal gait. Normal strength and tone for age. Extremities: No clubbing, cyanosis or edema.   Neuro: Alert and oriented X 3. Psych:  Good affect, responds appropriately   LABS: Basic Metabolic Panel: Recent Labs    05/20/22 1327 05/20/22 1337 05/21/22 0649  NA 141  --  143  K 3.0*  --  3.4*  CL 104  --  108  CO2 28  --  30  GLUCOSE 118*  --  88  BUN 21  --  18  CREATININE 1.51*  --  1.12*  CALCIUM 8.8*  --  9.0  MG  --  2.3  --    Liver Function Tests: No results for input(s): "AST", "ALT", "ALKPHOS", "BILITOT", "PROT", "ALBUMIN" in the last 72 hours. No results for input(s): "LIPASE", "AMYLASE" in the last 72 hours. CBC: Recent Labs    05/20/22 1327 05/21/22 0649  WBC 6.4 5.9  HGB 11.5* 11.3*  HCT 35.1* 34.6*  MCV 90.9 90.3  PLT 208 195   Cardiac Enzymes: No results for input(s): "CKTOTAL", "CKMB", "CKMBINDEX", "TROPONINI" in the last 72 hours. BNP: Invalid input(s): "POCBNP" D-Dimer: No results for input(s): "DDIMER" in the last 72 hours. Hemoglobin A1C: No  results for input(s): "HGBA1C" in the last 72 hours. Fasting Lipid Panel: No results for input(s): "CHOL", "HDL", "LDLCALC", "TRIG", "CHOLHDL", "LDLDIRECT" in the last 72 hours. Thyroid Function Tests: Recent Labs    05/20/22 1539  TSH 1.071   Anemia Panel: No results for input(s): "VITAMINB12", "FOLATE", "FERRITIN", "TIBC", "IRON", "RETICCTPCT" in the last 72 hours.  EEG adult  Result Date: 05/20/2022 Charlsie Quest, MD     05/20/2022  5:58 PM Patient Name: Andrea Phillips MRN: [id number] Epilepsy Attending: Charlsie Quest Referring Physician/Provider: Lovenia Kim, DO Date: 05/20/2022 Duration: 20.21 mins Patient history: 87yo F with syncope. EEG to evaluate for seizure Level of alertness: Awake AEDs during EEG study: None Technical aspects: This EEG study was done with scalp electrodes positioned according to the 10-20 International system of electrode placement. Electrical activity was reviewed with band pass filter of 1-70Hz , sensitivity of 7 uV/mm, display speed of 10mm/sec with a  notched filter applied as appropriate. EEG data were recorded continuously and digitally stored.  Video monitoring was available and reviewed as appropriate. Description: The posterior dominant rhythm consists of 8 Hz activity of moderate voltage (25-35 uV) seen predominantly in posterior head  regions, symmetric and reactive to eye opening and eye closing. Hyperventilation and photic stimulation were not performed.   IMPRESSION: This study is within normal limits. No seizures or epileptiform discharges were seen throughout the recording. Charlsie Quest   CT Hip Left Wo Contrast  Result Date: 05/20/2022 CLINICAL DATA:  Near syncopal episode, found on the floor EXAM: CT OF THE LEFT HIP WITHOUT CONTRAST TECHNIQUE: Multidetector CT imaging of the left hip was performed according to the standard protocol. Multiplanar CT image reconstructions were also generated. RADIATION DOSE REDUCTION: This exam was  performed according to the departmental dose-optimization program which includes automated exposure control, adjustment of the mA and/or kV according to patient size and/or use of iterative reconstruction technique. COMPARISON:  None Available. FINDINGS: Bones/Joint/Cartilage No hip fracture or dislocation. Nondisplaced fracture of the S4 vertebral body. Normal alignment. No joint effusion. Mild osteoarthritis of the left hip. Ligaments Ligaments are suboptimally evaluated by CT. Muscles and Tendons Muscles are normal. No muscle atrophy. No intramuscular fluid collection or hematoma. Soft tissue No fluid collection or hematoma. No soft tissue mass. Diverticulosis without evidence of diverticulitis. IMPRESSION: 1. No acute fracture or dislocation of the left hip. 2. Acute nondisplaced fracture of the S4 vertebral body. 3. Mild osteoarthritis of the left hip. Electronically Signed   By: Elige Ko M.D.   On: 05/20/2022 15:20   DG Chest 1 View  Result Date: 05/20/2022 CLINICAL DATA:  Syncopal episode with fall 2 days ago. Asthma. Congestive heart failure. EXAM: CHEST  1 VIEW COMPARISON:  None Available. FINDINGS: The heart size and mediastinal contours are within normal limits. Both lungs are clear. The visualized skeletal structures are unremarkable. IMPRESSION: No active disease. Electronically Signed   By: Danae Orleans M.D.   On: 05/20/2022 14:19   DG Hip Unilat W or Wo Pelvis 2-3 Views Left  Result Date: 05/20/2022 CLINICAL DATA:  Fall 2 days ago.  Left hip pain. EXAM: DG HIP (WITH OR WITHOUT PELVIS) 2-3V LEFT COMPARISON:  None Available. FINDINGS: Subtle linear lucency and cortical step-off is seen in the intertrochanteric region of the left hip, suspicious for nondisplaced hip fracture. No other fractures identified. No evidence of dislocation. Mild-to-moderate left hip osteoarthritis is noted. IMPRESSION: Suspect nondisplaced intertrochanteric fracture of the left hip. Recommend further evaluation with  left hip CT. Mild to moderate left hip osteoarthritis. Electronically Signed   By: Danae Orleans M.D.   On: 05/20/2022 14:19   CT Head Wo Contrast  Result Date: 05/20/2022 CLINICAL DATA:  Neck trauma (Age >= 65y); Head trauma, minor (Age >= 65y) EXAM: CT HEAD WITHOUT CONTRAST CT CERVICAL SPINE WITHOUT CONTRAST TECHNIQUE: Multidetector CT imaging of the head and cervical spine was performed following the standard protocol without intravenous contrast. Multiplanar CT image reconstructions of the cervical spine were also generated. RADIATION DOSE REDUCTION: This exam was performed according to the departmental dose-optimization program which includes automated exposure control, adjustment of the mA and/or kV according to patient size and/or use of iterative reconstruction technique. 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 No evidence of acute infarction, hemorrhage, hydrocephalus, extra-axial collection or mass lesion/mass effect. Vascular: Atherosclerotic calcifications involving the large vessels of the skull base. No unexpected hyperdense vessel. Skull: Normal. Negative for fracture or focal lesion. Sinuses/Orbits: Mucosal thickening within the left maxillary sinus. Other: None. CT CERVICAL SPINE FINDINGS Alignment: Facet joints are aligned without dislocation or traumatic listhesis. Dens and lateral masses are aligned.  Straightening of the cervical lordosis. Skull base and vertebrae: No acute fracture. Unchanged degenerative subchondral cyst within the odontoid process. Bulky anterior endplate osteophytes throughout the mid to lower cervical spine. No suspicious lytic or sclerotic bone lesion. Soft tissues and spinal canal: No prevertebral fluid or swelling. No visible canal hematoma. Disc levels: Multilevel cervical spondylosis, not appreciably progressed from prior. Upper chest: No acute findings. Other: None.  IMPRESSION: 1. No acute intracranial abnormality. 2. 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 3. No acute cervical spine fracture or subluxation. 4. Multilevel cervical spondylosis, not appreciably progressed from prior. Electronically Signed   By: Duanne Guess D.O.   On: 05/20/2022 14:10   CT Cervical Spine Wo Contrast  Result Date: 05/20/2022 CLINICAL DATA:  Neck trauma (Age >= 65y); Head trauma, minor (Age >= 65y) EXAM: CT HEAD WITHOUT CONTRAST CT CERVICAL SPINE WITHOUT CONTRAST TECHNIQUE: Multidetector CT imaging of the head and cervical spine was performed following the standard protocol without intravenous contrast. Multiplanar CT image reconstructions of the cervical spine were also generated. RADIATION DOSE REDUCTION: This exam was performed according to the departmental dose-optimization program which includes automated exposure control, adjustment of the mA and/or kV according to patient size and/or use of iterative reconstruction technique. 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 No evidence of acute infarction, hemorrhage, hydrocephalus, extra-axial collection or mass lesion/mass effect. Vascular: Atherosclerotic calcifications involving the large vessels of the skull base. No unexpected hyperdense vessel. Skull: Normal. Negative for fracture or focal lesion. Sinuses/Orbits: Mucosal thickening within the left maxillary sinus. Other: None. CT CERVICAL SPINE FINDINGS Alignment: Facet joints are aligned without dislocation or traumatic listhesis. Dens and lateral masses are aligned. Straightening of the cervical lordosis. Skull base and vertebrae: No acute fracture. Unchanged degenerative subchondral cyst within the odontoid process. Bulky anterior endplate osteophytes throughout the mid to lower cervical spine. No suspicious lytic or sclerotic bone lesion. Soft tissues and spinal  canal: No prevertebral fluid or swelling. No visible canal hematoma. Disc levels: Multilevel cervical spondylosis, not appreciably progressed from prior. Upper chest: No acute findings. Other: None. IMPRESSION: 1. No acute intracranial abnormality. 2. 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 3. No acute cervical spine fracture or subluxation. 4. Multilevel cervical spondylosis, not appreciably progressed from prior. Electronically Signed   By: Duanne Guess D.O.   On: 05/20/2022 14:10     Echo EF 40-45% 02/09/2022  TELEMETRY: Sinus bradycardia 53 bpm:  ASSESSMENT AND PLAN:  Principal Problem:   Symptomatic bradycardia Active Problems:   Essential hypertension   Syncope and collapse   Frequent PVCs   OSA (obstructive sleep apnea)   Elevated troponin   Hypothyroidism   Hyperlipidemia, mixed   AKI (acute kidney injury) (HCC)   Coronary artery disease involving native coronary artery of native heart without angina pectoris   Sleep apnea with use of continuous positive airway pressure (CPAP)   Atrial fibrillation and flutter (HCC)   Hypokalemia    1.  Syncope, likely secondary to junctional bradycardia, due to recent up titration of carvedilol from 3.125 to 6.25 mg twice daily.  EEG negative. 2.  Junctional bradycardia, after recent increase in carvedilol dose from 6.25 to 12.5 mg twice daily, resolved, currently in sinus bradycardia at 53 bpm, asymptomatic 3.  Paroxysmal atrial fibrillation, on Eliquis for stroke prevention, and amiodarone for rate and rhythm control, currently in sinus bradycardia 4.  Coronary artery disease, occluded distal  OM3  10/24/2021, treated medically, no chest pain 5.  Mild ischemic cardiomyopathy, LVEF 40-45% 02/09/2022 6.  Essential hypertension, blood pressure well controlled, on hydralazine  Recommendations  1.  Agree with current therapy 2.  Hold carvedilol at this time 3.  Continue Eliquis for stroke prevention 4.  Ambulate  patient today, if patient does well consider discharge and follow-up with Dr. Gwen Pounds in 1 to 2 weeks     Marcina Millard, MD, PhD, [facility] 05/21/2022 9:00 AM

## 2022-05-21 NOTE — Progress Notes (Signed)
Patient's vitals below on arrival to the floor.   05/21/22 0001  Vitals  Temp 98.3 F (36.8 C)  BP (!) 154/75  MAP (mmHg) 98  BP Location Right Arm  BP Method Automatic  Patient Position (if appropriate) Lying  Pulse Rate (!) 52  Resp 18  MEWS COLOR  MEWS Score Color Green  Oxygen Therapy  SpO2 95 %  O2 Device Room Air  MEWS Score  MEWS Temp 0  MEWS Systolic 0  MEWS Pulse 0  MEWS RR 0  MEWS LOC 0  MEWS Score 0

## 2022-05-21 NOTE — Progress Notes (Signed)
Patient arrives floor at 11:45 pm. Patient alert and oriented by 4. Calm and cooperative. She has LR going in a 20 G PIV at 100 ml/hr. This has since been discontinued.  Patient's skin on skin assessment is in tact. She has tenderness in her left hip where she had previously fallen. She states she had two falls: one earlier in the week and the one that brought her in this admission.  She has a history of A-fib. Her BP is low and she is bradycardic. NSR on telemetry. She has no shortness of breath or difficulty breathing. Denies feeling drowsy. Mentions a recent change in her BP medications.  Patient takes Synthroid this morning. Takes her pills whole with water.

## 2022-05-21 NOTE — Progress Notes (Signed)
PT Cancellation Note  Patient Details Name: Andrea Phillips MRN: [id number] DOB: [birth year]   Cancelled Treatment:    Reason Eval/Treat Not Completed: Patient not medically ready.  PT consult received.  Chart reviewed.  Nurse reports pt with chest pain and difficulty breathing--nurse recommending holding therapy at this time.  Will re-attempt PT session at a later date/time as medically appropriate.  Hendricks Limes, PT 05/21/22, 11:52 AM

## 2022-05-21 NOTE — Progress Notes (Signed)
Family notified RN of patient having sudden onset of CP, this RN to bedside to assess patient c/o 5/10 chest pain, throbbing pressure in nature. Initial VS BP 93/25, 87/46, 130/52, HR 59, 61, 79, O2 sats stable on room air 95-96% throughout  STAT EKG unremarkable, MD and RRT team notified, adult emergency standing orders implemented, serial troponins ordered and patient given 1 SL nitro, with remission of pain however patient is tearful and overwhelmed in appearance, MD given verbal order for one time dose of ativan. Family explains that patient just lost her cousin and is feeling scared and anxious, they continue to provide comfort at bedside.

## 2022-05-21 NOTE — Progress Notes (Signed)
   05/21/22 1100  Clinical Encounter Type  Visited With Patient and family together  Visit Type Code  Consult/Referral To Chaplain   Chaplain responded to RR code and provided assistance and support to family.

## 2022-05-21 NOTE — Progress Notes (Signed)
*  PRELIMINARY RESULTS* Echocardiogram 2D Echocardiogram has been performed. Definity IV ultrasound imaging agent used on this study.  Lenor Coffin 05/21/2022, 2:13 PM

## 2022-05-21 NOTE — Plan of Care (Signed)
  Problem: Education: Goal: Knowledge of General Education information will improve Description: Including pain rating scale, medication(s)/side effects and non-pharmacologic comfort measures Outcome: Progressing   Problem: Health Behavior/Discharge Planning: Goal: Ability to manage health-related needs will improve Outcome: Progressing   Problem: Clinical Measurements: Goal: Ability to maintain clinical measurements within normal limits will improve Outcome: Progressing Goal: Will remain free from infection Outcome: Progressing Goal: Diagnostic test results will improve Outcome: Progressing Goal: Respiratory complications will improve Outcome: Progressing Goal: Cardiovascular complication will be avoided Outcome: Progressing   Problem: Safety: Goal: Ability to remain free from injury will improve Outcome: Progressing   

## 2022-05-21 NOTE — Assessment & Plan Note (Signed)
Patient with ischemic cardiomyopathy, EF of 40 to 45% according to echo done in April 2023.  Appears euvolemic -Holding home Lasix for softer blood pressure. -Closely monitor volume status as patient received some IV fluid

## 2022-05-21 NOTE — Progress Notes (Signed)
Patient continues with low pressures MD aware fluid bolus given, will continue to monitor pressure and patient status along side charge nurse.   05/21/22 1223  Assess: MEWS Score  BP (!) 84/25  MAP (mmHg) (!) 43 (MD notified)  Pulse Rate (!) 49  Resp 19  SpO2 95 %  O2 Device Nasal Cannula  O2 Flow Rate (L/min) 1 L/min  Assess: MEWS Score  MEWS Temp 0  MEWS Systolic 1  MEWS Pulse 1  MEWS RR 0  MEWS LOC 0  MEWS Score 2  MEWS Score Color Yellow  Assess: if the MEWS score is Yellow or Red  Were vital signs taken at a resting state? Yes  Focused Assessment No change from prior assessment  Does the patient meet 2 or more of the SIRS criteria? No  MEWS guidelines implemented *See Row Information* Yes  Treat  MEWS Interventions Other (Comment) (administered IV fluids)  Pain Scale 0-10  Pain Score 0  Take Vital Signs  Increase Vital Sign Frequency  Yellow: Q 2hr X 2 then Q 4hr X 2, if remains yellow, continue Q 4hrs  Escalate  MEWS: Escalate Yellow: discuss with charge nurse/RN and consider discussing with provider and RRT  Notify: Charge Nurse/RN  Name of Charge Nurse/RN Notified Ashly J  Date Charge Nurse/RN Notified 05/21/22  Time Charge Nurse/RN Notified 1115  Notify: Provider  Provider Name/Title Dr. Nelson Chimes  Date Provider Notified 05/21/22  Time Provider Notified 1227  Method of Notification Page  Notification Reason Change in status  Provider response At bedside  Date of Provider Response 05/21/22  Time of Provider Response 1125  Notify: Rapid Response  Name of Rapid Response RN Notified Katie  Date Rapid Response Notified 05/21/22  Time Rapid Response Notified 1125  Document  Patient Outcome Stabilized after interventions  Progress note created (see row info) Yes  Assess: SIRS CRITERIA  SIRS Temperature  0  SIRS Pulse 0  SIRS Respirations  0  SIRS WBC 1  SIRS Score Sum  1

## 2022-05-21 NOTE — Progress Notes (Signed)
OT Cancellation Note  Patient Details Name: Andrea Phillips MRN: [id number] DOB: September 05, 1935   Cancelled Treatment:    Reason Eval/Treat Not Completed: Patient not medically ready;Other (comment) (Per chart and brief discussion with RN, pt with chest pain, RRT present to assess. Pt is not approrpiate for OT evaluation at this time. OT will re-attempt when pt is medically ready/appropriate) Oleta Mouse, OTD OTR/L  05/21/22, 12:18 PM

## 2022-05-21 NOTE — Progress Notes (Signed)
Progress Note   Patient: Andrea Phillips RZN:[id number] DOB: Nov 17, 1934 DOA: 05/20/2022     0 DOS: the patient was seen and examined on 05/21/2022   Brief hospital course: Ms. Andrea Phillips is an 87 year old female with history of hypothyroid, atrial fibrillation on Eliquis, hypertension, hyperlipidemia, CAD, history of left PCA territory infarct, who presents emergency department for chief concerns of falling and syncope.  She reports that she was shelling peas on the porch and was getting up to use the bathroom to have a bowel movement when she must have passed out.  The next thing she knew she was laying on the floor.  Her Andrea Phillips found her and called EMS.   At bedside she denies chest pain, shortness of breath, dysphagia, fever, new cough, dysuria, hematuria.   She endorses soiling herself, bowel incontinence during the syncope episode.  Patient does not know the duration but states that she was not out for very long.   She reports that within the last 2 weeks she has had 2 episodes of passing out, the first being in her backyard.  Initial vitals in the emergency department showed temperature of 98.3, respiration rate of 16, heart rate of 52, blood pressure 111/39, SPO2 of 95% on room air.  Serum sodium is 141, potassium 3.0, chloride 104, bicarb 22, BUN of 21, serum creatinine 1.51, GFR 34, WBC 6.4, hemoglobin 11.5, platelets of 208.  High sensitive troponin is 22>>25  ED treatment: Potassium chloride 40 mEq p.o. one-time dose, sodium chloride 500 mL bolus  7/29; patient initially presented with junctional bradycardia which improved to sinus bradycardia during the first hour in the ED without any intervention..  Her carvedilol dose was recently increased to 12.5 mg twice daily few days ago.  Had 2 syncopal episodes in the past 2 to 3 days.  EEG negative for any seizure-like activity. Cardiology is recommending continuation of amiodarone and holding Coreg. CT hip was done  with concern of left hip pain secondary to fall, it was negative for any acute fractures or dislocations.  Also noted to have acute nondisplaced fracture of S4 and mild osteoarthritis of left hip. PT/OT evaluation.  Patient developed sudden onset chest pain and presyncope requiring rapid response call this morning.  She was complaining of substernal pressure like chest pain, nonradiating, associated with shortness of breath.  She became transiently hypotensive but blood pressure improved initially without any intervention.  First set of troponin at 24 which is consistent with the prior checks, EKG without any acute abnormality, patient received nitroglycerin and 0.5 mg of Ativan for excessive anxiety.  Blood pressure again becoming little soft and she was given 500 cc of normal saline bolus.  2 nieces at bedside. According to the family patient is becoming more anxious due to the recent death of her cousin.   Assessment and Plan: * Symptomatic bradycardia Presented with junctional bradycardia followed by sinus bradycardia CurrentPatient was on Coreg and amiodarone at home, Coreg dose was recently increased to 12.5 mg twice daily.  Cardiology was consulted. Heart rate improved today after holding Coreg. -Continue with amiodarone per cardiology recommendations -Holding Coreg  Syncope and collapse - Presumptive diagnosis on admission is secondary to symptomatic bradycardia and possible dehydration  Coronary artery disease involving native coronary artery of native heart without angina pectoris Patient developed pressure-like chest pain associated with shortness of breath and appears very anxious.  Stat EKG without any acute or significant abnormality. First set of troponin was without any significant change. Received  sublingual nitroglycerin and 0.5 mg of Ativan. -Trend troponin -Echocardiogram to rule out any wall motion abnormalities  Chronic HFrEF (heart failure with reduced ejection  fraction)  with improved EF(HCC) Patient with ischemic cardiomyopathy, EF of 40 to 45% according to echo done in April 2023.  Appears euvolemic -Holding home Lasix for softer blood pressure. -Closely monitor volume status as patient received some IV fluid  Atrial fibrillation and flutter (HCC) -Continue amiodarone -Continue Eliquis -Keep holding Coreg  AKI (acute kidney injury) (HCC) Improving, creatinine at 1.12 this morning - Presumed prerenal secondary to dehydration in setting of being outside in the heat along with concurrent use of diuresis medication -Monitor renal function -Avoid nephrotoxins  Essential hypertension - Holding Coreg 12.5 mg p.o. twice daily, furosemide 20 mg daily, isosorbide mononitrate 30 mg daily and telmisartan 80 mg daily  - Hydralazine 5 mg IV every 6 hours as needed for SBP greater than 180, 4 days ordered  Elevated troponin Initially barely positive troponin with a flat curve. Developed chest pain this morning. -Trend troponin -Continue statin  Hypothyroidism - Levothyroxine 112 mcg daily resumed  Sleep apnea with use of continuous positive airway pressure (CPAP) - CPAP nightly resumed  Hypokalemia Potassium of 3.4 with magnesium of 2.3. -Replace potassium and monitor  Hyperlipidemia, mixed-resolved as of 05/21/2022 - Pravastatin 40 mg daily resumed   Subjective: Patient was complaining of substernal pressure-like chest pain, nonradiating but associated with a lot of anxiety and shortness of breath.  Physical Exam: Vitals:   05/21/22 0718 05/21/22 1123 05/21/22 1129 05/21/22 1223  BP: (!) 130/41 (!) 93/25 (!) 130/52 (!) 84/25  Pulse: (!) 53  79 (!) 49  Resp: 19 16 18 19   Temp: 98.1 F (36.7 C)     TempSrc:      SpO2: 91% 95% 96% 95%  Weight:      Height:       General.  Anxious appearing elderly lady, in no acute distress. Pulmonary.  Lungs clear bilaterally, normal respiratory effort. CV.  Regular rate and rhythm, no JVD, rub or  murmur. Abdomen.  Soft, nontender, nondistended, BS positive. CNS.  Alert and oriented .  No focal neurologic deficit. Extremities.  No edema, no cyanosis, pulses intact and symmetrical. Psychiatry.  Judgment and insight appears normal.  Data Reviewed: Prior data reviewed.  Family Communication: Discussed with 2 nieces at bedside  Disposition: Status is: Observation The patient remains OBS appropriate and will d/c before 2 midnights.  Planned Discharge Destination: Home  DVT prophylaxis.  Eliquis Time spent: 50 minutes  This record has been created using . Errors have been sought and corrected,but may not always be located. Such creation errors do not reflect on the standard of care.  Author: Conservation officer, historic buildings, MD 05/21/2022 2:17 PM  For on call review [email or web address].

## 2022-05-21 NOTE — Progress Notes (Signed)
   05/21/22 1500  Clinical Encounter Type  Visited With Patient and family together  Visit Type Follow-up   Chaplain provided up follow up care to patient and family after code.

## 2022-05-21 NOTE — Assessment & Plan Note (Signed)
Patient developed pressure-like chest pain associated with shortness of breath and appears very anxious.  Stat EKG without any acute or significant abnormality. First set of troponin was without any significant change. Received sublingual nitroglycerin and 0.5 mg of Ativan. -Trend troponin -Echocardiogram to rule out any wall motion abnormalities

## 2022-05-21 NOTE — Significant Event (Signed)
Rapid Response Event Note   Reason for Call : called to 247 for pt with chest pain, low bp reported.   Initial Focused Assessment: pt laying in bed, tearful, c/o 5/10 chest pain, VSS otherwise...      Interventions: Dr Nelson Chimes to bedside, EKG done, ECHO ordered, Nitro given... Dr Nelson Chimes to reach out to cardiology.   Plan of Care: As above, RN's Ashly and Ron to call for further assistance.    Event Summary: As above  MD Notified: 1127 Dr Nelson Chimes Call [id number] Arrival Time:1130 End Time:1150  Rolan Wrightsman A, RN

## 2022-05-22 DIAGNOSIS — R001 Bradycardia, unspecified: Secondary | ICD-10-CM | POA: Diagnosis not present

## 2022-05-22 NOTE — Plan of Care (Signed)

## 2022-05-22 NOTE — Discharge Summary (Signed)
Physician Discharge Summary   Patient: Andrea Phillips MRN: [id number] DOB: 09-27-1935  Admit date:     05/20/2022  Discharge date: 05/22/22  Discharge Physician: Arnetha Courser   PCP: Margaretann Loveless, MD   Recommendations at discharge:  Follow-up with cardiology within next few days to discuss the use of amiodarone and medication adjustment for concern of symptomatic bradycardia. Carvedilol has been discontinued. Follow-up with primary care provider.  Discharge Diagnoses: Principal Problem:   Symptomatic bradycardia Active Problems:   Syncope and collapse   Chronic HFrEF (heart failure with reduced ejection fraction)  with improved EF(HCC)   Coronary artery disease involving native coronary artery of native heart without angina pectoris   Atrial fibrillation and flutter (HCC)   AKI (acute kidney injury) (HCC)   Essential hypertension   Elevated troponin   Hypothyroidism   Sleep apnea with use of continuous positive airway pressure (CPAP)   Hypokalemia   Frequent PVCs  Resolved Problems:   OSA (obstructive sleep apnea)   Hyperlipidemia, mixed  Hospital Course: Ms. Andrea Phillips is an 87 year old female with history of hypothyroid, atrial fibrillation on Eliquis, hypertension, hyperlipidemia, CAD, history of left PCA territory infarct, who presents emergency department for chief concerns of falling and syncope.  She reports that she was shelling peas on the porch and was getting up to use the bathroom to have a bowel movement when she must have passed out.  The next thing she knew she was laying on the floor.  Her great grandson found her and called EMS.   At bedside she denies chest pain, shortness of breath, dysphagia, fever, new cough, dysuria, hematuria.   She endorses soiling herself, bowel incontinence during the syncope episode.  Patient does not know the duration but states that she was not out for very long.   She reports that within the last 2 weeks she has had  2 episodes of passing out, the first being in her backyard.  Initial vitals in the emergency department showed temperature of 98.3, respiration rate of 16, heart rate of 52, blood pressure 111/39, SPO2 of 95% on room air.  Serum sodium is 141, potassium 3.0, chloride 104, bicarb 22, BUN of 21, serum creatinine 1.51, GFR 34, WBC 6.4, hemoglobin 11.5, platelets of 208.  High sensitive troponin is 22>>25  ED treatment: Potassium chloride 40 mEq p.o. one-time dose, sodium chloride 500 mL bolus  7/29; patient initially presented with junctional bradycardia which improved to sinus bradycardia during the first hour in the ED without any intervention..  Her carvedilol dose was recently increased to 12.5 mg twice daily few days ago.  Had 2 syncopal episodes in the past 2 to 3 days.  EEG negative for any seizure-like activity. Cardiology is recommending continuation of amiodarone and holding Coreg. CT hip was done with concern of left hip pain secondary to fall, it was negative for any acute fractures or dislocations.  Also noted to have acute nondisplaced fracture of S4 and mild osteoarthritis of left hip. PT/OT evaluation.  Patient developed sudden onset chest pain and presyncope requiring rapid response call this morning.  She was complaining of substernal pressure like chest pain, nonradiating, associated with shortness of breath.  She became transiently hypotensive but blood pressure improved initially without any intervention.  First set of troponin at 24 which is consistent with the prior checks, EKG without any acute abnormality, patient received nitroglycerin and 0.5 mg of Ativan for excessive anxiety.  Blood pressure again becoming little soft and she was  given 500 cc of normal saline bolus.  2 nieces at bedside. According to the family patient is becoming more anxious due to the recent death of her cousin.  2023-06-14: Clinically stable.  Troponin remained barely positive with a flat curve.   Echocardiogram with some improvement of EF to 50 to 55%, no regional wall motion abnormalities and normal diastolic function.  Discussed with cardiology and they would like to continue amiodarone, heart rate mostly in low 50s.  Patient will follow-up with Dr. Gwen Pounds, her cardiologist for the next few days to review her medications.  We stopped hydralazine and carvedilol.  She will continue her ARB, amiodarone and Imdur.  She will continue the rest of her home medications and follow-up with her providers.  Assessment and Plan: * Symptomatic bradycardia Presented with junctional bradycardia followed by sinus bradycardia CurrentPatient was on Coreg and amiodarone at home, Coreg dose was recently increased to 12.5 mg twice daily.  Cardiology was consulted. Heart rate improved today after holding Coreg. -Continue with amiodarone per cardiology recommendations -Holding Coreg  Syncope and collapse - Presumptive diagnosis on admission is secondary to symptomatic bradycardia and possible dehydration  Coronary artery disease involving native coronary artery of native heart without angina pectoris Patient developed pressure-like chest pain associated with shortness of breath and appears very anxious.  Stat EKG without any acute or significant abnormality. First set of troponin was without any significant change. Received sublingual nitroglycerin and 0.5 mg of Ativan. -Trend troponin -Echocardiogram to rule out any wall motion abnormalities  Chronic HFrEF (heart failure with reduced ejection fraction)  with improved EF(HCC) Patient with ischemic cardiomyopathy, EF of 40 to 45% according to echo done in April 2023.  Appears euvolemic -Holding home Lasix for softer blood pressure. -Closely monitor volume status as patient received some IV fluid  Atrial fibrillation and flutter (HCC) -Continue amiodarone -Continue Eliquis -Keep holding Coreg  AKI (acute kidney injury) (HCC) Improving, creatinine  at 1.12 this morning - Presumed prerenal secondary to dehydration in setting of being outside in the heat along with concurrent use of diuresis medication -Monitor renal function -Avoid nephrotoxins  Essential hypertension - Holding Coreg 12.5 mg p.o. twice daily, furosemide 20 mg daily, isosorbide mononitrate 30 mg daily and telmisartan 80 mg daily  - Hydralazine 5 mg IV every 6 hours as needed for SBP greater than 180, 4 days ordered  Elevated troponin Initially barely positive troponin with a flat curve. Developed chest pain this morning. -Trend troponin -Continue statin  Hypothyroidism - Levothyroxine 112 mcg daily resumed  Sleep apnea with use of continuous positive airway pressure (CPAP) - CPAP nightly resumed  Hypokalemia Potassium of 3.4 with magnesium of 2.3. -Replace potassium and monitor  Hyperlipidemia, mixed-resolved as of 05/21/2022 - Pravastatin 40 mg daily resumed   Consultants: Cardiology Procedures performed: None Disposition: Home Diet recommendation:  Discharge Diet Orders (From admission, onward)     Start     Ordered   06-13-22 0000  Diet - low sodium heart healthy        06-13-2022 1011           Cardiac diet DISCHARGE MEDICATION: Allergies as of 06-13-2022       Reactions   Aspirin Hives   Other Hives   Yellow dye 6 FOOD COLOR YELLOW POWDER   Atorvastatin Other (See Comments)   unknown   Codeine Other (See Comments)   Doesn't know   Conj Estrog-medroxyprogest Ace Other (See Comments)   PREMPRO 0.3-1.5 MG ORAL TABLET unknown  Prednisone Other (See Comments)   Chest pain   Raloxifene Other (See Comments)   EVISTA 60 MG ORAL TABLET unknown   Ace Inhibitors Cough   Amlodipine Itching   Valsartan Itching        Medication List     STOP taking these medications    carvedilol 12.5 MG tablet Commonly known as: COREG   hydrALAZINE 50 MG tablet Commonly known as: APRESOLINE       TAKE these medications    albuterol 108  (90 Base) MCG/ACT inhaler Commonly known as: VENTOLIN HFA Inhale 2 puffs into the lungs every 6 (six) hours as needed for wheezing or shortness of breath.   amiodarone 200 MG tablet Commonly known as: PACERONE Take 1 tablet (200 mg total) by mouth daily.   apixaban 5 MG Tabs tablet Commonly known as: ELIQUIS Take 1 tablet (5 mg total) by mouth 2 (two) times daily.   cetirizine 10 MG tablet Commonly known as: ZYRTEC Take 10 mg by mouth daily.   Dulera 200-5 MCG/ACT Aero Generic drug: mometasone-formoterol Inhale 2 puffs into the lungs 2 (two) times daily.   FISH OIL ADULT GUMMIES PO Take 1 tablet by mouth daily.   fluticasone 50 MCG/ACT nasal spray Commonly known as: FLONASE Place 1 spray into both nostrils 2 (two) times daily as needed for allergies or rhinitis.   furosemide 20 MG tablet Commonly known as: LASIX Take 1 tablet (20 mg total) by mouth daily. What changed: how much to take   icosapent Ethyl 1 g capsule Commonly known as: VASCEPA Take 2 g by mouth 2 (two) times daily.   isosorbide mononitrate 30 MG 24 hr tablet Commonly known as: IMDUR Take 30 mg by mouth daily.   levothyroxine 112 MCG tablet Commonly known as: SYNTHROID Take 112 mcg by mouth daily.   magnesium oxide 400 MG tablet Commonly known as: MAG-OX Take 400 mg by mouth daily.   omeprazole 20 MG tablet Commonly known as: PRILOSEC OTC Take 20 mg by mouth daily.   ondansetron 4 MG disintegrating tablet Commonly known as: ZOFRAN-ODT Take 1 tablet (4 mg total) by mouth every 8 (eight) hours as needed for nausea or vomiting.   pravastatin 40 MG tablet Commonly known as: PRAVACHOL Take 1 tablet (40 mg total) by mouth daily at 6 PM.   telmisartan 80 MG tablet Commonly known as: MICARDIS Take 80 mg by mouth daily.   traMADol 50 MG tablet Commonly known as: ULTRAM Take 1 tablet (50 mg total) by mouth every 12 (twelve) hours as needed for moderate pain or severe pain.        Follow-up  Information     Lamar Blinks, MD Follow up in 1 week(s).   Specialty: Cardiology Contact information: [address] [facility] [place] [address] [phone]         Margaretann Loveless, MD. Schedule an appointment as soon as possible for a visit in 1 week(s).   Specialty: Internal Medicine Contact information: [address] [place] [address] [phone]                Discharge Exam: Ceasar Mons Weights   05/20/22 1325  Weight: 68.5 kg   General.     In no acute distress. Pulmonary.  Lungs clear bilaterally, normal respiratory effort. CV.  Regular rate and rhythm, no JVD, rub or murmur. Abdomen.  Soft, nontender, nondistended, BS positive. CNS.  Alert and oriented .  No focal neurologic deficit. Extremities.  No  edema, no cyanosis, pulses intact and symmetrical. Psychiatry.  Judgment and insight appears normal.   Condition at discharge: stable  The results of significant diagnostics from this hospitalization (including imaging, microbiology, ancillary and laboratory) are listed below for reference.   Imaging Studies: ECHOCARDIOGRAM COMPLETE  Result Date: 05/21/2022    ECHOCARDIOGRAM REPORT   Patient Name:   KINSLIE HOVE Date of Exam: 05/21/2022 Medical Rec #:  [id number]           Height:       61.0 in Accession #:    [id number]          Weight:       151.0 lb Date of Birth:  1934/12/08           BSA:          1.676 m Patient Age:    87 years            BP:           84/25 mmHg Patient Gender: F                   HR:           62 bpm. Exam Location:  [facility] Procedure: 2D Echo and Intracardiac Opacification Agent Indications:     Abnormal ECG R94.31  History:         Patient has prior history of Echocardiogram examinations, most                  recent 02/09/2022.  Sonographer:     Overton Mam RDCS Referring Phys:  [id number] Anala Whisenant Diagnosing Phys: Marcina Millard MD  Sonographer Comments: Technically difficult study due  to poor echo windows, suboptimal apical window and suboptimal parasternal window. Image acquisition challenging due to patient body habitus. This study was performed with the patient supine. IMPRESSIONS  1. Left ventricular ejection fraction, by estimation, is 50 to 55%. The left ventricle has low normal function. The left ventricle has no regional wall motion abnormalities. Left ventricular diastolic parameters were normal.  2. Right ventricular systolic function is normal. The right ventricular size is normal.  3. The mitral valve is normal in structure. Mild mitral valve regurgitation. No evidence of mitral stenosis.  4. The aortic valve is normal in structure. Aortic valve regurgitation is not visualized. No aortic stenosis is present.  5. The inferior vena cava is normal in size with greater than 50% respiratory variability, suggesting right atrial pressure of 3 mmHg. FINDINGS  Left Ventricle: Left ventricular ejection fraction, by estimation, is 50 to 55%. The left ventricle has low normal function. The left ventricle has no regional wall motion abnormalities. Definity contrast agent was given IV to delineate the left ventricular endocardial borders. The left ventricular internal cavity size was normal in size. There is no left ventricular hypertrophy. Left ventricular diastolic parameters were normal. Right Ventricle: The right ventricular size is normal. No increase in right ventricular wall thickness. Right ventricular systolic function is normal. Left Atrium: Left atrial size was normal in size. Right Atrium: Right atrial size was normal in size. Pericardium: There is no evidence of pericardial effusion. Mitral Valve: The mitral valve is normal in structure. Mild mitral valve regurgitation. No evidence of mitral valve stenosis. Tricuspid Valve: The tricuspid valve is normal in structure. Tricuspid valve regurgitation is mild . No evidence of tricuspid stenosis. Aortic Valve: The aortic valve is normal in  structure. Aortic valve regurgitation is not visualized. No aortic stenosis is  present. Aortic valve peak gradient measures 15.4 mmHg. Pulmonic Valve: The pulmonic valve was normal in structure. Pulmonic valve regurgitation is not visualized. No evidence of pulmonic stenosis. Aorta: The aortic root is normal in size and structure. Venous: The inferior vena cava is normal in size with greater than 50% respiratory variability, suggesting right atrial pressure of 3 mmHg. IAS/Shunts: No atrial level shunt detected by color flow Doppler.  LEFT VENTRICLE PLAX 2D LVIDd:         4.43 cm     Diastology LVIDs:         3.22 cm     LV e' medial:    6.53 cm/s LV PW:         1.11 cm     LV E/e' medial:  16.5 LV IVS:        0.89 cm     LV e' lateral:   8.16 cm/s LVOT diam:     1.80 cm     LV E/e' lateral: 13.2 LV SV:         52 LV SV Index:   31 LVOT Area:     2.54 cm  LV Volumes (MOD) LV vol d, MOD A2C: 68.8 ml LV vol d, MOD A4C: 91.5 ml LV vol s, MOD A2C: 31.0 ml LV vol s, MOD A4C: 36.3 ml LV SV MOD A2C:     37.8 ml LV SV MOD A4C:     91.5 ml LV SV MOD BP:      46.2 ml RIGHT VENTRICLE RV Basal diam:  3.42 cm RV S prime:     10.70 cm/s TAPSE (M-mode): 2.0 cm LEFT ATRIUM             Index        RIGHT ATRIUM           Index LA diam:        4.40 cm 2.63 cm/m   RA Area:     13.60 cm LA Vol (A2C):   69.2 ml 41.29 ml/m  RA Volume:   34.60 ml  20.64 ml/m LA Vol (A4C):   45.5 ml 27.15 ml/m LA Biplane Vol: 56.1 ml 33.47 ml/m  AORTIC VALVE                 PULMONIC VALVE AV Area (Vmax): 1.11 cm     PV Vmax:       0.96 m/s AV Vmax:        196.00 cm/s  PV Peak grad:  3.7 mmHg AV Peak Grad:   15.4 mmHg LVOT Vmax:      85.50 cm/s LVOT Vmean:     53.700 cm/s LVOT VTI:       0.205 m  AORTA Ao Root diam: 2.40 cm Ao Asc diam:  2.90 cm MITRAL VALVE                TRICUSPID VALVE MV Area (PHT): 2.00 cm     TV Peak grad:   32.5 mmHg MV Decel Time: 379 msec     TV Vmax:        2.85 m/s MV E velocity: 108.00 cm/s MV A velocity: 67.70 cm/s    SHUNTS MV E/A ratio:  1.60         Systemic VTI:  0.20 m                             Systemic Diam: 1.80 cm Lyn Hollingshead  Paraschos MD Electronically signed by Marcina Millard MD Signature Date/Time: 05/21/2022/3:04:33 PM    Final    EEG adult  Result Date: 05/20/2022 Charlsie Quest, MD     05/20/2022  5:58 PM Patient Name: HEATER OSUCH MRN: [id number] Epilepsy Attending: Charlsie Quest Referring Physician/Provider: Lovenia Kim, DO Date: 05/20/2022 Duration: 20.21 mins Patient history: 87yo F with syncope. EEG to evaluate for seizure Level of alertness: Awake AEDs during EEG study: None Technical aspects: This EEG study was done with scalp electrodes positioned according to the 10-20 International system of electrode placement. Electrical activity was reviewed with band pass filter of 1-70Hz , sensitivity of 7 uV/mm, display speed of 6mm/sec with a 60Hz  notched filter applied as appropriate. EEG data were recorded continuously and digitally stored.  Video monitoring was available and reviewed as appropriate. Description: The posterior dominant rhythm consists of 8 Hz activity of moderate voltage (25-35 uV) seen predominantly in posterior head regions, symmetric and reactive to eye opening and eye closing. Hyperventilation and photic stimulation were not performed.   IMPRESSION: This study is within normal limits. No seizures or epileptiform discharges were seen throughout the recording. Charlsie Quest   CT Hip Left Wo Contrast  Result Date: 05/20/2022 CLINICAL DATA:  Near syncopal episode, found on the floor EXAM: CT OF THE LEFT HIP WITHOUT CONTRAST TECHNIQUE: Multidetector CT imaging of the left hip was performed according to the standard protocol. Multiplanar CT image reconstructions were also generated. RADIATION DOSE REDUCTION: This exam was performed according to the departmental dose-optimization program which includes automated exposure control, adjustment of the mA and/or kV according to  patient size and/or use of iterative reconstruction technique. COMPARISON:  None Available. FINDINGS: Bones/Joint/Cartilage No hip fracture or dislocation. Nondisplaced fracture of the S4 vertebral body. Normal alignment. No joint effusion. Mild osteoarthritis of the left hip. Ligaments Ligaments are suboptimally evaluated by CT. Muscles and Tendons Muscles are normal. No muscle atrophy. No intramuscular fluid collection or hematoma. Soft tissue No fluid collection or hematoma. No soft tissue mass. Diverticulosis without evidence of diverticulitis. IMPRESSION: 1. No acute fracture or dislocation of the left hip. 2. Acute nondisplaced fracture of the S4 vertebral body. 3. Mild osteoarthritis of the left hip. Electronically Signed   By: Elige Ko M.D.   On: 05/20/2022 15:20   DG Chest 1 View  Result Date: 05/20/2022 CLINICAL DATA:  Syncopal episode with fall 2 days ago. Asthma. Congestive heart failure. EXAM: CHEST  1 VIEW COMPARISON:  None Available. FINDINGS: The heart size and mediastinal contours are within normal limits. Both lungs are clear. The visualized skeletal structures are unremarkable. IMPRESSION: No active disease. Electronically Signed   By: Danae Orleans M.D.   On: 05/20/2022 14:19   DG Hip Unilat W or Wo Pelvis 2-3 Views Left  Result Date: 05/20/2022 CLINICAL DATA:  Fall 2 days ago.  Left hip pain. EXAM: DG HIP (WITH OR WITHOUT PELVIS) 2-3V LEFT COMPARISON:  None Available. FINDINGS: Subtle linear lucency and cortical step-off is seen in the intertrochanteric region of the left hip, suspicious for nondisplaced hip fracture. No other fractures identified. No evidence of dislocation. Mild-to-moderate left hip osteoarthritis is noted. IMPRESSION: Suspect nondisplaced intertrochanteric fracture of the left hip. Recommend further evaluation with left hip CT. Mild to moderate left hip osteoarthritis. Electronically Signed   By: Danae Orleans M.D.   On: 05/20/2022 14:19   CT Head Wo  Contrast  Result Date: 05/20/2022 CLINICAL DATA:  Neck trauma (Age >= 65y); Head  trauma, minor (Age >= 65y) EXAM: CT HEAD WITHOUT CONTRAST CT CERVICAL SPINE WITHOUT CONTRAST TECHNIQUE: Multidetector CT imaging of the head and cervical spine was performed following the standard protocol without intravenous contrast. Multiplanar CT image reconstructions of the cervical spine were also generated. RADIATION DOSE REDUCTION: This exam was performed according to the departmental dose-optimization program which includes automated exposure control, adjustment of the mA and/or kV according to patient size and/or use of iterative reconstruction technique. 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 No evidence of acute infarction, hemorrhage, hydrocephalus, extra-axial collection or mass lesion/mass effect. Vascular: Atherosclerotic calcifications involving the large vessels of the skull base. No unexpected hyperdense vessel. Skull: Normal. Negative for fracture or focal lesion. Sinuses/Orbits: Mucosal thickening within the left maxillary sinus. Other: None. CT CERVICAL SPINE FINDINGS Alignment: Facet joints are aligned without dislocation or traumatic listhesis. Dens and lateral masses are aligned. Straightening of the cervical lordosis. Skull base and vertebrae: No acute fracture. Unchanged degenerative subchondral cyst within the odontoid process. Bulky anterior endplate osteophytes throughout the mid to lower cervical spine. No suspicious lytic or sclerotic bone lesion. Soft tissues and spinal canal: No prevertebral fluid or swelling. No visible canal hematoma. Disc levels: Multilevel cervical spondylosis, not appreciably progressed from prior. Upper chest: No acute findings. Other: None. IMPRESSION: 1. No acute intracranial abnormality. 2. 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 3. No acute cervical  spine fracture or subluxation. 4. Multilevel cervical spondylosis, not appreciably progressed from prior. Electronically Signed   By: Duanne Guess D.O.   On: 05/20/2022 14:10   CT Cervical Spine Wo Contrast  Result Date: 05/20/2022 CLINICAL DATA:  Neck trauma (Age >= 65y); Head trauma, minor (Age >= 65y) EXAM: CT HEAD WITHOUT CONTRAST CT CERVICAL SPINE WITHOUT CONTRAST TECHNIQUE: Multidetector CT imaging of the head and cervical spine was performed following the standard protocol without intravenous contrast. Multiplanar CT image reconstructions of the cervical spine were also generated. RADIATION DOSE REDUCTION: This exam was performed according to the departmental dose-optimization program which includes automated exposure control, adjustment of the mA and/or kV according to patient size and/or use of iterative reconstruction technique. 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 No evidence of acute infarction, hemorrhage, hydrocephalus, extra-axial collection or mass lesion/mass effect. Vascular: Atherosclerotic calcifications involving the large vessels of the skull base. No unexpected hyperdense vessel. Skull: Normal. Negative for fracture or focal lesion. Sinuses/Orbits: Mucosal thickening within the left maxillary sinus. Other: None. CT CERVICAL SPINE FINDINGS Alignment: Facet joints are aligned without dislocation or traumatic listhesis. Dens and lateral masses are aligned. Straightening of the cervical lordosis. Skull base and vertebrae: No acute fracture. Unchanged degenerative subchondral cyst within the odontoid process. Bulky anterior endplate osteophytes throughout the mid to lower cervical spine. No suspicious lytic or sclerotic bone lesion. Soft tissues and spinal canal: No prevertebral fluid or swelling. No visible canal hematoma. Disc levels: Multilevel cervical spondylosis, not appreciably  progressed from prior. Upper chest: No acute findings. Other: None. IMPRESSION: 1. No acute intracranial abnormality. 2. 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 3. No acute cervical spine fracture or subluxation. 4. Multilevel cervical spondylosis, not appreciably progressed from prior. Electronically Signed   By: Duanne Guess D.O.   On: 05/20/2022 14:10    Microbiology: Results for orders placed or performed during the hospital encounter of 02/08/22  Resp Panel by  RT-PCR (Flu A&B, Covid) Nasopharyngeal Swab     Status: None   Collection Time: 02/08/22  9:44 PM   Specimen: Nasopharyngeal Swab; Nasopharyngeal(NP) swabs in vial transport medium  Result Value Ref Range Status   SARS Coronavirus 2 by RT PCR NEGATIVE NEGATIVE Final    Comment: (NOTE) SARS-CoV-2 target nucleic acids are NOT DETECTED.  The SARS-CoV-2 RNA is generally detectable in upper respiratory specimens during the acute phase of infection. The lowest concentration of SARS-CoV-2 viral copies this assay can detect is 138 copies/mL. A negative result does not preclude SARS-Cov-2 infection and should not be used as the sole basis for treatment or other patient management decisions. A negative result may occur with  improper specimen collection/handling, submission of specimen other than nasopharyngeal swab, presence of viral mutation(s) within the areas targeted by this assay, and inadequate number of viral copies(<138 copies/mL). A negative result must be combined with clinical observations, patient history, and epidemiological information. The expected result is Negative.  Fact Sheet for Patients:  BloggerCourse.com  Fact Sheet for Healthcare Providers:  SeriousBroker.it  This test is no t yet approved or cleared by the [place] FDA and  has been authorized for detection and/or diagnosis of SARS-CoV-2 by FDA under an Emergency Use  Authorization (EUA). This EUA will remain  in effect (meaning this test can be used) for the duration of the COVID-19 declaration under Section 564(b)(1) of the Act, 21 U.S.C.section 360bbb-3(b)(1), unless the authorization is terminated  or revoked sooner.       Influenza A by PCR NEGATIVE NEGATIVE Final   Influenza B by PCR NEGATIVE NEGATIVE Final    Comment: (NOTE) The Xpert Xpress SARS-CoV-2/FLU/RSV plus assay is intended as an aid in the diagnosis of influenza from Nasopharyngeal swab specimens and should not be used as a sole basis for treatment. Nasal washings and aspirates are unacceptable for Xpert Xpress SARS-CoV-2/FLU/RSV testing.  Fact Sheet for Patients: BloggerCourse.com  Fact Sheet for Healthcare Providers: SeriousBroker.it  This test is not yet approved or cleared by the [place] FDA and has been authorized for detection and/or diagnosis of SARS-CoV-2 by FDA under an Emergency Use Authorization (EUA). This EUA will remain in effect (meaning this test can be used) for the duration of the COVID-19 declaration under Section 564(b)(1) of the Act, 21 U.S.C. section 360bbb-3(b)(1), unless the authorization is terminated or revoked.  Performed at [facility], [address] [place], [place] [address]     Labs: CBC: Recent Labs  Lab 05/20/22 1327 05/21/22 0649  WBC 6.4 5.9  HGB 11.5* 11.3*  HCT 35.1* 34.6*  MCV 90.9 90.3  PLT 208 195   Basic Metabolic Panel: Recent Labs  Lab 05/20/22 1327 05/20/22 1337 05/21/22 0649  NA 141  --  143  K 3.0*  --  3.4*  CL 104  --  108  CO2 28  --  30  GLUCOSE 118*  --  88  BUN 21  --  18  CREATININE 1.51*  --  1.12*  CALCIUM 8.8*  --  9.0  MG  --  2.3  --    Liver Function Tests: No results for input(s): "AST", "ALT", "ALKPHOS", "BILITOT", "PROT", "ALBUMIN" in the last 168 hours. CBG: No results for input(s): "GLUCAP" in the last 168  hours.  Discharge time spent: greater than 30 minutes.  This record has been created using Conservation officer, historic buildings. Errors have been sought and corrected,but may not always be located. Such creation errors do  not reflect on the standard of care.   Signed: Arnetha Courser, MD [facility] 05/22/2022

## 2022-05-22 NOTE — Progress Notes (Signed)
[facility]  SUBJECTIVE: Patient laying in bed, denies chest pain or shortness of breath, wishes to go home   Vitals:   05/21/22 1934 05/22/22 0018 05/22/22 0500 05/22/22 0727  BP: (!) 130/56 (!) 126/41 (!) 139/56 (!) 124/45  Pulse: (!) 50 65 64 (!) 50  Resp: 15 17 20 17   Temp: 99.5 F (37.5 C)  99 F (37.2 C) 98 F (36.7 C)  TempSrc: Oral  Oral   SpO2: 97% 96% 98% 98%  Weight:      Height:         Intake/Output Summary (Last 24 hours) at 05/22/2022 05/24/2022 Last data filed at 05/22/2022 0020 Gross per 24 hour  Intake 578 ml  Output 1525 ml  Net -947 ml      PHYSICAL EXAM  General: Well developed, well nourished, in no acute distress HEENT:  Normocephalic and atramatic Neck:  No JVD.  Lungs: Clear bilaterally to auscultation and percussion. Heart: HRRR . Normal S1 and S2 without gallops or murmurs.  Abdomen: Bowel sounds are positive, abdomen soft and non-tender  Msk:  Back normal, normal gait. Normal strength and tone for age. Extremities: No clubbing, cyanosis or edema.   Neuro: Alert and oriented X 3. Psych:  Good affect, responds appropriately   LABS: Basic Metabolic Panel: Recent Labs    05/20/22 1327 05/20/22 1337 05/21/22 0649  NA 141  --  143  K 3.0*  --  3.4*  CL 104  --  108  CO2 28  --  30  GLUCOSE 118*  --  88  BUN 21  --  18  CREATININE 1.51*  --  1.12*  CALCIUM 8.8*  --  9.0  MG  --  2.3  --    Liver Function Tests: No results for input(s): "AST", "ALT", "ALKPHOS", "BILITOT", "PROT", "ALBUMIN" in the last 72 hours. No results for input(s): "LIPASE", "AMYLASE" in the last 72 hours. CBC: Recent Labs    05/20/22 1327 05/21/22 0649  WBC 6.4 5.9  HGB 11.5* 11.3*  HCT 35.1* 34.6*  MCV 90.9 90.3  PLT 208 195   Cardiac Enzymes: No results for input(s): "CKTOTAL", "CKMB", "CKMBINDEX", "TROPONINI" in the last 72 hours. BNP: Invalid input(s): "POCBNP" D-Dimer: No results for input(s): "DDIMER" in the last 72 hours. Hemoglobin A1C: No  results for input(s): "HGBA1C" in the last 72 hours. Fasting Lipid Panel: No results for input(s): "CHOL", "HDL", "LDLCALC", "TRIG", "CHOLHDL", "LDLDIRECT" in the last 72 hours. Thyroid Function Tests: Recent Labs    05/20/22 1539  TSH 1.071   Anemia Panel: No results for input(s): "VITAMINB12", "FOLATE", "FERRITIN", "TIBC", "IRON", "RETICCTPCT" in the last 72 hours.  ECHOCARDIOGRAM COMPLETE  Result Date: 05/21/2022    ECHOCARDIOGRAM REPORT   Patient Name:   Andrea Phillips Date of Exam: 05/21/2022 Medical Rec #:  05/23/2022           Height:       61.0 in Accession #:    [id number]          Weight:       151.0 lb Date of Birth:  11/08/1934           BSA:          1.676 m Patient Age:    87 years            BP:           84/25 mmHg Patient Gender: F  HR:           62 bpm. Exam Location:  [facility] Procedure: 2D Echo and Intracardiac Opacification Agent Indications:     Abnormal ECG R94.31  History:         Patient has prior history of Echocardiogram examinations, most                  recent 02/09/2022.  Sonographer:     Overton Mam RDCS Referring Phys:  [id number] AVWUJWJ AMIN Diagnosing Phys: Marcina Millard MD  Sonographer Comments: Technically difficult study due to poor echo windows, suboptimal apical window and suboptimal parasternal window. Image acquisition challenging due to patient body habitus. This study was performed with the patient supine. IMPRESSIONS  1. Left ventricular ejection fraction, by estimation, is 50 to 55%. The left ventricle has low normal function. The left ventricle has no regional wall motion abnormalities. Left ventricular diastolic parameters were normal.  2. Right ventricular systolic function is normal. The right ventricular size is normal.  3. The mitral valve is normal in structure. Mild mitral valve regurgitation. No evidence of mitral stenosis.  4. The aortic valve is normal in structure. Aortic valve regurgitation is not visualized. No aortic  stenosis is present.  5. The inferior vena cava is normal in size with greater than 50% respiratory variability, suggesting right atrial pressure of 3 mmHg. FINDINGS  Left Ventricle: Left ventricular ejection fraction, by estimation, is 50 to 55%. The left ventricle has low normal function. The left ventricle has no regional wall motion abnormalities. Definity contrast agent was given IV to delineate the left ventricular endocardial borders. The left ventricular internal cavity size was normal in size. There is no left ventricular hypertrophy. Left ventricular diastolic parameters were normal. Right Ventricle: The right ventricular size is normal. No increase in right ventricular wall thickness. Right ventricular systolic function is normal. Left Atrium: Left atrial size was normal in size. Right Atrium: Right atrial size was normal in size. Pericardium: There is no evidence of pericardial effusion. Mitral Valve: The mitral valve is normal in structure. Mild mitral valve regurgitation. No evidence of mitral valve stenosis. Tricuspid Valve: The tricuspid valve is normal in structure. Tricuspid valve regurgitation is mild . No evidence of tricuspid stenosis. Aortic Valve: The aortic valve is normal in structure. Aortic valve regurgitation is not visualized. No aortic stenosis is present. Aortic valve peak gradient measures 15.4 mmHg. Pulmonic Valve: The pulmonic valve was normal in structure. Pulmonic valve regurgitation is not visualized. No evidence of pulmonic stenosis. Aorta: The aortic root is normal in size and structure. Venous: The inferior vena cava is normal in size with greater than 50% respiratory variability, suggesting right atrial pressure of 3 mmHg. IAS/Shunts: No atrial level shunt detected by color flow Doppler.  LEFT VENTRICLE PLAX 2D LVIDd:         4.43 cm     Diastology LVIDs:         3.22 cm     LV e' medial:    6.53 cm/s LV PW:         1.11 cm     LV E/e' medial:  16.5 LV IVS:        0.89 cm      LV e' lateral:   8.16 cm/s LVOT diam:     1.80 cm     LV E/e' lateral: 13.2 LV SV:         52 LV SV Index:   31 LVOT Area:  2.54 cm  LV Volumes (MOD) LV vol d, MOD A2C: 68.8 ml LV vol d, MOD A4C: 91.5 ml LV vol s, MOD A2C: 31.0 ml LV vol s, MOD A4C: 36.3 ml LV SV MOD A2C:     37.8 ml LV SV MOD A4C:     91.5 ml LV SV MOD BP:      46.2 ml RIGHT VENTRICLE RV Basal diam:  3.42 cm RV S prime:     10.70 cm/s TAPSE (M-mode): 2.0 cm LEFT ATRIUM             Index        RIGHT ATRIUM           Index LA diam:        4.40 cm 2.63 cm/m   RA Area:     13.60 cm LA Vol (A2C):   69.2 ml 41.29 ml/m  RA Volume:   34.60 ml  20.64 ml/m LA Vol (A4C):   45.5 ml 27.15 ml/m LA Biplane Vol: 56.1 ml 33.47 ml/m  AORTIC VALVE                 PULMONIC VALVE AV Area (Vmax): 1.11 cm     PV Vmax:       0.96 m/s AV Vmax:        196.00 cm/s  PV Peak grad:  3.7 mmHg AV Peak Grad:   15.4 mmHg LVOT Vmax:      85.50 cm/s LVOT Vmean:     53.700 cm/s LVOT VTI:       0.205 m  AORTA Ao Root diam: 2.40 cm Ao Asc diam:  2.90 cm MITRAL VALVE                TRICUSPID VALVE MV Area (PHT): 2.00 cm     TV Peak grad:   32.5 mmHg MV Decel Time: 379 msec     TV Vmax:        2.85 m/s MV E velocity: 108.00 cm/s MV A velocity: 67.70 cm/s   SHUNTS MV E/A ratio:  1.60         Systemic VTI:  0.20 m                             Systemic Diam: 1.80 cm Marcina Millard MD Electronically signed by Marcina Millard MD Signature Date/Time: 05/21/2022/3:04:33 PM    Final    EEG adult  Result Date: 05/20/2022 Charlsie Quest, MD     05/20/2022  5:58 PM Patient Name: GENESSIS FIELDER MRN: [id number] Epilepsy Attending: Charlsie Quest Referring Physician/Provider: Lovenia Kim, DO Date: 05/20/2022 Duration: 20.21 mins Patient history: 87yo F with syncope. EEG to evaluate for seizure Level of alertness: Awake AEDs during EEG study: None Technical aspects: This EEG study was done with scalp electrodes positioned according to the 10-20 International system of  electrode placement. Electrical activity was reviewed with band pass filter of 1-70Hz , sensitivity of 7 uV/mm, display speed of 49mm/sec with a 60Hz  notched filter applied as appropriate. EEG data were recorded continuously and digitally stored.  Video monitoring was available and reviewed as appropriate. Description: The posterior dominant rhythm consists of 8 Hz activity of moderate voltage (25-35 uV) seen predominantly in posterior head regions, symmetric and reactive to eye opening and eye closing. Hyperventilation and photic stimulation were not performed.   IMPRESSION: This study is within normal limits. No seizures or epileptiform discharges were seen  throughout the recording. Charlsie Quest   CT Hip Left Wo Contrast  Result Date: 05/20/2022 CLINICAL DATA:  Near syncopal episode, found on the floor EXAM: CT OF THE LEFT HIP WITHOUT CONTRAST TECHNIQUE: Multidetector CT imaging of the left hip was performed according to the standard protocol. Multiplanar CT image reconstructions were also generated. RADIATION DOSE REDUCTION: This exam was performed according to the departmental dose-optimization program which includes automated exposure control, adjustment of the mA and/or kV according to patient size and/or use of iterative reconstruction technique. COMPARISON:  None Available. FINDINGS: Bones/Joint/Cartilage No hip fracture or dislocation. Nondisplaced fracture of the S4 vertebral body. Normal alignment. No joint effusion. Mild osteoarthritis of the left hip. Ligaments Ligaments are suboptimally evaluated by CT. Muscles and Tendons Muscles are normal. No muscle atrophy. No intramuscular fluid collection or hematoma. Soft tissue No fluid collection or hematoma. No soft tissue mass. Diverticulosis without evidence of diverticulitis. IMPRESSION: 1. No acute fracture or dislocation of the left hip. 2. Acute nondisplaced fracture of the S4 vertebral body. 3. Mild osteoarthritis of the left hip. Electronically  Signed   By: Elige Ko M.D.   On: 05/20/2022 15:20   DG Chest 1 View  Result Date: 05/20/2022 CLINICAL DATA:  Syncopal episode with fall 2 days ago. Asthma. Congestive heart failure. EXAM: CHEST  1 VIEW COMPARISON:  None Available. FINDINGS: The heart size and mediastinal contours are within normal limits. Both lungs are clear. The visualized skeletal structures are unremarkable. IMPRESSION: No active disease. Electronically Signed   By: Danae Orleans M.D.   On: 05/20/2022 14:19   DG Hip Unilat W or Wo Pelvis 2-3 Views Left  Result Date: 05/20/2022 CLINICAL DATA:  Fall 2 days ago.  Left hip pain. EXAM: DG HIP (WITH OR WITHOUT PELVIS) 2-3V LEFT COMPARISON:  None Available. FINDINGS: Subtle linear lucency and cortical step-off is seen in the intertrochanteric region of the left hip, suspicious for nondisplaced hip fracture. No other fractures identified. No evidence of dislocation. Mild-to-moderate left hip osteoarthritis is noted. IMPRESSION: Suspect nondisplaced intertrochanteric fracture of the left hip. Recommend further evaluation with left hip CT. Mild to moderate left hip osteoarthritis. Electronically Signed   By: Danae Orleans M.D.   On: 05/20/2022 14:19   CT Head Wo Contrast  Result Date: 05/20/2022 CLINICAL DATA:  Neck trauma (Age >= 65y); Head trauma, minor (Age >= 65y) EXAM: CT HEAD WITHOUT CONTRAST CT CERVICAL SPINE WITHOUT CONTRAST TECHNIQUE: Multidetector CT imaging of the head and cervical spine was performed following the standard protocol without intravenous contrast. Multiplanar CT image reconstructions of the cervical spine were also generated. RADIATION DOSE REDUCTION: This exam was performed according to the departmental dose-optimization program which includes automated exposure control, adjustment of the mA and/or kV according to patient size and/or use of iterative reconstruction technique. COMPARISON:  02/09/2022, 11/14/2021 FINDINGS: CT HEAD FINDINGS Brain: Continued evolution  of known previous left PCA territory infarct. No evidence of acute infarction, hemorrhage, hydrocephalus, extra-axial collection or mass lesion/mass effect. Vascular: Atherosclerotic calcifications involving the large vessels of the skull base. No unexpected hyperdense vessel. Skull: Normal. Negative for fracture or focal lesion. Sinuses/Orbits: Mucosal thickening within the left maxillary sinus. Other: None. CT CERVICAL SPINE FINDINGS Alignment: Facet joints are aligned without dislocation or traumatic listhesis. Dens and lateral masses are aligned. Straightening of the cervical lordosis. Skull base and vertebrae: No acute fracture. Unchanged degenerative subchondral cyst within the odontoid process. Bulky anterior endplate osteophytes throughout the mid to lower cervical spine. No suspicious  lytic or sclerotic bone lesion. Soft tissues and spinal canal: No prevertebral fluid or swelling. No visible canal hematoma. Disc levels: Multilevel cervical spondylosis, not appreciably progressed from prior. Upper chest: No acute findings. Other: None. IMPRESSION: 1. No acute intracranial abnormality. 2. 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 3. No acute cervical spine fracture or subluxation. 4. Multilevel cervical spondylosis, not appreciably progressed from prior. Electronically Signed   By: Duanne Guess D.O.   On: 05/20/2022 14:10   CT Cervical Spine Wo Contrast  Result Date: 05/20/2022 CLINICAL DATA:  Neck trauma (Age >= 65y); Head trauma, minor (Age >= 65y) EXAM: CT HEAD WITHOUT CONTRAST CT CERVICAL SPINE WITHOUT CONTRAST TECHNIQUE: Multidetector CT imaging of the head and cervical spine was performed following the standard protocol without intravenous contrast. Multiplanar CT image reconstructions of the cervical spine were also generated. RADIATION DOSE REDUCTION: This exam was performed according to the departmental dose-optimization program which includes automated exposure  control, adjustment of the mA and/or kV according to patient size and/or use of iterative reconstruction technique. 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 No evidence of acute infarction, hemorrhage, hydrocephalus, extra-axial collection or mass lesion/mass effect. Vascular: Atherosclerotic calcifications involving the large vessels of the skull base. No unexpected hyperdense vessel. Skull: Normal. Negative for fracture or focal lesion. Sinuses/Orbits: Mucosal thickening within the left maxillary sinus. Other: None. CT CERVICAL SPINE FINDINGS Alignment: Facet joints are aligned without dislocation or traumatic listhesis. Dens and lateral masses are aligned. Straightening of the cervical lordosis. Skull base and vertebrae: No acute fracture. Unchanged degenerative subchondral cyst within the odontoid process. Bulky anterior endplate osteophytes throughout the mid to lower cervical spine. No suspicious lytic or sclerotic bone lesion. Soft tissues and spinal canal: No prevertebral fluid or swelling. No visible canal hematoma. Disc levels: Multilevel cervical spondylosis, not appreciably progressed from prior. Upper chest: No acute findings. Other: None. IMPRESSION: 1. No acute intracranial abnormality. 2. 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 3. No acute cervical spine fracture or subluxation. 4. Multilevel cervical spondylosis, not appreciably progressed from prior. Electronically Signed   By: Duanne Guess D.O.   On: 05/20/2022 14:10     Echo EF 40-45% 02/09/2022  TELEMETRY: Sinus bradycardia 55 bpm:  ASSESSMENT AND PLAN:  Principal Problem:   Symptomatic bradycardia Active Problems:   Essential hypertension   Syncope and collapse   Frequent PVCs   Elevated troponin   Chronic HFrEF (heart failure with reduced ejection fraction)  with improved EF(HCC)   Hypothyroidism   AKI  (acute kidney injury) (HCC)   Coronary artery disease involving native coronary artery of native heart without angina pectoris   Sleep apnea with use of continuous positive airway pressure (CPAP)   Atrial fibrillation and flutter (HCC)   Hypokalemia    1. Syncope, likely secondary to junctional bradycardia, due to recent up titration of carvedilol from 3.125 to 6.25 mg twice daily.  EEG negative. 2.  Junctional bradycardia, appeared to occur after recent increase in carvedilol dose from 6.25 to 12.5 mg twice daily, resolved, currently in sinus bradycardia at 55 bpm, asymptomatic 3.  Paroxysmal atrial fibrillation, on Eliquis for stroke prevention, and amiodarone for rate and rhythm control, currently in sinus bradycardia 4.  Coronary artery disease, occluded distal OM3  10/24/2021, treated medically, episode of chest pain yesterday, no ECG changes, troponin essentially normal (22, 21) 5.  Mild ischemic cardiomyopathy, LVEF 40-45% 02/09/2022 6.  Essential hypertension, blood pressure well controlled, on hydralazine   Recommendations   1.  Agree with current therapy 2.  Hold carvedilol at this time 3.  Continue Eliquis for stroke prevention 4.  Ambulate patient today, if patient does well consider discharge and follow-up with Dr. Gwen Pounds in 1 to 2 weeks   Marcina Millard, MD, PhD, [facility] 05/22/2022 9:22 AM

## 2022-05-30 ENCOUNTER — Other Ambulatory Visit: Payer: Medicare Other | Admitting: Student

## 2022-05-30 DIAGNOSIS — R55 Syncope and collapse: Secondary | ICD-10-CM

## 2022-05-30 DIAGNOSIS — R52 Pain, unspecified: Secondary | ICD-10-CM

## 2022-05-30 DIAGNOSIS — Z515 Encounter for palliative care: Secondary | ICD-10-CM

## 2022-05-30 DIAGNOSIS — I4891 Unspecified atrial fibrillation: Secondary | ICD-10-CM

## 2022-05-30 DIAGNOSIS — I2510 Atherosclerotic heart disease of native coronary artery without angina pectoris: Secondary | ICD-10-CM

## 2022-05-30 DIAGNOSIS — I5022 Chronic systolic (congestive) heart failure: Secondary | ICD-10-CM

## 2022-05-30 NOTE — Progress Notes (Unsigned)
  [facility] Consult Note Telephone: [phone]  Fax: [phone]    Date of encounter: 05/30/22 1:17 PM PATIENT NAME: Andrea Phillips [address] [place] [address]   [phone] (home)  DOB: 03/25/1935 MRN: [id number] PRIMARY CARE PROVIDER:    Khan, Neelam S, MD,  [address] [place] [address] [phone]  REFERRING PROVIDER:   Khan, Neelam S, MD [address] [place],  [place] [address] [phone]  RESPONSIBLE PARTY:    Contact Information     Name Relation Home Work Mobile   Sowers,Karen Niece   [phone]   Clark,Charlotte Sister [phone]  [phone]   Hobgood,Melissa Niece   [phone]   Lawton,Jessica Granddaughter   [phone]   Hoback,William Grandson   [phone]        I met face to face with patient in the home. Palliative Care was asked to follow this patient by consultation request of  Khan, Neelam S, MD to address advance care planning and complex medical decision making. This is a follow up visit.                                   ASSESSMENT AND PLAN / RECOMMENDATIONS:   Advance Care Planning/Goals of Care: Goals include to maximize quality of life and symptom management. Patient/health care surrogate gave his/her permission to discuss. Our advance care planning conversation included a discussion about:    The value and importance of advance care planning  Experiences with loved ones who have been seriously ill or have died  Exploration of personal, cultural or spiritual beliefs that might influence medical decisions  CODE STATUS: Full Code  Patient interested in life alert; will refer to palliative SW.   Symptom Management/Plan:  Chronic systolic HF, CAD-denies shortness of breath at rest; occasionally with exertion, no LE edema. Denies chest pain. Carvedilol stopped. Patient to continue furosemide as directed. Continue daily weights. Continue Imdur and  telmisartan as directed. Patient currently followed by HH nursing.   Atrial fibrillation and flutter-amiodarone and apixaban as directed. Follow up with cardiology as scheduled.   Syncope-no episodes reported since hospitalization. Patient removed Holter monitor after 3 days. Her carvedilol and hydralazine were stopped. She is to follow up with cardiology as scheduled.   Pain-secondary to non displaced S4 fracture. Start acetaminophen 500 mg TID; education on 3000 mg max daily dose. Start Lidocaine patch 4%, apply to sacral region on 12 hours/off 12 hours. Has tramadol PRN; encourage patient to use for moderate to severe pain.  Follow up Palliative Care Visit: Palliative care will continue to follow for complex medical decision making, advance care planning, and clarification of goals. Return in 8 weeks or prn.   This visit was coded based on medical decision making (MDM).   HOSPICE ELIGIBILITY/DIAGNOSIS: TBD  Chief Complaint: Palliative Medicine follow up visit.   HISTORY OF PRESENT ILLNESS:  Andrea Phillips is a 87 y.o. year old female  with COPD, chronic systolic heart failure, syncope & collapse, hypertension, hyperlipidemia, sleep apnea, hypothyroidism. Patient recently hospitalized 7/28-7/30/23 due to symptomatic bradycardia, syncope and collapse, chronic systolic HF, CAD, frequent PVC's. Patient noted to have nondisplaced fracture of S4 vertebral body.  Patient reports feeling better today, than yesterday. She does endorse pain to her buttocks, "tail bone." Pain is a 4/10. She is taking tylenol occasionally. Denies chest pain, palpitations, syncopal or near syncopal episodes. Denies shortness of breath at   rest; endorses with exertion. Denies fatigue, LE edema. She is weighing self daily. She removed Holter monitor today; follow up with cardiology. Has HH nurse coming out with [facility].   Patient does exhibit grimacing when going from sitting to standing position; ambulatory without  assistive device.   History obtained from review of EMR, discussion with primary team, and interview with family, facility staff/caregiver and/or Ms. Niehoff.  I reviewed available labs, medications, imaging, studies and related documents from the EMR.  Records reviewed and summarized above.   ROS  10-Point ROS is negative, except for the pertinent positives and negatives detailed per the HPI.   Physical Exam:  Pulse 104, resp 16, b/p 138/78, sats 94% on room air Constitutional: NAD General: frail appearing EYES: anicteric sclera, lids intact, no discharge  ENMT: intact hearing, oral mucous membranes moist, dentition intact CV: S1S2, tachycardia, no LE edema Pulmonary: LCTA, no increased work of breathing, no cough, room air Abdomen: normo-active BS + 4 quadrants, soft and non tender, no ascites GU: deferred MSK: moves all extremities, ambulatory Skin: warm and dry, no rashes or wounds on visible skin Neuro: + generalized weakness, A & O x 3 Psych: non-anxious affect, pleasant Hem/lymph/immuno: no widespread bruising   Thank you for the opportunity to participate in the care of Ms. Feider.  The palliative care team will continue to follow. Please call our office at [phone] if we can be of additional assistance.    S , NP   COVID-19 PATIENT SCREENING TOOL Asked and negative response unless otherwise noted:   Have you had symptoms of covid, tested positive or been in contact with someone with symptoms/positive test in the past 5-10 days? No  

## 2022-06-02 ENCOUNTER — Other Ambulatory Visit: Payer: Medicare Other

## 2022-06-02 DIAGNOSIS — Z789 Other specified health status: Secondary | ICD-10-CM

## 2022-06-02 NOTE — Progress Notes (Signed)
PC SW outreached patient, per [facility] NP SW referral request.  Patient shared that she was interested in life alert systems to review as she has had some syncope episodes and is alone in her home majority of the time. Patient inquired if her insurance would cover for a life alert system. SW advised patient to call her insurance provider customer care number to inquire about benefits offered. Patient in agreement for SW to mail her life alert systems information to address on file.  Patient had no other psychosocial needs at this time.

## 2022-06-13 ENCOUNTER — Telehealth: Payer: Self-pay

## 2022-06-13 NOTE — Telephone Encounter (Signed)
1125 am. Message received from [place], NP with [facility] needing a call back to discuss POC.  Return call made to and spoke with Delaney Meigs. NP unavailable at this time.  I have requested the fax number so the last 2 Palliative Care notes can be sent to the provider.

## 2022-07-07 ENCOUNTER — Other Ambulatory Visit: Payer: Self-pay | Admitting: Internal Medicine

## 2022-07-07 DIAGNOSIS — Z1231 Encounter for screening mammogram for malignant neoplasm of breast: Secondary | ICD-10-CM

## 2022-07-26 ENCOUNTER — Other Ambulatory Visit: Payer: Self-pay

## 2022-07-26 ENCOUNTER — Encounter: Payer: Self-pay | Admitting: Cardiology

## 2022-07-26 ENCOUNTER — Encounter
Admission: RE | Disposition: A | Discharge status: Home Health | Payer: Self-pay | Source: Home / Self Care | Attending: Cardiology

## 2022-07-26 ENCOUNTER — Observation Stay
Admission: RE | Admit: 2022-07-26 | Discharge: 2022-07-27 | Disposition: A | Discharge status: Home Health | Payer: Medicare Other | Attending: Cardiology | Admitting: Cardiology

## 2022-07-26 DIAGNOSIS — E039 Hypothyroidism, unspecified: Secondary | ICD-10-CM | POA: Diagnosis not present

## 2022-07-26 DIAGNOSIS — Z96652 Presence of left artificial knee joint: Secondary | ICD-10-CM | POA: Insufficient documentation

## 2022-07-26 DIAGNOSIS — R55 Syncope and collapse: Secondary | ICD-10-CM | POA: Diagnosis present

## 2022-07-26 DIAGNOSIS — I110 Hypertensive heart disease with heart failure: Secondary | ICD-10-CM | POA: Insufficient documentation

## 2022-07-26 DIAGNOSIS — Z8616 Personal history of COVID-19: Secondary | ICD-10-CM | POA: Diagnosis not present

## 2022-07-26 DIAGNOSIS — I495 Sick sinus syndrome: Principal | ICD-10-CM | POA: Insufficient documentation

## 2022-07-26 DIAGNOSIS — Z79899 Other long term (current) drug therapy: Secondary | ICD-10-CM | POA: Diagnosis not present

## 2022-07-26 DIAGNOSIS — Z7901 Long term (current) use of anticoagulants: Secondary | ICD-10-CM | POA: Insufficient documentation

## 2022-07-26 DIAGNOSIS — I5022 Chronic systolic (congestive) heart failure: Secondary | ICD-10-CM | POA: Insufficient documentation

## 2022-07-26 DIAGNOSIS — I480 Paroxysmal atrial fibrillation: Secondary | ICD-10-CM | POA: Diagnosis not present

## 2022-07-26 DIAGNOSIS — Z006 Encounter for examination for normal comparison and control in clinical research program: Secondary | ICD-10-CM | POA: Insufficient documentation

## 2022-07-26 DIAGNOSIS — I2510 Atherosclerotic heart disease of native coronary artery without angina pectoris: Secondary | ICD-10-CM | POA: Insufficient documentation

## 2022-07-26 DIAGNOSIS — I4891 Unspecified atrial fibrillation: Secondary | ICD-10-CM

## 2022-07-26 HISTORY — PX: PACEMAKER LEADLESS INSERTION: EP1219

## 2022-07-26 SURGERY — PACEMAKER LEADLESS INSERTION
Anesthesia: Moderate Sedation

## 2022-07-26 MED ORDER — SODIUM CHLORIDE 0.9% FLUSH
3.0000 mL | Freq: Two times a day (BID) | INTRAVENOUS | Status: DC
  Administered 2022-07-26: 3 mL via INTRAVENOUS

## 2022-07-26 MED ORDER — AMIODARONE HCL 200 MG PO TABS
200.0000 mg | ORAL_TABLET | Freq: Every day | ORAL | Status: DC
  Administered 2022-07-27: 200 mg via ORAL
  Filled 2022-07-26: qty 1

## 2022-07-26 MED ORDER — HEPARIN SODIUM (PORCINE) 1000 UNIT/ML IJ SOLN
INTRAMUSCULAR | Status: AC
  Filled 2022-07-26: qty 10

## 2022-07-26 MED ORDER — POTASSIUM CHLORIDE CRYS ER 10 MEQ PO TBCR
10.0000 meq | EXTENDED_RELEASE_TABLET | Freq: Every day | ORAL | Status: DC
  Administered 2022-07-26 – 2022-07-27 (×2): 10 meq via ORAL
  Filled 2022-07-26 (×2): qty 1

## 2022-07-26 MED ORDER — ALBUTEROL SULFATE (2.5 MG/3ML) 0.083% IN NEBU: 2.5000 mg | INHALATION_SOLUTION | Freq: Four times a day (QID) | RESPIRATORY_TRACT | Status: DC | PRN

## 2022-07-26 MED ORDER — SODIUM CHLORIDE 0.9% FLUSH: 3.0000 mL | INTRAVENOUS | Status: DC | PRN

## 2022-07-26 MED ORDER — FENTANYL CITRATE (PF) 100 MCG/2ML IJ SOLN
INTRAMUSCULAR | Status: DC | PRN
  Administered 2022-07-26: 25 ug via INTRAVENOUS
  Administered 2022-07-26 (×2): 12.5 ug via INTRAVENOUS

## 2022-07-26 MED ORDER — SODIUM CHLORIDE 0.9% FLUSH
3.0000 mL | Freq: Two times a day (BID) | INTRAVENOUS | Status: DC
  Administered 2022-07-26 (×2): 3 mL via INTRAVENOUS

## 2022-07-26 MED ORDER — LIDOCAINE HCL 1 % IJ SOLN
INTRAMUSCULAR | Status: AC
  Filled 2022-07-26: qty 20

## 2022-07-26 MED ORDER — LIDOCAINE HCL (PF) 1 % IJ SOLN
INTRAMUSCULAR | Status: DC | PRN
  Administered 2022-07-26: 30 mL

## 2022-07-26 MED ORDER — ONDANSETRON HCL 4 MG/2ML IJ SOLN: 4.0000 mg | Freq: Four times a day (QID) | INTRAMUSCULAR | Status: DC | PRN

## 2022-07-26 MED ORDER — ACETAMINOPHEN 325 MG PO TABS: 650.0000 mg | ORAL_TABLET | ORAL | Status: DC | PRN

## 2022-07-26 MED ORDER — MIDAZOLAM HCL 2 MG/2ML IJ SOLN
INTRAMUSCULAR | Status: AC
  Filled 2022-07-26: qty 2

## 2022-07-26 MED ORDER — MOMETASONE FURO-FORMOTEROL FUM 200-5 MCG/ACT IN AERO
2.0000 | INHALATION_SPRAY | Freq: Two times a day (BID) | RESPIRATORY_TRACT | Status: DC
  Administered 2022-07-26 – 2022-07-27 (×2): 2 via RESPIRATORY_TRACT
  Filled 2022-07-26 (×2): qty 8.8

## 2022-07-26 MED ORDER — SODIUM CHLORIDE 0.9 % IV SOLN: 250.0000 mL | INTRAVENOUS | Status: DC | PRN

## 2022-07-26 MED ORDER — LEVOTHYROXINE SODIUM 112 MCG PO TABS
112.0000 ug | ORAL_TABLET | Freq: Every day | ORAL | Status: DC
  Administered 2022-07-27: 112 ug via ORAL
  Filled 2022-07-26: qty 1

## 2022-07-26 MED ORDER — FLUTICASONE PROPIONATE 50 MCG/ACT NA SUSP: 2.0000 | Freq: Every day | NASAL | Status: DC | PRN

## 2022-07-26 MED ORDER — ICOSAPENT ETHYL 1 G PO CAPS
2.0000 g | ORAL_CAPSULE | Freq: Two times a day (BID) | ORAL | Status: DC
  Administered 2022-07-26 – 2022-07-27 (×2): 2 g via ORAL
  Filled 2022-07-26 (×2): qty 2

## 2022-07-26 MED ORDER — SODIUM CHLORIDE 0.9 % IV SOLN: INTRAVENOUS | Status: DC

## 2022-07-26 MED ORDER — PRAVASTATIN SODIUM 20 MG PO TABS
20.0000 mg | ORAL_TABLET | Freq: Every day | ORAL | Status: DC
  Administered 2022-07-26: 20 mg via ORAL
  Filled 2022-07-26: qty 1

## 2022-07-26 MED ORDER — MIDAZOLAM HCL 2 MG/2ML IJ SOLN
INTRAMUSCULAR | Status: DC | PRN
  Administered 2022-07-26 (×2): .5 mg via INTRAVENOUS
  Administered 2022-07-26: 1 mg via INTRAVENOUS

## 2022-07-26 MED ORDER — HEPARIN SODIUM (PORCINE) 1000 UNIT/ML IJ SOLN
INTRAMUSCULAR | Status: DC | PRN
  Administered 2022-07-26: 3500 [IU] via INTRAVENOUS

## 2022-07-26 MED ORDER — FUROSEMIDE 40 MG PO TABS
40.0000 mg | ORAL_TABLET | Freq: Every day | ORAL | Status: DC
  Administered 2022-07-26 – 2022-07-27 (×2): 40 mg via ORAL
  Filled 2022-07-26 (×2): qty 1

## 2022-07-26 MED ORDER — FENTANYL CITRATE (PF) 100 MCG/2ML IJ SOLN
INTRAMUSCULAR | Status: AC
  Filled 2022-07-26: qty 2

## 2022-07-26 MED ORDER — ISOSORBIDE MONONITRATE ER 30 MG PO TB24
30.0000 mg | ORAL_TABLET | Freq: Every day | ORAL | Status: DC
  Administered 2022-07-27: 30 mg via ORAL
  Filled 2022-07-26: qty 1

## 2022-07-26 MED ORDER — MAGNESIUM OXIDE -MG SUPPLEMENT 400 (240 MG) MG PO TABS
400.0000 mg | ORAL_TABLET | Freq: Every day | ORAL | Status: DC
  Administered 2022-07-27: 400 mg via ORAL
  Filled 2022-07-26: qty 1

## 2022-07-26 MED ORDER — IOHEXOL 300 MG/ML  SOLN
INTRAMUSCULAR | Status: DC | PRN
  Administered 2022-07-26: 10 mL

## 2022-07-26 SURGICAL SUPPLY — 15 items
DILATOR VESSEL 38 20CM 12FR (INTRODUCER) IMPLANT
DILATOR VESSEL 38 20CM 14FR (INTRODUCER) IMPLANT
DILATOR VESSEL 38 20CM 18FR (INTRODUCER) IMPLANT
DILATOR VESSEL 38 20CM 8FR (INTRODUCER) IMPLANT
MICRA AV TRANSCATH PACING SYS (Pacemaker) ×1 IMPLANT
MICRA INTRODUCER SHEATH (SHEATH) ×1
NDL PERC 18GX7CM (NEEDLE) IMPLANT
NEEDLE PERC 18GX7CM (NEEDLE) ×1 IMPLANT
PACK CARDIAC CATH (CUSTOM PROCEDURE TRAY) ×1 IMPLANT
PAD ELECT DEFIB RADIOL ZOLL (MISCELLANEOUS) IMPLANT
SHEATH AVANTI 7FRX11 (SHEATH) IMPLANT
SHEATH INTRODUCER MICRA (SHEATH) IMPLANT
SUT SILK 0 FSL (SUTURE) IMPLANT
SYSTEM PACING TRNSCTH AV MICRA (Pacemaker) IMPLANT
WIRE AMPLATZ SS-J .035X180CM (WIRE) IMPLANT

## 2022-07-27 ENCOUNTER — Encounter: Payer: Self-pay | Admitting: Cardiology

## 2022-07-27 DIAGNOSIS — I4892 Unspecified atrial flutter: Secondary | ICD-10-CM | POA: Diagnosis not present

## 2022-07-27 DIAGNOSIS — Z950 Presence of cardiac pacemaker: Secondary | ICD-10-CM | POA: Diagnosis not present

## 2022-07-27 DIAGNOSIS — I495 Sick sinus syndrome: Secondary | ICD-10-CM | POA: Diagnosis not present

## 2022-07-27 LAB — BASIC METABOLIC PANEL
Anion gap: 5 (ref 5–15)
BUN: 16 mg/dL (ref 8–23)
CO2: 28 mmol/L (ref 22–32)
Calcium: 9.5 mg/dL (ref 8.9–10.3)
Chloride: 108 mmol/L (ref 98–111)
Creatinine, Ser: 1.17 mg/dL — ABNORMAL HIGH (ref 0.44–1.00)
GFR, Estimated: 46 mL/min — ABNORMAL LOW (ref >60)
Glucose, Bld: 105 mg/dL — ABNORMAL HIGH (ref 70–99)
Potassium: 3.2 mmol/L — ABNORMAL LOW (ref 3.5–5.1)
Sodium: 141 mmol/L (ref 135–145)

## 2022-07-27 NOTE — Discharge Summary (Signed)
Physician Discharge Summary  Patient ID: Andrea Phillips MRN: [id number] DOB/AGE: 85/23/1936 85 y.o.  Admit date: 07/26/2022 Discharge date: 07/27/2022  Primary Discharge Diagnosis sick sinus syndrome Secondary Discharge Diagnosis atrial fibrillation  Significant Diagnostic Studies: yes  Consults: None  Hospital Course: The patient underwent elective Micra AV leadless pacemaker implantation on 27//2536 without complication.  Patient was observed overnight on telemetry out incident.  On the morning of 07/27/2022 patient was able to ambulate without difficulty and was discharged home.   Discharge Exam: Blood pressure (!) 147/93, pulse 74, temperature 98.4 F (36.9 C), resp. rate 17, height 5\' 1"  (1.549 m), weight 72.6 kg, SpO2 98 %.  General appearance: alert Head: atraumatic Eyes: conjunctivae/corneas clear. PERRL, EOM's intact. Fundi benign. Ears: normal TM's and external ear canals both ears Nose: Nares normal. Septum midline. Mucosa normal. No drainage or sinus tenderness. Throat: lips, mucosa, and tongue normal; teeth and gums normal Neck: no adenopathy, no carotid bruit, no JVD, supple, symmetrical, trachea midline, and thyroid not enlarged, symmetric, no tenderness/mass/nodules Back: negative, symmetric, no curvature. ROM normal. No CVA tenderness. Resp: clear to auscultation bilaterally Chest wall: no tenderness Cardio: regular rate and rhythm, S1, S2 normal, no murmur, click, rub or gallop GI: soft, non-tender; bowel sounds normal; no masses,  no organomegaly Extremities: extremities normal, atraumatic, no cyanosis or edema Pulses: 2+ and symmetric Skin: Skin color, texture, turgor normal. No rashes or lesions Neurologic: Grossly normal Incision/Wound: No hematoma present Labs:   Lab Results  Component Value Date   WBC 5.9 05/21/2022   HGB 11.3 (L) 05/21/2022   HCT 34.6 (L) 05/21/2022   MCV 90.3 05/21/2022   PLT 195 05/21/2022    Recent Labs  Lab  07/27/22 0405  NA 141  K 3.2*  CL 108  CO2 28  BUN 16  CREATININE 1.17*  CALCIUM 9.5  GLUCOSE 105*      Radiology:  EKG: Atrial flutter 74 bpm  FOLLOW UP PLANS AND APPOINTMENTS  Allergies as of 07/27/2022       Reactions   Aspirin Hives   Other Hives   Yellow dye 6 FOOD COLOR YELLOW POWDER   Atorvastatin Other (See Comments)   unknown   Codeine Other (See Comments)   Doesn't know   Conj Estrog-medroxyprogest Ace Other (See Comments)   PREMPRO 0.3-1.5 MG ORAL TABLET unknown   Prednisone Other (See Comments)   Chest pain   Raloxifene Other (See Comments)   EVISTA 60 MG ORAL TABLET unknown   Ace Inhibitors Cough   Amlodipine Itching   Valsartan Itching        Medication List     STOP taking these medications    FISH OIL ADULT GUMMIES PO   magnesium oxide 400 MG tablet Commonly known as: MAG-OX   ondansetron 4 MG disintegrating tablet Commonly known as: ZOFRAN-ODT   traMADol 50 MG tablet Commonly known as: ULTRAM       TAKE these medications    albuterol 108 (90 Base) MCG/ACT inhaler Commonly known as: VENTOLIN HFA Inhale 2 puffs into the lungs every 6 (six) hours as needed for wheezing or shortness of breath.   amiodarone 200 MG tablet Commonly known as: PACERONE Take 1 tablet (200 mg total) by mouth daily.   apixaban 5 MG Tabs tablet Commonly known as: ELIQUIS Take 1 tablet (5 mg total) by mouth 2 (two) times daily.   cetirizine 10 MG tablet Commonly known as: ZYRTEC Take 10 mg by mouth daily.   Dulera 200-5  MCG/ACT Aero Generic drug: mometasone-formoterol Inhale 2 puffs into the lungs 2 (two) times daily.   fluticasone 50 MCG/ACT nasal spray Commonly known as: FLONASE Place 1 spray into both nostrils 2 (two) times daily as needed for allergies or rhinitis.   furosemide 20 MG tablet Commonly known as: LASIX Take 1 tablet (20 mg total) by mouth daily. What changed: how much to take   icosapent Ethyl 1 g capsule Commonly known  as: VASCEPA Take 2 g by mouth 2 (two) times daily.   isosorbide mononitrate 30 MG 24 hr tablet Commonly known as: IMDUR Take 30 mg by mouth daily.   levothyroxine 112 MCG tablet Commonly known as: SYNTHROID Take 112 mcg by mouth daily.   omeprazole 20 MG tablet Commonly known as: PRILOSEC OTC Take 20 mg by mouth daily.   pravastatin 40 MG tablet Commonly known as: PRAVACHOL Take 1 tablet (40 mg total) by mouth daily at 6 PM.   telmisartan 80 MG tablet Commonly known as: MICARDIS Take 80 mg by mouth daily.        Follow-up Information     Lamar Blinks, MD Follow up.   Specialty: Cardiology Contact information: [address] [place] [place] [address] [phone]                 BRING ALL MEDICATIONS WITH YOU TO FOLLOW UP APPOINTMENTS  Time spent with patient to include physician time: 25 minutes Signed:  Marcina Millard MD, PhD, [facility] 07/27/2022, 8:29 AM

## 2022-07-27 NOTE — Discharge Instructions (Addendum)
Resume Eliquis on 07/28/2022.  Patient may shower on 07/29/2022 at which time may remove dressing and replace with bandage.

## 2022-07-27 NOTE — TOC CM/SW Note (Signed)
Patient has orders to discharge home today. Chart reviewed. She is active with [facility] for nursing. They are aware of plan for discharge today and do not need new orders since she is observation status. No further concerns. CSW signing off.  Dayton Scrape, [place]

## 2022-08-08 ENCOUNTER — Other Ambulatory Visit: Payer: Medicare Other | Admitting: Student

## 2022-08-08 ENCOUNTER — Ambulatory Visit: Payer: Medicare Other

## 2022-08-08 DIAGNOSIS — Z7189 Other specified counseling: Secondary | ICD-10-CM

## 2022-08-08 DIAGNOSIS — Z515 Encounter for palliative care: Secondary | ICD-10-CM

## 2022-08-08 DIAGNOSIS — I10 Essential (primary) hypertension: Secondary | ICD-10-CM

## 2022-08-08 NOTE — Progress Notes (Signed)
[facility] Consult Note Telephone: [phone]  Fax: [phone]    Date of encounter: 08/08/22 2:48 PM PATIENT NAME: Andrea Phillips [address] [place] [address]   [phone] (home)  DOB: November 03, 1934 MRN: [id number] PRIMARY CARE PROVIDER:    Perrin Maltese, MD,  [address] [place] [address] [phone]  REFERRING PROVIDER:   Perrin Maltese, [place],  [place] [address] [phone]  RESPONSIBLE PARTY:    Contact Information     Name Relation Home Work Mobile   Sowers,Karen Niece   [phone]   Clark,Charlotte Sister [phone]  [phone]   Hobgood,Melissa Niece   [phone]   Ala Dach   [phone]   Aneth, Schlagel   [phone]        I met face to face with patient in the home. Palliative Care was asked to follow this patient by consultation request of  Perrin Maltese, MD to address advance care planning and complex medical decision making. This is a follow up visit.                                   ASSESSMENT AND PLAN / RECOMMENDATIONS:   Advance Care Planning/Goals of Care: Goals include to maximize quality of life and symptom management. Patient/health care surrogate gave his/her permission to discuss. Our advance care planning conversation included a discussion about:    The value and importance of advance care planning  Experiences with loved ones who have been seriously ill or have died  Exploration of personal, cultural or spiritual beliefs that might influence medical decisions  Exploration of goals of care in the event of a sudden injury or illness  Review and updating or creation of an  advance directive document CODE STATUS: Full Code  Education provided on Palliative Medicine. MOST form completed today. Will follow up to see if [name] is seeing patient.   Symptom Management/Plan:  ACP-completed MOST form today;  attempt CPR X 1, Full scope of treatment, antibiotics and IV fluids as indicated, no feeding tube.   Hypertension- patient's b/p elevated today. She has not taken her medications. Patient to continue telmisartan and furosemide as directed. She took during visit today. Her grand daughter fills her pill box. Discussed importance of taking all of her medications as ordered.  S/p pacemaker-patient s/p Micra AV leadless pacemaker implantation on 07/26/22. Follow up with cardiology as scheduled.    Follow up Palliative Care Visit: Palliative care will continue to follow for complex medical decision making, advance care planning, and clarification of goals. Return  weeks or prn.   This visit was coded based on medical decision making (MDM).  PPS: 60%  HOSPICE ELIGIBILITY/DIAGNOSIS: TBD  Chief Complaint: Palliative Medicine follow up visit.   HISTORY OF PRESENT ILLNESS:  Andrea Phillips is a 87 y.o. year old female  with COPD, chronic systolic heart failure,  CAD, syncope & collapse, hypertension, hyperlipidemia, sleep apnea, hypothyroidism, sick sinus syndrome, bradycardia, syncope, atrial fibrillation.  Patient s/p pacemaker placement on 07/26/22. She denies pain or shortness of breath at rest. Endorses shortness of breath with exertion, no syncopal episodes, no LE edema. She does endorse some fatigue. She is unclear of HH will continue; she states they needed to re certify on last visit. No recent falls. Patient received sitting on her porch. Patient's blood pressure elevated. She had only taken her levothyroxine and  amiodarone today. Reviewed medications and she took her medications during visit. She states her grand daughter fills her medication box for her.   History obtained from review of EMR, discussion with primary team, and interview with family, facility staff/caregiver and/or Ms. Fitzsimons.  I reviewed available labs, medications, imaging, studies and related documents from the EMR.   Records reviewed and summarized above.   ROS  A 10-Point ROS is negative except for the pertinent positives and negatives detailed per the HPI.   Physical Exam: Pulse 62, resp 16, b/p 168/100, sats 97% on room air Constitutional: NAD General: frail appearing EYES: anicteric sclera, lids intact, no discharge  ENMT: intact hearing, oral mucous membranes moist, dentition intact CV: S1S2, RRR, no LE edema Pulmonary: LCTA, no increased work of breathing, no cough, room air Abdomen: normo-active BS + 4 quadrants, soft and non tender, no ascites GU: deferred FUX:NATFT all extremities, ambulatory Skin: warm and dry, no rashes or wounds on visible skin Neuro:  +generalized weakness Psych: non-anxious affect, A and O x 3, forgetful Hem/lymph/immuno: no widespread bruising   Thank you for the opportunity to participate in the care of Andrea Phillips.  The palliative care team will continue to follow. Please call our office at [phone] if we can be of additional assistance.   Ezekiel Slocumb, NP   COVID-19 PATIENT SCREENING TOOL Asked and negative response unless otherwise noted:   Have you had symptoms of covid, tested positive or been in contact with someone with symptoms/positive test in the past 5-10 days? No

## 2022-11-18 ENCOUNTER — Other Ambulatory Visit: Payer: Self-pay | Admitting: Internal Medicine

## 2022-11-25 LAB — CBC WITH DIFFERENTIAL/PLATELET
Basophils Absolute: 0.1 10*3/uL (ref 0.0–0.2)
Basos: 1 %
EOS (ABSOLUTE): 0.3 10*3/uL (ref 0.0–0.4)
Eos: 5 %
Hematocrit: 42.5 % (ref 34.0–46.6)
Hemoglobin: 14.1 g/dL (ref 11.1–15.9)
Immature Grans (Abs): 0 10*3/uL (ref 0.0–0.1)
Immature Granulocytes: 0 %
Lymphocytes Absolute: 1.3 10*3/uL (ref 0.7–3.1)
Lymphs: 26 %
MCH: 29.7 pg (ref 26.6–33.0)
MCHC: 33.2 g/dL (ref 31.5–35.7)
MCV: 90 fL (ref 79–97)
Monocytes Absolute: 0.4 10*3/uL (ref 0.1–0.9)
Monocytes: 8 %
Neutrophils Absolute: 3.1 10*3/uL (ref 1.4–7.0)
Neutrophils: 60 %
Platelets: 221 10*3/uL (ref 150–450)
RBC: 4.75 x10E6/uL (ref 3.77–5.28)
RDW: 13.9 % (ref 11.7–15.4)
WBC: 5.1 10*3/uL (ref 3.4–10.8)

## 2022-11-25 LAB — TSH+T4F+T3FREE
Free T4: 2.2 ng/dL — ABNORMAL HIGH (ref 0.82–1.77)
T3, Free: 1.4 pg/mL — ABNORMAL LOW (ref 2.0–4.4)
TSH: 8.02 u[IU]/mL — ABNORMAL HIGH (ref 0.450–4.500)

## 2022-11-25 LAB — CMP14+EGFR
ALT: 20 IU/L (ref 0–32)
AST: 23 IU/L (ref 0–40)
Albumin/Globulin Ratio: 1.7 (ref 1.2–2.2)
Albumin: 4 g/dL (ref 3.7–4.7)
Alkaline Phosphatase: 67 IU/L (ref 44–121)
BUN/Creatinine Ratio: 18 (ref 12–28)
BUN: 24 mg/dL (ref 8–27)
Bilirubin Total: 1 mg/dL (ref 0.0–1.2)
CO2: 27 mmol/L (ref 20–29)
Calcium: 10 mg/dL (ref 8.7–10.3)
Chloride: 104 mmol/L (ref 96–106)
Creatinine, Ser: 1.3 mg/dL — ABNORMAL HIGH (ref 0.57–1.00)
Globulin, Total: 2.3 g/dL (ref 1.5–4.5)
Glucose: 88 mg/dL (ref 70–99)
Potassium: 3.9 mmol/L (ref 3.5–5.2)
Sodium: 148 mmol/L — ABNORMAL HIGH (ref 134–144)
Total Protein: 6.3 g/dL (ref 6.0–8.5)
eGFR: 40 mL/min/{1.73_m2} — ABNORMAL LOW (ref >59)

## 2022-11-25 LAB — THYABS+TPO
Thyroglobulin Antibody: >2250 IU/mL — AB (ref 0.0–0.9)
Thyroid Peroxidase (TPO) Ab: 18 IU/mL (ref 0–34)

## 2022-11-25 LAB — LIPID PANEL W/O CHOL/HDL RATIO
Cholesterol, Total: 146 mg/dL (ref 100–199)
HDL: 53 mg/dL (ref >39)
LDL Chol Calc (NIH): 78 mg/dL (ref 0–99)
Triglycerides: 77 mg/dL (ref 0–149)
VLDL Cholesterol Cal: 15 mg/dL (ref 5–40)

## 2022-12-12 ENCOUNTER — Other Ambulatory Visit: Payer: Self-pay

## 2022-12-12 DIAGNOSIS — E782 Mixed hyperlipidemia: Secondary | ICD-10-CM

## 2022-12-13 MED ORDER — PRAVASTATIN SODIUM 40 MG PO TABS: 40.0000 mg | ORAL_TABLET | Freq: Every day | ORAL | 0 refills | Status: DC

## 2022-12-24 ENCOUNTER — Other Ambulatory Visit: Payer: Self-pay | Admitting: Internal Medicine

## 2022-12-24 DIAGNOSIS — E782 Mixed hyperlipidemia: Secondary | ICD-10-CM

## 2023-01-31 ENCOUNTER — Emergency Department: Payer: Medicare Other

## 2023-01-31 ENCOUNTER — Other Ambulatory Visit: Payer: Self-pay

## 2023-01-31 ENCOUNTER — Observation Stay
Admission: EM | Admit: 2023-01-31 | Discharge: 2023-02-03 | Disposition: A | Discharge status: Home / Self Care | Payer: Medicare Other | Attending: Internal Medicine | Admitting: Internal Medicine

## 2023-01-31 ENCOUNTER — Encounter: Payer: Self-pay | Admitting: Emergency Medicine

## 2023-01-31 DIAGNOSIS — E876 Hypokalemia: Secondary | ICD-10-CM | POA: Diagnosis not present

## 2023-01-31 DIAGNOSIS — R7989 Other specified abnormal findings of blood chemistry: Secondary | ICD-10-CM | POA: Insufficient documentation

## 2023-01-31 DIAGNOSIS — S7001XA Contusion of right hip, initial encounter: Secondary | ICD-10-CM | POA: Insufficient documentation

## 2023-01-31 DIAGNOSIS — R4020 Unspecified coma: Secondary | ICD-10-CM | POA: Insufficient documentation

## 2023-01-31 DIAGNOSIS — I2510 Atherosclerotic heart disease of native coronary artery without angina pectoris: Secondary | ICD-10-CM | POA: Insufficient documentation

## 2023-01-31 DIAGNOSIS — W19XXXA Unspecified fall, initial encounter: Secondary | ICD-10-CM | POA: Diagnosis not present

## 2023-01-31 DIAGNOSIS — N179 Acute kidney failure, unspecified: Secondary | ICD-10-CM | POA: Diagnosis not present

## 2023-01-31 DIAGNOSIS — J9811 Atelectasis: Secondary | ICD-10-CM | POA: Diagnosis not present

## 2023-01-31 DIAGNOSIS — J45909 Unspecified asthma, uncomplicated: Secondary | ICD-10-CM | POA: Diagnosis not present

## 2023-01-31 DIAGNOSIS — Z950 Presence of cardiac pacemaker: Secondary | ICD-10-CM | POA: Insufficient documentation

## 2023-01-31 DIAGNOSIS — I5022 Chronic systolic (congestive) heart failure: Secondary | ICD-10-CM | POA: Diagnosis not present

## 2023-01-31 DIAGNOSIS — R55 Syncope and collapse: Principal | ICD-10-CM | POA: Diagnosis present

## 2023-01-31 DIAGNOSIS — R296 Repeated falls: Secondary | ICD-10-CM

## 2023-01-31 DIAGNOSIS — Z79899 Other long term (current) drug therapy: Secondary | ICD-10-CM | POA: Diagnosis not present

## 2023-01-31 DIAGNOSIS — Z7901 Long term (current) use of anticoagulants: Secondary | ICD-10-CM | POA: Insufficient documentation

## 2023-01-31 DIAGNOSIS — I110 Hypertensive heart disease with heart failure: Secondary | ICD-10-CM | POA: Insufficient documentation

## 2023-01-31 DIAGNOSIS — I4821 Permanent atrial fibrillation: Secondary | ICD-10-CM | POA: Diagnosis not present

## 2023-01-31 DIAGNOSIS — R17 Unspecified jaundice: Secondary | ICD-10-CM | POA: Diagnosis not present

## 2023-01-31 DIAGNOSIS — Z96652 Presence of left artificial knee joint: Secondary | ICD-10-CM | POA: Diagnosis not present

## 2023-01-31 DIAGNOSIS — E039 Hypothyroidism, unspecified: Secondary | ICD-10-CM | POA: Diagnosis not present

## 2023-01-31 LAB — TROPONIN I (HIGH SENSITIVITY): Troponin I (High Sensitivity): 28 ng/L — ABNORMAL HIGH (ref <18)

## 2023-01-31 LAB — CBC WITH DIFFERENTIAL/PLATELET
Abs Immature Granulocytes: 0.02 10*3/uL (ref 0.00–0.07)
Basophils Absolute: 0 10*3/uL (ref 0.0–0.1)
Basophils Relative: 1 %
Eosinophils Absolute: 0.2 10*3/uL (ref 0.0–0.5)
Eosinophils Relative: 4 %
HCT: 38.6 % (ref 36.0–46.0)
Hemoglobin: 12.3 g/dL (ref 12.0–15.0)
Immature Granulocytes: 0 %
Lymphocytes Relative: 18 %
Lymphs Abs: 1.1 10*3/uL (ref 0.7–4.0)
MCH: 29 pg (ref 26.0–34.0)
MCHC: 31.9 g/dL (ref 30.0–36.0)
MCV: 91 fL (ref 80.0–100.0)
Monocytes Absolute: 0.6 10*3/uL (ref 0.1–1.0)
Monocytes Relative: 10 %
Neutro Abs: 4.1 10*3/uL (ref 1.7–7.7)
Neutrophils Relative %: 67 %
Platelets: 229 10*3/uL (ref 150–400)
RBC: 4.24 MIL/uL (ref 3.87–5.11)
RDW: 14.6 % (ref 11.5–15.5)
WBC: 6 10*3/uL (ref 4.0–10.5)
nRBC: 0 % (ref 0.0–0.2)

## 2023-01-31 LAB — URINALYSIS, ROUTINE W REFLEX MICROSCOPIC
Bilirubin Urine: NEGATIVE
Glucose, UA: 150 mg/dL — AB
Hgb urine dipstick: NEGATIVE
Ketones, ur: NEGATIVE mg/dL
Leukocytes,Ua: NEGATIVE
Nitrite: NEGATIVE
Protein, ur: NEGATIVE mg/dL
Specific Gravity, Urine: 1.005 (ref 1.005–1.030)
pH: 6 (ref 5.0–8.0)

## 2023-01-31 LAB — COMPREHENSIVE METABOLIC PANEL
ALT: 17 U/L (ref 0–44)
AST: 26 U/L (ref 15–41)
Albumin: 3 g/dL — ABNORMAL LOW (ref 3.5–5.0)
Alkaline Phosphatase: 69 U/L (ref 38–126)
Anion gap: 9 (ref 5–15)
BUN: 20 mg/dL (ref 8–23)
CO2: 30 mmol/L (ref 22–32)
Calcium: 9.2 mg/dL (ref 8.9–10.3)
Chloride: 103 mmol/L (ref 98–111)
Creatinine, Ser: 1.83 mg/dL — ABNORMAL HIGH (ref 0.44–1.00)
GFR, Estimated: 27 mL/min — ABNORMAL LOW (ref >60)
Glucose, Bld: 97 mg/dL (ref 70–99)
Potassium: 2.7 mmol/L — CL (ref 3.5–5.1)
Sodium: 142 mmol/L (ref 135–145)
Total Bilirubin: 1.3 mg/dL — ABNORMAL HIGH (ref 0.3–1.2)
Total Protein: 6.3 g/dL — ABNORMAL LOW (ref 6.5–8.1)

## 2023-01-31 MED ORDER — PRAVASTATIN SODIUM 20 MG PO TABS
40.0000 mg | ORAL_TABLET | Freq: Every day | ORAL | Status: DC
  Administered 2023-02-01 – 2023-02-02 (×2): 40 mg via ORAL
  Filled 2023-01-31 (×2): qty 2

## 2023-01-31 MED ORDER — ICOSAPENT ETHYL 1 G PO CAPS
2.0000 g | ORAL_CAPSULE | Freq: Two times a day (BID) | ORAL | Status: DC
  Administered 2023-02-01 – 2023-02-03 (×5): 2 g via ORAL
  Filled 2023-01-31 (×5): qty 2

## 2023-01-31 MED ORDER — ACETAMINOPHEN 650 MG RE SUPP: 650.0000 mg | Freq: Four times a day (QID) | RECTAL | Status: DC | PRN

## 2023-01-31 MED ORDER — ALBUTEROL SULFATE (2.5 MG/3ML) 0.083% IN NEBU: 2.5000 mg | INHALATION_SOLUTION | Freq: Four times a day (QID) | RESPIRATORY_TRACT | Status: DC | PRN

## 2023-01-31 MED ORDER — POTASSIUM CHLORIDE CRYS ER 10 MEQ PO TBCR
10.0000 meq | EXTENDED_RELEASE_TABLET | Freq: Two times a day (BID) | ORAL | Status: DC
  Administered 2023-02-01: 10 meq via ORAL
  Filled 2023-01-31: qty 1

## 2023-01-31 MED ORDER — AMIODARONE HCL 200 MG PO TABS
200.0000 mg | ORAL_TABLET | Freq: Every day | ORAL | Status: DC
  Administered 2023-02-01 – 2023-02-03 (×3): 200 mg via ORAL
  Filled 2023-01-31 (×3): qty 1

## 2023-01-31 MED ORDER — ISOSORBIDE MONONITRATE ER 30 MG PO TB24
30.0000 mg | ORAL_TABLET | Freq: Every day | ORAL | Status: DC
  Administered 2023-02-01 – 2023-02-02 (×2): 30 mg via ORAL
  Filled 2023-01-31 (×2): qty 1

## 2023-01-31 MED ORDER — POTASSIUM CHLORIDE 10 MEQ/100ML IV SOLN
10.0000 meq | Freq: Once | INTRAVENOUS | Status: AC
  Administered 2023-01-31: 10 meq via INTRAVENOUS
  Filled 2023-01-31: qty 100

## 2023-01-31 MED ORDER — MOMETASONE FURO-FORMOTEROL FUM 200-5 MCG/ACT IN AERO
2.0000 | INHALATION_SPRAY | Freq: Two times a day (BID) | RESPIRATORY_TRACT | Status: DC
  Administered 2023-02-01 – 2023-02-03 (×5): 2 via RESPIRATORY_TRACT
  Filled 2023-01-31: qty 8.8

## 2023-01-31 MED ORDER — ACETAMINOPHEN 325 MG PO TABS
650.0000 mg | ORAL_TABLET | Freq: Four times a day (QID) | ORAL | Status: DC | PRN
  Administered 2023-02-02: 650 mg via ORAL
  Filled 2023-01-31: qty 2

## 2023-01-31 MED ORDER — SODIUM CHLORIDE 0.9% FLUSH
3.0000 mL | Freq: Two times a day (BID) | INTRAVENOUS | Status: DC
  Administered 2023-02-01 – 2023-02-03 (×4): 3 mL via INTRAVENOUS

## 2023-01-31 MED ORDER — PANTOPRAZOLE SODIUM 40 MG PO TBEC
40.0000 mg | DELAYED_RELEASE_TABLET | Freq: Every day | ORAL | Status: DC
  Administered 2023-02-02 – 2023-02-03 (×2): 40 mg via ORAL
  Filled 2023-01-31 (×3): qty 1

## 2023-01-31 MED ORDER — APIXABAN 2.5 MG PO TABS
2.5000 mg | ORAL_TABLET | Freq: Two times a day (BID) | ORAL | Status: DC
  Filled 2023-01-31: qty 1

## 2023-01-31 MED ORDER — LEVOTHYROXINE SODIUM 112 MCG PO TABS
112.0000 ug | ORAL_TABLET | Freq: Every day | ORAL | Status: DC
  Administered 2023-02-01 – 2023-02-03 (×3): 112 ug via ORAL
  Filled 2023-01-31 (×3): qty 1

## 2023-01-31 MED ORDER — DONEPEZIL HCL 5 MG PO TABS
5.0000 mg | ORAL_TABLET | Freq: Every day | ORAL | Status: DC
  Administered 2023-02-01 – 2023-02-03 (×3): 5 mg via ORAL
  Filled 2023-01-31 (×3): qty 1

## 2023-01-31 MED ORDER — FUROSEMIDE 20 MG PO TABS
20.0000 mg | ORAL_TABLET | Freq: Every day | ORAL | Status: DC
  Filled 2023-01-31: qty 1

## 2023-01-31 MED ORDER — POTASSIUM CHLORIDE CRYS ER 20 MEQ PO TBCR
40.0000 meq | EXTENDED_RELEASE_TABLET | Freq: Once | ORAL | Status: AC
  Administered 2023-01-31: 40 meq via ORAL
  Filled 2023-01-31: qty 2

## 2023-01-31 MED ORDER — IRBESARTAN 150 MG PO TABS
300.0000 mg | ORAL_TABLET | Freq: Every day | ORAL | Status: DC
  Administered 2023-02-01: 300 mg via ORAL
  Filled 2023-01-31: qty 2

## 2023-01-31 MED ORDER — SODIUM CHLORIDE 0.9 % IV BOLUS
500.0000 mL | Freq: Once | INTRAVENOUS | Status: AC
  Administered 2023-01-31: 500 mL via INTRAVENOUS

## 2023-01-31 NOTE — ED Provider Notes (Signed)
[facility] Provider Note  Patient Contact: 6:13 PM (approximate)   History   Fall (/)   HPI  Andrea Phillips is a 88 y.o. female who presents emerged department complaining of multiple falls over the last 10 days.  Patient states that she has just darted Jardiance from her cardiologist prior to the onset of the symptoms.  She states that she has no headache, vision changes, chest pain, shortness of breath, abdominal pain, nausea vomiting, diarrhea or constipation.  Apparently she has vertigo and takes an unknown medication as needed for vertigo.  Unsure if she has been taking this.  She states that she has been walking, having syncopal episodes which is caused her to fall.  She arrived with an initial complaint of right hip pain but on further discussion with a close family friend who is present with the patient who helps with caregiving tasks patient has had other symptoms to include this weakness, dizziness as well as multiple syncopal episodes over the last 10 days.  Again all this started after starting her Jardiance.  Patient's only current complaint is right hip pain but she has been ambulating on her hip after the injuries.     Physical Exam   Triage Vital Signs: ED Triage Vitals  Enc Vitals Group     BP 01/31/23 1625 (!) 153/77     Pulse Rate 01/31/23 1625 61     Resp 01/31/23 1625 18     Temp 01/31/23 1625 98.2 F (36.8 C)     Temp Source 01/31/23 1625 Oral     SpO2 01/31/23 1625 96 %     Weight --      Height --      Head Circumference --      Peak Flow --      Pain Score 01/31/23 1623 3     Pain Loc --      Pain Edu? --      Excl. in GC? --     Most recent vital signs: Vitals:   01/31/23 1625 01/31/23 2315  BP: (!) 153/77 (!) 141/70  Pulse: 61 60  Resp: 18 18  Temp: 98.2 F (36.8 C) 98.1 F (36.7 C)  SpO2: 96% 98%     General: Alert and in no acute distress. Eyes:  PERRL. EOMI. Head: No acute traumatic findings ENT:       Ears:       Nose: No congestion/rhinnorhea.      Mouth/Throat: Mucous membranes are moist. Neck: No stridor. No cervical spine tenderness to palpation.  Cardiovascular:  Good peripheral perfusion Respiratory: Normal respiratory effort without tachypnea or retractions. Lungs CTAB. Good air entry to the bases with no decreased or absent breath sounds Gastrointestinal: Bowel sounds 4 quadrants. Soft and nontender to palpation. No guarding or rigidity. No palpable masses. No distention. No CVA tenderness. Musculoskeletal: Full range of motion to all extremities.  Visualization of the right hip reveals no obvious deformity.  No shortening or rotation of the right lower extremity. Neurologic:  No gross focal neurologic deficits are appreciated.  Skin:   No rash noted Other:   ED Results / Procedures / Treatments   Labs (all labs ordered are listed, but only abnormal results are displayed) Labs Reviewed  COMPREHENSIVE METABOLIC PANEL - Abnormal; Notable for the following components:      Result Value   Potassium 2.7 (*)    Creatinine, Ser 1.83 (*)    Total Protein 6.3 (*)  Albumin 3.0 (*)    Total Bilirubin 1.3 (*)    GFR, Estimated 27 (*)    All other components within normal limits  URINALYSIS, ROUTINE W REFLEX MICROSCOPIC - Abnormal; Notable for the following components:   Color, Urine STRAW (*)    APPearance CLEAR (*)    Glucose, UA 150 (*)    All other components within normal limits  TROPONIN I (HIGH SENSITIVITY) - Abnormal; Notable for the following components:   Troponin I (High Sensitivity) 28 (*)    All other components within normal limits  CBC WITH DIFFERENTIAL/PLATELET     EKG  ED ECG REPORT I, Delorise Royals Vencil Basnett,  personally viewed and interpreted this ECG.   Date: 0/06/2023  EKG Time: 1906 hrs.  Rate: 62 bpm  Rhythm: ventricularly paced rhythm.  Occasional PVC noted, unifocal,   Axis: Rightward axis  Intervals: Ventricularly paced rhythm  ST&T Change: No  gross elevation or depression   Ventricularly paced rhythm at a rate of 62 bpm.  Compared to last EKG from 07/27/2022, ventricular paced rhythm compared to previous A-fib.    RADIOLOGY  I personally viewed, evaluated, and interpreted these images as part of my medical decision making, as well as reviewing the written report by the radiologist.  ED Provider Interpretation: No acute traumatic findings on hip x-ray.  CT scans of the head, cervical spine revealed no signs of trauma.  Chest x-ray without evidence of acute cardiopulmonary disease.  DG Chest 2 View  Result Date: 01/31/2023 CLINICAL DATA:  Syncope, fall EXAM: CHEST - 2 VIEW COMPARISON:  05/20/2022 FINDINGS: Cardiomegaly. Platelike atelectasis in the left lower lobe. No confluent opacity on the right. No effusions or edema. No acute bony abnormality. IMPRESSION: Cardiomegaly. Left retrocardiac atelectasis. No active disease. Electronically Signed   By: Charlett Nose M.D.   On: 01/31/2023 19:08   CT Cervical Spine Wo Contrast  Result Date: 01/31/2023 CLINICAL DATA:  Neck trauma (Age >= 65y) EXAM: CT CERVICAL SPINE WITHOUT CONTRAST TECHNIQUE: Multidetector CT imaging of the cervical spine was performed without intravenous contrast. Multiplanar CT image reconstructions were also generated. RADIATION DOSE REDUCTION: This exam was performed according to the departmental dose-optimization program which includes automated exposure control, adjustment of the mA and/or kV according to patient size and/or use of iterative reconstruction technique. COMPARISON:  05/20/2022 FINDINGS: Alignment: Normal Skull base and vertebrae: No acute fracture. No primary bone lesion or focal pathologic process. Soft tissues and spinal canal: No prevertebral fluid or swelling. No visible canal hematoma. Disc levels: Diffuse degenerative disc disease. Large flowing osteophytes. Diffuse degenerative facet disease bilaterally. Upper chest: No acute findings Other: None  IMPRESSION: Degenerative disc and facet disease.  No acute bony abnormality. Electronically Signed   By: Charlett Nose M.D.   On: 01/31/2023 19:08   CT Head Wo Contrast  Result Date: 01/31/2023 CLINICAL DATA:  Syncope/presyncope, cerebrovascular cause suspected repeated syncope, fall, possibly hit head EXAM: CT HEAD WITHOUT CONTRAST TECHNIQUE: Contiguous axial images were obtained from the base of the skull through the vertex without intravenous contrast. RADIATION DOSE REDUCTION: This exam was performed according to the departmental dose-optimization program which includes automated exposure control, adjustment of the mA and/or kV according to patient size and/or use of iterative reconstruction technique. COMPARISON:  05/20/2022 FINDINGS: Brain: Old left occipital infarct, unchanged. No acute intracranial abnormality. Specifically, no hemorrhage, hydrocephalus, mass lesion, acute infarction, or significant intracranial injury. Vascular: No hyperdense vessel or unexpected calcification. Skull: No acute calvarial abnormality. Sinuses/Orbits: No acute findings  Other: None IMPRESSION: No acute intracranial abnormality. Electronically Signed   By: Charlett Nose M.D.   On: 01/31/2023 19:02   DG Hip Unilat W or Wo Pelvis 2-3 Views Right  Result Date: 01/31/2023 CLINICAL DATA:  Fall EXAM: DG HIP (WITH OR WITHOUT PELVIS) 2-3V RIGHT COMPARISON:  None. FINDINGS: The bones are osteopenic. No definite acute fracture or dislocation. Moderate degenerative changes affect the hips and lower lumbar spine. There are mild degenerative changes of the sacroiliac joints. IMPRESSION: 1. No definite acute fracture or dislocation. 2. Moderate degenerative changes of the hips and lower lumbar spine. Electronically Signed   By: Darliss Cheney M.D.   On: 01/31/2023 17:03    PROCEDURES:  Critical Care performed: No  Procedures   MEDICATIONS ORDERED IN ED: Medications  potassium chloride 10 mEq in 100 mL IVPB (10 mEq Intravenous  New Bag/Given 01/31/23 2313)  potassium chloride SA (KLOR-CON M) CR tablet 40 mEq (40 mEq Oral Given 01/31/23 2315)  sodium chloride 0.9 % bolus 500 mL (500 mLs Intravenous New Bag/Given 01/31/23 2315)     IMPRESSION / MDM / ASSESSMENT AND PLAN / ED COURSE  I reviewed the triage vital signs and the nursing notes.                                 Differential diagnosis includes, but is not limited to, fall, hip fracture, syncope, arrhythmia, CVA, head injury, electrolyte abnormality, UTI, pneumonia  Patient's presentation is most consistent with acute presentation with potential threat to life or bodily function.   Patient's diagnosis is consistent with hypokalemia, AKI, multiple falls.  Patient presents emergency department with 4 falls over the last 10 days.  Patient felt like this may have been medication induced that she was started on Jardiance.  However patient states that she has also been eating and drinking less than normal.  No reported fevers, URI symptoms, chest pain, shortness of breath, abdominal pain.  In 2 of her fall she landed on her right hip and was complaining of some mild hip pain but was still ambulatory on the hip.  Patient denies any dysuria, polyuria, hematuria.  No chest pain or shortness of breath.  She states that at least 2 of the falls she had a syncopal episode that caused the fall.  The other 2 were attributed to generalized weakness.  1 patient's workup, CT scan, x-ray of the hip and chest x-ray are reassuring with no evidence of intracranial abnormality, signs of trauma or signs of pneumonia on chest x-ray.  Patient's EKG reveals a paced rhythm.  Her troponin initially is in her typical range.  Patient was noted to have a creatinine of 1.83 with a baseline of 1.1.  Patient also had a potassium of 2.7.  It appears that she has been taking her Lasix without significant intake of fluids or solids.  Suspect that this is driving her potassium and her mild AKI.  Patient lives by  herself though she does have family living close by.  However my concern at this time is that if we discharge the patient she will likely sustain another fall.  Would prefer to admit the patient for potassium repletion, improve her AKI and have her more steady on her feet before discharge.  Will reach out to the hospitalist team at this time.Marland Kitchen     FINAL CLINICAL IMPRESSION(S) / ED DIAGNOSES   Final diagnoses:  Hypokalemia  AKI (acute  kidney injury)  Syncope and collapse  Multiple falls  Contusion of right hip, initial encounter     Rx / DC Orders   ED Discharge Orders     None        Note:  This document was prepared using Dragon voice recognition software and may include unintentional dictation errors.   Racheal PatchesCuthriell, Shakhia Gramajo D, PA-C 01/31/23 2321    Merwyn KatosBradler, Evan K, MD 01/31/23 [id number]

## 2023-01-31 NOTE — ED Notes (Signed)
First Nurse Note: Patient to ED via [facility] from home for right hip pain after a fall. Patient fell today and Sunday. Patient states it hurts worse when standing or moving. Aox4.

## 2023-01-31 NOTE — ED Notes (Signed)
Pt in ct scan/xray

## 2023-02-01 ENCOUNTER — Encounter: Payer: Self-pay | Admitting: Internal Medicine

## 2023-02-01 DIAGNOSIS — N179 Acute kidney failure, unspecified: Secondary | ICD-10-CM | POA: Diagnosis not present

## 2023-02-01 DIAGNOSIS — J9811 Atelectasis: Secondary | ICD-10-CM

## 2023-02-01 DIAGNOSIS — E876 Hypokalemia: Secondary | ICD-10-CM

## 2023-02-01 DIAGNOSIS — R55 Syncope and collapse: Secondary | ICD-10-CM | POA: Diagnosis not present

## 2023-02-01 LAB — CBC
HCT: 38.2 % (ref 36.0–46.0)
Hemoglobin: 12.4 g/dL (ref 12.0–15.0)
MCH: 29.1 pg (ref 26.0–34.0)
MCHC: 32.5 g/dL (ref 30.0–36.0)
MCV: 89.7 fL (ref 80.0–100.0)
Platelets: 212 10*3/uL (ref 150–400)
RBC: 4.26 MIL/uL (ref 3.87–5.11)
RDW: 14.8 % (ref 11.5–15.5)
WBC: 5.9 10*3/uL (ref 4.0–10.5)
nRBC: 0 % (ref 0.0–0.2)

## 2023-02-01 LAB — BASIC METABOLIC PANEL
Anion gap: 7 (ref 5–15)
Anion gap: 7 (ref 5–15)
BUN: 19 mg/dL (ref 8–23)
BUN: 18 mg/dL (ref 8–23)
CO2: 31 mmol/L (ref 22–32)
CO2: 29 mmol/L (ref 22–32)
Calcium: 9.1 mg/dL (ref 8.9–10.3)
Calcium: 9 mg/dL (ref 8.9–10.3)
Chloride: 108 mmol/L (ref 98–111)
Chloride: 107 mmol/L (ref 98–111)
Creatinine, Ser: 1.82 mg/dL — ABNORMAL HIGH (ref 0.44–1.00)
Creatinine, Ser: 1.89 mg/dL — ABNORMAL HIGH (ref 0.44–1.00)
GFR, Estimated: 27 mL/min — ABNORMAL LOW (ref >60)
GFR, Estimated: 26 mL/min — ABNORMAL LOW (ref >60)
Glucose, Bld: 108 mg/dL — ABNORMAL HIGH (ref 70–99)
Glucose, Bld: 113 mg/dL — ABNORMAL HIGH (ref 70–99)
Potassium: 2.8 mmol/L — ABNORMAL LOW (ref 3.5–5.1)
Potassium: 3.4 mmol/L — ABNORMAL LOW (ref 3.5–5.1)
Sodium: 146 mmol/L — ABNORMAL HIGH (ref 135–145)
Sodium: 143 mmol/L (ref 135–145)

## 2023-02-01 LAB — MAGNESIUM
Magnesium: 2.3 mg/dL (ref 1.7–2.4)
Magnesium: 2.1 mg/dL (ref 1.7–2.4)

## 2023-02-01 LAB — TROPONIN I (HIGH SENSITIVITY): Troponin I (High Sensitivity): 28 ng/L — ABNORMAL HIGH (ref <18)

## 2023-02-01 LAB — SODIUM, URINE, RANDOM: Sodium, Ur: 95 mmol/L

## 2023-02-01 LAB — POTASSIUM: Potassium: 2.9 mmol/L — ABNORMAL LOW (ref 3.5–5.1)

## 2023-02-01 LAB — CREATININE, URINE, RANDOM: Creatinine, Urine: 23 mg/dL

## 2023-02-01 MED ORDER — POTASSIUM CHLORIDE CRYS ER 20 MEQ PO TBCR
20.0000 meq | EXTENDED_RELEASE_TABLET | Freq: Two times a day (BID) | ORAL | Status: DC
  Administered 2023-02-01: 20 meq via ORAL
  Filled 2023-02-01: qty 1

## 2023-02-01 MED ORDER — POTASSIUM CHLORIDE 10 MEQ/100ML IV SOLN
10.0000 meq | INTRAVENOUS | Status: AC
  Administered 2023-02-01 (×3): 10 meq via INTRAVENOUS
  Filled 2023-02-01 (×2): qty 100

## 2023-02-01 MED ORDER — ADULT MULTIVITAMIN W/MINERALS CH
1.0000 | ORAL_TABLET | Freq: Every day | ORAL | Status: DC
  Administered 2023-02-01 – 2023-02-03 (×3): 1 via ORAL
  Filled 2023-02-01 (×3): qty 1

## 2023-02-01 MED ORDER — POTASSIUM CHLORIDE 10 MEQ/100ML IV SOLN
10.0000 meq | INTRAVENOUS | Status: AC
  Administered 2023-02-01: 10 meq via INTRAVENOUS
  Filled 2023-02-01 (×2): qty 100

## 2023-02-01 MED ORDER — APIXABAN 2.5 MG PO TABS
2.5000 mg | ORAL_TABLET | Freq: Two times a day (BID) | ORAL | Status: DC
  Administered 2023-02-01 – 2023-02-03 (×5): 2.5 mg via ORAL
  Filled 2023-02-01 (×5): qty 1

## 2023-02-01 MED ORDER — LACTATED RINGERS IV SOLN: INTRAVENOUS | Status: DC

## 2023-02-01 MED ORDER — DEXTROSE IN LACTATED RINGERS 5 % IV SOLN: INTRAVENOUS | Status: DC

## 2023-02-01 MED ORDER — ENSURE ENLIVE PO LIQD
237.0000 mL | Freq: Two times a day (BID) | ORAL | Status: DC
  Administered 2023-02-01 – 2023-02-03 (×3): 237 mL via ORAL

## 2023-02-01 NOTE — Consult Note (Signed)
[facility] CONSULT [facility]       Patient ID: Andrea Phillips MRN: [id number] DOB/AGE: June 28, 1935 88 y.o.  Admit date: 01/31/2023 Referring Physician Dr. Lurene ShadowBernard Ayiku  Primary Physician  Primary Cardiologist Minda DittoAmanda Steiner, PA-C  Reason for Consultation syncope   HPI: Andrea Phillips is an [age]yoF with a PMH of chronic HFrEF (EF 25-30% with moderate MR, TR, trivial AR 11/29/2022), permanent atrial fibrillation (amiodarone, Eliquis), SSS s/p Micra leadless pacemaker implantation (07/26/2022), CAD s/p LHC 10/24/21 with an occluded distal OM 3 (too small for PCI), 20% stenosed LAD, Hx CVA (01/2022), hypothyroidism who [facility] after multiple falls at home for which cardiology is consulted for further assistance.  The patient states that she "just falls" at home and has done so once in the shower 2 days prior to admission, and another time around 1 PM the day of admission 4/9.  She says she "has felt weak" and is limited by this weakness somewhat with doing things around the house.  She denies any preceding lightheadedness or dizziness before these falls.  Denies tripping over anything, and is not sure if the falls happen with changes in position. She initially tells me this has been going on for "a while" and is not sure if she has been following at more frequent intervals since she has been put on Jardiance or not.  (Although, the emergency department note and admission H&P do state that the falls have been occurring since the start of Jardiance). She denies chest pain, shortness of breath, heart racing, palpitations, or peripheral edema. She is asking when she can go home and "doesn't think this is related to her heart." She a limited historian with memory limitations surrounding the details of her falls. The patient feels as though she consumed enough fluids, but doesn't think she eats enough - unable to quantify how much she truly eats or fluids she drinks in a  day.  Recent vitals are notable for a blood pressure of 133/74, she is saturating 93% on room air.  Heart rate in the 60s and a ventricular paced rhythm on telemetry.  Labs notable for initial hypokalemia with potassium 2.7, which has been repleted with only slight improvement to 2.8 this morning. Slight AKI with CR 1.8 and GFR 27 compared to baseline of 1.3 from jan 2024. Troponin borderline elevated and flat at 28 on two repeats.   Review of systems complete and found to be negative unless listed above     Past Medical History:  Diagnosis Date   Arthritis    ra   Asthma    CHF (congestive heart failure)    Dysrhythmia    Edema extremities    GERD (gastroesophageal reflux disease)    Gout    Headache    History of hiatal hernia    Hypertension    Hypothyroidism    Shortness of breath dyspnea    Sleep apnea     Past Surgical History:  Procedure Laterality Date   APPENDECTOMY     BACK SURGERY     CARDIAC CATHETERIZATION     CATARACT EXTRACTION W/PHACO Left 03/09/2015   Procedure: CATARACT EXTRACTION PHACO AND INTRAOCULAR LENS PLACEMENT (IOC);  Surgeon: Sallee LangeSteven Dingeldein, MD;  Location: [facility] ORS;  Service: Ophthalmology;  Laterality: Left;  US 01:31 AP% 26.1 CDE 43.72   CATARACT EXTRACTION W/PHACO Right 08/19/2021   Procedure: CATARACT EXTRACTION PHACO AND INTRAOCULAR LENS PLACEMENT (IOC) RIGHT;  Surgeon: Galen ManilaPorfilio, William, MD;  Location: [facility] ORS;  Service: Ophthalmology;  Laterality: Right;  12.31 1:07.0   CATARACT EXTRACTION W/PHACO Right 09/07/2021   Procedure: REMOVAL OF LENS FRAGMENTS RIGHT;  Surgeon: Galen Manila, MD;  Location: [facility];  Service: Ophthalmology;  Laterality: Right;   CHOLECYSTECTOMY     CORONARY/GRAFT ACUTE MI REVASCULARIZATION N/A 10/24/2021   Procedure: Coronary/Graft Acute MI Revascularization;  Surgeon: Yvonne Kendall, MD;  Location: [facility] INVASIVE CV LAB;  Service: Cardiovascular;  Laterality: N/A;   EYE SURGERY     retina   JOINT  REPLACEMENT     left knee   LEFT HEART CATH AND CORONARY ANGIOGRAPHY N/A 04/02/2021   Procedure: LEFT HEART CATH AND CORONARY ANGIOGRAPHY;  Surgeon: Lamar Blinks, MD;  Location: [facility] INVASIVE CV LAB;  Service: Cardiovascular;  Laterality: N/A;   LEFT HEART CATH AND CORONARY ANGIOGRAPHY N/A 10/24/2021   Procedure: LEFT HEART CATH AND CORONARY ANGIOGRAPHY;  Surgeon: Yvonne Kendall, MD;  Location: [facility] INVASIVE CV LAB;  Service: Cardiovascular;  Laterality: N/A;   PACEMAKER LEADLESS INSERTION N/A 07/26/2022   Procedure: PACEMAKER LEADLESS INSERTION;  Surgeon: Marcina Millard, MD;  Location: [facility] INVASIVE CV LAB;  Service: Cardiovascular;  Laterality: N/A;    Medications Prior to Admission  Medication Sig Dispense Refill Last Dose   albuterol (VENTOLIN HFA) 108 (90 Base) MCG/ACT inhaler Inhale 2 puffs into the lungs every 6 (six) hours as needed for wheezing or shortness of breath.   unk at unk   amiodarone (PACERONE) 200 MG tablet Take 1 tablet (200 mg total) by mouth daily. 30 tablet 0 Past Week   apixaban (ELIQUIS) 5 MG TABS tablet Take 1 tablet (5 mg total) by mouth 2 (two) times daily. 60 tablet 1 Past Week   cetirizine (ZYRTEC) 10 MG tablet Take 10 mg by mouth daily.   Past Week   donepezil (ARICEPT) 5 MG tablet Take 5 mg by mouth daily.   Past Week   empagliflozin (JARDIANCE) 10 MG TABS tablet Take 1 tablet by mouth daily.   Past Week   fluticasone (FLONASE) 50 MCG/ACT nasal spray Place 1 spray into both nostrils 2 (two) times daily as needed for allergies or rhinitis.   unk at unk   furosemide (LASIX) 40 MG tablet Take 40 mg by mouth daily.   Past Month   icosapent Ethyl (VASCEPA) 1 g capsule TAKE 2 CAPSULES BY MOUTH TWICE DAILY WITH MEALS 360 capsule 2 Past Week   isosorbide mononitrate (IMDUR) 30 MG 24 hr tablet Take 30 mg by mouth daily.   Past Week   levothyroxine (SYNTHROID) 112 MCG tablet Take 112 mcg by mouth daily.   Past Week   mometasone-formoterol (DULERA) 200-5 MCG/ACT  AERO Inhale 2 puffs into the lungs 2 (two) times daily. 1 each 1 Past Week   omeprazole (PRILOSEC) 40 MG capsule Take 40 mg by mouth daily.   Past Week   potassium chloride (KLOR-CON M) 10 MEQ tablet Take 10 mEq by mouth 2 (two) times daily.   Past Week   pravastatin (PRAVACHOL) 40 MG tablet Take 1 tablet (40 mg total) by mouth daily at 6 PM. 90 tablet 0 Past Week   telmisartan (MICARDIS) 80 MG tablet Take 80 mg by mouth daily.   Past Week   carvedilol (COREG) 12.5 MG tablet Take 12.5 mg by mouth 2 (two) times daily. (Patient not taking: Reported on 01/31/2023)   Not Taking   Social History   Socioeconomic History   Marital status: Widowed    Spouse name: Not on file  Number of children: Not on file   Years of education: Not on file   Highest education level: Not on file  Occupational History   Not on file  Tobacco Use   Smoking status: Never   Smokeless tobacco: Never  Vaping Use   Vaping Use: Never used  Substance and Sexual Activity   Alcohol use: No   Drug use: No   Sexual activity: Not Currently  Other Topics Concern   Not on file  Social History Narrative   Lives with granddaughter, [name], and family.   Social Determinants of Health   Financial Resource Strain: Not on file  Food Insecurity: No Food Insecurity (02/01/2023)   Hunger Vital Sign    Worried About Running Out of Food in the Last Year: Never true    Ran Out of Food in the Last Year: Never true  Transportation Needs: No Transportation Needs (02/01/2023)   PRAPARE - Administrator, Civil Service (Medical): No    Lack of Transportation (Non-Medical): No  Physical Activity: Not on file  Stress: Not on file  Social Connections: Not on file  Intimate Partner Violence: Not At Risk (02/01/2023)   Humiliation, Afraid, Rape, and Kick questionnaire    Fear of Current or Ex-Partner: No    Emotionally Abused: No    Physically Abused: No    Sexually Abused: No    Family History  Problem Relation  Age of Onset   Heart disease Mother    Heart disease Father    Breast cancer Cousin       Intake/Output Summary (Last 24 hours) at 02/01/2023 0936 Last data filed at 02/01/2023 0811 Gross per 24 hour  Intake 1302.01 ml  Output --  Net 1302.01 ml    Vitals:   02/01/23 0230 02/01/23 0315 02/01/23 0511 02/01/23 0734  BP: (!) 141/69 (!) 169/80 (!) 160/65 (!) 152/79  Pulse: (!) 58 (!) 59 (!) 59 (!) 59  Resp: 16 16 20 17   Temp:  98.2 F (36.8 C) 98.9 F (37.2 C) 98.3 F (36.8 C)  TempSrc:  Oral    SpO2: 96% 97% 95% 94%  Weight:      Height:        PHYSICAL EXAM General: elderly caucasian female , well nourished, in no acute distress. Initially sitting in recliner after bath with nurse techs, self transfers from recliner to bed with walker assistance. HEENT:  Normocephalic and atraumatic. Neck:  No JVD.  Lungs: Normal respiratory effort on room air. Clear bilaterally to auscultation. No wheezes, crackles, rhonchi.  Heart: V paced . Normal S1 and S2 without gallops or murmurs.  Abdomen: Non-distended appearing.  Msk: Normal strength and tone for age. Extremities: Warm and well perfused. No clubbing, cyanosis. No peripheral edema.  Neuro: Alert and oriented X 3. Psych:  Answers questions appropriately, limited historian (forgets details).   Labs: Basic Metabolic Panel: Recent Labs    01/31/23 2049 02/01/23 0023 02/01/23 0415  NA 142  --  146*  K 2.7* 2.9* 2.8*  CL 103  --  108  CO2 30  --  31  GLUCOSE 97  --  108*  BUN 20  --  19  CREATININE 1.83*  --  1.82*  CALCIUM 9.2  --  9.1  MG 2.3  --   --    Liver Function Tests: Recent Labs    01/31/23 2049  AST 26  ALT 17  ALKPHOS 69  BILITOT 1.3*  PROT 6.3*  ALBUMIN  3.0*   No results for input(s): "LIPASE", "AMYLASE" in the last 72 hours. CBC: Recent Labs    01/31/23 2049 02/01/23 0415  WBC 6.0 5.9  NEUTROABS 4.1  --   HGB 12.3 12.4  HCT 38.6 38.2  MCV 91.0 89.7  PLT 229 212   Cardiac Enzymes: Recent  Labs    01/31/23 2049 02/01/23 0023  TROPONINIHS 28* 28*   BNP: No results for input(s): "BNP" in the last 72 hours. D-Dimer: No results for input(s): "DDIMER" in the last 72 hours. Hemoglobin A1C: No results for input(s): "HGBA1C" in the last 72 hours. Fasting Lipid Panel: No results for input(s): "CHOL", "HDL", "LDLCALC", "TRIG", "CHOLHDL", "LDLDIRECT" in the last 72 hours. Thyroid Function Tests: No results for input(s): "TSH", "T4TOTAL", "T3FREE", "THYROIDAB" in the last 72 hours.  Invalid input(s): "FREET3" Anemia Panel: No results for input(s): "VITAMINB12", "FOLATE", "FERRITIN", "TIBC", "IRON", "RETICCTPCT" in the last 72 hours.   Radiology: DG Chest 2 View  Result Date: 01/31/2023 CLINICAL DATA:  Syncope, fall EXAM: CHEST - 2 VIEW COMPARISON:  05/20/2022 FINDINGS: Cardiomegaly. Platelike atelectasis in the left lower lobe. No confluent opacity on the right. No effusions or edema. No acute bony abnormality. IMPRESSION: Cardiomegaly. Left retrocardiac atelectasis. No active disease. Electronically Signed   By: Charlett Nose M.D.   On: 01/31/2023 19:08   CT Cervical Spine Wo Contrast  Result Date: 01/31/2023 CLINICAL DATA:  Neck trauma (Age >= 65y) EXAM: CT CERVICAL SPINE WITHOUT CONTRAST TECHNIQUE: Multidetector CT imaging of the cervical spine was performed without intravenous contrast. Multiplanar CT image reconstructions were also generated. RADIATION DOSE REDUCTION: This exam was performed according to the departmental dose-optimization program which includes automated exposure control, adjustment of the mA and/or kV according to patient size and/or use of iterative reconstruction technique. COMPARISON:  05/20/2022 FINDINGS: Alignment: Normal Skull base and vertebrae: No acute fracture. No primary bone lesion or focal pathologic process. Soft tissues and spinal canal: No prevertebral fluid or swelling. No visible canal hematoma. Disc levels: Diffuse degenerative disc disease. Large  flowing osteophytes. Diffuse degenerative facet disease bilaterally. Upper chest: No acute findings Other: None IMPRESSION: Degenerative disc and facet disease.  No acute bony abnormality. Electronically Signed   By: Charlett Nose M.D.   On: 01/31/2023 19:08   CT Head Wo Contrast  Result Date: 01/31/2023 CLINICAL DATA:  Syncope/presyncope, cerebrovascular cause suspected repeated syncope, fall, possibly hit head EXAM: CT HEAD WITHOUT CONTRAST TECHNIQUE: Contiguous axial images were obtained from the base of the skull through the vertex without intravenous contrast. RADIATION DOSE REDUCTION: This exam was performed according to the departmental dose-optimization program which includes automated exposure control, adjustment of the mA and/or kV according to patient size and/or use of iterative reconstruction technique. COMPARISON:  05/20/2022 FINDINGS: Brain: Old left occipital infarct, unchanged. No acute intracranial abnormality. Specifically, no hemorrhage, hydrocephalus, mass lesion, acute infarction, or significant intracranial injury. Vascular: No hyperdense vessel or unexpected calcification. Skull: No acute calvarial abnormality. Sinuses/Orbits: No acute findings Other: None IMPRESSION: No acute intracranial abnormality. Electronically Signed   By: Charlett Nose M.D.   On: 01/31/2023 19:02   DG Hip Unilat W or Wo Pelvis 2-3 Views Right  Result Date: 01/31/2023 CLINICAL DATA:  Fall EXAM: DG HIP (WITH OR WITHOUT PELVIS) 2-3V RIGHT COMPARISON:  None. FINDINGS: The bones are osteopenic. No definite acute fracture or dislocation. Moderate degenerative changes affect the hips and lower lumbar spine. There are mild degenerative changes of the sacroiliac joints. IMPRESSION: 1. No definite acute fracture  or dislocation. 2. Moderate degenerative changes of the hips and lower lumbar spine. Electronically Signed   By: Darliss Cheney M.D.   On: 01/31/2023 17:03    ECHO 11/29/2022 SEVERE LV SYSTOLIC DYSFUNCTION (See  above)  NORMAL RIGHT VENTRICULAR SYSTOLIC FUNCTION  MODERATE VALVULAR REGURGITATION (See above)  NO VALVULAR STENOSIS  MODERATE MR, TR  MILD PR  TRIVIAL AR  EF 25-30%   TELEMETRY reviewed by me (LT) 02/01/2023 : v pacing 60bpm rare PVC  EKG reviewed by me: V paced 60 PVC  Data reviewed by me (LT) 02/01/2023: last outpatient cardiology note, ed note, admission H&P last 24h vitals tele labs imaging I/O    Principal Problem:   Syncope and collapse Active Problems:   Troponin level elevated   AKI (acute kidney injury)   Loss of consciousness   Hyperbilirubinemia   Atelectasis    ASSESSMENT AND PLAN:  Andrea Phillips is an 67yoF with a PMH of chronic HFrEF (EF 25-30% with moderate MR, TR, trivial AR 11/29/2022), permanent atrial fibrillation (amiodarone, Eliquis), SSS s/p Micra leadless pacemaker implantation (07/26/2022), CAD s/p LHC 10/24/21 with an occluded distal OM 3 (too small for PCI), 20% stenosed LAD, Hx CVA (01/2022), hypothyroidism who presented to [facility] ED/06/2023 after multiple falls at home for which cardiology is consulted for further assistance.  # syncope  # falls The patient is a limited historian regarding the circumstances of her falls and the duration of time they have been increasing in frequency. Per the Ed note and admission H&P the patient stated the falls have started since she started Jardiance for her HFrEF on 3/26. They occur while she is standing or walking, not mechanical in nature, and unclear regarding preceding prodrome. No current lightheadedness or dizziness. Unclear if they occur with position changes. Possibly related to dehydration/poor PO intake + the addition of Jardiance. She feels well and is asking to go home at my time of evaluation.  - obtain orthostatic vitals - stop jardiance - pacemaker interrogation performed at the bedside by me was without observations, device is functioning appropriately with estimated 24yrs battery life.  - encouraged good  PO intake of food and water - no additional cardiac diagnostics necessary   # Permanent AF  # SSS s/p Micra leadless pacemaker V pacing on tele rate 60s without additional arrhythmias noted.  - continue amiodarone 200mg  daily for now, recheck of TSH by PCP outpatient - continue eliquis 5mg  daily for stroke prevention   # Chronic HFrEF (25-30%) Euvolemic on exam. GDMT limited by multiple drug intolerances. Continue imdur 30mg  daily, telmistartan 80mg  daily. Stop jardiance as above. Hold lasix while inpatient, agree with restarting at lower dose of 20mg  daily at discharge. Consider addition of spiro outpatient or rechallenge of BB now that she has a PPM.   # CAD without chest pain  Chest pain free. EKG non acute.  Continue eliquis monotherapy. Intolerant of aspirin.  Continue pravastatin 40mg  daily.   # AKI Cr above recent baseline from jan 2024 at 1.8 and GFR of 21 today. Avoid nephrotoxins. Hydration as above.   This patient's plan of care was discussed and created with Dr. Darrold Junker and he is in agreement.  Signed: Rebeca Allegra , PA-C 02/01/2023, 9:36 AM [facility]

## 2023-02-01 NOTE — Assessment & Plan Note (Addendum)
Maintain the patient on telemetry, echo.  Trend troponin.  At this time clinical impression is that patient is having hypovolemic hypotension causing syncope and collapse.  Therefore I will check orthostatic hypotension.  Monitor response to IV hydration.  Patient is an already anticoagulated.  Therefore do not think VTE is a concern here.  I have requested evaluation by Dr. Mayford Knife of cardiology in the morning to kindly evaluate the pacemaker for interrogation.  Staff message sent to be delivered in the morning kindly follow-up

## 2023-02-01 NOTE — Assessment & Plan Note (Signed)
Due to syncope.  Concern for seizure episode at this time is low, monitor clinically.

## 2023-02-01 NOTE — H&P (Signed)
History and Physical    Patient: Andrea Phillips QMV:[id number] DOB: [birth year] DOA: 01/31/2023 DOS: the patient was seen and examined on 02/01/2023 PCP: Margaretann LovelessKhan, Neelam S, MD  Patient coming from: Home  Chief Complaint:  Chief Complaint  Patient presents with   Fall        HPI: Andrea Phillips is a 88 y.o. female with medical history significant of Atrial fibrillation/flutter as well as congestive heart failure who was in her usual state of health till about 10 days ago.  Since then patient reports at least 3 episodes of fall/loss of consciousness without any preceding vertigo focal weakness chest pain palpitation extreme sweating or feeling of warmth.  Patient denies any preceding vertigo.  Patient feels that she may pass out and then loses consciousness.  The latest episode happened today at approximately 1 PM and patient feels that the loss of consciousness was movement.  There was no tongue injury no incontinence.  None of these episodes have been witnessed.  After the fall today patient had some pain in her left hip and could not help help herself back up and crawled to the bed, contacted family and then [facility].  Patient denies any fever nausea vomiting diarrhea chest pain shortness of breath any change in urinary habits.  Patient at this time is feeling pretty good except for some soreness in her left hip otherwise no complaints Review of Systems: As mentioned in the history of present illness. All other systems reviewed and are negative. Past Medical History:  Diagnosis Date   Arthritis    ra   Asthma    CHF (congestive heart failure)    Dysrhythmia    Edema extremities    GERD (gastroesophageal reflux disease)    Gout    Headache    History of hiatal hernia    Hypertension    Hypothyroidism    Shortness of breath dyspnea    Sleep apnea    Past Surgical History:  Procedure Laterality Date   APPENDECTOMY     BACK SURGERY     CARDIAC  CATHETERIZATION     CATARACT EXTRACTION W/PHACO Left 03/09/2015   Procedure: CATARACT EXTRACTION PHACO AND INTRAOCULAR LENS PLACEMENT (IOC);  Surgeon: Sallee LangeSteven Dingeldein, MD;  Location: [facility] ORS;  Service: Ophthalmology;  Laterality: Left;  US 01:31 AP% 26.1 CDE 43.72   CATARACT EXTRACTION W/PHACO Right 08/19/2021   Procedure: CATARACT EXTRACTION PHACO AND INTRAOCULAR LENS PLACEMENT (IOC) RIGHT;  Surgeon: Galen ManilaPorfilio, William, MD;  Location: [facility] OR[facility];  Service: Ophthalmology;  Laterality: Right;  12.31 1:07.0   CATARACT EXTRACTION W/PHACO Right 09/07/2021   Procedure: REMOVAL OF LENS FRAGMENTS RIGHT;  Surgeon: Galen ManilaPorfilio, William, MD;  [facility];  Service: Ophthalmology;  Laterality: Right;   CHOLECYSTECTOMY     CORONARY/GRAFT ACUTE MI REVASCULARIZATION N/A 10/24/2021   Procedure: Coronary/Graft Acute MI Revascularization;  Surgeon: Yvonne KendallEnd, Christopher, MD;  Location: [facility];  Service: Cardiovascular;  Laterality: N/A;   EYE SURGERY     retina   JOINT REPLACEMENT     left knee   LEFT HEART CATH AND CORONARY ANGIOGRAPHY N/A 04/02/2021   Procedure: LEFT HEART CATH AND CORONARY ANGIOGRAPHY;  Surgeon: Lamar BlinksKowalski, Bruce J, MD;  Location: [facility];  Service: Cardiovascular;  Laterality: N/A;   LEFT HEART CATH AND CORONARY ANGIOGRAPHY N/A 10/24/2021   Procedure: LEFT HEART CATH AND CORONARY ANGIOGRAPHY;  Surgeon: Yvonne KendallEnd, Christopher, MD;  Location: [facility];  Service: Cardiovascular;  Laterality: N/A;   PACEMAKER LEADLESS INSERTION N/A 07/26/2022   Procedure: PACEMAKER LEADLESS INSERTION;  Surgeon: Marcina Millard, MD;  Location: [facility];  Service: Cardiovascular;  Laterality: N/A;   Social History:  reports that she has never smoked. She has never used smokeless tobacco. She reports that she does not drink alcohol and does not use drugs.  Allergies  Allergen Reactions   Aspirin Hives   Other Hives    Yellow dye 6 FOOD COLOR YELLOW  POWDER    Atorvastatin Other (See Comments)    unknown   Codeine Other (See Comments)    Doesn't know   Conj Estrog-Medroxyprogest Ace Other (See Comments)    PREMPRO 0.3-1.5 MG ORAL TABLET unknown   Prednisone Other (See Comments)    Chest pain   Raloxifene Other (See Comments)    EVISTA 60 MG ORAL TABLET unknown   Ace Inhibitors Cough   Amlodipine Itching   Valsartan Itching    Family History  Problem Relation Age of Onset   Heart disease Mother    Heart disease Father    Breast cancer Cousin     Prior to Admission medications   Medication Sig Start Date End Date Taking? Authorizing Provider  albuterol (VENTOLIN HFA) 108 (90 Base) MCG/ACT inhaler Inhale 2 puffs into the lungs every 6 (six) hours as needed for wheezing or shortness of breath.   Yes [provider]  amiodarone (PACERONE) 200 MG tablet Take 1 tablet (200 mg total) by mouth daily. 02/12/22  Yes Arnetha Courser, MD  apixaban (ELIQUIS) 5 MG TABS tablet Take 1 tablet (5 mg total) by mouth 2 (two) times daily. 02/11/22  Yes Arnetha Courser, MD  cetirizine (ZYRTEC) 10 MG tablet Take 10 mg by mouth daily.   Yes [provider]  donepezil (ARICEPT) 5 MG tablet Take 5 mg by mouth daily. 11/24/22  Yes [provider]  empagliflozin (JARDIANCE) 10 MG TABS tablet Take 1 tablet by mouth daily. 01/17/23 01/17/24 Yes [provider]  fluticasone (FLONASE) 50 MCG/ACT nasal spray Place 1 spray into both nostrils 2 (two) times daily as needed for allergies or rhinitis.   Yes [provider]  furosemide (LASIX) 40 MG tablet Take 40 mg by mouth daily. 12/16/22  Yes [provider]  icosapent Ethyl (VASCEPA) 1 g capsule TAKE 2 CAPSULES BY MOUTH TWICE DAILY WITH MEALS 12/24/22  Yes Margaretann Loveless, MD  isosorbide mononitrate (IMDUR) 30 MG 24 hr tablet Take 30 mg by mouth daily. 04/07/22  Yes [provider]  levothyroxine (SYNTHROID) 112 MCG tablet Take 112 mcg by mouth daily. 02/23/21   Yes [provider]  mometasone-formoterol (DULERA) 200-5 MCG/ACT AERO Inhale 2 puffs into the lungs 2 (two) times daily. 04/04/21  Yes Almon Hercules, MD  omeprazole (PRILOSEC) 40 MG capsule Take 40 mg by mouth daily. 05/16/22  Yes [provider]  potassium chloride (KLOR-CON M) 10 MEQ tablet Take 10 mEq by mouth 2 (two) times daily.   Yes [provider]  pravastatin (PRAVACHOL) 40 MG tablet Take 1 tablet (40 mg total) by mouth daily at 6 PM. 12/13/22  Yes Margaretann Loveless, MD  telmisartan (MICARDIS) 80 MG tablet Take 80 mg by mouth daily. 11/01/21  Yes [provider]  carvedilol (COREG) 12.5 MG tablet Take 12.5 mg by mouth 2 (two) times daily. Patient not taking: Reported on 01/31/2023 09/16/22   [provider]    Physical Exam: Vitals:   01/31/23 1625 01/31/23  2315  BP: (!) 153/77 (!) 141/70  Pulse: 61 60  Resp: 18 18  Temp: 98.2 F (36.8 C) 98.1 F (36.7 C)  TempSrc: Oral Oral  SpO2: 96% 98%   General: Alert awake oriented x 3 no focal motor deficit Respiratory exam: Bilateral intravesicular Cardiovascular exam S1-S2 normal Abdomen soft nontender Extremities warm without edema Musculoskeletal exam no focal spinal tenderness bilateral 5/5 strength in all 4 extremities Trace icterus Data Reviewed:  Labs on Admission:  Results for orders placed or performed during the hospital encounter of 01/31/23 (from the past 24 hour(s))  Comprehensive metabolic panel     Status: Abnormal   Collection Time: 01/31/23  8:49 PM  Result Value Ref Range   Sodium 142 135 - 145 mmol/L   Potassium 2.7 (LL) 3.5 - 5.1 mmol/L   Chloride 103 98 - 111 mmol/L   CO2 30 22 - 32 mmol/L   Glucose, Bld 97 70 - 99 mg/dL   BUN 20 8 - 23 mg/dL   Creatinine, Ser 1.61 (H) 0.44 - 1.00 mg/dL   Calcium 9.2 8.9 - 09.6 mg/dL   Total Protein 6.3 (L) 6.5 - 8.1 g/dL   Albumin 3.0 (L) 3.5 - 5.0 g/dL   AST 26 15 - 41 U/L   ALT 17 0 - 44 U/L   Alkaline Phosphatase 69 38 - 126  U/L   Total Bilirubin 1.3 (H) 0.3 - 1.2 mg/dL   GFR, Estimated 27 (L) >60 mL/min   Anion gap 9 5 - 15  Troponin I (High Sensitivity)     Status: Abnormal   Collection Time: 01/31/23  8:49 PM  Result Value Ref Range   Troponin I (High Sensitivity) 28 (H) <18 ng/L  CBC with Differential     Status: None   Collection Time: 01/31/23  8:49 PM  Result Value Ref Range   WBC 6.0 4.0 - 10.5 K/uL   RBC 4.24 3.87 - 5.11 MIL/uL   Hemoglobin 12.3 12.0 - 15.0 g/dL   HCT 04.5 40.9 - 81.1 %   MCV 91.0 80.0 - 100.0 fL   MCH 29.0 26.0 - 34.0 pg   MCHC 31.9 30.0 - 36.0 g/dL   RDW 91.4 78.2 - 95.6 %   Platelets 229 150 - 400 K/uL   nRBC 0.0 0.0 - 0.2 %   Neutrophils Relative % 67 %   Neutro Abs 4.1 1.7 - 7.7 K/uL   Lymphocytes Relative 18 %   Lymphs Abs 1.1 0.7 - 4.0 K/uL   Monocytes Relative 10 %   Monocytes Absolute 0.6 0.1 - 1.0 K/uL   Eosinophils Relative 4 %   Eosinophils Absolute 0.2 0.0 - 0.5 K/uL   Basophils Relative 1 %   Basophils Absolute 0.0 0.0 - 0.1 K/uL   Immature Granulocytes 0 %   Abs Immature Granulocytes 0.02 0.00 - 0.07 K/uL  Urinalysis, Routine w reflex microscopic -Urine, Clean Catch     Status: Abnormal   Collection Time: 01/31/23  9:55 PM  Result Value Ref Range   Color, Urine STRAW (A) YELLOW   APPearance CLEAR (A) CLEAR   Specific Gravity, Urine 1.005 1.005 - 1.030   pH 6.0 5.0 - 8.0   Glucose, UA 150 (A) NEGATIVE mg/dL   Hgb urine dipstick NEGATIVE NEGATIVE   Bilirubin Urine NEGATIVE NEGATIVE   Ketones, ur NEGATIVE NEGATIVE mg/dL   Protein, ur NEGATIVE NEGATIVE mg/dL   Nitrite NEGATIVE NEGATIVE   Leukocytes,Ua NEGATIVE NEGATIVE   Radiological Exams on Admission:  DG  Chest 2 View  Result Date: 01/31/2023 CLINICAL DATA:  Syncope, fall EXAM: CHEST - 2 VIEW COMPARISON:  05/20/2022 FINDINGS: Cardiomegaly. Platelike atelectasis in the left lower lobe. No confluent opacity on the right. No effusions or edema. No acute bony abnormality. IMPRESSION: Cardiomegaly. Left  retrocardiac atelectasis. No active disease. Electronically Signed   By: Charlett Nose M.D.   On: 01/31/2023 19:08   CT Cervical Spine Wo Contrast  Result Date: 01/31/2023 CLINICAL DATA:  Neck trauma (Age >= 65y) EXAM: CT CERVICAL SPINE WITHOUT CONTRAST TECHNIQUE: Multidetector CT imaging of the cervical spine was performed without intravenous contrast. Multiplanar CT image reconstructions were also generated. RADIATION DOSE REDUCTION: This exam was performed according to the departmental dose-optimization program which includes automated exposure control, adjustment of the mA and/or kV according to patient size and/or use of iterative reconstruction technique. COMPARISON:  05/20/2022 FINDINGS: Alignment: Normal Skull base and vertebrae: No acute fracture. No primary bone lesion or focal pathologic process. Soft tissues and spinal canal: No prevertebral fluid or swelling. No visible canal hematoma. Disc levels: Diffuse degenerative disc disease. Large flowing osteophytes. Diffuse degenerative facet disease bilaterally. Upper chest: No acute findings Other: None IMPRESSION: Degenerative disc and facet disease.  No acute bony abnormality. Electronically Signed   By: Charlett Nose M.D.   On: 01/31/2023 19:08   CT Head Wo Contrast  Result Date: 01/31/2023 CLINICAL DATA:  Syncope/presyncope, cerebrovascular cause suspected repeated syncope, fall, possibly hit head EXAM: CT HEAD WITHOUT CONTRAST TECHNIQUE: Contiguous axial images were obtained from the base of the skull through the vertex without intravenous contrast. RADIATION DOSE REDUCTION: This exam was performed according to the departmental dose-optimization program which includes automated exposure control, adjustment of the mA and/or kV according to patient size and/or use of iterative reconstruction technique. COMPARISON:  05/20/2022 FINDINGS: Brain: Old left occipital infarct, unchanged. No acute intracranial abnormality. Specifically, no hemorrhage,  hydrocephalus, mass lesion, acute infarction, or significant intracranial injury. Vascular: No hyperdense vessel or unexpected calcification. Skull: No acute calvarial abnormality. Sinuses/Orbits: No acute findings Other: None IMPRESSION: No acute intracranial abnormality. Electronically Signed   By: Charlett Nose M.D.   On: 01/31/2023 19:02   DG Hip Unilat W or Wo Pelvis 2-3 Views Right  Result Date: 01/31/2023 CLINICAL DATA:  Fall EXAM: DG HIP (WITH OR WITHOUT PELVIS) 2-3V RIGHT COMPARISON:  None. FINDINGS: The bones are osteopenic. No definite acute fracture or dislocation. Moderate degenerative changes affect the hips and lower lumbar spine. There are mild degenerative changes of the sacroiliac joints. IMPRESSION: 1. No definite acute fracture or dislocation. 2. Moderate degenerative changes of the hips and lower lumbar spine. Electronically Signed   By: Darliss Cheney M.D.   On: 01/31/2023 17:03    EKG: Independently reviewed. Paced rhythm.   Assessment and Plan: * Syncope and collapse Maintain the patient on telemetry, echo.  Trend troponin.  At this time clinical impression is that patient is having hypovolemic hypotension causing syncope and collapse.  Therefore I will check orthostatic hypotension.  Monitor response to IV hydration.  Patient is an already anticoagulated.  Therefore do not think VTE is a concern here.  I have requested evaluation by Dr. Mayford Knife of cardiology in the morning to kindly evaluate the pacemaker for interrogation.  Staff message sent to be delivered in the morning kindly follow-up  AKI (acute kidney injury) SPECT this is due to furosemide use.  Which may be too much for the patient I will reduce the dose to 20 mg daily.  Patient got normal saline bolus in the ER 500 cc.  I will maintain overnight hydration  Troponin level elevated Chronic, in the setting of creatinine elevation.  Nonspecific trend.  Patient is not having any chest pain  Atelectasis Incentive  spirometry ordered  Hyperbilirubinemia Without any associated LFT elevation, minimal, in the setting of recent falls and trauma.  Monitor clinically and trend  Loss of consciousness Due to syncope.  Concern for seizure episode at this time is low, monitor clinically.      Advance Care Planning:   Code Status: Full Code   Consults: I have sent a staff message to cardiology Dr. Mayford Knife to be delivered in the morning.  Please confirm in the morning that he is able to help [place] interrogate pacemaker thank you  Family Communication: per pateint  Severity of Illness: The appropriate patient status for this patient is INPATIENT. Inpatient status is judged to be reasonable and necessary in order to provide the required intensity of service to ensure the patient's safety. The patient's presenting symptoms, physical exam findings, and initial radiographic and laboratory data in the context of their chronic comorbidities is felt to place them at high risk for further clinical deterioration. Furthermore, it is not anticipated that the patient will be medically stable for discharge from the hospital within 2 midnights of admission.   * I certify that at the point of admission it is my clinical judgment that the patient will require inpatient hospital care spanning beyond 2 midnights from the point of admission due to high intensity of service, high risk for further deterioration and high frequency of surveillance required.*  Author: Nolberto Hanlon, MD 02/01/2023 12:00 AM  For on call review [email or web address].

## 2023-02-01 NOTE — Assessment & Plan Note (Signed)
Incentive spirometry ordered

## 2023-02-01 NOTE — H&P (Incomplete)
History and Physical    Patient: Andrea SmallDelores C Phillips ZOX:[id number] DOB: February 02, 1935 DOA: 01/31/2023 DOS: the patient was seen and examined on 02/01/2023 PCP: Margaretann LovelessKhan, Neelam S, MD  Patient coming from: Home  Chief Complaint:  Chief Complaint  Patient presents with  . Fall        HPI: Andrea SmallDelores C Sefcik is a 88 y.o. female with medical history significant of Atrial fibrillation/flutter as well as congestive heart failure who was in her usual state of health till about 10 days ago.  Since then patient reports at least 3 episodes of fall/loss of consciousness without any preceding vertigo focal weakness chest pain palpitation extreme sweating or feeling of warmth.  Patient denies any preceding vertigo.  Patient feels that she may pass out and then loses consciousness.  The latest episode happened today at approximately 1 PM and patient feels that the loss of consciousness was movement.  There was no tongue injury no incontinence.  None of these episodes have been witnessed.  After the fall today patient had some pain in her left hip and could not help help herself back up and crawled to the bed, contacted family and then [facility].  Patient denies any fever nausea vomiting diarrhea chest pain shortness of breath any change in urinary habits.  Patient at this time is feeling pretty good except for some soreness in her left hip otherwise no complaints Review of Systems: As mentioned in the history of present illness. All other systems reviewed and are negative. Past Medical History:  Diagnosis Date  . Arthritis    ra  . Asthma   . CHF (congestive heart failure)   . Dysrhythmia   . Edema extremities   . GERD (gastroesophageal reflux disease)   . Gout   . Headache   . History of hiatal hernia   . Hypertension   . Hypothyroidism   . Shortness of breath dyspnea   . Sleep apnea    Past Surgical History:  Procedure Laterality Date  . APPENDECTOMY    . BACK SURGERY    .  CARDIAC CATHETERIZATION    . CATARACT EXTRACTION W/PHACO Left 03/09/2015   Procedure: CATARACT EXTRACTION PHACO AND INTRAOCULAR LENS PLACEMENT (IOC);  Surgeon: Sallee LangeSteven Dingeldein, MD;  Location: [facility] ORS;  Service: Ophthalmology;  Laterality: Left;  US 01:31 AP% 26.1 CDE 43.72  . CATARACT EXTRACTION W/PHACO Right 08/19/2021   Procedure: CATARACT EXTRACTION PHACO AND INTRAOCULAR LENS PLACEMENT (IOC) RIGHT;  Surgeon: Galen ManilaPorfilio, William, MD;  Location: [facility] OR[facility];  Service: Ophthalmology;  Laterality: Right;  12.31 1:07.0  . CATARACT EXTRACTION W/PHACO Right 09/07/2021   Procedure: REMOVAL OF LENS FRAGMENTS RIGHT;  Surgeon: Galen ManilaPorfilio, William, MD;  [facility];  Service: Ophthalmology;  Laterality: Right;  . CHOLECYSTECTOMY    . CORONARY/GRAFT ACUTE MI REVASCULARIZATION N/A 10/24/2021   Procedure: Coronary/Graft Acute MI Revascularization;  Surgeon: Yvonne KendallEnd, Christopher, MD;  Location: [facility] INV[facility];  Service: Cardiovascular;  Laterality: N/A;  . EYE SURGERY     retina  . JOINT REPLACEMENT     left knee  . LEFT HEART CATH AND CORONARY ANGIOGRAPHY N/A 04/02/2021   Procedure: LEFT HEART CATH AND CORONARY ANGIOGRAPHY;  Surgeon: Lamar BlinksKowalski, Bruce J, MD;  Location: [facility];  Service: Cardiovascular;  Laterality: N/A;  . LEFT HEART CATH AND CORONARY ANGIOGRAPHY N/A 10/24/2021   Procedure: LEFT HEART CATH AND CORONARY ANGIOGRAPHY;  Surgeon: Yvonne KendallEnd, Christopher, MD;  Location: [facility];  Service: Cardiovascular;  Laterality: N/A;  . PACEMAKER LEADLESS INSERTION N/A 07/26/2022   Procedure: PACEMAKER LEADLESS INSERTION;  Surgeon: Marcina Millard, MD;  Location: [facility] LAB;  Service: Cardiovascular;  Laterality: N/A;   Social History:  reports that she has never smoked. She has never used smokeless tobacco. She reports that she does not drink alcohol and does not use drugs.  Allergies  Allergen Reactions  . Aspirin Hives  . Other Hives    Yellow dye 6 FOOD  COLOR YELLOW POWDER   . Atorvastatin Other (See Comments)    unknown  . Codeine Other (See Comments)    Doesn't know  . Conj Estrog-Medroxyprogest Ace Other (See Comments)    PREMPRO 0.3-1.5 MG ORAL TABLET unknown  . Prednisone Other (See Comments)    Chest pain  . Raloxifene Other (See Comments)    EVISTA 60 MG ORAL TABLET unknown  . Ace Inhibitors Cough  . Amlodipine Itching  . Valsartan Itching    Family History  Problem Relation Age of Onset  . Heart disease Mother   . Heart disease Father   . Breast cancer Cousin     Prior to Admission medications   Medication Sig Start Date End Date Taking? Authorizing Provider  albuterol (VENTOLIN HFA) 108 (90 Base) MCG/ACT inhaler Inhale 2 puffs into the lungs every 6 (six) hours as needed for wheezing or shortness of breath.   Yes [provider]  amiodarone (PACERONE) 200 MG tablet Take 1 tablet (200 mg total) by mouth daily. 02/12/22  Yes Arnetha Courser, MD  apixaban (ELIQUIS) 5 MG TABS tablet Take 1 tablet (5 mg total) by mouth 2 (two) times daily. 02/11/22  Yes Arnetha Courser, MD  cetirizine (ZYRTEC) 10 MG tablet Take 10 mg by mouth daily.   Yes [provider]  donepezil (ARICEPT) 5 MG tablet Take 5 mg by mouth daily. 11/24/22  Yes [provider]  empagliflozin (JARDIANCE) 10 MG TABS tablet Take 1 tablet by mouth daily. 01/17/23 01/17/24 Yes [provider]  fluticasone (FLONASE) 50 MCG/ACT nasal spray Place 1 spray into both nostrils 2 (two) times daily as needed for allergies or rhinitis.   Yes [provider]  furosemide (LASIX) 40 MG tablet Take 40 mg by mouth daily. 12/16/22  Yes [provider]  icosapent Ethyl (VASCEPA) 1 g capsule TAKE 2 CAPSULES BY MOUTH TWICE DAILY WITH MEALS 12/24/22  Yes Margaretann Loveless, MD  isosorbide mononitrate (IMDUR) 30 MG 24 hr tablet Take 30 mg by mouth daily. 04/07/22  Yes [provider]  levothyroxine (SYNTHROID) 112 MCG tablet Take 112 mcg by  mouth daily. 02/23/21  Yes [provider]  mometasone-formoterol (DULERA) 200-5 MCG/ACT AERO Inhale 2 puffs into the lungs 2 (two) times daily. 04/04/21  Yes Almon Hercules, MD  omeprazole (PRILOSEC) 40 MG capsule Take 40 mg by mouth daily. 05/16/22  Yes [provider]  potassium chloride (KLOR-CON M) 10 MEQ tablet Take 10 mEq by mouth 2 (two) times daily.   Yes [provider]  pravastatin (PRAVACHOL) 40 MG tablet Take 1 tablet (40 mg total) by mouth daily at 6 PM. 12/13/22  Yes Margaretann Loveless, MD  telmisartan (MICARDIS) 80 MG tablet Take 80 mg by mouth daily. 11/01/21  Yes [provider]  carvedilol (COREG) 12.5 MG tablet Take 12.5 mg by mouth 2 (two) times daily. Patient not taking: Reported on 01/31/2023 09/16/22   [provider]    Physical Exam: Vitals:   01/31/23 1625 01/31/23  2315  BP: (!) 153/77 (!) 141/70  Pulse: 61 60  Resp: 18 18  Temp: 98.2 F (36.8 C) 98.1 F (36.7 C)  TempSrc: Oral Oral  SpO2: 96% 98%   General: Alert awake oriented x 3 no focal motor deficit Respiratory exam: Bilateral intravesicular Cardiovascular exam S1-S2 normal Abdomen soft nontender Extremities warm without edema Musculoskeletal exam no focal spinal tenderness bilateral 5/5 strength in all 4 extremities Trace icterus Data Reviewed: {Tip this will not be part of the note when signed- Document your independent interpretation of telemetry tracing, EKG, lab, Radiology test or any other diagnostic tests. Add any new diagnostic test ordered today. (Optional):26781} {Results:26384}  Assessment and Plan: No notes have been filed under this hospital service. Service: Hospitalist     Advance Care Planning:   Code Status: Full Code ***  Consults: ***  Family Communication: ***  Severity of Illness: {Observation/Inpatient:21159}  Author: Nolberto Hanlon, MD 02/01/2023 12:00 AM  For on call review [email or web address].

## 2023-02-01 NOTE — Progress Notes (Addendum)
Progress Note    Andrea Phillips  ESP:[id number] DOB: 27-Feb-1935  DOA: 01/31/2023 PCP: Margaretann Loveless, MD      Brief Narrative:    Medical records reviewed and are as summarized below:  Andrea Phillips is a 88 y.o. female with medical history significant for chronic systolic CHF (EF 25 to 30%), permanent atrial fibrillation/atrial flutter, sick sinus syndrome s/p Micra admitted placed pacemaker implantation on 07/26/2022, CAD s/p left heart cath in July 2023 with occluded distal OM 3 (too small for PCI) history of stroke in April 2023, hypothyroidism, who presented to the hospital with syncope and multiple falls.  She said she passed out in the bathroom 3 days prior to admission and passed out again in the kitchen on the day of admission.      Assessment/Plan:   Principal Problem:   Syncope and collapse Active Problems:   AKI (acute kidney injury)   Troponin level elevated   Hypokalemia   Loss of consciousness   Hyperbilirubinemia   Atelectasis   Nutrition Problem: Inadequate oral intake Etiology: decreased appetite  Signs/Symptoms: per patient/family report   Body mass index is 28.72 kg/m.   Acute kidney injury: Telmisartan and Lasix have been held.  Change IV fluids from D5 Ringer's lactate to Ringer's lactate infusion and decrease rate from 75 mL/h to 50 mL/h.  Creatinine has gone from 1.83-1.89.  Monitor BMP. Creatinine was 1.3 on 11/18/2022.   Hypokalemia: Improving.  Continue potassium repletion   S/p syncope, multiple falls: She noticed this after starting Jardiance.  [place] Pepper has been discontinued.  Pacemaker interrogation was normal per cardiology.   Chronic systolic CHF, valvular heart disease (moderate MR, moderate TR): Complains of tach.  Lasix has been held.  EF 25 to 30% on 2D echo in February 2024.   Permanent atrial fibrillation: Continue Eliquis and amiodarone   CAD: Continue Eliquis and isosorbide mononitrate.    Diet Order              Diet regular Room service appropriate? Yes; Fluid consistency: Thin  Diet effective now                            Consultants: Cardiologist  Procedures: None    Medications:    amiodarone  200 mg Oral Daily   apixaban  2.5 mg Oral BID   donepezil  5 mg Oral Daily   feeding supplement  237 mL Oral BID BM   icosapent Ethyl  2 g Oral BID   isosorbide mononitrate  30 mg Oral Daily   levothyroxine  112 mcg Oral Q0600   mometasone-formoterol  2 puff Inhalation BID   multivitamin with minerals  1 tablet Oral Daily   pantoprazole  40 mg Oral Daily   potassium chloride  20 mEq Oral BID   pravastatin  40 mg Oral q1800   sodium chloride flush  3 mL Intravenous Q12H   Continuous Infusions:  lactated ringers       Anti-infectives (From admission, onward)    None              Family Communication/Anticipated D/C date and plan/Code Status   DVT prophylaxis: apixaban (ELIQUIS) tablet 2.5 mg Start: 02/01/23 0500 SCDs Start: 01/31/23 2359 apixaban (ELIQUIS) tablet 2.5 mg     Code Status: Full Code  Family Communication: None Disposition Plan: Plan to discharge home tomorrow   Status is: Inpatient Remains inpatient appropriate because: AKI,  hypokalemia       Subjective:   Interval events noted.  No chest pain, shortness of breath, dizziness or palpitations.  Objective:    Vitals:   02/01/23 0315 02/01/23 0511 02/01/23 0734 02/01/23 1133  BP: (!) 169/80 (!) 160/65 (!) 152/79 133/74  Pulse: (!) 59 (!) 59 (!) 59 (!) 59  Resp: 16 20 17 18   Temp: 98.2 F (36.8 C) 98.9 F (37.2 C) 98.3 F (36.8 C) 98.2 F (36.8 C)  TempSrc: Oral     SpO2: 97% 95% 94% 93%  Weight:      Height:       No data found.   Intake/Output Summary (Last 24 hours) at 02/01/2023 1515 Last data filed at 02/01/2023 1423 Gross per 24 hour  Intake 1762.01 ml  Output --  Net 1762.01 ml   Filed Weights   02/01/23 0033  Weight: 68.9 kg     Exam:  GEN: NAD SKIN: Warm and dry EYES: No pallor or icterus ENT: MMM CV: RRR PULM: CTA B ABD: soft, ND, NT, +BS CNS: AAO x 3, non focal EXT: No edema or tenderness        Data Reviewed:   I have personally reviewed following labs and imaging studies:  Labs: Labs show the following:   Basic Metabolic Panel: Recent Labs  Lab 01/31/23 2049 02/01/23 0023 02/01/23 0415 02/01/23 1152  NA 142  --  146* 143  K 2.7*   < > 2.8* 3.4*  CL 103  --  108 107  CO2 30  --  31 29  GLUCOSE 97  --  108* 113*  BUN 20  --  19 18  CREATININE 1.83*  --  1.82* 1.89*  CALCIUM 9.2  --  9.1 9.0  MG 2.3  --   --  2.1   < > = values in this interval not displayed.   GFR Estimated Creatinine Clearance: 19 mL/min (A) (by C-G formula based on SCr of 1.89 mg/dL (H)). Liver Function Tests: Recent Labs  Lab 01/31/23 2049  AST 26  ALT 17  ALKPHOS 69  BILITOT 1.3*  PROT 6.3*  ALBUMIN 3.0*   No results for input(s): "LIPASE", "AMYLASE" in the last 168 hours. No results for input(s): "AMMONIA" in the last 168 hours. Coagulation profile No results for input(s): "INR", "PROTIME" in the last 168 hours.  CBC: Recent Labs  Lab 01/31/23 2049 02/01/23 0415  WBC 6.0 5.9  NEUTROABS 4.1  --   HGB 12.3 12.4  HCT 38.6 38.2  MCV 91.0 89.7  PLT 229 212   Cardiac Enzymes: No results for input(s): "CKTOTAL", "CKMB", "CKMBINDEX", "TROPONINI" in the last 168 hours. BNP (last 3 results) No results for input(s): "PROBNP" in the last 8760 hours. CBG: No results for input(s): "GLUCAP" in the last 168 hours. D-Dimer: No results for input(s): "DDIMER" in the last 72 hours. Hgb A1c: No results for input(s): "HGBA1C" in the last 72 hours. Lipid Profile: No results for input(s): "CHOL", "HDL", "LDLCALC", "TRIG", "CHOLHDL", "LDLDIRECT" in the last 72 hours. Thyroid function studies: No results for input(s): "TSH", "T4TOTAL", "T3FREE", "THYROIDAB" in the last 72 hours.  Invalid input(s):  "FREET3" Anemia work up: No results for input(s): "VITAMINB12", "FOLATE", "FERRITIN", "TIBC", "IRON", "RETICCTPCT" in the last 72 hours. Sepsis Labs: Recent Labs  Lab 01/31/23 2049 02/01/23 0415  WBC 6.0 5.9    Microbiology No results found for this or any previous visit (from the past 240 hour(s)).  Procedures and diagnostic studies:  DG Chest 2 View  Result Date: 01/31/2023 CLINICAL DATA:  Syncope, fall EXAM: CHEST - 2 VIEW COMPARISON:  05/20/2022 FINDINGS: Cardiomegaly. Platelike atelectasis in the left lower lobe. No confluent opacity on the right. No effusions or edema. No acute bony abnormality. IMPRESSION: Cardiomegaly. Left retrocardiac atelectasis. No active disease. Electronically Signed   By: Charlett Nose M.D.   On: 01/31/2023 19:08   CT Cervical Spine Wo Contrast  Result Date: 01/31/2023 CLINICAL DATA:  Neck trauma (Age >= 65y) EXAM: CT CERVICAL SPINE WITHOUT CONTRAST TECHNIQUE: Multidetector CT imaging of the cervical spine was performed without intravenous contrast. Multiplanar CT image reconstructions were also generated. RADIATION DOSE REDUCTION: This exam was performed according to the departmental dose-optimization program which includes automated exposure control, adjustment of the mA and/or kV according to patient size and/or use of iterative reconstruction technique. COMPARISON:  05/20/2022 FINDINGS: Alignment: Normal Skull base and vertebrae: No acute fracture. No primary bone lesion or focal pathologic process. Soft tissues and spinal canal: No prevertebral fluid or swelling. No visible canal hematoma. Disc levels: Diffuse degenerative disc disease. Large flowing osteophytes. Diffuse degenerative facet disease bilaterally. Upper chest: No acute findings Other: None IMPRESSION: Degenerative disc and facet disease.  No acute bony abnormality. Electronically Signed   By: Charlett Nose M.D.   On: 01/31/2023 19:08   CT Head Wo Contrast  Result Date: 01/31/2023 CLINICAL DATA:   Syncope/presyncope, cerebrovascular cause suspected repeated syncope, fall, possibly hit head EXAM: CT HEAD WITHOUT CONTRAST TECHNIQUE: Contiguous axial images were obtained from the base of the skull through the vertex without intravenous contrast. RADIATION DOSE REDUCTION: This exam was performed according to the departmental dose-optimization program which includes automated exposure control, adjustment of the mA and/or kV according to patient size and/or use of iterative reconstruction technique. COMPARISON:  05/20/2022 FINDINGS: Brain: Old left occipital infarct, unchanged. No acute intracranial abnormality. Specifically, no hemorrhage, hydrocephalus, mass lesion, acute infarction, or significant intracranial injury. Vascular: No hyperdense vessel or unexpected calcification. Skull: No acute calvarial abnormality. Sinuses/Orbits: No acute findings Other: None IMPRESSION: No acute intracranial abnormality. Electronically Signed   By: Charlett Nose M.D.   On: 01/31/2023 19:02   DG Hip Unilat W or Wo Pelvis 2-3 Views Right  Result Date: 01/31/2023 CLINICAL DATA:  Fall EXAM: DG HIP (WITH OR WITHOUT PELVIS) 2-3V RIGHT COMPARISON:  None. FINDINGS: The bones are osteopenic. No definite acute fracture or dislocation. Moderate degenerative changes affect the hips and lower lumbar spine. There are mild degenerative changes of the sacroiliac joints. IMPRESSION: 1. No definite acute fracture or dislocation. 2. Moderate degenerative changes of the hips and lower lumbar spine. Electronically Signed   By: Darliss Cheney M.D.   On: 01/31/2023 17:03               LOS: 1 day   Sherald Balbuena  [facility]   Pager on [email or web address]. If 7PM-7AM, please contact night-coverage at [email or web address]     02/01/2023, 3:15 PM

## 2023-02-01 NOTE — Care Management CC44 (Signed)
Condition Code 44 Documentation Completed  Patient Details  Name: Andrea Phillips MRN: [id number] Date of Birth: Aug 03, 1935   Condition Code 44 given:  Yes Patient signature on Condition Code 44 notice:  Yes Documentation of 2 MD's agreement:  Yes Code 44 added to claim:  Yes    Allena Katz, LCSW 02/01/2023, 3:38 PM

## 2023-02-01 NOTE — Progress Notes (Signed)
Mobility Specialist - Progress Note   02/01/23 1424  Mobility  Activity Ambulated with assistance in room;Ambulated with assistance to bathroom  Level of Assistance Standby assist, set-up cues, supervision of patient - no hands on  Assistive Device Front wheel walker  Distance Ambulated (ft) 15 ft  Activity Response Tolerated well  Mobility Referral Yes  $Mobility charge 1 Mobility   Pt supine in bed on RA upon arrival. Pt completes bed mobility with HHA. Pt STS and ambulates to/from bathroom SBA with no LOB noted. Pt left EOB with needs in reach and bed alarm on.   Terrilyn Saver  Mobility Specialist  02/01/23 2:26 PM

## 2023-02-01 NOTE — Assessment & Plan Note (Signed)
SPECT this is due to furosemide use.  Which may be too much for the patient I will reduce the dose to 20 mg daily.  Patient got normal saline bolus in the ER 500 cc.  I will maintain overnight hydration

## 2023-02-01 NOTE — Assessment & Plan Note (Signed)
Chronic, in the setting of creatinine elevation.  Nonspecific trend.  Patient is not having any chest pain

## 2023-02-01 NOTE — Progress Notes (Signed)
Initial Nutrition Assessment  DOCUMENTATION CODES:   Not applicable  INTERVENTION:   -Continue with regular diet -MVI with minerals daily -Ensure Enlive po BID, each supplement provides 350 kcal and 20 grams of protein.   NUTRITION DIAGNOSIS:   Inadequate oral intake related to decreased appetite as evidenced by per patient/family report.  GOAL:   Patient will meet greater than or equal to 90% of their needs  MONITOR:   PO intake, Supplement acceptance  REASON FOR ASSESSMENT:   Malnutrition Screening Tool    ASSESSMENT:   Pt with medical history significant of Atrial fibrillation/flutter as well as congestive heart failure who was in her usual state of health till about 10 days ago PTA.  Since then patient reports at least 3 episodes of fall/loss of consciousness without any preceding vertigo focal weakness chest pain palpitation extreme sweating or feeling of warmth.  Pt admitted with syncope and collapse.   Reviewed I/O's: +1 L x 24 hours   Spoke with pt at bedside, who was pleasant and in good spirits today. Pt reports poor appetite over the past few months, but still tries to eat. Pt typically consumes 3 meals per day (Breakfast: scrambled eggs and toast; Lunch: sandwich: Dinner: meat, starch, and vegetable). Pt shares that she lives with her granddaughter and family and is often given noodles for dinner, which she does not prefer. Although she does not feel hungry, she always tries to eat something. Pt consumed all of the vegetables in her omelette and about 1/4 of the eggs.   Pt reports that she has has been experiencing fall over the past year. Pt shares that they have been more frequent over the past month and recently discontinued jardiance, which she feels was contributing to the falls.   Pt denies any weight loss. Reviewed wt hx; wt has been stable over the past 10 months.   Discussed importance of good meal and supplement intake to promote healing. Pt amenable  supplements, which she reports she has taken in the past, but not consistently.   Medications reviewed and include potassium chloride and dextrose 5% in lactated ringers infusion @ 75 ml/hr.   Labs reviewed: K: 3.4.    NUTRITION - FOCUSED PHYSICAL EXAM:  Flowsheet Row Most Recent Value  Orbital Region No depletion  Upper Arm Region Mild depletion  Thoracic and Lumbar Region No depletion  Buccal Region No depletion  Temple Region No depletion  Clavicle Bone Region No depletion  Clavicle and Acromion Bone Region No depletion  Scapular Bone Region No depletion  Patellar Region Mild depletion  Anterior Thigh Region Mild depletion  Posterior Calf Region Mild depletion  Edema (RD Assessment) Mild  Hair Reviewed  Eyes Reviewed  Mouth Reviewed  Skin Reviewed  Nails Reviewed       Diet Order:   Diet Order             Diet regular Room service appropriate? Yes; Fluid consistency: Thin  Diet effective now                   EDUCATION NEEDS:   Education needs have been addressed  Skin:  Skin Assessment: Reviewed RN Assessment  Last BM:  Unknown  Height:   Ht Readings from Last 1 Encounters:  02/01/23 5\' 1"  (1.549 m)    Weight:   Wt Readings from Last 1 Encounters:  02/01/23 68.9 kg    Ideal Body Weight:  47.7 kg  BMI:  Body mass index is 28.72 kg/m.  Estimated Nutritional Needs:   Kcal:  1500-1700  Protein:  70-85 grams  Fluid:  > 1.5 L    Levada Schilling, RD, LDN, CDCES Registered Dietitian II Certified Diabetes Care and Education Specialist Please refer to [facility] for RD and/or RD on-call/weekend/after hours pager

## 2023-02-01 NOTE — Assessment & Plan Note (Signed)
Without any associated LFT elevation, minimal, in the setting of recent falls and trauma.  Monitor clinically and trend

## 2023-02-02 DIAGNOSIS — N179 Acute kidney failure, unspecified: Secondary | ICD-10-CM | POA: Diagnosis not present

## 2023-02-02 DIAGNOSIS — R55 Syncope and collapse: Secondary | ICD-10-CM | POA: Diagnosis not present

## 2023-02-02 LAB — THYROID PANEL WITH TSH
Free Thyroxine Index: 4.7 (ref 1.2–4.9)
T3 Uptake Ratio: 34 % (ref 24–39)
T4, Total: 13.9 ug/dL — ABNORMAL HIGH (ref 4.5–12.0)
TSH: 8.17 u[IU]/mL — ABNORMAL HIGH (ref 0.450–4.500)

## 2023-02-02 LAB — BASIC METABOLIC PANEL
Anion gap: 6 (ref 5–15)
BUN: 19 mg/dL (ref 8–23)
CO2: 31 mmol/L (ref 22–32)
Calcium: 9.5 mg/dL (ref 8.9–10.3)
Chloride: 106 mmol/L (ref 98–111)
Creatinine, Ser: 1.77 mg/dL — ABNORMAL HIGH (ref 0.44–1.00)
GFR, Estimated: 28 mL/min — ABNORMAL LOW (ref >60)
Glucose, Bld: 113 mg/dL — ABNORMAL HIGH (ref 70–99)
Potassium: 4.4 mmol/L (ref 3.5–5.1)
Sodium: 143 mmol/L (ref 135–145)

## 2023-02-02 MED ORDER — LACTATED RINGERS IV SOLN: INTRAVENOUS | Status: DC

## 2023-02-02 MED ORDER — POLYETHYLENE GLYCOL 3350 17 G PO PACK
17.0000 g | PACK | Freq: Every day | ORAL | Status: DC
  Administered 2023-02-02 – 2023-02-03 (×2): 17 g via ORAL
  Filled 2023-02-02 (×2): qty 1

## 2023-02-02 MED ORDER — IRBESARTAN 150 MG PO TABS
75.0000 mg | ORAL_TABLET | Freq: Every day | ORAL | Status: DC
  Administered 2023-02-02 – 2023-02-03 (×2): 75 mg via ORAL
  Filled 2023-02-02 (×2): qty 1

## 2023-02-02 MED ORDER — LACTATED RINGERS IV SOLN: INTRAVENOUS | Status: AC

## 2023-02-02 MED ORDER — SENNOSIDES-DOCUSATE SODIUM 8.6-50 MG PO TABS: 1.0000 | ORAL_TABLET | Freq: Every evening | ORAL | Status: DC | PRN

## 2023-02-02 MED ORDER — HYDRALAZINE HCL 50 MG PO TABS
25.0000 mg | ORAL_TABLET | Freq: Three times a day (TID) | ORAL | Status: AC
  Administered 2023-02-02 – 2023-02-03 (×2): 25 mg via ORAL
  Filled 2023-02-02 (×2): qty 1

## 2023-02-02 MED ORDER — ISOSORBIDE MONONITRATE ER 30 MG PO TB24
60.0000 mg | ORAL_TABLET | Freq: Every day | ORAL | Status: DC
  Administered 2023-02-03: 60 mg via ORAL
  Filled 2023-02-02: qty 2

## 2023-02-02 NOTE — Progress Notes (Signed)
Mobility Specialist - Progress Note   02/02/23 1035  Mobility  Activity Ambulated with assistance in hallway;Stood at bedside;Dangled on edge of bed  Level of Assistance Standby assist, set-up cues, supervision of patient - no hands on  Assistive Device Front wheel walker  Distance Ambulated (ft) 200 ft  Activity Response Tolerated well  Mobility Referral Yes  $Mobility charge 1 Mobility   Pt supine in bed on RA upon arrival. Pt STS and ambulates in hallway SBA. Pt left in bathroom with RN present.   Terrilyn Saver  Mobility Specialist  02/02/23 10:36 AM

## 2023-02-02 NOTE — Progress Notes (Signed)
Progress Note    Andrea SmallDelores C Bibb  WUJ:[id number] DOB: Sep 15, 1935  DOA: 01/31/2023 PCP: Margaretann LovelessKhan, Neelam S, MD      Brief Narrative:    Medical records reviewed and are as summarized below:  Andrea Phillips is a [age] y.o. female with medical history significant for chronic systolic CHF (EF 25 to 30%), permanent atrial fibrillation/atrial flutter, sick sinus syndrome s/p Micra admitted placed pacemaker implantation on 07/26/2022, CAD s/p left heart cath in July 2023 with occluded distal OM 3 (too small for PCI) history of stroke in April 2023, hypothyroidism, who presented to the hospital with syncope and multiple falls.  She said she passed out in the bathroom 3 days prior to admission and passed out again in the kitchen on the day of admission.      Assessment/Plan:   Principal Problem:   Syncope and collapse Active Problems:   AKI (acute kidney injury)   Troponin level elevated   Hypokalemia   Loss of consciousness   Hyperbilirubinemia   Atelectasis   Nutrition Problem: Inadequate oral intake Etiology: decreased appetite  Signs/Symptoms: per patient/family report   Body mass index is 28.7 kg/m.   Acute kidney injury: Telmisartan and Lasix have been held.  Creatinine is down from 1.9-1.77.  Continue Ringer's lactate infusion at 50 mL/h.  Monitor BMP. Creatinine was 1.3 on 11/18/2022.   Hypokalemia: Improved.   S/p syncope, multiple falls: She noticed this after starting Jardiance.  [place] PepperJardiance has been discontinued.  Pacemaker interrogation was normal per cardiology.   Chronic systolic CHF, valvular heart disease (moderate MR, moderate TR): Complains of tach.  Lasix has been held.  EF 25 to 30% on 2D echo in February 2024.   Permanent atrial fibrillation: Continue Eliquis and amiodarone   CAD: Continue Eliquis and isosorbide mononitrate.   Hypertension: BP is elevated.  Continue isosorbide mononitrate.  Add hydralazine for now.  Telmisartan on  hold.    Diet Order             Diet regular Room service appropriate? Yes; Fluid consistency: Thin  Diet effective now                            Consultants: Cardiologist  Procedures: None    Medications:    amiodarone  200 mg Oral Daily   apixaban  2.5 mg Oral BID   donepezil  5 mg Oral Daily   feeding supplement  237 mL Oral BID BM   icosapent Ethyl  2 g Oral BID   irbesartan  75 mg Oral Daily   [START ON 02/03/2023] isosorbide mononitrate  60 mg Oral Daily   levothyroxine  112 mcg Oral Q0600   mometasone-formoterol  2 puff Inhalation BID   multivitamin with minerals  1 tablet Oral Daily   pantoprazole  40 mg Oral Daily   polyethylene glycol  17 g Oral Daily   pravastatin  40 mg Oral q1800   sodium chloride flush  3 mL Intravenous Q12H   Continuous Infusions:  lactated ringers 50 mL/hr at 02/02/23 0842     Anti-infectives (From admission, onward)    None              Family Communication/Anticipated D/C date and plan/Code Status   DVT prophylaxis: apixaban (ELIQUIS) tablet 2.5 mg Start: 02/01/23 0500 SCDs Start: 01/31/23 2359 apixaban (ELIQUIS) tablet 2.5 mg     Code Status: Full Code  Family Communication: None Disposition  Plan: Plan to discharge home tomorrow   Status is: Inpatient Remains inpatient appropriate because: AKI, hypokalemia       Subjective:   Interval events noted.  She has adequate urine output.  No complaints.  No shortness of breath, chest pain or dizziness.  Objective:    Vitals:   02/02/23 0441 02/02/23 0820 02/02/23 1100 02/02/23 1144  BP: (!) 176/92 (!) 175/103 (!) 168/95 (!) 170/83  Pulse: (!) 59 60 (!) 59 66  Resp:  16 18 18   Temp:  97.9 F (36.6 C) 99.6 F (37.6 C) 97.9 F (36.6 C)  TempSrc:  Oral Oral   SpO2:  93% 96% 94%  Weight:   68.9 kg   Height:   5\' 1"  (1.549 m)    No data found.   Intake/Output Summary (Last 24 hours) at 02/02/2023 1536 Last data filed at 02/02/2023  1043 Gross per 24 hour  Intake 1373.15 ml  Output --  Net 1373.15 ml   Filed Weights   02/01/23 0033 02/02/23 1100  Weight: 68.9 kg 68.9 kg    Exam:  GEN: NAD SKIN: No rash EYES: EOMI ENT: MMM CV: RRR PULM: CTA B ABD: soft, ND, NT, +BS CNS: AAO x 3, non focal EXT: No edema or tenderness    Data Reviewed:   I have personally reviewed following labs and imaging studies:  Labs: Labs show the following:   Basic Metabolic Panel: Recent Labs  Lab 01/31/23 2049 02/01/23 0023 02/01/23 0415 02/01/23 1152 02/02/23 0414  NA 142  --  146* 143 143  K 2.7*   < > 2.8* 3.4* 4.4  CL 103  --  108 107 106  CO2 30  --  31 29 31   GLUCOSE 97  --  108* 113* 113*  BUN 20  --  19 18 19   CREATININE 1.83*  --  1.82* 1.89* 1.77*  CALCIUM 9.2  --  9.1 9.0 9.5  MG 2.3  --   --  2.1  --    < > = values in this interval not displayed.   GFR Estimated Creatinine Clearance: 20.2 mL/min (A) (by C-G formula based on SCr of 1.77 mg/dL (H)). Liver Function Tests: Recent Labs  Lab 01/31/23 2049  AST 26  ALT 17  ALKPHOS 69  BILITOT 1.3*  PROT 6.3*  ALBUMIN 3.0*   No results for input(s): "LIPASE", "AMYLASE" in the last 168 hours. No results for input(s): "AMMONIA" in the last 168 hours. Coagulation profile No results for input(s): "INR", "PROTIME" in the last 168 hours.  CBC: Recent Labs  Lab 01/31/23 2049 02/01/23 0415  WBC 6.0 5.9  NEUTROABS 4.1  --   HGB 12.3 12.4  HCT 38.6 38.2  MCV 91.0 89.7  PLT 229 212   Cardiac Enzymes: No results for input(s): "CKTOTAL", "CKMB", "CKMBINDEX", "TROPONINI" in the last 168 hours. BNP (last 3 results) No results for input(s): "PROBNP" in the last 8760 hours. CBG: No results for input(s): "GLUCAP" in the last 168 hours. D-Dimer: No results for input(s): "DDIMER" in the last 72 hours. Hgb A1c: No results for input(s): "HGBA1C" in the last 72 hours. Lipid Profile: No results for input(s): "CHOL", "HDL", "LDLCALC", "TRIG",  "CHOLHDL", "LDLDIRECT" in the last 72 hours. Thyroid function studies: Recent Labs    02/01/23 0415  TSH 8.170*  T4TOTAL 13.9*   Anemia work up: No results for input(s): "VITAMINB12", "FOLATE", "FERRITIN", "TIBC", "IRON", "RETICCTPCT" in the last 72 hours. Sepsis Labs: Recent Labs  Lab 01/31/23  2049 02/01/23 0415  WBC 6.0 5.9    Microbiology No results found for this or any previous visit (from the past 240 hour(s)).  Procedures and diagnostic studies:  DG Chest 2 View  Result Date: 01/31/2023 CLINICAL DATA:  Syncope, fall EXAM: CHEST - 2 VIEW COMPARISON:  05/20/2022 FINDINGS: Cardiomegaly. Platelike atelectasis in the left lower lobe. No confluent opacity on the right. No effusions or edema. No acute bony abnormality. IMPRESSION: Cardiomegaly. Left retrocardiac atelectasis. No active disease. Electronically Signed   By: Charlett Nose M.D.   On: 01/31/2023 19:08   CT Cervical Spine Wo Contrast  Result Date: 01/31/2023 CLINICAL DATA:  Neck trauma (Age >= 65y) EXAM: CT CERVICAL SPINE WITHOUT CONTRAST TECHNIQUE: Multidetector CT imaging of the cervical spine was performed without intravenous contrast. Multiplanar CT image reconstructions were also generated. RADIATION DOSE REDUCTION: This exam was performed according to the departmental dose-optimization program which includes automated exposure control, adjustment of the mA and/or kV according to patient size and/or use of iterative reconstruction technique. COMPARISON:  05/20/2022 FINDINGS: Alignment: Normal Skull base and vertebrae: No acute fracture. No primary bone lesion or focal pathologic process. Soft tissues and spinal canal: No prevertebral fluid or swelling. No visible canal hematoma. Disc levels: Diffuse degenerative disc disease. Large flowing osteophytes. Diffuse degenerative facet disease bilaterally. Upper chest: No acute findings Other: None IMPRESSION: Degenerative disc and facet disease.  No acute bony abnormality.  Electronically Signed   By: Charlett Nose M.D.   On: 01/31/2023 19:08   CT Head Wo Contrast  Result Date: 01/31/2023 CLINICAL DATA:  Syncope/presyncope, cerebrovascular cause suspected repeated syncope, fall, possibly hit head EXAM: CT HEAD WITHOUT CONTRAST TECHNIQUE: Contiguous axial images were obtained from the base of the skull through the vertex without intravenous contrast. RADIATION DOSE REDUCTION: This exam was performed according to the departmental dose-optimization program which includes automated exposure control, adjustment of the mA and/or kV according to patient size and/or use of iterative reconstruction technique. COMPARISON:  05/20/2022 FINDINGS: Brain: Old left occipital infarct, unchanged. No acute intracranial abnormality. Specifically, no hemorrhage, hydrocephalus, mass lesion, acute infarction, or significant intracranial injury. Vascular: No hyperdense vessel or unexpected calcification. Skull: No acute calvarial abnormality. Sinuses/Orbits: No acute findings Other: None IMPRESSION: No acute intracranial abnormality. Electronically Signed   By: Charlett Nose M.D.   On: 01/31/2023 19:02   DG Hip Unilat W or Wo Pelvis 2-3 Views Right  Result Date: 01/31/2023 CLINICAL DATA:  Fall EXAM: DG HIP (WITH OR WITHOUT PELVIS) 2-3V RIGHT COMPARISON:  None. FINDINGS: The bones are osteopenic. No definite acute fracture or dislocation. Moderate degenerative changes affect the hips and lower lumbar spine. There are mild degenerative changes of the sacroiliac joints. IMPRESSION: 1. No definite acute fracture or dislocation. 2. Moderate degenerative changes of the hips and lower lumbar spine. Electronically Signed   By: Darliss Cheney M.D.   On: 01/31/2023 17:03               LOS: 1 day   Traxton Kolenda  [facility]   Pager on [email or web address]. If 7PM-7AM, please contact night-coverage at [email or web address]     02/02/2023, 3:36 PM

## 2023-02-02 NOTE — Progress Notes (Addendum)
[facility] CLINIC CARDIOLOGY CONSULT NOTE       Patient ID: Andrea Phillips MRN: [id number] DOB/AGE: February 15, 1935 88 y.o.  Admit date: 01/31/2023 Referring Physician Dr. Lurene Shadow  Primary Physician  Primary Cardiologist Minda Ditto, PA-C  Reason for Consultation syncope   HPI: Andrea Phillips. Bevans is an 11yoF with a PMH of chronic HFrEF (EF 25-30% with moderate MR, TR, trivial AR 11/29/2022), permanent atrial fibrillation (amiodarone, Eliquis), SSS s/p Micra leadless pacemaker implantation (07/26/2022), CAD s/p LHC 10/24/21 with an occluded distal OM 3 (too small for PCI), 20% stenosed LAD, Hx CVA (01/2022), hypothyroidism who presented to [facility] ED/06/2023 after multiple falls at home for which cardiology is consulted for further assistance.  Interval History:  - no acute events - BP elevated this AM.  - reports 2-3/10 chest pain but does not remember when it started. Feels like pressure "on the bone" (tip of sternum). Reproducible to palpation. Not associated with movement, nausea, diaphoresis, no shortness of breath. Declines SL nitro. Accepts tylenol.  - still V pacing on tele 60 BPM without arrhythmias.   Review of systems complete and found to be negative unless listed above     Past Medical History:  Diagnosis Date   Arthritis    ra   Asthma    CHF (congestive heart failure)    Dysrhythmia    Edema extremities    GERD (gastroesophageal reflux disease)    Gout    Headache    History of hiatal hernia    Hypertension    Hypothyroidism    Shortness of breath dyspnea    Sleep apnea     Past Surgical History:  Procedure Laterality Date   APPENDECTOMY     BACK SURGERY     CARDIAC CATHETERIZATION     CATARACT EXTRACTION W/PHACO Left 03/09/2015   Procedure: CATARACT EXTRACTION PHACO AND INTRAOCULAR LENS PLACEMENT (IOC);  Surgeon: Sallee Lange, MD;  Location: [facility] ORS;  Service: Ophthalmology;  Laterality: Left;  [place] 01:31 AP% 26.1 CDE 43.72   CATARACT EXTRACTION  W/PHACO Right 08/19/2021   Procedure: CATARACT EXTRACTION PHACO AND INTRAOCULAR LENS PLACEMENT (IOC) RIGHT;  Surgeon: Galen Manila, MD;  Location: [facility] ORS;  Service: Ophthalmology;  Laterality: Right;  12.31 1:07.0   CATARACT EXTRACTION W/PHACO Right 09/07/2021   Procedure: REMOVAL OF LENS FRAGMENTS RIGHT;  Surgeon: Galen Manila, MD;  Location: [facility];  Service: Ophthalmology;  Laterality: Right;   CHOLECYSTECTOMY     CORONARY/GRAFT ACUTE MI REVASCULARIZATION N/A 10/24/2021   Procedure: Coronary/Graft Acute MI Revascularization;  Surgeon: Yvonne Kendall, MD;  Location: [facility] INVASIVE CV LAB;  Service: Cardiovascular;  Laterality: N/A;   EYE SURGERY     retina   JOINT REPLACEMENT     left knee   LEFT HEART CATH AND CORONARY ANGIOGRAPHY N/A 04/02/2021   Procedure: LEFT HEART CATH AND CORONARY ANGIOGRAPHY;  Surgeon: Lamar Blinks, MD;  Location: [facility] INVASIVE CV LAB;  Service: Cardiovascular;  Laterality: N/A;   LEFT HEART CATH AND CORONARY ANGIOGRAPHY N/A 10/24/2021   Procedure: LEFT HEART CATH AND CORONARY ANGIOGRAPHY;  Surgeon: Yvonne Kendall, MD;  Location: [facility] INVASIVE CV LAB;  Service: Cardiovascular;  Laterality: N/A;   PACEMAKER LEADLESS INSERTION N/A 07/26/2022   Procedure: PACEMAKER LEADLESS INSERTION;  Surgeon: Marcina Millard, MD;  Location: [facility] INVASIVE CV LAB;  Service: Cardiovascular;  Laterality: N/A;    Medications Prior to Admission  Medication Sig Dispense Refill Last Dose   albuterol (VENTOLIN HFA) 108 (90 Base) MCG/ACT inhaler Inhale 2 puffs into  the lungs every 6 (six) hours as needed for wheezing or shortness of breath.   unk at unk   amiodarone (PACERONE) 200 MG tablet Take 1 tablet (200 mg total) by mouth daily. 30 tablet 0 Past Week   apixaban (ELIQUIS) 5 MG TABS tablet Take 1 tablet (5 mg total) by mouth 2 (two) times daily. 60 tablet 1 Past Week   cetirizine (ZYRTEC) 10 MG tablet Take 10 mg by mouth daily.   Past Week   donepezil  (ARICEPT) 5 MG tablet Take 5 mg by mouth daily.   Past Week   empagliflozin (JARDIANCE) 10 MG TABS tablet Take 1 tablet by mouth daily.   Past Week   fluticasone (FLONASE) 50 MCG/ACT nasal spray Place 1 spray into both nostrils 2 (two) times daily as needed for allergies or rhinitis.   unk at unk   furosemide (LASIX) 40 MG tablet Take 40 mg by mouth daily.   Past Month   icosapent Ethyl (VASCEPA) 1 g capsule TAKE 2 CAPSULES BY MOUTH TWICE DAILY WITH MEALS 360 capsule 2 Past Week   isosorbide mononitrate (IMDUR) 30 MG 24 hr tablet Take 30 mg by mouth daily.   Past Week   levothyroxine (SYNTHROID) 112 MCG tablet Take 112 mcg by mouth daily.   Past Week   mometasone-formoterol (DULERA) 200-5 MCG/ACT AERO Inhale 2 puffs into the lungs 2 (two) times daily. 1 each 1 Past Week   omeprazole (PRILOSEC) 40 MG capsule Take 40 mg by mouth daily.   Past Week   potassium chloride (KLOR-CON M) 10 MEQ tablet Take 10 mEq by mouth 2 (two) times daily.   Past Week   pravastatin (PRAVACHOL) 40 MG tablet Take 1 tablet (40 mg total) by mouth daily at 6 PM. 90 tablet 0 Past Week   telmisartan (MICARDIS) 80 MG tablet Take 80 mg by mouth daily.   Past Week   carvedilol (COREG) 12.5 MG tablet Take 12.5 mg by mouth 2 (two) times daily. (Patient not taking: Reported on 01/31/2023)   Not Taking   Social History   Socioeconomic History   Marital status: Widowed    Spouse name: Not on file   Number of children: Not on file   Years of education: Not on file   Highest education level: Not on file  Occupational History   Not on file  Tobacco Use   Smoking status: Never   Smokeless tobacco: Never  Vaping Use   Vaping Use: Never used  Substance and Sexual Activity   Alcohol use: No   Drug use: No   Sexual activity: Not Currently  Other Topics Concern   Not on file  Social History Narrative   Lives with granddaughter, [name], and family.   Social Determinants of Health   Financial Resource Strain: Not on  file  Food Insecurity: No Food Insecurity (02/01/2023)   Hunger Vital Sign    Worried About Running Out of Food in the Last Year: Never true    Ran Out of Food in the Last Year: Never true  Transportation Needs: No Transportation Needs (02/01/2023)   PRAPARE - Administrator, Civil Service (Medical): No    Lack of Transportation (Non-Medical): No  Physical Activity: Not on file  Stress: Not on file  Social Connections: Not on file  Intimate Partner Violence: Not At Risk (02/01/2023)   Humiliation, Afraid, Rape, and Kick questionnaire    Fear of Current or Ex-Partner: No    Emotionally Abused: No  Physically Abused: No    Sexually Abused: No    Family History  Problem Relation Age of Onset   Heart disease Mother    Heart disease Father    Breast cancer Cousin       Intake/Output Summary (Last 24 hours) at 02/02/2023 0946 Last data filed at 02/01/2023 1929 Gross per 24 hour  Intake 1473.15 ml  Output --  Net 1473.15 ml     Vitals:   02/02/23 0001 02/02/23 0430 02/02/23 0441 02/02/23 0820  BP: (!) 173/88 (!) 175/88 (!) 176/92 (!) 175/103  Pulse: (!) 59 60 (!) 59 60  Resp:  16  16  Temp:  98 F (36.7 C)  97.9 F (36.6 C)  TempSrc:  Oral  Oral  SpO2:  97%  93%  Weight:      Height:        PHYSICAL EXAM General: elderly caucasian female , well nourished, in no acute distress. Laying in bed, nurse and student at bedside HEENT:  Normocephalic and atraumatic. Neck:  No JVD.  Lungs: Normal respiratory effort on room air. Clear bilaterally to auscultation. No wheezes, crackles, rhonchi.  Chest: grimaces to palpation of distal sternum Heart: V paced . Normal S1 and S2 without gallops or murmurs.  Abdomen: Non-distended appearing.  Msk: Normal strength and tone for age. Extremities: Warm and well perfused. No clubbing, cyanosis. No peripheral edema.  Neuro: Alert and oriented X 3. Psych:  Answers questions appropriately, limited historian (forgets details).    Labs: Basic Metabolic Panel: Recent Labs    01/31/23 2049 02/01/23 0023 02/01/23 1152 02/02/23 0414  NA 142   < > 143 143  K 2.7*   < > 3.4* 4.4  CL 103   < > 107 106  CO2 30   < > 29 31  GLUCOSE 97   < > 113* 113*  BUN 20   < > 18 19  CREATININE 1.83*   < > 1.89* 1.77*  CALCIUM 9.2   < > 9.0 9.5  MG 2.3  --  2.1  --    < > = values in this interval not displayed.    Liver Function Tests: Recent Labs    01/31/23 2049  AST 26  ALT 17  ALKPHOS 69  BILITOT 1.3*  PROT 6.3*  ALBUMIN 3.0*    No results for input(s): "LIPASE", "AMYLASE" in the last 72 hours. CBC: Recent Labs    01/31/23 2049 02/01/23 0415  WBC 6.0 5.9  NEUTROABS 4.1  --   HGB 12.3 12.4  HCT 38.6 38.2  MCV 91.0 89.7  PLT 229 212    Cardiac Enzymes: Recent Labs    01/31/23 2049 02/01/23 0023  TROPONINIHS 28* 28*    BNP: No results for input(s): "BNP" in the last 72 hours. D-Dimer: No results for input(s): "DDIMER" in the last 72 hours. Hemoglobin A1C: No results for input(s): "HGBA1C" in the last 72 hours. Fasting Lipid Panel: No results for input(s): "CHOL", "HDL", "LDLCALC", "TRIG", "CHOLHDL", "LDLDIRECT" in the last 72 hours. Thyroid Function Tests: Recent Labs    02/01/23 0415  TSH 8.170*  T4TOTAL 13.9*   Anemia Panel: No results for input(s): "VITAMINB12", "FOLATE", "FERRITIN", "TIBC", "IRON", "RETICCTPCT" in the last 72 hours.   Radiology: DG Chest 2 View  Result Date: 01/31/2023 CLINICAL DATA:  Syncope, fall EXAM: CHEST - 2 VIEW COMPARISON:  05/20/2022 FINDINGS: Cardiomegaly. Platelike atelectasis in the left lower lobe. No confluent opacity on the right. No effusions or edema.  No acute bony abnormality. IMPRESSION: Cardiomegaly. Left retrocardiac atelectasis. No active disease. Electronically Signed   By: Charlett NoseKevin  Dover M.D.   On: 01/31/2023 19:08   CT Cervical Spine Wo Contrast  Result Date: 01/31/2023 CLINICAL DATA:  Neck trauma (Age >= 65y) EXAM: CT CERVICAL SPINE  WITHOUT CONTRAST TECHNIQUE: Multidetector CT imaging of the cervical spine was performed without intravenous contrast. Multiplanar CT image reconstructions were also generated. RADIATION DOSE REDUCTION: This exam was performed according to the departmental dose-optimization program which includes automated exposure control, adjustment of the mA and/or kV according to patient size and/or use of iterative reconstruction technique. COMPARISON:  05/20/2022 FINDINGS: Alignment: Normal Skull base and vertebrae: No acute fracture. No primary bone lesion or focal pathologic process. Soft tissues and spinal canal: No prevertebral fluid or swelling. No visible canal hematoma. Disc levels: Diffuse degenerative disc disease. Large flowing osteophytes. Diffuse degenerative facet disease bilaterally. Upper chest: No acute findings Other: None IMPRESSION: Degenerative disc and facet disease.  No acute bony abnormality. Electronically Signed   By: Charlett NoseKevin  Dover M.D.   On: 01/31/2023 19:08   CT Head Wo Contrast  Result Date: 01/31/2023 CLINICAL DATA:  Syncope/presyncope, cerebrovascular cause suspected repeated syncope, fall, possibly hit head EXAM: CT HEAD WITHOUT CONTRAST TECHNIQUE: Contiguous axial images were obtained from the base of the skull through the vertex without intravenous contrast. RADIATION DOSE REDUCTION: This exam was performed according to the departmental dose-optimization program which includes automated exposure control, adjustment of the mA and/or kV according to patient size and/or use of iterative reconstruction technique. COMPARISON:  05/20/2022 FINDINGS: Brain: Old left occipital infarct, unchanged. No acute intracranial abnormality. Specifically, no hemorrhage, hydrocephalus, mass lesion, acute infarction, or significant intracranial injury. Vascular: No hyperdense vessel or unexpected calcification. Skull: No acute calvarial abnormality. Sinuses/Orbits: No acute findings Other: None IMPRESSION: No  acute intracranial abnormality. Electronically Signed   By: Charlett NoseKevin  Dover M.D.   On: 01/31/2023 19:02   DG Hip Unilat W or Wo Pelvis 2-3 Views Right  Result Date: 01/31/2023 CLINICAL DATA:  Fall EXAM: DG HIP (WITH OR WITHOUT PELVIS) 2-3V RIGHT COMPARISON:  None. FINDINGS: The bones are osteopenic. No definite acute fracture or dislocation. Moderate degenerative changes affect the hips and lower lumbar spine. There are mild degenerative changes of the sacroiliac joints. IMPRESSION: 1. No definite acute fracture or dislocation. 2. Moderate degenerative changes of the hips and lower lumbar spine. Electronically Signed   By: Darliss CheneyAmy  Guttmann M.D.   On: 01/31/2023 17:03    ECHO 11/29/2022 SEVERE LV SYSTOLIC DYSFUNCTION (See above)  NORMAL RIGHT VENTRICULAR SYSTOLIC FUNCTION  MODERATE VALVULAR REGURGITATION (See above)  NO VALVULAR STENOSIS  MODERATE MR, TR  MILD PR  TRIVIAL AR  EF 25-30%   TELEMETRY reviewed by me (LT) 02/02/2023 : v pacing 60bpm rare PVC  EKG reviewed by me: V paced 60 PVC  Data reviewed by me (LT) 02/02/2023: hospitalist progress note, nursing notes last 24h vitals tele labs imaging I/O    Principal Problem:   Syncope and collapse Active Problems:   Troponin level elevated   AKI (acute kidney injury)   Hypokalemia   Loss of consciousness   Hyperbilirubinemia   Atelectasis    ASSESSMENT AND PLAN:  Dolores C. Audria NineMcPherson is an [age]yoF with a PMH of chronic HFrEF (EF 25-30% with moderate MR, TR, trivial AR 11/29/2022), permanent atrial fibrillation (amiodarone, Eliquis), SSS s/p Micra leadless pacemaker implantation (07/26/2022), CAD s/p LHC 10/24/21 with an occluded distal OM 3 (too small for PCI), 20%  stenosed LAD, Hx CVA (01/2022), hypothyroidism who presented to [facility] ED/06/2023 after multiple falls at home for which cardiology is consulted for further assistance.  # syncope  # falls The patient is a limited historian regarding the circumstances of her falls and the duration of time  they have been increasing in frequency. Per the Ed note and admission H&P the patient stated the falls have started since she started Jardiance for her HFrEF on 3/26. They occur while she is standing or walking, not mechanical in nature, and unclear regarding preceding prodrome. No current lightheadedness or dizziness. Unclear if they occur with position changes. Possibly related to dehydration/poor PO intake + the addition of Jardiance. She feels well and is asking to go home at my time of evaluation.  - obtain orthostatic vitals - stop jardiance - pacemaker interrogation performed at the bedside by me 4/10 was without observations, device is functioning appropriately with estimated 60yrs battery life.  - encouraged good PO intake of food and water - no additional cardiac diagnostics necessary   # Permanent AF  # SSS s/p Micra leadless pacemaker V pacing on tele rate 60s without additional arrhythmias noted.  - continue amiodarone 200mg  daily for now, recheck of TSH by PCP outpatient - consider stopping entirely with hx permanent AF  - continue eliquis 2.5mg  daily (age >81, Cr >1.5) for stroke prevention   # Chronic HFrEF (25-30%) Euvolemic on exam. GDMT limited by multiple drug intolerances. Continue imdur 30mg  daily, telmistartan equivalent (not on formulary) 80mg  daily. Stop jardiance as above. Hold lasix while inpatient, agree with restarting at lower dose of 20mg  daily at discharge. Consider addition of spiro outpatient or rechallenge of BB now that she has a PPM.  - recommend cautious use of IVF   # CAD with atypical chest pain Reported 2-3/10 chest pain this AM of uncertain duration as patient does not remember. She is hemodynamically stable and pain is reproduced to palpation of distal sternum. Declined SL nitro but agreeable to tylenol. Low suspicion for true anginal pain with reproducibility.  - increase imdur to 60mg  daily tomorrow. Continue eliquis monotherapy. Intolerant of aspirin.   Continue pravastatin 40mg  daily.   # AKI Cr above recent baseline from jan 2024. Cr 1.8 and GFR of 21 today. Avoid nephrotoxins.   Ok for discharge today from a cardiac perspective. Will arrange for follow up in clinic with Minda Ditto, PA-C in 1-2 weeks.    This patient's plan of care was discussed and created with Dr. Darrold Junker and he is in agreement.  Signed: Rebeca Allegra , PA-C 02/02/2023, 9:46 AM [facility] Cardiology

## 2023-02-03 DIAGNOSIS — E876 Hypokalemia: Secondary | ICD-10-CM | POA: Diagnosis not present

## 2023-02-03 DIAGNOSIS — R55 Syncope and collapse: Secondary | ICD-10-CM | POA: Diagnosis not present

## 2023-02-03 DIAGNOSIS — N179 Acute kidney failure, unspecified: Secondary | ICD-10-CM | POA: Diagnosis not present

## 2023-02-03 LAB — BASIC METABOLIC PANEL
Anion gap: 9 (ref 5–15)
BUN: 20 mg/dL (ref 8–23)
CO2: 27 mmol/L (ref 22–32)
Calcium: 9.9 mg/dL (ref 8.9–10.3)
Chloride: 104 mmol/L (ref 98–111)
Creatinine, Ser: 1.55 mg/dL — ABNORMAL HIGH (ref 0.44–1.00)
GFR, Estimated: 32 mL/min — ABNORMAL LOW (ref >60)
Glucose, Bld: 116 mg/dL — ABNORMAL HIGH (ref 70–99)
Potassium: 3.7 mmol/L (ref 3.5–5.1)
Sodium: 140 mmol/L (ref 135–145)

## 2023-02-03 MED ORDER — ISOSORBIDE MONONITRATE ER 60 MG PO TB24: 60.0000 mg | ORAL_TABLET | Freq: Every day | ORAL | 0 refills | Status: DC

## 2023-02-03 MED ORDER — FUROSEMIDE 40 MG PO TABS: 20.0000 mg | ORAL_TABLET | Freq: Every day | ORAL | Status: DC

## 2023-02-03 NOTE — Progress Notes (Signed)
Discharge instructions reviewed with patient including followup visits and new medications. At patient's request, niece Pablo Ledger contacted by phone and reviewed discharge instructions with her as well. Understanding was verbalized and all questions were answered.  IV removed without complication; patient tolerated well.  Awaiting ride.

## 2023-02-03 NOTE — Discharge Summary (Signed)
Physician Discharge Summary   Patient: Andrea Phillips MRN: [id number] DOB: 26-Jan-1935  Admit date:     01/31/2023  Discharge date: {dischdate:26783}  Discharge Physician: Lurene Shadow   PCP: Margaretann Loveless, MD   Recommendations at discharge:  {Tip this will not be part of the note when signed- Example include specific recommendations for outpatient follow-up, pending tests to follow-up on. (Optional):26781}  ***  Discharge Diagnoses: Principal Problem:   Syncope and collapse Active Problems:   AKI (acute kidney injury)   Troponin level elevated   Hypokalemia   Loss of consciousness   Hyperbilirubinemia   Atelectasis  Resolved Problems:   * No resolved hospital problems. [facility] Course: No notes on file  Assessment and Plan: * Syncope and collapse Maintain the patient on telemetry, echo.  Trend troponin.  At this time clinical impression is that patient is having hypovolemic hypotension causing syncope and collapse.  Therefore I will check orthostatic hypotension.  Monitor response to IV hydration.  Patient is an already anticoagulated.  Therefore do not think VTE is a concern here.  I have requested evaluation by Dr. Mayford Knife of cardiology in the morning to kindly evaluate the pacemaker for interrogation.  Staff message sent to be delivered in the morning kindly follow-up  AKI (acute kidney injury) SPECT this is due to furosemide use.  Which may be too much for the patient I will reduce the dose to 20 mg daily.  Patient got normal saline bolus in the ER 500 cc.  I will maintain overnight hydration  Troponin level elevated Chronic, in the setting of creatinine elevation.  Nonspecific trend.  Patient is not having any chest pain  Atelectasis Incentive spirometry ordered  Hyperbilirubinemia Without any associated LFT elevation, minimal, in the setting of recent falls and trauma.  Monitor clinically and trend  Loss of consciousness Due to syncope.  Concern for  seizure episode at this time is low, monitor clinically.      {Tip this will not be part of the note when signed Body mass index is 28.7 kg/m. ,  Nutrition Documentation    Flowsheet Row ED to Hosp-Admission (Current) from 01/31/2023 in [facility] 1C MEDICAL TELEMETRY  Nutrition Problem Inadequate oral intake  Etiology decreased appetite  Nutrition Goal Patient will meet greater than or equal to 90% of their needs  Interventions Ensure Enlive (each supplement provides 350kcal and 20 grams of protein), MVI     ,  (Optional):26781}  {(NOTE) Pain control PDMP Statment (Optional):26782} Consultants: *** Procedures performed: ***  Disposition: {Plan; Disposition:26390} Diet recommendation:  Discharge Diet Orders (From admission, onward)     Start     Ordered   02/03/23 0000  Diet - low sodium heart healthy        02/03/23 1106           {Diet_Plan:26776} DISCHARGE MEDICATION: Allergies as of 02/03/2023       Reactions   Aspirin Hives   Other Hives   Yellow dye 6 FOOD COLOR YELLOW POWDER   Atorvastatin Other (See Comments)   unknown   Codeine Other (See Comments)   Doesn't know   Conj Estrog-medroxyprogest Ace Other (See Comments)   PREMPRO 0.3-1.5 MG ORAL TABLET unknown   Prednisone Other (See Comments)   Chest pain   Raloxifene Other (See Comments)   EVISTA 60 MG ORAL TABLET unknown   Ace Inhibitors Cough   Amlodipine Itching   Valsartan Itching  Medication List     STOP taking these medications    carvedilol 12.5 MG tablet Commonly known as: COREG   empagliflozin 10 MG Tabs tablet Commonly known as: JARDIANCE       TAKE these medications    albuterol 108 (90 Base) MCG/ACT inhaler Commonly known as: VENTOLIN HFA Inhale 2 puffs into the lungs every 6 (six) hours as needed for wheezing or shortness of breath.   amiodarone 200 MG tablet Commonly known as: PACERONE Take 1 tablet (200 mg total) by mouth daily.    apixaban 5 MG Tabs tablet Commonly known as: ELIQUIS Take 1 tablet (5 mg total) by mouth 2 (two) times daily.   cetirizine 10 MG tablet Commonly known as: ZYRTEC Take 10 mg by mouth daily.   donepezil 5 MG tablet Commonly known as: ARICEPT Take 5 mg by mouth daily.   Dulera 200-5 MCG/ACT Aero Generic drug: mometasone-formoterol Inhale 2 puffs into the lungs 2 (two) times daily.   fluticasone 50 MCG/ACT nasal spray Commonly known as: FLONASE Place 1 spray into both nostrils 2 (two) times daily as needed for allergies or rhinitis.   furosemide 40 MG tablet Commonly known as: LASIX Take 0.5 tablets (20 mg total) by mouth daily. What changed: how much to take   icosapent Ethyl 1 g capsule Commonly known as: VASCEPA TAKE 2 CAPSULES BY MOUTH TWICE DAILY WITH MEALS   isosorbide mononitrate 60 MG 24 hr tablet Commonly known as: IMDUR Take 1 tablet (60 mg total) by mouth daily. What changed:  medication strength how much to take   levothyroxine 112 MCG tablet Commonly known as: SYNTHROID Take 112 mcg by mouth daily.   omeprazole 40 MG capsule Commonly known as: PRILOSEC Take 40 mg by mouth daily.   potassium chloride 10 MEQ tablet Commonly known as: KLOR-CON M Take 10 mEq by mouth 2 (two) times daily.   pravastatin 40 MG tablet Commonly known as: PRAVACHOL Take 1 tablet (40 mg total) by mouth daily at 6 PM.   telmisartan 80 MG tablet Commonly known as: MICARDIS Take 80 mg by mouth daily.        Follow-up Information     Gerlene Fee, PA-C. Go in 1 week(s).   Specialty: Cardiology Contact information: [address] [place] [address] [phone]                Discharge Exam: Ceasar Mons Weights   02/01/23 0033 02/02/23 1100  Weight: 68.9 kg 68.9 kg   ***  Condition at discharge: {DC Condition:26389}  The results of significant diagnostics from this hospitalization (including imaging, microbiology, ancillary and laboratory) are  listed below for reference.   Imaging Studies: DG Chest 2 View  Result Date: 01/31/2023 CLINICAL DATA:  Syncope, fall EXAM: CHEST - 2 VIEW COMPARISON:  05/20/2022 FINDINGS: Cardiomegaly. Platelike atelectasis in the left lower lobe. No confluent opacity on the right. No effusions or edema. No acute bony abnormality. IMPRESSION: Cardiomegaly. Left retrocardiac atelectasis. No active disease. Electronically Signed   By: Charlett Nose M.D.   On: 01/31/2023 19:08   CT Cervical Spine Wo Contrast  Result Date: 01/31/2023 CLINICAL DATA:  Neck trauma (Age >= 65y) EXAM: CT CERVICAL SPINE WITHOUT CONTRAST TECHNIQUE: Multidetector CT imaging of the cervical spine was performed without intravenous contrast. Multiplanar CT image reconstructions were also generated. RADIATION DOSE REDUCTION: This exam was performed according to the departmental dose-optimization program which includes automated exposure control, adjustment of the mA and/or kV according to patient size  and/or use of iterative reconstruction technique. COMPARISON:  05/20/2022 FINDINGS: Alignment: Normal Skull base and vertebrae: No acute fracture. No primary bone lesion or focal pathologic process. Soft tissues and spinal canal: No prevertebral fluid or swelling. No visible canal hematoma. Disc levels: Diffuse degenerative disc disease. Large flowing osteophytes. Diffuse degenerative facet disease bilaterally. Upper chest: No acute findings Other: None IMPRESSION: Degenerative disc and facet disease.  No acute bony abnormality. Electronically Signed   By: Charlett Nose M.D.   On: 01/31/2023 19:08   CT Head Wo Contrast  Result Date: 01/31/2023 CLINICAL DATA:  Syncope/presyncope, cerebrovascular cause suspected repeated syncope, fall, possibly hit head EXAM: CT HEAD WITHOUT CONTRAST TECHNIQUE: Contiguous axial images were obtained from the base of the skull through the vertex without intravenous contrast. RADIATION DOSE REDUCTION: This exam was performed  according to the departmental dose-optimization program which includes automated exposure control, adjustment of the mA and/or kV according to patient size and/or use of iterative reconstruction technique. COMPARISON:  05/20/2022 FINDINGS: Brain: Old left occipital infarct, unchanged. No acute intracranial abnormality. Specifically, no hemorrhage, hydrocephalus, mass lesion, acute infarction, or significant intracranial injury. Vascular: No hyperdense vessel or unexpected calcification. Skull: No acute calvarial abnormality. Sinuses/Orbits: No acute findings Other: None IMPRESSION: No acute intracranial abnormality. Electronically Signed   By: Charlett Nose M.D.   On: 01/31/2023 19:02   DG Hip Unilat W or Wo Pelvis 2-3 Views Right  Result Date: 01/31/2023 CLINICAL DATA:  Fall EXAM: DG HIP (WITH OR WITHOUT PELVIS) 2-3V RIGHT COMPARISON:  None. FINDINGS: The bones are osteopenic. No definite acute fracture or dislocation. Moderate degenerative changes affect the hips and lower lumbar spine. There are mild degenerative changes of the sacroiliac joints. IMPRESSION: 1. No definite acute fracture or dislocation. 2. Moderate degenerative changes of the hips and lower lumbar spine. Electronically Signed   By: Darliss Cheney M.D.   On: 01/31/2023 17:03    Microbiology: Results for orders placed or performed during the hospital encounter of 02/08/22  Resp Panel by RT-PCR (Flu A&B, Covid) Nasopharyngeal Swab     Status: None   Collection Time: 02/08/22  9:44 PM   Specimen: Nasopharyngeal Swab; Nasopharyngeal(NP) swabs in vial transport medium  Result Value Ref Range Status   SARS Coronavirus 2 by RT PCR NEGATIVE NEGATIVE Final    Comment: (NOTE) SARS-CoV-2 target nucleic acids are NOT DETECTED.  The SARS-CoV-2 RNA is generally detectable in upper respiratory specimens during the acute phase of infection. The lowest concentration of SARS-CoV-2 viral copies this assay can detect is 138 copies/mL. A negative  result does not preclude SARS-Cov-2 infection and should not be used as the sole basis for treatment or other patient management decisions. A negative result may occur with  improper specimen collection/handling, submission of specimen other than nasopharyngeal swab, presence of viral mutation(s) within the areas targeted by this assay, and inadequate number of viral copies(<138 copies/mL). A negative result must be combined with clinical observations, patient history, and epidemiological information. The expected result is Negative.  Fact Sheet for Patients:  BloggerCourse.com  Fact Sheet for Healthcare Providers:  SeriousBroker.it  This test is no t yet approved or cleared by the [place] FDA and  has been authorized for detection and/or diagnosis of SARS-CoV-2 by FDA under an Emergency Use Authorization (EUA). This EUA will remain  in effect (meaning this test can be used) for the duration of the COVID-19 declaration under Section 564(b)(1) of the Act, 21 U.S.C.section 360bbb-3(b)(1), unless the authorization is terminated  or revoked sooner.       Influenza A by PCR NEGATIVE NEGATIVE Final   Influenza B by PCR NEGATIVE NEGATIVE Final    Comment: (NOTE) The Xpert Xpress SARS-CoV-2/FLU/RSV plus assay is intended as an aid in the diagnosis of influenza from Nasopharyngeal swab specimens and should not be used as a sole basis for treatment. Nasal washings and aspirates are unacceptable for Xpert Xpress SARS-CoV-2/FLU/RSV testing.  Fact Sheet for Patients: BloggerCourse.com  Fact Sheet for Healthcare Providers: SeriousBroker.it  This test is not yet approved or cleared by the [place] FDA and has been authorized for detection and/or diagnosis of SARS-CoV-2 by FDA under an Emergency Use Authorization (EUA). This EUA will remain in effect (meaning this test can be used)  for the duration of the COVID-19 declaration under Section 564(b)(1) of the Act, 21 U.S.C. section 360bbb-3(b)(1), unless the authorization is terminated or revoked.  Performed at [facility], [address] [place], [place] [address]     Labs: CBC: Recent Labs  Lab 01/31/23 2049 02/01/23 0415  WBC 6.0 5.9  NEUTROABS 4.1  --   HGB 12.3 12.4  HCT 38.6 38.2  MCV 91.0 89.7  PLT 229 212   Basic Metabolic Panel: Recent Labs  Lab 01/31/23 2049 02/01/23 0023 02/01/23 0415 02/01/23 1152 02/02/23 0414 02/03/23 0313  NA 142  --  146* 143 143 140  K 2.7* 2.9* 2.8* 3.4* 4.4 3.7  CL 103  --  108 107 106 104  CO2 30  --  GLUCOSE 97  --  108* 113* 113* 116*  BUN 20  --  CREATININE 1.83*  --  1.82* 1.89* 1.77* 1.55*  CALCIUM 9.2  --  9.1 9.0 9.5 9.9  MG 2.3  --   --  2.1  --   --    Liver Function Tests: Recent Labs  Lab 01/31/23 2049  AST 26  ALT 17  ALKPHOS 69  BILITOT 1.3*  PROT 6.3*  ALBUMIN 3.0*   CBG: No results for input(s): "GLUCAP" in the last 168 hours.  Discharge time spent: {LESS THAN/GREATER JXBJ:47829} 30 minutes.  Signed: Lurene Shadow, MD [facility] 02/03/2023

## 2023-02-03 NOTE — Plan of Care (Signed)

## 2023-02-13 ENCOUNTER — Encounter: Payer: Self-pay | Admitting: Internal Medicine

## 2023-02-13 ENCOUNTER — Ambulatory Visit (INDEPENDENT_AMBULATORY_CARE_PROVIDER_SITE_OTHER): Payer: Medicare Other | Admitting: Internal Medicine

## 2023-02-13 ENCOUNTER — Ambulatory Visit: Payer: Medicare Other | Admitting: Internal Medicine

## 2023-02-13 VITALS — BP 132/78 | HR 72 | Ht 62.0 in | Wt 138.6 lb

## 2023-02-13 DIAGNOSIS — I482 Chronic atrial fibrillation, unspecified: Secondary | ICD-10-CM

## 2023-02-13 DIAGNOSIS — I2510 Atherosclerotic heart disease of native coronary artery without angina pectoris: Secondary | ICD-10-CM

## 2023-02-13 DIAGNOSIS — R1011 Right upper quadrant pain: Secondary | ICD-10-CM

## 2023-02-13 DIAGNOSIS — E038 Other specified hypothyroidism: Secondary | ICD-10-CM | POA: Diagnosis not present

## 2023-02-13 DIAGNOSIS — I10 Essential (primary) hypertension: Secondary | ICD-10-CM

## 2023-02-13 DIAGNOSIS — I5022 Chronic systolic (congestive) heart failure: Secondary | ICD-10-CM

## 2023-02-13 NOTE — Progress Notes (Signed)
Established Patient Office Visit  Subjective:  Patient ID: Andrea Phillips, female    DOB: 05-27-1935  Age: 88 y.o. MRN: [id number]  Chief Complaint  Patient presents with   Follow-up    Hospital follow up, soreness under right breast    Patient comes in for her hospital follow-up.  She was recently admitted to [facility] with a history of syncopal episode, and multiple falls prior to admission.  In the emergency room  her potassium was found to be low as well as low GFR.  Patient's had reported that her falls had increased since she was started on Jardiance which was then stopped.  Her cardiac workup during this hospitalization was unchanged.  Patient was discharged home with instructions to follow-up and to get her BMP done. Today patient reports of right upper quadrant pain which started after  she left the hospital.  Patient is s/p cholecystectomy.  She denies any nausea or vomiting, no diarrhea or constipation.  But she has reduced appetite. During her hospitalization she had complained of left hip pain for which x-rays were done and found to be negative.    No other concerns at this time.   Past Medical History:  Diagnosis Date   Arthritis    ra   Asthma    CHF (congestive heart failure)    Dysrhythmia    Edema extremities    GERD (gastroesophageal reflux disease)    Gout    Headache    History of hiatal hernia    Hypertension    Hypothyroidism    Shortness of breath dyspnea    Sleep apnea     Past Surgical History:  Procedure Laterality Date   APPENDECTOMY     BACK SURGERY     CARDIAC CATHETERIZATION     CATARACT EXTRACTION W/PHACO Left 03/09/2015   Procedure: CATARACT EXTRACTION PHACO AND INTRAOCULAR LENS PLACEMENT (IOC);  Surgeon: Sallee Lange, MD;  Location: [facility] ORS;  Service: Ophthalmology;  Laterality: Left;  [place] 01:31 AP% 26.1 CDE 43.72   CATARACT EXTRACTION W/PHACO Right 08/19/2021   Procedure: CATARACT EXTRACTION PHACO AND INTRAOCULAR LENS PLACEMENT  (IOC) RIGHT;  Surgeon: Galen Manila, MD;  Location: [facility] ORS;  Service: Ophthalmology;  Laterality: Right;  12.31 1:07.0   CATARACT EXTRACTION W/PHACO Right 09/07/2021   Procedure: REMOVAL OF LENS FRAGMENTS RIGHT;  Surgeon: Galen Manila, MD;  Location: [facility];  Service: Ophthalmology;  Laterality: Right;   CHOLECYSTECTOMY     CORONARY/GRAFT ACUTE MI REVASCULARIZATION N/A 10/24/2021   Procedure: Coronary/Graft Acute MI Revascularization;  Surgeon: Yvonne Kendall, MD;  Location: [facility] INVASIVE CV LAB;  Service: Cardiovascular;  Laterality: N/A;   EYE SURGERY     retina   JOINT REPLACEMENT     left knee   LEFT HEART CATH AND CORONARY ANGIOGRAPHY N/A 04/02/2021   Procedure: LEFT HEART CATH AND CORONARY ANGIOGRAPHY;  Surgeon: Lamar Blinks, MD;  Location: [facility] INVASIVE CV LAB;  Service: Cardiovascular;  Laterality: N/A;   LEFT HEART CATH AND CORONARY ANGIOGRAPHY N/A 10/24/2021   Procedure: LEFT HEART CATH AND CORONARY ANGIOGRAPHY;  Surgeon: Yvonne Kendall, MD;  Location: [facility] INVASIVE CV LAB;  Service: Cardiovascular;  Laterality: N/A;   PACEMAKER LEADLESS INSERTION N/A 07/26/2022   Procedure: PACEMAKER LEADLESS INSERTION;  Surgeon: Marcina Millard, MD;  Location: [facility] INVASIVE CV LAB;  Service: Cardiovascular;  Laterality: N/A;    Social History   Socioeconomic History   Marital status: Widowed    Spouse name: Not on file   Number of  children: Not on file   Years of education: Not on file   Highest education level: Not on file  Occupational History   Not on file  Tobacco Use   Smoking status: Never   Smokeless tobacco: Never  Vaping Use   Vaping Use: Never used  Substance and Sexual Activity   Alcohol use: No   Drug use: No   Sexual activity: Not Currently  Other Topics Concern   Not on file  Social History Narrative   Lives with granddaughter, [name], and family.   Social Determinants of Health   Financial Resource Strain: Not on file   Food Insecurity: No Food Insecurity (02/01/2023)   Hunger Vital Sign    Worried About Running Out of Food in the Last Year: Never true    Ran Out of Food in the Last Year: Never true  Transportation Needs: No Transportation Needs (02/01/2023)   PRAPARE - Administrator, Civil Service (Medical): No    Lack of Transportation (Non-Medical): No  Physical Activity: Not on file  Stress: Not on file  Social Connections: Not on file  Intimate Partner Violence: Not At Risk (02/01/2023)   Humiliation, Afraid, Rape, and Kick questionnaire    Fear of Current or Ex-Partner: No    Emotionally Abused: No    Physically Abused: No    Sexually Abused: No    Family History  Problem Relation Age of Onset   Heart disease Mother    Heart disease Father    Breast cancer Cousin     Allergies  Allergen Reactions   Aspirin Hives   Other Hives    Yellow dye 6 FOOD COLOR YELLOW POWDER    Atorvastatin Other (See Comments)    unknown   Codeine Other (See Comments)    Doesn't know   Conj Estrog-Medroxyprogest Ace Other (See Comments)    PREMPRO 0.3-1.5 MG ORAL TABLET unknown   Prednisone Other (See Comments)    Chest pain   Raloxifene Other (See Comments)    EVISTA 60 MG ORAL TABLET unknown   Ace Inhibitors Cough   Amlodipine Itching   Valsartan Itching    Review of Systems  Constitutional:  Positive for malaise/fatigue and weight loss. Negative for chills, diaphoresis and fever.  HENT: Negative.    Eyes: Negative.   Respiratory:  Positive for shortness of breath. Negative for cough and wheezing.   Cardiovascular:  Positive for orthopnea. Negative for chest pain, palpitations, leg swelling and PND.  Gastrointestinal:  Positive for abdominal pain (RUQ). Negative for constipation, diarrhea, heartburn, nausea and vomiting.  Genitourinary: Negative.   Musculoskeletal:  Positive for falls and joint pain. Negative for back pain, myalgias and neck pain.  Neurological:  Positive for  dizziness and tremors. Negative for tingling, focal weakness, seizures, weakness and headaches.  Psychiatric/Behavioral: Negative.         Objective:   BP 132/78   Pulse 72   Ht  (1.575 m)   Wt 138 lb 9.6 oz (62.9 kg)   SpO2 94%   BMI 25.35 kg/m   Vitals:   02/13/23 1352  BP: 132/78  Pulse: 72  Height:  (1.575 m)  Weight: 138 lb 9.6 oz (62.9 kg)  SpO2: 94%  BMI (Calculated): 25.34    Physical Exam Vitals and nursing note reviewed.  Constitutional:      Appearance: Normal appearance. She is obese.  Cardiovascular:     Rate and Rhythm: Rhythm irregular.     Pulses:  Normal pulses.     Heart sounds: Murmur heard.  Pulmonary:     Effort: Pulmonary effort is normal.     Breath sounds: Normal breath sounds. No wheezing, rhonchi or rales.  Abdominal:     Palpations: Abdomen is soft. There is no mass.     Tenderness: There is abdominal tenderness (RUQ). There is no right CVA tenderness, left CVA tenderness, guarding or rebound.  Musculoskeletal:     Cervical back: Normal range of motion and neck supple.  Neurological:     General: No focal deficit present.     Mental Status: She is alert and oriented to person, place, and time.  Psychiatric:        Mood and Affect: Mood normal.        Behavior: Behavior normal.        Assessment & Plan:  Patient comes in with new onset right upper quadrant pain. Will check blood work. Schedule abdominal ultrasound. Patient to continue all her medications. Problem List Items Addressed This Visit     Essential hypertension   Chronic HFrEF (heart failure with reduced ejection fraction)  with improved EF(HCC)   Hypothyroidism   Coronary artery disease involving native coronary artery of native heart without angina pectoris   RUQ pain - Primary   Relevant Orders   US Abdomen Limited RUQ (LIVER/GB)   CBC With Differential   CMP14+EGFR   Lipase   Chronic atrial fibrillation    Return in about 2 weeks (around  02/27/2023).   Total time spent: 30 minutes  Margaretann Loveless, MD  02/13/2023

## 2023-02-14 LAB — CMP14+EGFR
ALT: 24 IU/L (ref 0–32)
AST: 34 IU/L (ref 0–40)
Albumin/Globulin Ratio: 1.3 (ref 1.2–2.2)
Albumin: 3.6 g/dL — ABNORMAL LOW (ref 3.7–4.7)
Alkaline Phosphatase: 99 IU/L (ref 44–121)
BUN/Creatinine Ratio: 11 — ABNORMAL LOW (ref 12–28)
BUN: 13 mg/dL (ref 8–27)
Bilirubin Total: 1 mg/dL (ref 0.0–1.2)
CO2: 25 mmol/L (ref 20–29)
Calcium: 9.9 mg/dL (ref 8.7–10.3)
Chloride: 104 mmol/L (ref 96–106)
Creatinine, Ser: 1.23 mg/dL — ABNORMAL HIGH (ref 0.57–1.00)
Globulin, Total: 2.8 g/dL (ref 1.5–4.5)
Glucose: 74 mg/dL (ref 70–99)
Potassium: 3.1 mmol/L — ABNORMAL LOW (ref 3.5–5.2)
Sodium: 146 mmol/L — ABNORMAL HIGH (ref 134–144)
Total Protein: 6.4 g/dL (ref 6.0–8.5)
eGFR: 43 mL/min/{1.73_m2} — ABNORMAL LOW (ref >59)

## 2023-02-14 LAB — CBC WITH DIFFERENTIAL
Basophils Absolute: 0 10*3/uL (ref 0.0–0.2)
Basos: 1 %
EOS (ABSOLUTE): 0.2 10*3/uL (ref 0.0–0.4)
Eos: 4 %
Hematocrit: 42.3 % (ref 34.0–46.6)
Hemoglobin: 13.3 g/dL (ref 11.1–15.9)
Immature Grans (Abs): 0 10*3/uL (ref 0.0–0.1)
Immature Granulocytes: 0 %
Lymphocytes Absolute: 1 10*3/uL (ref 0.7–3.1)
Lymphs: 16 %
MCH: 28.9 pg (ref 26.6–33.0)
MCHC: 31.4 g/dL — ABNORMAL LOW (ref 31.5–35.7)
MCV: 92 fL (ref 79–97)
Monocytes Absolute: 0.5 10*3/uL (ref 0.1–0.9)
Monocytes: 8 %
Neutrophils Absolute: 4.1 10*3/uL (ref 1.4–7.0)
Neutrophils: 71 %
RBC: 4.61 x10E6/uL (ref 3.77–5.28)
RDW: 15.1 % (ref 11.7–15.4)
WBC: 5.8 10*3/uL (ref 3.4–10.8)

## 2023-02-14 LAB — LIPASE: Lipase: 17 U/L (ref 14–85)

## 2023-02-16 ENCOUNTER — Ambulatory Visit
Admission: RE | Admit: 2023-02-16 | Discharge: 2023-02-16 | Disposition: A | Discharge status: Home / Self Care | Payer: Medicare Other | Source: Ambulatory Visit | Attending: Internal Medicine | Admitting: Internal Medicine

## 2023-02-16 DIAGNOSIS — R1011 Right upper quadrant pain: Secondary | ICD-10-CM

## 2023-02-27 ENCOUNTER — Ambulatory Visit
Admission: RE | Admit: 2023-02-27 | Discharge: 2023-02-27 | Disposition: A | Discharge status: Home / Self Care | Payer: Medicare Other | Attending: Internal Medicine | Admitting: Internal Medicine

## 2023-02-27 ENCOUNTER — Encounter: Payer: Self-pay | Admitting: Internal Medicine

## 2023-02-27 ENCOUNTER — Ambulatory Visit (INDEPENDENT_AMBULATORY_CARE_PROVIDER_SITE_OTHER): Payer: Medicare Other | Admitting: Internal Medicine

## 2023-02-27 ENCOUNTER — Ambulatory Visit
Admission: RE | Admit: 2023-02-27 | Discharge: 2023-02-27 | Disposition: A | Discharge status: Home / Self Care | Payer: Medicare Other | Source: Ambulatory Visit | Attending: Internal Medicine | Admitting: Internal Medicine

## 2023-02-27 VITALS — BP 132/68 | HR 103 | Ht 62.0 in | Wt 131.0 lb

## 2023-02-27 DIAGNOSIS — I10 Essential (primary) hypertension: Secondary | ICD-10-CM | POA: Diagnosis not present

## 2023-02-27 DIAGNOSIS — E876 Hypokalemia: Secondary | ICD-10-CM

## 2023-02-27 DIAGNOSIS — I482 Chronic atrial fibrillation, unspecified: Secondary | ICD-10-CM

## 2023-02-27 DIAGNOSIS — R051 Acute cough: Secondary | ICD-10-CM | POA: Diagnosis not present

## 2023-02-27 DIAGNOSIS — E038 Other specified hypothyroidism: Secondary | ICD-10-CM | POA: Diagnosis not present

## 2023-02-27 MED ORDER — LEVOTHYROXINE SODIUM 125 MCG PO TABS
125.0000 ug | ORAL_TABLET | Freq: Every day | ORAL | 11 refills | Status: DC
Start: 2023-02-27 — End: 2023-10-03

## 2023-02-27 MED ORDER — POTASSIUM CHLORIDE CRYS ER 10 MEQ PO TBCR: 20.0000 meq | EXTENDED_RELEASE_TABLET | Freq: Two times a day (BID) | ORAL | 3 refills | Status: DC

## 2023-02-27 MED ORDER — AMOXICILLIN-POT CLAVULANATE 500-125 MG PO TABS
1.0000 | ORAL_TABLET | Freq: Two times a day (BID) | ORAL | 0 refills | Status: DC
Start: 2023-02-27 — End: 2023-03-17

## 2023-02-27 NOTE — Progress Notes (Signed)
Established Patient Office Visit  Subjective:  Patient ID: Andrea Phillips, female    DOB: [birth year]  Age: 88 y.o. MRN: [id number]  Chief Complaint  Patient presents with   Follow-up    2 week follow up    Patient comes in for follow-up with her family member.  At her last visit Andrea Phillips was complaining of right upper quadrant abdominal pain, which has  now completely resolved. Patient had  abdominal ultrasound which was unremarkable as well as a blood work for lipase.  However her potassium was still low and patient has been advised to increase her dose of potassium supplement. Today Andrea Phillips has  complaints of a productive cough for the last 1 week.  Andrea Phillips does not have any sore throat or sinus congestion.  But Andrea Phillips is feeling congestion in her chest and is coughing up some colored sputum.  Andrea Phillips denies fevers or chills, no nausea vomiting, no diarrhea.  Andrea Phillips did not have any body aches.  However Andrea Phillips is unable to rest because of her cough. Will check a chest x-ray, and start Augmentin p.o. along with Mucinex.    No other concerns at this time.   Past Medical History:  Diagnosis Date   Arthritis    ra   Asthma    CHF (congestive heart failure) (HCC)    Dysrhythmia    Edema extremities    GERD (gastroesophageal reflux disease)    Gout    Headache    History of hiatal hernia    Hypertension    Hypothyroidism    Shortness of breath dyspnea    Sleep apnea     Past Surgical History:  Procedure Laterality Date   APPENDECTOMY     BACK SURGERY     CARDIAC CATHETERIZATION     CATARACT EXTRACTION W/PHACO Left 03/09/2015   Procedure: CATARACT EXTRACTION PHACO AND INTRAOCULAR LENS PLACEMENT (IOC);  Surgeon: Sallee Lange, MD;  Location: [facility];  Service: Ophthalmology;  Laterality: Left;  [place] 01:31 AP% 26.1 CDE 43.72   CATARACT EXTRACTION W/PHACO Right 08/19/2021   Procedure: CATARACT EXTRACTION PHACO AND INTRAOCULAR LENS PLACEMENT (IOC) RIGHT;  Surgeon: Galen Manila, MD;   Location: [facility];  Service: Ophthalmology;  Laterality: Right;  12.31 1:07.0   CATARACT EXTRACTION W/PHACO Right 09/07/2021   Procedure: REMOVAL OF LENS FRAGMENTS RIGHT;  Surgeon: Galen Manila, MD;  Location: [facility];  Service: Ophthalmology;  Laterality: Right;   CHOLECYSTECTOMY     CORONARY/GRAFT ACUTE MI REVASCULARIZATION N/A 10/24/2021   Procedure: Coronary/Graft Acute MI Revascularization;  Surgeon: Yvonne Kendall, MD;  Location: [facility];  Service: Cardiovascular;  Laterality: N/A;   EYE SURGERY     retina   JOINT REPLACEMENT     left knee   LEFT HEART CATH AND CORONARY ANGIOGRAPHY N/A 04/02/2021   Procedure: LEFT HEART CATH AND CORONARY ANGIOGRAPHY;  Surgeon: Lamar Blinks, MD;  Location: [facility];  Service: Cardiovascular;  Laterality: N/A;   LEFT HEART CATH AND CORONARY ANGIOGRAPHY N/A 10/24/2021   Procedure: LEFT HEART CATH AND CORONARY ANGIOGRAPHY;  Surgeon: Yvonne Kendall, MD;  Location: [facility];  Service: Cardiovascular;  Laterality: N/A;   PACEMAKER LEADLESS INSERTION N/A 07/26/2022   Procedure: PACEMAKER LEADLESS INSERTION;  Surgeon: Marcina Millard, MD;  Location: [facility];  Service: Cardiovascular;  Laterality: N/A;    Social History   Socioeconomic History   Marital status: Widowed    Spouse name: Not on file   Number of  children: Not on file   Years of education: Not on file   Highest education level: Not on file  Occupational History   Not on file  Tobacco Use   Smoking status: Never   Smokeless tobacco: Never  Vaping Use   Vaping Use: Never used  Substance and Sexual Activity   Alcohol use: No   Drug use: No   Sexual activity: Not Currently  Other Topics Concern   Not on file  Social History Narrative   Lives with granddaughter, [name], and family.   Social Determinants of Health   Financial Resource Strain: Not on file  Food Insecurity: No Food Insecurity (02/01/2023)    Hunger Vital Sign    Worried About Running Out of Food in the Last Year: Never true    Ran Out of Food in the Last Year: Never true  Transportation Needs: No Transportation Needs (02/01/2023)   PRAPARE - Administrator, Civil Service (Medical): No    Lack of Transportation (Non-Medical): No  Physical Activity: Not on file  Stress: Not on file  Social Connections: Not on file  Intimate Partner Violence: Not At Risk (02/01/2023)   Humiliation, Afraid, Rape, and Kick questionnaire    Fear of Current or Ex-Partner: No    Emotionally Abused: No    Physically Abused: No    Sexually Abused: No    Family History  Problem Relation Age of Onset   Heart disease Mother    Heart disease Father    Breast cancer Cousin     Allergies  Allergen Reactions   Aspirin Hives   Other Hives    Yellow dye 6 FOOD COLOR YELLOW POWDER    Atorvastatin Other (See Comments)    unknown   Codeine Other (See Comments)    Doesn't know   Conj Estrog-Medroxyprogest Ace Other (See Comments)    PREMPRO 0.3-1.5 MG ORAL TABLET unknown   Prednisone Other (See Comments)    Chest pain   Raloxifene Other (See Comments)    EVISTA 60 MG ORAL TABLET unknown   Ace Inhibitors Cough   Amlodipine Itching   Valsartan Itching    Review of Systems  Constitutional:  Positive for malaise/fatigue. Negative for chills, diaphoresis, fever and weight loss.  HENT:  Positive for hearing loss. Negative for congestion, ear discharge, ear pain, nosebleeds, sinus pain, sore throat and tinnitus.   Eyes: Negative.   Respiratory:  Positive for cough and sputum production. Negative for hemoptysis, shortness of breath, wheezing and stridor.   Cardiovascular:  Negative for chest pain, palpitations, orthopnea, claudication, leg swelling and PND.  Gastrointestinal:  Negative for abdominal pain, blood in stool, constipation, diarrhea, heartburn, melena, nausea and vomiting.  Genitourinary:  Negative for dysuria, flank pain,  frequency, hematuria and urgency.  Musculoskeletal:  Negative for back pain, myalgias and neck pain.  Skin: Negative.   Neurological:  Positive for dizziness. Negative for sensory change, speech change, focal weakness and headaches.  Psychiatric/Behavioral:  Negative for depression. The patient is not nervous/anxious and does not have insomnia.        Objective:   BP 132/68   Pulse (!) 103   Ht 5\' 2"  (1.575 m)   Wt 131 lb (59.4 kg)   SpO2 94%   BMI 23.96 kg/m   Vitals:   02/27/23 1335  BP: 132/68  Pulse: (!) 103  Height: 5\' 2"  (1.575 m)  Weight: 131 lb (59.4 kg)  SpO2: 94%  BMI (Calculated): 23.95  Physical Exam Vitals and nursing note reviewed.  Constitutional:      Appearance: Normal appearance.  HENT:     Nose: Nose normal.     Mouth/Throat:     Mouth: Mucous membranes are moist.     Pharynx: No oropharyngeal exudate or posterior oropharyngeal erythema.  Cardiovascular:     Rate and Rhythm: Normal rate and regular rhythm.     Pulses: Normal pulses.     Heart sounds: Normal heart sounds.  Pulmonary:     Effort: Pulmonary effort is normal. No respiratory distress.     Breath sounds: Normal breath sounds. No wheezing or rhonchi.  Chest:     Chest wall: Tenderness present.  Abdominal:     General: Bowel sounds are normal. There is no distension.     Palpations: Abdomen is soft. There is no mass.     Tenderness: There is no guarding.  Musculoskeletal:        General: Normal range of motion.     Cervical back: Normal range of motion and neck supple.  Skin:    General: Skin is warm and dry.  Neurological:     General: No focal deficit present.     Mental Status: Andrea Phillips is alert.  Psychiatric:        Mood and Affect: Mood normal.        Behavior: Behavior normal.      No results found for any visits on 02/27/23.      Assessment & Plan:  Chest x-ray today.  Start p.o. Augmentin.  Trial of Mucinex.  Rest and fluids. Will check her potassium again at  next visit in 2 weeks. Problem List Items Addressed This Visit     Essential hypertension   Hypothyroidism   Relevant Medications   levothyroxine (SYNTHROID) 125 MCG tablet   Hypokalemia   Relevant Medications   potassium chloride (KLOR-CON M) 10 MEQ tablet   Chronic atrial fibrillation (HCC)   Acute cough - Primary   Relevant Medications   amoxicillin-clavulanate (AUGMENTIN) 500-125 MG tablet   Other Relevant Orders   DG Chest 2 View (Completed)    Return in about 2 weeks (around 03/13/2023).   Total time spent: 30 minutes  Margaretann Loveless, MD  02/27/2023

## 2023-03-11 ENCOUNTER — Other Ambulatory Visit: Payer: Self-pay | Admitting: Internal Medicine

## 2023-03-11 DIAGNOSIS — E782 Mixed hyperlipidemia: Secondary | ICD-10-CM

## 2023-03-17 ENCOUNTER — Ambulatory Visit (INDEPENDENT_AMBULATORY_CARE_PROVIDER_SITE_OTHER): Payer: Medicare Other | Admitting: Internal Medicine

## 2023-03-17 ENCOUNTER — Encounter: Payer: Self-pay | Admitting: Internal Medicine

## 2023-03-17 VITALS — BP 140/80 | HR 73 | Ht 60.0 in | Wt 134.0 lb

## 2023-03-17 DIAGNOSIS — I5022 Chronic systolic (congestive) heart failure: Secondary | ICD-10-CM

## 2023-03-17 DIAGNOSIS — E876 Hypokalemia: Secondary | ICD-10-CM

## 2023-03-17 DIAGNOSIS — I2510 Atherosclerotic heart disease of native coronary artery without angina pectoris: Secondary | ICD-10-CM | POA: Diagnosis not present

## 2023-03-17 DIAGNOSIS — I10 Essential (primary) hypertension: Secondary | ICD-10-CM

## 2023-03-17 DIAGNOSIS — I482 Chronic atrial fibrillation, unspecified: Secondary | ICD-10-CM

## 2023-03-17 NOTE — Progress Notes (Signed)
Established Patient Office Visit  Subjective:  Patient ID: Andrea Phillips, female    DOB: Feb 13, 1935  Age: 88 y.o. MRN: [id number]  Chief Complaint  Patient presents with   Follow-up    2 week follow up    Patient comes in for follow-up accompanied by her family member.  At her last visit she was having acute cough with chest congestion and production of greenish sputum.  Her chest x-ray was negative and she was treated with p.o. Augmentin and Mucinex.  Today she feels much better, there is no more cough or chest congestion and no breathing difficulties.  She is now more active and has been walking around with the help of a cane.  But she has been advised to use a walker instead of the cane for her balance issues. Patient's potassium was low at her last lab work and she was given supplement.  Will repeat today.    No other concerns at this time.   Past Medical History:  Diagnosis Date   Arthritis    ra   Asthma    CHF (congestive heart failure) (HCC)    Dysrhythmia    Edema extremities    GERD (gastroesophageal reflux disease)    Gout    Headache    History of hiatal hernia    Hypertension    Hypothyroidism    Shortness of breath dyspnea    Sleep apnea     Past Surgical History:  Procedure Laterality Date   APPENDECTOMY     BACK SURGERY     CARDIAC CATHETERIZATION     CATARACT EXTRACTION W/PHACO Left 03/09/2015   Procedure: CATARACT EXTRACTION PHACO AND INTRAOCULAR LENS PLACEMENT (IOC);  Surgeon: Sallee Lange, MD;  Location: [facility];  Service: Ophthalmology;  Laterality: Left;  [place] 01:31 AP% 26.1 CDE 43.72   CATARACT EXTRACTION W/PHACO Right 08/19/2021   Procedure: CATARACT EXTRACTION PHACO AND INTRAOCULAR LENS PLACEMENT (IOC) RIGHT;  Surgeon: Galen Manila, MD;  Location: [facility];  Service: Ophthalmology;  Laterality: Right;  12.31 1:07.0   CATARACT EXTRACTION W/PHACO Right 09/07/2021   Procedure: REMOVAL OF LENS FRAGMENTS RIGHT;  Surgeon: Galen Manila, MD;  Location: [facility];  Service: Ophthalmology;  Laterality: Right;   CHOLECYSTECTOMY     CORONARY/GRAFT ACUTE MI REVASCULARIZATION N/A 10/24/2021   Procedure: Coronary/Graft Acute MI Revascularization;  Surgeon: Yvonne Kendall, MD;  Location: [facility];  Service: Cardiovascular;  Laterality: N/A;   EYE SURGERY     retina   JOINT REPLACEMENT     left knee   LEFT HEART CATH AND CORONARY ANGIOGRAPHY N/A 04/02/2021   Procedure: LEFT HEART CATH AND CORONARY ANGIOGRAPHY;  Surgeon: Lamar Blinks, MD;  Location: [facility];  Service: Cardiovascular;  Laterality: N/A;   LEFT HEART CATH AND CORONARY ANGIOGRAPHY N/A 10/24/2021   Procedure: LEFT HEART CATH AND CORONARY ANGIOGRAPHY;  Surgeon: Yvonne Kendall, MD;  Location: [facility];  Service: Cardiovascular;  Laterality: N/A;   PACEMAKER LEADLESS INSERTION N/A 07/26/2022   Procedure: PACEMAKER LEADLESS INSERTION;  Surgeon: Marcina Millard, MD;  Location: [facility];  Service: Cardiovascular;  Laterality: N/A;    Social History   Socioeconomic History   Marital status: Widowed    Spouse name: Not on file   Number of children: Not on file   Years of education: Not on file   Highest education level: Not on file  Occupational History   Not on file  Tobacco Use  Smoking status: Never   Smokeless tobacco: Never  Vaping Use   Vaping Use: Never used  Substance and Sexual Activity   Alcohol use: No   Drug use: No   Sexual activity: Not Currently  Other Topics Concern   Not on file  Social History Narrative   Lives with granddaughter, [name], and family.   Social Determinants of Health   Financial Resource Strain: Not on file  Food Insecurity: No Food Insecurity (02/01/2023)   Hunger Vital Sign    Worried About Running Out of Food in the Last Year: Never true    Ran Out of Food in the Last Year: Never true  Transportation Needs: No Transportation Needs  (02/01/2023)   PRAPARE - Administrator, Civil Service (Medical): No    Lack of Transportation (Non-Medical): No  Physical Activity: Not on file  Stress: Not on file  Social Connections: Not on file  Intimate Partner Violence: Not At Risk (02/01/2023)   Humiliation, Afraid, Rape, and Kick questionnaire    Fear of Current or Ex-Partner: No    Emotionally Abused: No    Physically Abused: No    Sexually Abused: No    Family History  Problem Relation Age of Onset   Heart disease Mother    Heart disease Father    Breast cancer Cousin     Allergies  Allergen Reactions   Aspirin Hives   Other Hives    Yellow dye 6 FOOD COLOR YELLOW POWDER    Atorvastatin Other (See Comments)    unknown   Codeine Other (See Comments)    Doesn't know   Conj Estrog-Medroxyprogest Ace Other (See Comments)    PREMPRO 0.3-1.5 MG ORAL TABLET unknown   Prednisone Other (See Comments)    Chest pain   Raloxifene Other (See Comments)    EVISTA 60 MG ORAL TABLET unknown   Ace Inhibitors Cough   Amlodipine Itching   Valsartan Itching    Review of Systems  Constitutional:  Negative for chills, diaphoresis, fever, malaise/fatigue and weight loss.  HENT:  Negative for hearing loss, sore throat and tinnitus.   Eyes: Negative.   Respiratory:  Negative for cough, shortness of breath, wheezing and stridor.   Cardiovascular:  Negative for chest pain, palpitations, leg swelling and PND.  Gastrointestinal:  Negative for abdominal pain, blood in stool, constipation, diarrhea, heartburn, nausea and vomiting.  Genitourinary:  Negative for dysuria, frequency, hematuria and urgency.  Musculoskeletal:  Positive for joint pain. Negative for back pain, falls and neck pain.  Neurological:  Positive for dizziness. Negative for speech change, focal weakness, seizures and weakness.  Psychiatric/Behavioral:  Negative for depression. The patient is not nervous/anxious.        Objective:   BP (!) 140/80    Pulse 73   Ht 5' (1.524 m)   Wt 134 lb (60.8 kg)   SpO2 95%   BMI 26.17 kg/m   Vitals:   03/17/23 1419  BP: (!) 140/80  Pulse: 73  Height: 5' (1.524 m)  Weight: 134 lb (60.8 kg)  SpO2: 95%  BMI (Calculated): 26.17    Physical Exam Vitals and nursing note reviewed.  Constitutional:      General: She is not in acute distress.    Appearance: Normal appearance.  HENT:     Head: Normocephalic and atraumatic.     Nose: Nose normal. No congestion or rhinorrhea.     Mouth/Throat:     Pharynx: No oropharyngeal exudate or posterior oropharyngeal  erythema.  Cardiovascular:     Rate and Rhythm: Normal rate and regular rhythm.     Pulses: Normal pulses.     Heart sounds: Murmur heard.  Pulmonary:     Effort: Pulmonary effort is normal. No respiratory distress.     Breath sounds: Normal breath sounds. No rhonchi or rales.  Abdominal:     General: Bowel sounds are normal. There is no distension.     Palpations: Abdomen is soft. There is no mass.     Tenderness: There is no abdominal tenderness. There is no right CVA tenderness or left CVA tenderness.  Musculoskeletal:        General: No swelling or tenderness. Normal range of motion.     Cervical back: Normal range of motion and neck supple.     Right lower leg: No edema.     Left lower leg: No edema.  Lymphadenopathy:     Cervical: No cervical adenopathy.  Neurological:     General: No focal deficit present.     Mental Status: She is alert and oriented to person, place, and time.  Psychiatric:        Mood and Affect: Mood normal.        Behavior: Behavior normal.      No results found for any visits on 03/17/23.      Assessment & Plan:  Patient advised to continue her medications as such.  Also to increase her physical activity as tolerated. Will check her potassium and adjust further. Problem List Items Addressed This Visit     Essential hypertension - Primary   Relevant Orders   Basic metabolic panel   Chronic  HFrEF (heart failure with reduced ejection fraction)  with improved EF(HCC)   Coronary artery disease involving native coronary artery of native heart without angina pectoris   Hypokalemia   Chronic atrial fibrillation (HCC)    Return in about 3 months (around 06/17/2023).   Total time spent: 30 minutes  Margaretann Loveless, MD  03/17/2023   This document may have been prepared by [facility] Voice Recognition software and as such may include unintentional dictation errors.

## 2023-03-18 LAB — BASIC METABOLIC PANEL
BUN/Creatinine Ratio: 7 — ABNORMAL LOW (ref 12–28)
BUN: 10 mg/dL (ref 8–27)
CO2: 27 mmol/L (ref 20–29)
Calcium: 9.6 mg/dL (ref 8.7–10.3)
Chloride: 103 mmol/L (ref 96–106)
Creatinine, Ser: 1.39 mg/dL — ABNORMAL HIGH (ref 0.57–1.00)
Glucose: 85 mg/dL (ref 70–99)
Potassium: 3.6 mmol/L (ref 3.5–5.2)
Sodium: 143 mmol/L (ref 134–144)
eGFR: 37 mL/min/{1.73_m2} — ABNORMAL LOW (ref >59)

## 2023-03-20 ENCOUNTER — Ambulatory Visit: Payer: Medicare Other | Admitting: Internal Medicine

## 2023-05-06 IMAGING — MG MM DIGITAL SCREENING BILAT W/ TOMO AND CAD
8 series · 8 of 24 positions shown · non-contrast
Comparison: Previous exam(s).

ACR Breast Density Category a: The breast tissue is almost entirely
fatty.

CLINICAL DATA: Screening.

EXAM:
DIGITAL SCREENING BILATERAL MAMMOGRAM WITH TOMOSYNTHESIS AND CAD
TECHNIQUE: Bilateral screening digital craniocaudal and mediolateral oblique
mammograms were obtained. Bilateral screening digital breast
tomosynthesis was performed. The images were evaluated with
computer-aided detection.

[L MLO synth-2D]
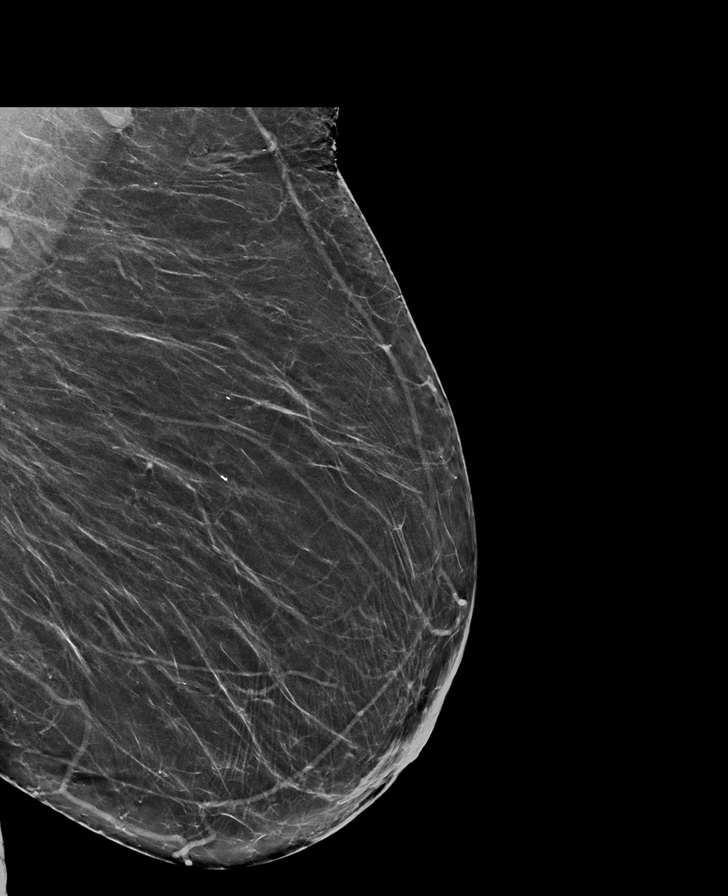

[R MLO synth-2D]
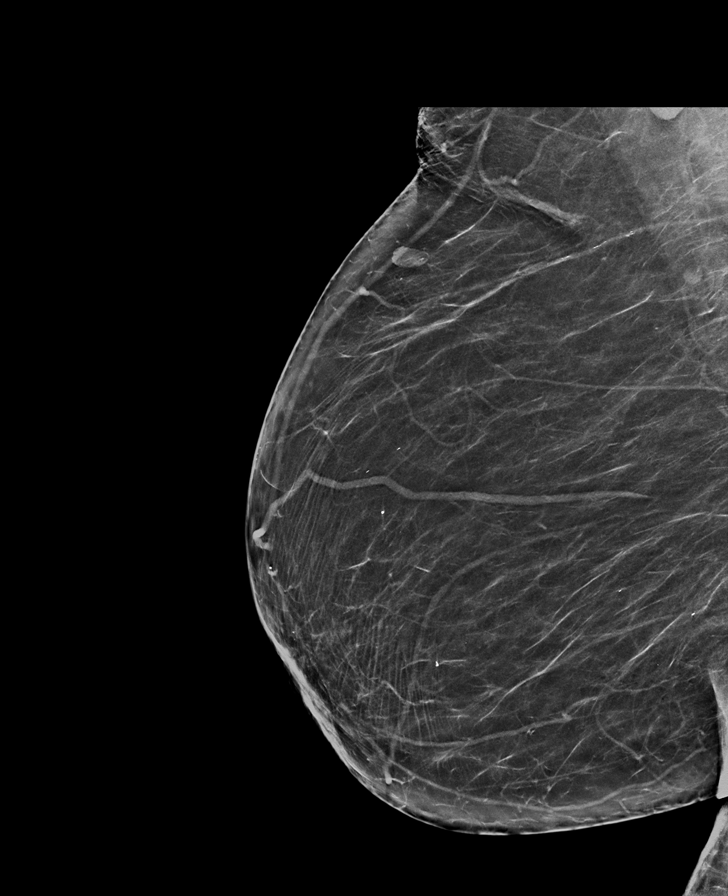

[L CC synth-2D]
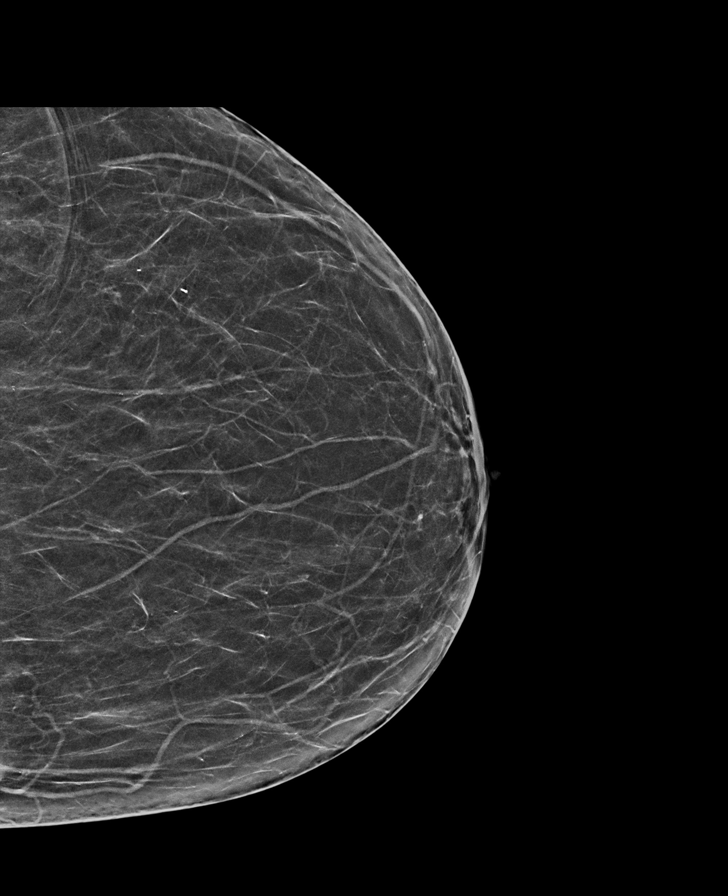

[R CC synth-2D]
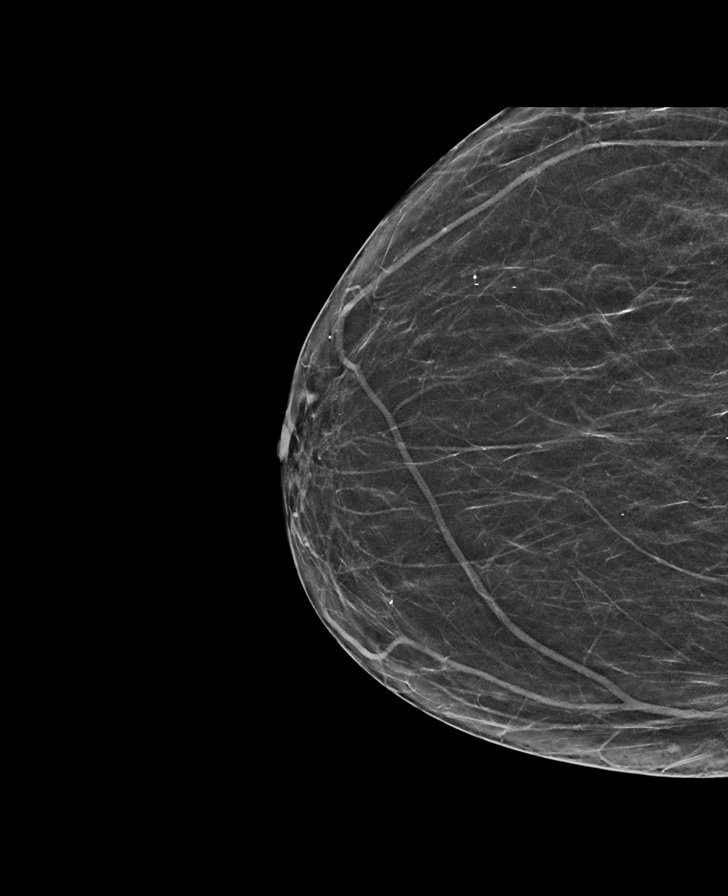

[L MLO tomo · tomo slice 35/68.0]
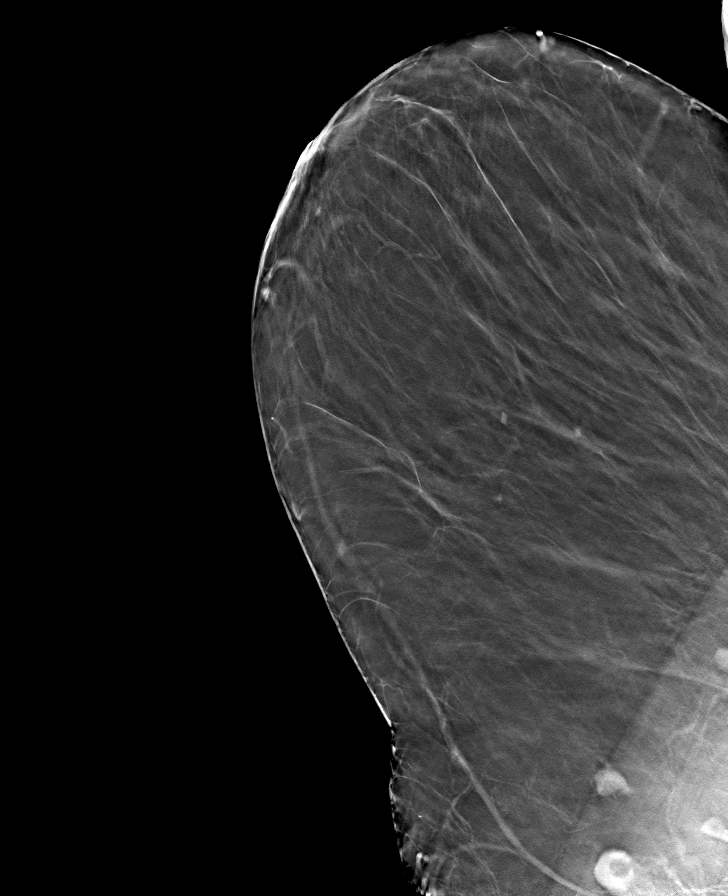

[R MLO tomo · tomo slice 35/68.0]
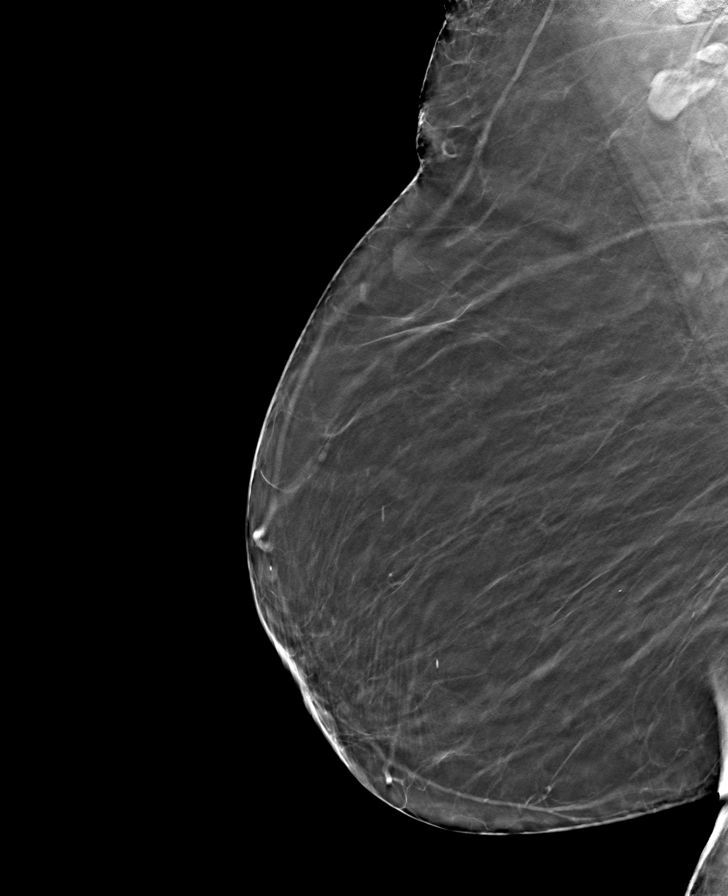

[L CC tomo · tomo slice 30/59.0]
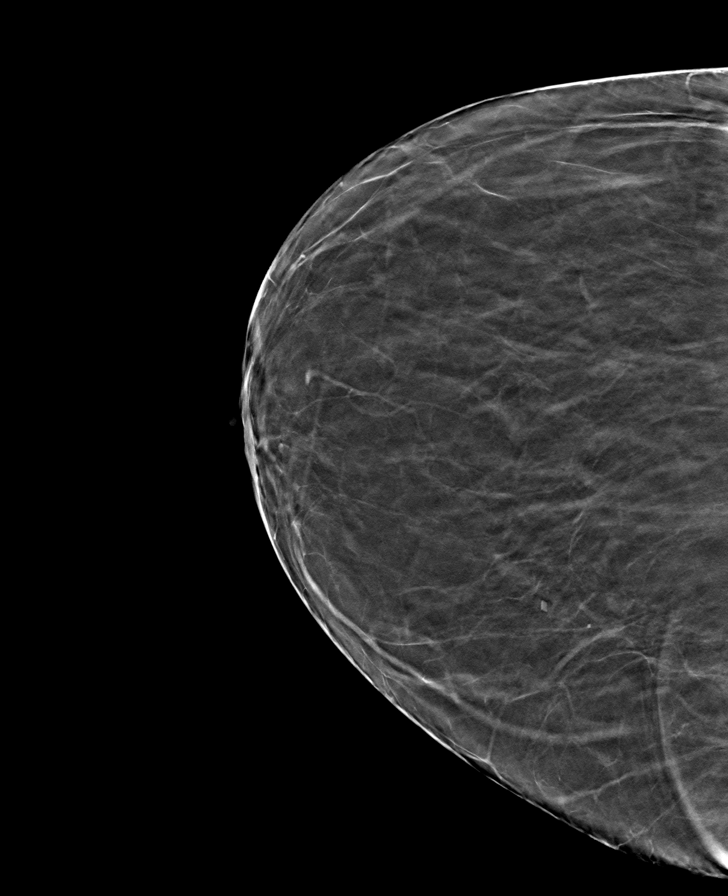

[R CC tomo · tomo slice 29/56.0]
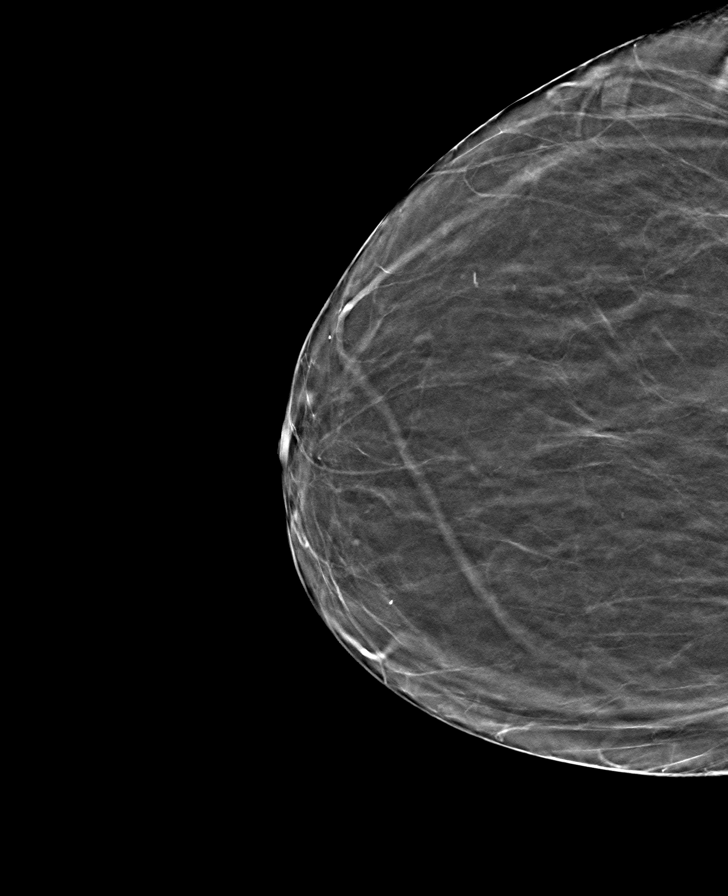

[8 of 24 positions shown; findings below may reference images not displayed]

FINDINGS: There are no findings suspicious for malignancy.
IMPRESSION: No mammographic evidence of malignancy. A result letter of this
screening mammogram will be mailed directly to the patient.

RECOMMENDATION:
Screening mammogram in one year. (Code:0E-3-N98)

BI-RADS CATEGORY  1: Negative.

## 2023-05-18 ENCOUNTER — Emergency Department
Admission: EM | Admit: 2023-05-18 | Discharge: 2023-05-18 | Disposition: A | Discharge status: Home / Self Care | Payer: Medicare Other | Attending: Emergency Medicine | Admitting: Emergency Medicine

## 2023-05-18 ENCOUNTER — Emergency Department: Payer: Medicare Other

## 2023-05-18 ENCOUNTER — Other Ambulatory Visit: Payer: Self-pay

## 2023-05-18 DIAGNOSIS — I700 Atherosclerosis of aorta: Secondary | ICD-10-CM | POA: Diagnosis not present

## 2023-05-18 DIAGNOSIS — R519 Headache, unspecified: Secondary | ICD-10-CM | POA: Diagnosis present

## 2023-05-18 DIAGNOSIS — Z7901 Long term (current) use of anticoagulants: Secondary | ICD-10-CM | POA: Insufficient documentation

## 2023-05-18 DIAGNOSIS — R42 Dizziness and giddiness: Secondary | ICD-10-CM | POA: Diagnosis not present

## 2023-05-18 DIAGNOSIS — Z1152 Encounter for screening for COVID-19: Secondary | ICD-10-CM | POA: Diagnosis not present

## 2023-05-18 DIAGNOSIS — I493 Ventricular premature depolarization: Secondary | ICD-10-CM | POA: Insufficient documentation

## 2023-05-18 DIAGNOSIS — Z79899 Other long term (current) drug therapy: Secondary | ICD-10-CM | POA: Insufficient documentation

## 2023-05-18 DIAGNOSIS — R1084 Generalized abdominal pain: Secondary | ICD-10-CM | POA: Insufficient documentation

## 2023-05-18 DIAGNOSIS — N189 Chronic kidney disease, unspecified: Secondary | ICD-10-CM | POA: Diagnosis not present

## 2023-05-18 DIAGNOSIS — I130 Hypertensive heart and chronic kidney disease with heart failure and stage 1 through stage 4 chronic kidney disease, or unspecified chronic kidney disease: Secondary | ICD-10-CM | POA: Insufficient documentation

## 2023-05-18 DIAGNOSIS — I2510 Atherosclerotic heart disease of native coronary artery without angina pectoris: Secondary | ICD-10-CM | POA: Diagnosis not present

## 2023-05-18 DIAGNOSIS — E039 Hypothyroidism, unspecified: Secondary | ICD-10-CM | POA: Insufficient documentation

## 2023-05-18 DIAGNOSIS — Z8673 Personal history of transient ischemic attack (TIA), and cerebral infarction without residual deficits: Secondary | ICD-10-CM | POA: Insufficient documentation

## 2023-05-18 DIAGNOSIS — I509 Heart failure, unspecified: Secondary | ICD-10-CM | POA: Diagnosis not present

## 2023-05-18 LAB — CBC
HCT: 42.4 % (ref 36.0–46.0)
Hemoglobin: 13.7 g/dL (ref 12.0–15.0)
MCH: 28.5 pg (ref 26.0–34.0)
MCHC: 32.3 g/dL (ref 30.0–36.0)
MCV: 88.3 fL (ref 80.0–100.0)
Platelets: 220 10*3/uL (ref 150–400)
RBC: 4.8 MIL/uL (ref 3.87–5.11)
RDW: 15.4 % (ref 11.5–15.5)
WBC: 4.6 10*3/uL (ref 4.0–10.5)
nRBC: 0 % (ref 0.0–0.2)

## 2023-05-18 LAB — URINALYSIS, ROUTINE W REFLEX MICROSCOPIC
Bacteria, UA: NONE SEEN
Bilirubin Urine: NEGATIVE
Glucose, UA: NEGATIVE mg/dL
Ketones, ur: NEGATIVE mg/dL
Nitrite: NEGATIVE
Protein, ur: NEGATIVE mg/dL
Specific Gravity, Urine: 1.004 — ABNORMAL LOW (ref 1.005–1.030)
pH: 6 (ref 5.0–8.0)

## 2023-05-18 LAB — BASIC METABOLIC PANEL
Anion gap: 7 (ref 5–15)
BUN: 15 mg/dL (ref 8–23)
CO2: 25 mmol/L (ref 22–32)
Calcium: 9.8 mg/dL (ref 8.9–10.3)
Chloride: 103 mmol/L (ref 98–111)
Creatinine, Ser: 1.01 mg/dL — ABNORMAL HIGH (ref 0.44–1.00)
GFR, Estimated: 54 mL/min — ABNORMAL LOW (ref >60)
Glucose, Bld: 149 mg/dL — ABNORMAL HIGH (ref 70–99)
Potassium: 3.5 mmol/L (ref 3.5–5.1)
Sodium: 135 mmol/L (ref 135–145)

## 2023-05-18 LAB — SARS CORONAVIRUS 2 BY RT PCR: SARS Coronavirus 2 by RT PCR: NEGATIVE

## 2023-05-18 MED ORDER — METOCLOPRAMIDE HCL 5 MG/ML IJ SOLN
5.0000 mg | Freq: Once | INTRAMUSCULAR | Status: AC
  Administered 2023-05-18: 5 mg via INTRAVENOUS
  Filled 2023-05-18: qty 2

## 2023-05-18 MED ORDER — KETOROLAC TROMETHAMINE 15 MG/ML IJ SOLN: 15.0000 mg | Freq: Once | INTRAMUSCULAR | Status: DC

## 2023-05-18 MED ORDER — SODIUM CHLORIDE 0.9 % IV BOLUS
1000.0000 mL | Freq: Once | INTRAVENOUS | Status: AC
  Administered 2023-05-18: 1000 mL via INTRAVENOUS

## 2023-05-18 MED ORDER — KETOROLAC TROMETHAMINE 15 MG/ML IJ SOLN
7.5000 mg | Freq: Once | INTRAMUSCULAR | Status: AC
  Administered 2023-05-18: 7.5 mg via INTRAVENOUS
  Filled 2023-05-18: qty 1

## 2023-05-18 MED ORDER — IOHEXOL 300 MG/ML  SOLN
100.0000 mL | Freq: Once | INTRAMUSCULAR | Status: AC | PRN
  Administered 2023-05-18: 100 mL via INTRAVENOUS

## 2023-05-18 MED ORDER — ACETAMINOPHEN 500 MG PO TABS
1000.0000 mg | ORAL_TABLET | Freq: Once | ORAL | Status: AC
  Administered 2023-05-18: 1000 mg via ORAL
  Filled 2023-05-18: qty 2

## 2023-05-18 NOTE — Discharge Instructions (Addendum)
Please take Tylenol 1000 mg every 8 hours as needed for your headache.  Make sure you are eating well and drinking lots of fluids.  Please use a walker as needed for ambulation.  Please call your PCP and schedule follow-up appointment in 2 to 3 days for recheck.  You may also benefit from physical therapy to help with chronic balance issues.  Return to the ER for any worsening symptoms or any urgent changes in her health.

## 2023-05-18 NOTE — ED Triage Notes (Addendum)
Pt presents to the ED with a headache that goes across her forehead that started 2-3 days ago, but worsened last night. Pt states that she has had a little bit of dizziness. Pt also reports generalized weakness. Pt here with nephew. Pt has not taken anything for the pain. Pt does have some dementia per nephew. Pt A&O to person, place, and situation.

## 2023-05-18 NOTE — ED Provider Notes (Signed)
EMERGENCY DEPARTMENT AT [facility] Provider Note   CSN: [id number] Arrival date & time: 05/18/23  1349     History  Chief Complaint  Patient presents with   Headache   Weakness    Andrea Phillips is a 88 y.o. female with history of CVA, 2023, coronary artery disease, chronic heart failure, syncope, atrial fibrillation, chronic kidney disease, hypertension, hypothyroidism, hypokalemia presents to the emergency department valuation of 3 days of headache.  She describes frontal headache pain gradual in onset and has been increasing over the last few days.  Uncertain if she take any medications.  She lives at home alone and is a caretaker for herself.  Son states that she has been complaining of severe headache, dizziness and chills.  No other symptoms such as chest pain shortness of breath, abdominal pain nausea vomiting or diarrhea.  No fevers or urinary symptoms.  HPI     Home Medications Prior to Admission medications   Medication Sig Start Date End Date Taking? Authorizing Provider  albuterol (VENTOLIN HFA) 108 (90 Base) MCG/ACT inhaler Inhale 2 puffs into the lungs every 6 (six) hours as needed for wheezing or shortness of breath.    [provider]  amiodarone (PACERONE) 200 MG tablet Take 1 tablet (200 mg total) by mouth daily. 02/12/22   Arnetha Courser, MD  apixaban (ELIQUIS) 5 MG TABS tablet Take 1 tablet (5 mg total) by mouth 2 (two) times daily. 02/11/22   Arnetha Courser, MD  cetirizine (ZYRTEC) 10 MG tablet Take 10 mg by mouth daily.    [provider]  donepezil (ARICEPT) 5 MG tablet Take 5 mg by mouth daily. 11/24/22   [provider]  fluticasone (FLONASE) 50 MCG/ACT nasal spray Place 1 spray into both nostrils 2 (two) times daily as needed for allergies or rhinitis.    [provider]  furosemide (LASIX) 40 MG tablet Take 0.5 tablets (20 mg total) by mouth daily. 02/03/23   Lurene Shadow, MD  icosapent Ethyl (VASCEPA)  1 g capsule TAKE 2 CAPSULES BY MOUTH TWICE DAILY WITH MEALS 12/24/22   Margaretann Loveless, MD  isosorbide mononitrate (IMDUR) 60 MG 24 hr tablet Take 1 tablet (60 mg total) by mouth daily. 02/03/23   Lurene Shadow, MD  levothyroxine (SYNTHROID) 125 MCG tablet Take 1 tablet (125 mcg total) by mouth daily. 02/27/23 02/27/24  Margaretann Loveless, MD  mometasone-formoterol (DULERA) 200-5 MCG/ACT AERO Inhale 2 puffs into the lungs 2 (two) times daily. 04/04/21   Almon Hercules, MD  omeprazole (PRILOSEC) 40 MG capsule TAKE 1 CAPSULE BY MOUTH ONCE DAILY 05/25/23   Miki Kins, FNP  potassium chloride (KLOR-CON M) 10 MEQ tablet Take 2 tablets (20 mEq total) by mouth 2 (two) times daily. 02/27/23   Margaretann Loveless, MD  pravastatin (PRAVACHOL) 40 MG tablet TAKE 1 TABLET(40 MG) BY MOUTH DAILY AT 6 PM 03/13/23   Margaretann Loveless, MD  telmisartan (MICARDIS) 80 MG tablet Take 80 mg by mouth daily. 11/01/21   [provider]      Allergies    Aspirin, Other, Atorvastatin, Codeine, Conj estrog-medroxyprogest ace, Prednisone, Raloxifene, Ace inhibitors, Amlodipine, and Valsartan    Review of Systems   Review of Systems  Physical Exam Updated Vital Signs BP (!) 174/84 (BP Location: Right Arm)   Pulse 64   Temp 97.7 F (36.5 C) (Oral)   Resp 18   Ht 5\' 1"  (1.549 m)   SpO2 95%   BMI  25.32 kg/m  Physical Exam  ED Results / Procedures / Treatments   Labs (all labs ordered are listed, but only abnormal results are displayed) Labs Reviewed  BASIC METABOLIC PANEL - Abnormal; Notable for the following components:      Result Value   Glucose, Bld 149 (*)    Creatinine, Ser 1.01 (*)    GFR, Estimated 54 (*)    All other components within normal limits  URINALYSIS, ROUTINE W REFLEX MICROSCOPIC - Abnormal; Notable for the following components:   Color, Urine YELLOW (*)    APPearance CLEAR (*)    Specific Gravity, Urine 1.004 (*)    Hgb urine dipstick MODERATE (*)    Leukocytes,Ua TRACE (*)    All other components  within normal limits  SARS CORONAVIRUS 2 BY RT PCR  CBC    EKG EKG Interpretation Date/Time:  Thursday May 18 2023 14:05:25 EDT Ventricular Rate:  61 PR Interval:    QRS Duration:  104 QT Interval:  356 QTC Calculation: 358 R Axis:   -84  Text Interpretation: Junctional rhythm with frequent ventricular-paced complexes Left axis deviation Anterior infarct , age undetermined ST & T wave abnormality, consider lateral ischemia Abnormal ECG When compared with ECG of 31-Jan-2023 19:06, Premature ventricular complexes are no longer Present Confirmed by UNCONFIRMED, DOCTOR ([id number]), editor Fredric Mare, Tammy [phone]) on 05/19/2023 1:32:10 PM  Radiology No results found.  Procedures Procedures    Medications Ordered in ED Medications  sodium chloride 0.9 % bolus 1,000 mL (0 mLs Intravenous Stopped 05/18/23 1839)  iohexol (OMNIPAQUE) 300 MG/ML solution 100 mL (100 mLs Intravenous Contrast Given 05/18/23 1644)  acetaminophen (TYLENOL) tablet 1,000 mg (1,000 mg Oral Given 05/18/23 1706)  metoCLOPramide (REGLAN) injection 5 mg (5 mg Intravenous Given 05/18/23 1831)  ketorolac (TORADOL) 15 MG/ML injection 7.5 mg (7.5 mg Intravenous Given 05/18/23 1831)    ED Course/ Medical Decision Making/ A&P                             Medical Decision Making Amount and/or Complexity of Data Reviewed Labs: ordered. Radiology: ordered.  Risk OTC drugs. Prescription drug management.   88 year old female with headache, abdominal pain.  History of CVA.  No new stroke symptoms.  CT of the head negative.  Headache consistent with previous migraine headaches.  Patient with normal blood work and urinalysis.  COVID test negative.  No signs of any infectious process.  A CT scan of the abdomen pelvis was obtained and negative for any acute intra-abdominal process.  Patient was given IV fluids, Toradol, Tylenol and saw improvement and resolution of headache.  Patient able to stand and ambulate back at baseline.  Patient  stable and ready for discharge to home. Final Clinical Impression(s) / ED Diagnoses Final diagnoses:  Bad headache  Dizziness  Generalized abdominal pain    Rx / DC Orders ED Discharge Orders     None         Evon Slack, PA-C 06/05/23 1028    Merwyn Katos, MD 06/05/23 1510

## 2023-05-25 ENCOUNTER — Other Ambulatory Visit: Payer: Self-pay | Admitting: Internal Medicine

## 2023-06-15 ENCOUNTER — Other Ambulatory Visit: Payer: Self-pay | Admitting: Internal Medicine

## 2023-06-15 DIAGNOSIS — E782 Mixed hyperlipidemia: Secondary | ICD-10-CM

## 2023-06-16 ENCOUNTER — Ambulatory Visit (INDEPENDENT_AMBULATORY_CARE_PROVIDER_SITE_OTHER): Payer: Medicare Other | Admitting: Internal Medicine

## 2023-06-16 ENCOUNTER — Encounter: Payer: Self-pay | Admitting: Internal Medicine

## 2023-06-16 VITALS — BP 156/78 | HR 71 | Ht 61.0 in | Wt 125.0 lb

## 2023-06-16 DIAGNOSIS — R413 Other amnesia: Secondary | ICD-10-CM | POA: Insufficient documentation

## 2023-06-16 DIAGNOSIS — I482 Chronic atrial fibrillation, unspecified: Secondary | ICD-10-CM

## 2023-06-16 DIAGNOSIS — I2510 Atherosclerotic heart disease of native coronary artery without angina pectoris: Secondary | ICD-10-CM

## 2023-06-16 DIAGNOSIS — I10 Essential (primary) hypertension: Secondary | ICD-10-CM

## 2023-06-16 DIAGNOSIS — E038 Other specified hypothyroidism: Secondary | ICD-10-CM

## 2023-06-16 MED ORDER — TELMISARTAN 80 MG PO TABS
80.0000 mg | ORAL_TABLET | Freq: Every day | ORAL | 3 refills | Status: AC
Start: 2023-06-16 — End: ?

## 2023-06-16 MED ORDER — DONEPEZIL HCL 10 MG PO TABS: 10.0000 mg | ORAL_TABLET | Freq: Every day | ORAL | 2 refills | Status: DC

## 2023-06-16 MED ORDER — ISOSORBIDE MONONITRATE ER 60 MG PO TB24: 60.0000 mg | ORAL_TABLET | Freq: Every day | ORAL | 3 refills | Status: DC

## 2023-06-16 NOTE — Progress Notes (Signed)
Patient comes in for follow-up accompanied by her niece.  Established Patient Office Visit  Subjective:  Patient ID: Andrea Phillips, female    DOB: 11/17/1934  Age: 88 y.o. MRN: [id number]  Chief Complaint  Patient presents with   Follow-up    3 month follow up    Patient comes in for follow-up accompanied by her family member.  She has lost more weight and her blood pressure is high.  Mentions that she is out of some of her medications and needs refills.  Family member is not sure that she even takes the medicines that she has at home.  Patient also has Ensure cans which she drinks once a day at least, and states she is eating but still not sure how much.  She lives at her own house independently and her granddaughter lives with her. Her medications have been sent again to the pharmacy and patient will return in 3 weeks to check on blood pressure and weight.    No other concerns at this time.   Past Medical History:  Diagnosis Date   Arthritis    ra   Asthma    CHF (congestive heart failure) (HCC)    Dysrhythmia    Edema extremities    GERD (gastroesophageal reflux disease)    Gout    Headache    History of hiatal hernia    Hypertension    Hypothyroidism    Shortness of breath dyspnea    Sleep apnea     Past Surgical History:  Procedure Laterality Date   APPENDECTOMY     BACK SURGERY     CARDIAC CATHETERIZATION     CATARACT EXTRACTION W/PHACO Left 03/09/2015   Procedure: CATARACT EXTRACTION PHACO AND INTRAOCULAR LENS PLACEMENT (IOC);  Surgeon: Sallee Lange, MD;  Location: [facility];  Service: Ophthalmology;  Laterality: Left;  [place] 01:31 AP% 26.1 CDE 43.72   CATARACT EXTRACTION W/PHACO Right 08/19/2021   Procedure: CATARACT EXTRACTION PHACO AND INTRAOCULAR LENS PLACEMENT (IOC) RIGHT;  Surgeon: Galen Manila, MD;  Location: [facility];  Service: Ophthalmology;  Laterality: Right;  12.31 1:07.0   CATARACT EXTRACTION W/PHACO Right 09/07/2021   Procedure: REMOVAL  OF LENS FRAGMENTS RIGHT;  Surgeon: Galen Manila, MD;  Location: [facility];  Service: Ophthalmology;  Laterality: Right;   CHOLECYSTECTOMY     CORONARY/GRAFT ACUTE MI REVASCULARIZATION N/A 10/24/2021   Procedure: Coronary/Graft Acute MI Revascularization;  Surgeon: Yvonne Kendall, MD;  Location: [facility];  Service: Cardiovascular;  Laterality: N/A;   EYE SURGERY     retina   JOINT REPLACEMENT     left knee   LEFT HEART CATH AND CORONARY ANGIOGRAPHY N/A 04/02/2021   Procedure: LEFT HEART CATH AND CORONARY ANGIOGRAPHY;  Surgeon: Lamar Blinks, MD;  Location: [facility];  Service: Cardiovascular;  Laterality: N/A;   LEFT HEART CATH AND CORONARY ANGIOGRAPHY N/A 10/24/2021   Procedure: LEFT HEART CATH AND CORONARY ANGIOGRAPHY;  Surgeon: Yvonne Kendall, MD;  Location: [facility];  Service: Cardiovascular;  Laterality: N/A;   PACEMAKER LEADLESS INSERTION N/A 07/26/2022   Procedure: PACEMAKER LEADLESS INSERTION;  Surgeon: Marcina Millard, MD;  Location: [facility];  Service: Cardiovascular;  Laterality: N/A;    Social History   Socioeconomic History   Marital status: Widowed    Spouse name: Not on file   Number of children: Not on file   Years of education: Not on file   Highest education level: Not on file  Occupational History  Not on file  Tobacco Use   Smoking status: Never   Smokeless tobacco: Never  Vaping Use   Vaping status: Never Used  Substance and Sexual Activity   Alcohol use: No   Drug use: No   Sexual activity: Not Currently  Other Topics Concern   Not on file  Social History Narrative   Lives with granddaughter, [name], and family.   Social Determinants of Health   Financial Resource Strain: Not on file  Food Insecurity: No Food Insecurity (02/01/2023)   Hunger Vital Sign    Worried About Running Out of Food in the Last Year: Never true    Ran Out of Food in the Last Year: Never true   Transportation Needs: No Transportation Needs (02/01/2023)   PRAPARE - Administrator, Civil Service (Medical): No    Lack of Transportation (Non-Medical): No  Physical Activity: Not on file  Stress: Not on file  Social Connections: Not on file  Intimate Partner Violence: Not At Risk (02/01/2023)   Humiliation, Afraid, Rape, and Kick questionnaire    Fear of Current or Ex-Partner: No    Emotionally Abused: No    Physically Abused: No    Sexually Abused: No    Family History  Problem Relation Age of Onset   Heart disease Mother    Heart disease Father    Breast cancer Cousin     Allergies  Allergen Reactions   Aspirin Hives   Other Hives    Yellow dye 6 FOOD COLOR YELLOW POWDER    Atorvastatin Other (See Comments)    unknown   Codeine Other (See Comments)    Doesn't know   Conj Estrog-Medroxyprogest Ace Other (See Comments)    PREMPRO 0.3-1.5 MG ORAL TABLET unknown   Prednisone Other (See Comments)    Chest pain   Raloxifene Other (See Comments)    EVISTA 60 MG ORAL TABLET unknown   Ace Inhibitors Cough   Amlodipine Itching   Valsartan Itching    Review of Systems  Constitutional:  Positive for malaise/fatigue and weight loss. Negative for chills and fever.  HENT: Negative.  Negative for congestion and sore throat.   Eyes: Negative.   Respiratory: Negative.  Negative for cough and shortness of breath.   Cardiovascular: Negative.  Negative for chest pain, palpitations and leg swelling.  Gastrointestinal: Negative.  Negative for abdominal pain, constipation, diarrhea, heartburn, nausea and vomiting.  Genitourinary: Negative.  Negative for dysuria and flank pain.  Musculoskeletal: Negative.  Negative for joint pain and myalgias.  Skin: Negative.   Neurological: Negative.  Negative for dizziness and headaches.  Endo/Heme/Allergies: Negative.   Psychiatric/Behavioral: Negative.  Negative for depression and suicidal ideas. The patient is not  nervous/anxious.      Objective:   BP (!) 156/78   Pulse 71   Ht 5\' 1"  (1.549 m)   Wt 125 lb (56.7 kg)   SpO2 96%   BMI 23.62 kg/m   Vitals:   06/16/23 1411  BP: (!) 156/78  Pulse: 71  Height: 5\' 1"  (1.549 m)  Weight: 125 lb (56.7 kg)  SpO2: 96%  BMI (Calculated): 23.63    Physical Exam Vitals and nursing note reviewed.  Constitutional:      Appearance: Normal appearance.  HENT:     Head: Normocephalic and atraumatic.     Nose: Nose normal.     Mouth/Throat:     Mouth: Mucous membranes are moist.     Pharynx: Oropharynx is clear.  Eyes:     Conjunctiva/sclera: Conjunctivae normal.     Pupils: Pupils are equal, round, and reactive to light.  Cardiovascular:     Rate and Rhythm: Normal rate and regular rhythm.     Pulses: Normal pulses.     Heart sounds: Normal heart sounds. No murmur heard. Pulmonary:     Effort: Pulmonary effort is normal.     Breath sounds: Normal breath sounds. No wheezing.  Abdominal:     General: Bowel sounds are normal.     Palpations: Abdomen is soft.     Tenderness: There is no abdominal tenderness. There is no right CVA tenderness or left CVA tenderness.  Musculoskeletal:        General: Normal range of motion.     Cervical back: Normal range of motion.     Right lower leg: No edema.     Left lower leg: No edema.  Skin:    General: Skin is warm and dry.  Neurological:     General: No focal deficit present.     Mental Status: She is alert and oriented to person, place, and time.  Psychiatric:        Mood and Affect: Mood normal.        Behavior: Behavior normal.      No results found for any visits on 06/16/23.      Assessment & Plan:  Meds refilled.  They will start putting it in the pillbox to make sure she takes it every day. Will check labs at follow-up. Problem List Items Addressed This Visit     Essential hypertension - Primary   Relevant Medications   isosorbide mononitrate (IMDUR) 60 MG 24 hr tablet    telmisartan (MICARDIS) 80 MG tablet   Hypothyroidism   Coronary artery disease involving native coronary artery of native heart without angina pectoris   Relevant Medications   isosorbide mononitrate (IMDUR) 60 MG 24 hr tablet   telmisartan (MICARDIS) 80 MG tablet   Chronic atrial fibrillation (HCC)   Relevant Medications   isosorbide mononitrate (IMDUR) 60 MG 24 hr tablet   telmisartan (MICARDIS) 80 MG tablet   Memory impairment   Relevant Medications   donepezil (ARICEPT) 10 MG tablet    Return in about 3 weeks (around 07/07/2023).   Total time spent: 30 minutes  Margaretann Loveless, MD  06/16/2023   This document may have been prepared by [facility] Voice Recognition software and as such may include unintentional dictation errors.

## 2023-06-20 ENCOUNTER — Encounter: Payer: Self-pay | Admitting: Internal Medicine

## 2023-06-20 ENCOUNTER — Other Ambulatory Visit: Payer: Self-pay | Admitting: Internal Medicine

## 2023-06-20 DIAGNOSIS — E876 Hypokalemia: Secondary | ICD-10-CM

## 2023-06-20 MED ORDER — POTASSIUM CHLORIDE CRYS ER 10 MEQ PO TBCR
20.0000 meq | EXTENDED_RELEASE_TABLET | Freq: Two times a day (BID) | ORAL | 3 refills | Status: DC
Start: 2023-06-20 — End: 2024-07-26

## 2023-07-13 ENCOUNTER — Ambulatory Visit: Payer: Medicare Other | Admitting: Internal Medicine

## 2023-07-20 ENCOUNTER — Telehealth: Payer: Self-pay | Admitting: Internal Medicine

## 2023-07-20 NOTE — Telephone Encounter (Signed)
Clydie Braun left VM wanting to discuss some concerns about the patient prior to tomorrow's appt since she will not be able to be at the appt.

## 2023-07-21 ENCOUNTER — Ambulatory Visit: Payer: Medicare Other | Admitting: Internal Medicine

## 2023-07-28 ENCOUNTER — Ambulatory Visit: Payer: Medicare Other | Admitting: Internal Medicine

## 2023-07-28 ENCOUNTER — Encounter: Payer: Self-pay | Admitting: Internal Medicine

## 2023-07-28 VITALS — BP 132/72 | HR 60 | Ht 60.0 in | Wt 128.4 lb

## 2023-07-28 DIAGNOSIS — I10 Essential (primary) hypertension: Secondary | ICD-10-CM

## 2023-07-28 DIAGNOSIS — I2510 Atherosclerotic heart disease of native coronary artery without angina pectoris: Secondary | ICD-10-CM

## 2023-07-28 DIAGNOSIS — Z23 Encounter for immunization: Secondary | ICD-10-CM | POA: Diagnosis not present

## 2023-07-28 DIAGNOSIS — M79604 Pain in right leg: Secondary | ICD-10-CM

## 2023-07-28 DIAGNOSIS — L84 Corns and callosities: Secondary | ICD-10-CM

## 2023-07-28 DIAGNOSIS — I5022 Chronic systolic (congestive) heart failure: Secondary | ICD-10-CM

## 2023-07-28 DIAGNOSIS — I482 Chronic atrial fibrillation, unspecified: Secondary | ICD-10-CM

## 2023-07-28 NOTE — Progress Notes (Signed)
Established Patient Office Visit  Subjective:  Patient ID: Andrea Phillips, female    DOB: 03-14-1935  Age: 88 y.o. MRN: [id number]  Chief Complaint  Patient presents with   Follow-up    3 week follow up    Patient comes in for her follow-up.  She has history of being lightheaded and little dizzy but recently she fell.  Family was concerned that she hurt her left leg and may need to be x-rayed.  Patient today reports that she is able to bear weight at her left hip, left knee and left ankle without any problem, but does have chronic arthritic aches and pains and joint stiffness. On exam her left foot toes are tender as she has multiple corns and calluses along with a bunion. Will set up podiatry referral. She will follow-up with her cardiologist for her A-fib. Today her blood pressure is stable. She is due for flu shot.    No other concerns at this time.   Past Medical History:  Diagnosis Date   Arthritis    ra   Asthma    CHF (congestive heart failure) (HCC)    Dysrhythmia    Edema extremities    GERD (gastroesophageal reflux disease)    Gout    Headache    History of hiatal hernia    Hypertension    Hypothyroidism    Shortness of breath dyspnea    Sleep apnea     Past Surgical History:  Procedure Laterality Date   APPENDECTOMY     BACK SURGERY     CARDIAC CATHETERIZATION     CATARACT EXTRACTION W/PHACO Left 03/09/2015   Procedure: CATARACT EXTRACTION PHACO AND INTRAOCULAR LENS PLACEMENT (IOC);  Surgeon: Sallee Lange, MD;  Location: [facility];  Service: Ophthalmology;  Laterality: Left;  [place] 01:31 AP% 26.1 CDE 43.72   CATARACT EXTRACTION W/PHACO Right 08/19/2021   Procedure: CATARACT EXTRACTION PHACO AND INTRAOCULAR LENS PLACEMENT (IOC) RIGHT;  Surgeon: Galen Manila, MD;  Location: [facility];  Service: Ophthalmology;  Laterality: Right;  12.31 1:07.0   CATARACT EXTRACTION W/PHACO Right 09/07/2021   Procedure: REMOVAL OF LENS FRAGMENTS RIGHT;  Surgeon:  Galen Manila, MD;  Location: [facility];  Service: Ophthalmology;  Laterality: Right;   CHOLECYSTECTOMY     CORONARY/GRAFT ACUTE MI REVASCULARIZATION N/A 10/24/2021   Procedure: Coronary/Graft Acute MI Revascularization;  Surgeon: Yvonne Kendall, MD;  Location: [facility];  Service: Cardiovascular;  Laterality: N/A;   EYE SURGERY     retina   JOINT REPLACEMENT     left knee   LEFT HEART CATH AND CORONARY ANGIOGRAPHY N/A 04/02/2021   Procedure: LEFT HEART CATH AND CORONARY ANGIOGRAPHY;  Surgeon: Lamar Blinks, MD;  Location: [facility];  Service: Cardiovascular;  Laterality: N/A;   LEFT HEART CATH AND CORONARY ANGIOGRAPHY N/A 10/24/2021   Procedure: LEFT HEART CATH AND CORONARY ANGIOGRAPHY;  Surgeon: Yvonne Kendall, MD;  Location: [facility];  Service: Cardiovascular;  Laterality: N/A;   PACEMAKER LEADLESS INSERTION N/A 07/26/2022   Procedure: PACEMAKER LEADLESS INSERTION;  Surgeon: Marcina Millard, MD;  Location: [facility];  Service: Cardiovascular;  Laterality: N/A;    Social History   Socioeconomic History   Marital status: Widowed    Spouse name: Not on file   Number of children: Not on file   Years of education: Not on file   Highest education level: Not on file  Occupational History   Not on file  Tobacco Use  Smoking status: Never   Smokeless tobacco: Never  Vaping Use   Vaping status: Never Used  Substance and Sexual Activity   Alcohol use: No   Drug use: No   Sexual activity: Not Currently  Other Topics Concern   Not on file  Social History Narrative   Lives with granddaughter, [name], and family.   Social Determinants of Health   Financial Resource Strain: Not on file  Food Insecurity: No Food Insecurity (02/01/2023)   Hunger Vital Sign    Worried About Running Out of Food in the Last Year: Never true    Ran Out of Food in the Last Year: Never true  Transportation Needs: No Transportation  Needs (02/01/2023)   PRAPARE - Administrator, Civil Service (Medical): No    Lack of Transportation (Non-Medical): No  Physical Activity: Not on file  Stress: Not on file  Social Connections: Not on file  Intimate Partner Violence: Not At Risk (02/01/2023)   Humiliation, Afraid, Rape, and Kick questionnaire    Fear of Current or Ex-Partner: No    Emotionally Abused: No    Physically Abused: No    Sexually Abused: No    Family History  Problem Relation Age of Onset   Heart disease Mother    Heart disease Father    Breast cancer Cousin     Allergies  Allergen Reactions   Aspirin Hives   Other Hives    Yellow dye 6 FOOD COLOR YELLOW POWDER    Atorvastatin Other (See Comments)    unknown   Codeine Other (See Comments)    Doesn't know   Conj Estrog-Medroxyprogest Ace Other (See Comments)    PREMPRO 0.3-1.5 MG ORAL TABLET unknown   Prednisone Other (See Comments)    Chest pain   Raloxifene Other (See Comments)    EVISTA 60 MG ORAL TABLET unknown   Ace Inhibitors Cough   Amlodipine Itching   Valsartan Itching    Review of Systems  Constitutional: Negative.  Negative for chills, diaphoresis, fever, malaise/fatigue and weight loss.  HENT: Negative.  Negative for ear pain and sore throat.   Eyes: Negative.   Respiratory: Negative.  Negative for cough and shortness of breath.   Cardiovascular: Negative.  Negative for chest pain, palpitations and leg swelling.  Gastrointestinal: Negative.  Negative for abdominal pain, constipation, diarrhea, heartburn, nausea and vomiting.  Genitourinary: Negative.  Negative for dysuria and flank pain.  Musculoskeletal:  Positive for falls and joint pain. Negative for myalgias.  Skin: Negative.   Neurological:  Positive for dizziness. Negative for headaches.  Endo/Heme/Allergies: Negative.   Psychiatric/Behavioral: Negative.  Negative for depression and suicidal ideas. The patient is not nervous/anxious.        Objective:    BP 132/72   Pulse 60   Ht 5' (1.524 m)   Wt 128 lb 6.4 oz (58.2 kg)   SpO2 96%   BMI 25.08 kg/m   Vitals:   07/28/23 1448  BP: 132/72  Pulse: 60  Height: 5' (1.524 m)  Weight: 128 lb 6.4 oz (58.2 kg)  SpO2: 96%  BMI (Calculated): 25.08    Physical Exam Vitals and nursing note reviewed.  Constitutional:      Appearance: Normal appearance.  HENT:     Head: Normocephalic and atraumatic.     Nose: Nose normal.     Mouth/Throat:     Mouth: Mucous membranes are moist.     Pharynx: Oropharynx is clear.  Eyes:  Conjunctiva/sclera: Conjunctivae normal.     Pupils: Pupils are equal, round, and reactive to light.  Cardiovascular:     Rate and Rhythm: Normal rate and regular rhythm.     Pulses: Normal pulses.     Heart sounds: Normal heart sounds. No murmur heard. Pulmonary:     Effort: Pulmonary effort is normal.     Breath sounds: Normal breath sounds. No wheezing.  Abdominal:     General: Bowel sounds are normal.     Palpations: Abdomen is soft.     Tenderness: There is no abdominal tenderness. There is no right CVA tenderness or left CVA tenderness.  Musculoskeletal:        General: Normal range of motion.     Cervical back: Normal range of motion.     Right lower leg: No edema.     Left lower leg: No edema.  Skin:    General: Skin is warm and dry.  Neurological:     General: No focal deficit present.     Mental Status: She is alert and oriented to person, place, and time.  Psychiatric:        Mood and Affect: Mood normal.        Behavior: Behavior normal.      No results found for any visits on 07/28/23.      Assessment & Plan:  Podiatry consult for left foot calluses. To continue all medications for now. Flu shot today. Problem List Items Addressed This Visit     Essential hypertension - Primary   Chronic HFrEF (heart failure with reduced ejection fraction)  with improved EF(HCC)   Coronary artery disease involving native coronary artery of  native heart without angina pectoris   Chronic atrial fibrillation (HCC)   Other Visit Diagnoses     Callus of foot       Relevant Orders   Ambulatory referral to Podiatry   Need for immunization against influenza       Relevant Orders   Flu Vaccine Trivalent High Dose (Fluad)   Leg pain, anterior, right           Return in about 2 months (around 09/27/2023).   Total time spent: 30 minutes  Margaretann Loveless, MD  07/28/2023   This document may have been prepared by [facility] Voice Recognition software and as such may include unintentional dictation errors.

## 2023-08-02 IMAGING — CT CT CHEST W/ CM
2 of 4 series · 15 of 36 positions shown, 18 images · IV contrast (omnipaque)
Comparison: 04/07/2020 CT chest, chest radiographs 08/29/2021

CLINICAL DATA: Follow-up pneumonia, productive cough and congestion
since August 2021

EXAM:
CT CHEST WITH CONTRAST
TECHNIQUE: Multidetector CT imaging of the chest was performed during
intravenous contrast administration.
CONTRAST:  75mL OMNIPAQUE IOHEXOL 300 MG/ML  SOLN IV

[Series 2: axial chest 2.00 · axial · 0.58mm/px · z∈[-1191,-921]mm · 12 of 161 slices shown, 15 images]
[im 13/161  mediastinal]
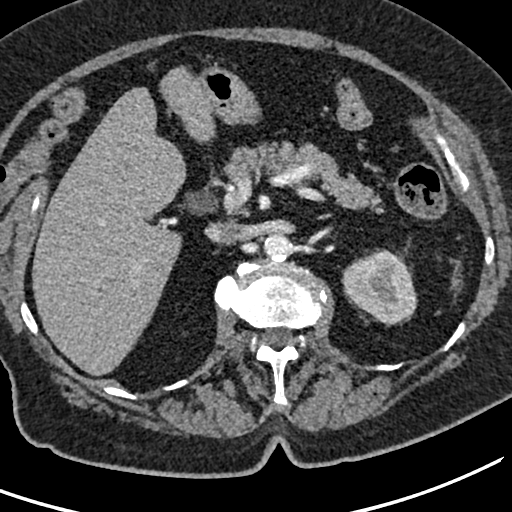
[im 13/161  lung]
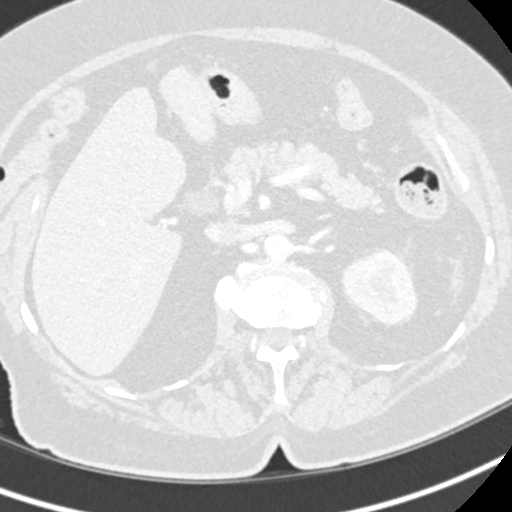
[im 25/161  lung]
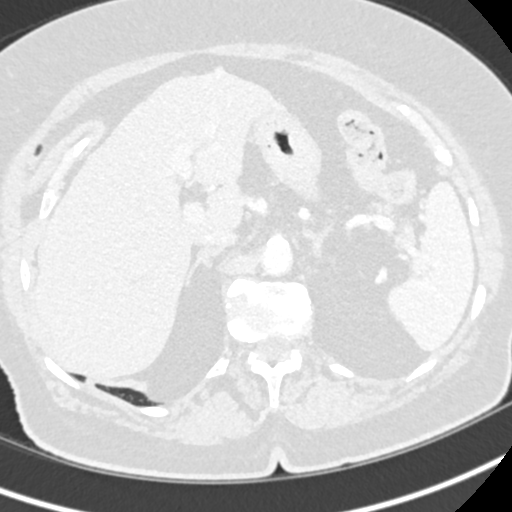
[im 37/161  lung]
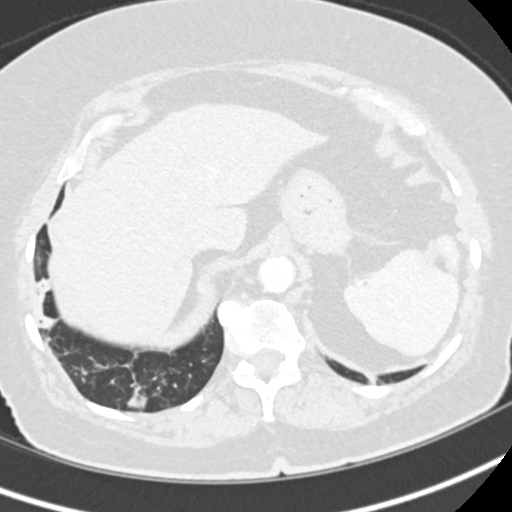
[im 50/161  lung]
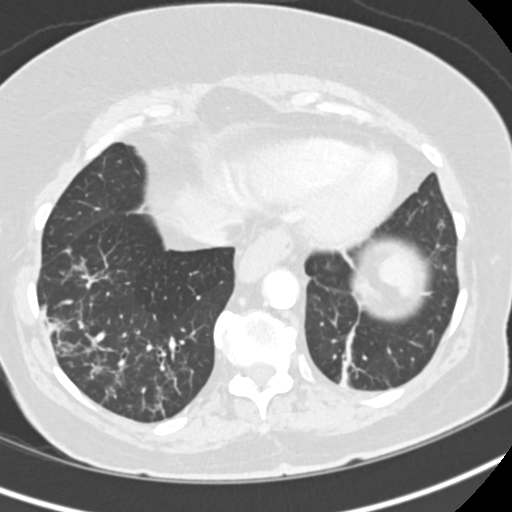
[im 62/161  mediastinal]
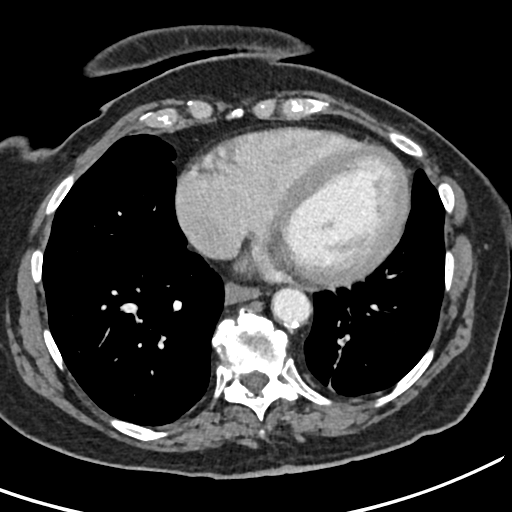
[im 62/161  lung]
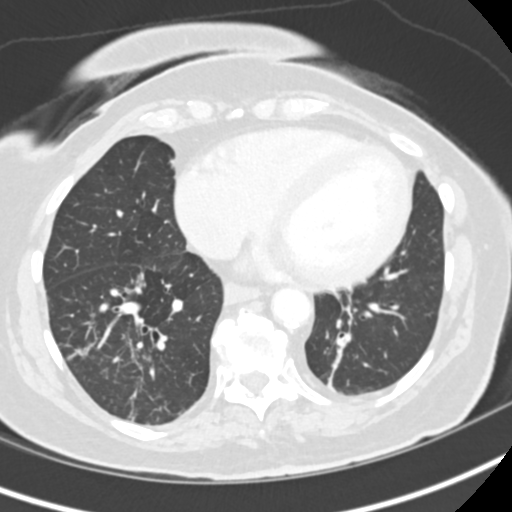
[im 74/161  lung]
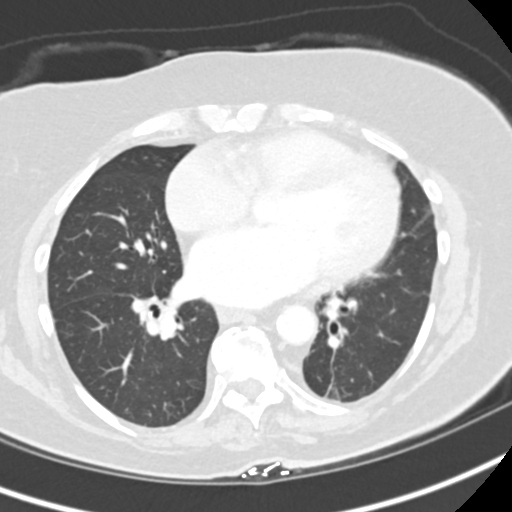
[im 87/161  lung]
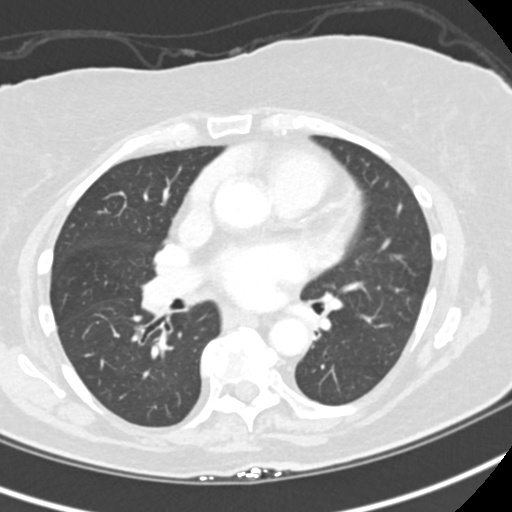
[im 99/161  lung]
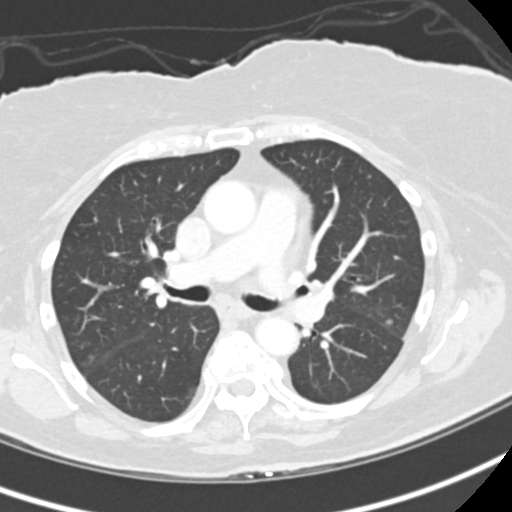
[im 111/161  mediastinal]
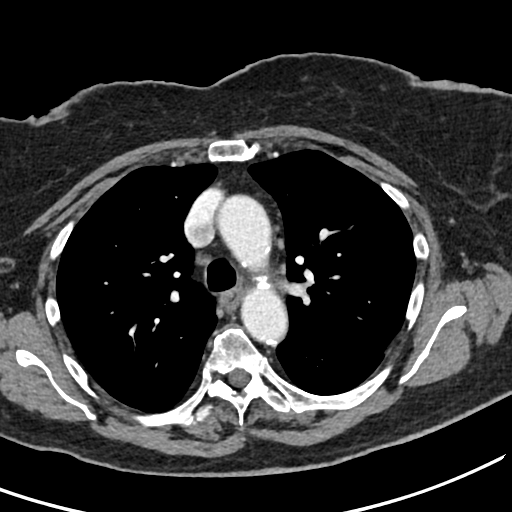
[im 111/161  lung]
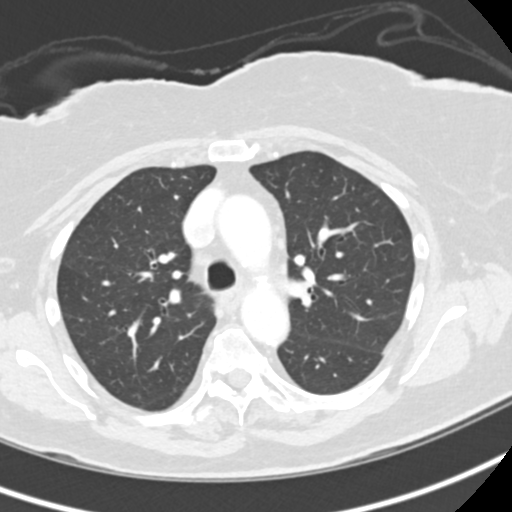
[im 124/161  lung]
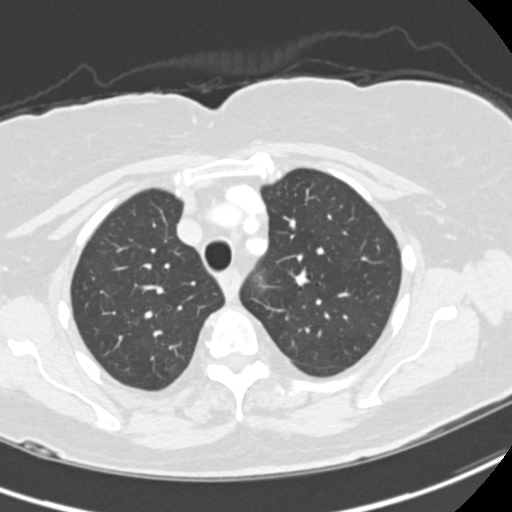
[im 136/161  lung]
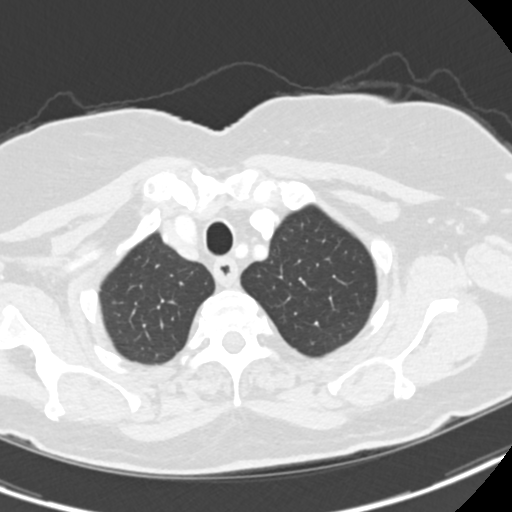
[im 148/161  lung]
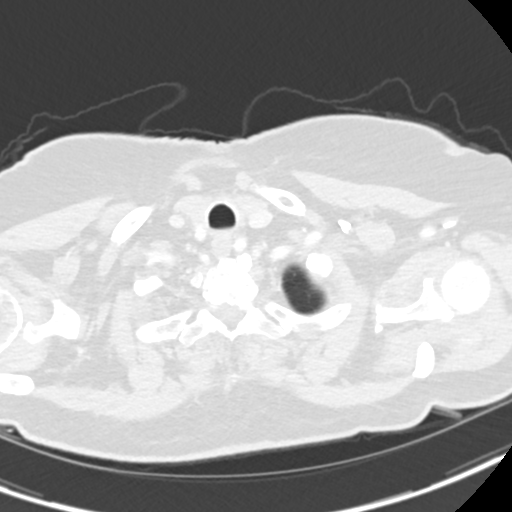

[Series 4: coronal chest 2.00 cor · coronal · 0.58mm/px · 3 of 133 slices shown]
[im 27/133  lung]
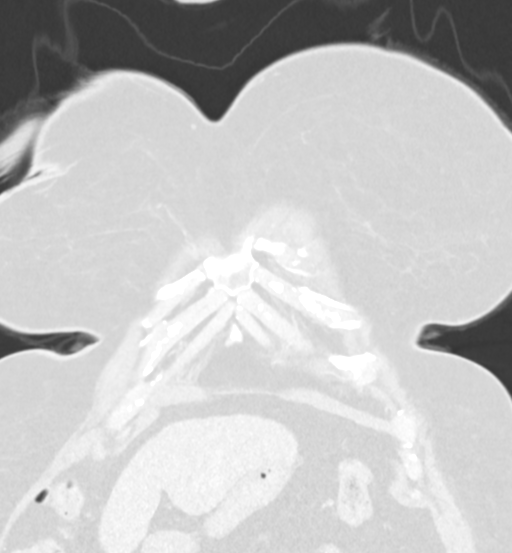
[im 53/133  lung]
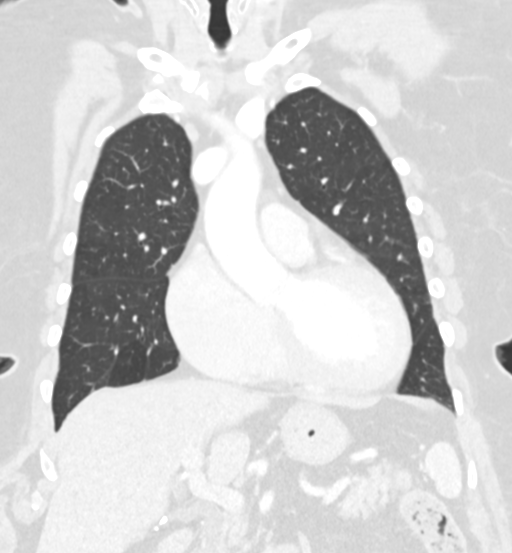
[im 80/133  lung]
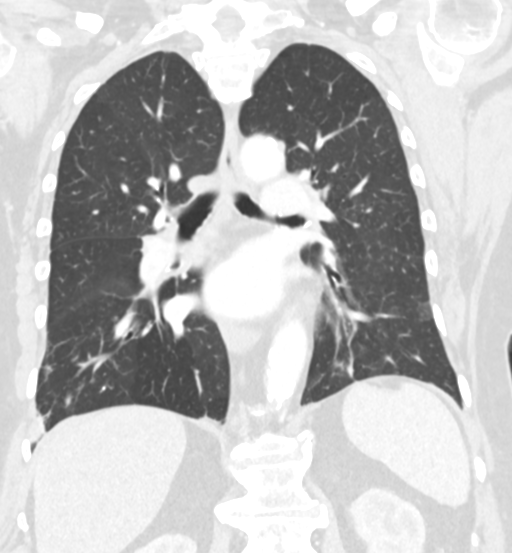

[15 of 36 positions shown; findings below may reference images not displayed]

FINDINGS: Cardiovascular: Atherosclerotic calcifications aorta, proximal great
vessels, and coronary arteries. Aorta normal caliber without
aneurysm. Vascular structures grossly patent on non targeted exam.
Dilatation of cardiac chambers. No pericardial effusion.

Mediastinum/Nodes: Small hiatal hernia. Esophagus stable,
unremarkable. Base of cervical region normal appearance. No thoracic
adenopathy.

Lungs/Pleura: Subsegmental atelectasis in BILATERAL lower lobes.
Patchy infiltrates RIGHT lower lobe, improved since prior chest
radiograph. Remaining lungs clear. No pleural effusion or
pneumothorax.

Upper Abdomen: Visualized upper abdomen unremarkable

Musculoskeletal: No acute osseous findings.
IMPRESSION: Patchy infiltrates RIGHT lower lobe, improved since prior chest
radiograph.

Bibasilar subsegmental atelectasis.

Small hiatal hernia.

Scattered atherosclerotic calcifications including coronary
arteries.

Aortic Atherosclerosis ([id number].V).

## 2023-09-29 ENCOUNTER — Ambulatory Visit (INDEPENDENT_AMBULATORY_CARE_PROVIDER_SITE_OTHER): Payer: Medicare Other | Admitting: Internal Medicine

## 2023-09-29 ENCOUNTER — Encounter: Payer: Self-pay | Admitting: Internal Medicine

## 2023-09-29 VITALS — BP 140/82 | HR 60 | Ht 60.0 in | Wt 139.0 lb

## 2023-09-29 DIAGNOSIS — I5022 Chronic systolic (congestive) heart failure: Secondary | ICD-10-CM

## 2023-09-29 DIAGNOSIS — E782 Mixed hyperlipidemia: Secondary | ICD-10-CM

## 2023-09-29 DIAGNOSIS — I10 Essential (primary) hypertension: Secondary | ICD-10-CM | POA: Diagnosis not present

## 2023-09-29 DIAGNOSIS — R7309 Other abnormal glucose: Secondary | ICD-10-CM

## 2023-09-29 DIAGNOSIS — I2510 Atherosclerotic heart disease of native coronary artery without angina pectoris: Secondary | ICD-10-CM

## 2023-09-29 DIAGNOSIS — R5383 Other fatigue: Secondary | ICD-10-CM

## 2023-09-29 NOTE — Progress Notes (Signed)
Established Patient Office Visit  Subjective:  Patient ID: Andrea Phillips, female    DOB: 03/30/1935  Age: 88 y.o. MRN: [id number]  Chief Complaint  Patient presents with   Follow-up    2 month    Patient comes in for her follow-up today.  States that she is not really feeling well but cannot identify as to what.  Admits to feeling weak and tired but able to ambulate with the help of her cane.  There is no ankle swelling.  However she gets short of breath after exertion.  She was recently seen by her cardiologist and considered stable.  Denies chest pain, no palpitations, no headache.  No recent chest cough or congestion.  And no fevers or chills.  Patient is due for blood work today.  Was not able to give a urine specimen.    No other concerns at this time.   Past Medical History:  Diagnosis Date   Arthritis    ra   Asthma    CHF (congestive heart failure) (HCC)    Dysrhythmia    Edema extremities    GERD (gastroesophageal reflux disease)    Gout    Headache    History of hiatal hernia    Hypertension    Hypothyroidism    Shortness of breath dyspnea    Sleep apnea     Past Surgical History:  Procedure Laterality Date   APPENDECTOMY     BACK SURGERY     CARDIAC CATHETERIZATION     CATARACT EXTRACTION W/PHACO Left 03/09/2015   Procedure: CATARACT EXTRACTION PHACO AND INTRAOCULAR LENS PLACEMENT (IOC);  Surgeon: Sallee Lange, MD;  Location: [facility];  Service: Ophthalmology;  Laterality: Left;  [place] 01:31 AP% 26.1 CDE 43.72   CATARACT EXTRACTION W/PHACO Right 08/19/2021   Procedure: CATARACT EXTRACTION PHACO AND INTRAOCULAR LENS PLACEMENT (IOC) RIGHT;  Surgeon: Galen Manila, MD;  Location: [facility];  Service: Ophthalmology;  Laterality: Right;  12.31 1:07.0   CATARACT EXTRACTION W/PHACO Right 09/07/2021   Procedure: REMOVAL OF LENS FRAGMENTS RIGHT;  Surgeon: Galen Manila, MD;  Location: [facility];  Service: Ophthalmology;  Laterality: Right;    CHOLECYSTECTOMY     CORONARY/GRAFT ACUTE MI REVASCULARIZATION N/A 10/24/2021   Procedure: Coronary/Graft Acute MI Revascularization;  Surgeon: Yvonne Kendall, MD;  Location: [facility];  Service: Cardiovascular;  Laterality: N/A;   EYE SURGERY     retina   JOINT REPLACEMENT     left knee   LEFT HEART CATH AND CORONARY ANGIOGRAPHY N/A 04/02/2021   Procedure: LEFT HEART CATH AND CORONARY ANGIOGRAPHY;  Surgeon: Lamar Blinks, MD;  Location: [facility];  Service: Cardiovascular;  Laterality: N/A;   LEFT HEART CATH AND CORONARY ANGIOGRAPHY N/A 10/24/2021   Procedure: LEFT HEART CATH AND CORONARY ANGIOGRAPHY;  Surgeon: Yvonne Kendall, MD;  Location: [facility];  Service: Cardiovascular;  Laterality: N/A;   PACEMAKER LEADLESS INSERTION N/A 07/26/2022   Procedure: PACEMAKER LEADLESS INSERTION;  Surgeon: Marcina Millard, MD;  Location: [facility];  Service: Cardiovascular;  Laterality: N/A;    Social History   Socioeconomic History   Marital status: Widowed    Spouse name: Not on file   Number of children: Not on file   Years of education: Not on file   Highest education level: Not on file  Occupational History   Not on file  Tobacco Use   Smoking status: Never   Smokeless tobacco: Never  Vaping Use  Vaping status: Never Used  Substance and Sexual Activity   Alcohol use: No   Drug use: No   Sexual activity: Not Currently  Other Topics Concern   Not on file  Social History Narrative   Lives with granddaughter, [name], and family.   Social Determinants of Health   Financial Resource Strain: Not on file  Food Insecurity: No Food Insecurity (02/01/2023)   Hunger Vital Sign    Worried About Running Out of Food in the Last Year: Never true    Ran Out of Food in the Last Year: Never true  Transportation Needs: No Transportation Needs (02/01/2023)   PRAPARE - Administrator, Civil Service (Medical): No    Lack of  Transportation (Non-Medical): No  Physical Activity: Not on file  Stress: Not on file  Social Connections: Not on file  Intimate Partner Violence: Not At Risk (02/01/2023)   Humiliation, Afraid, Rape, and Kick questionnaire    Fear of Current or Ex-Partner: No    Emotionally Abused: No    Physically Abused: No    Sexually Abused: No    Family History  Problem Relation Age of Onset   Heart disease Mother    Heart disease Father    Breast cancer Cousin     Allergies  Allergen Reactions   Aspirin Hives   Other Hives    Yellow dye 6 FOOD COLOR YELLOW POWDER    Atorvastatin Other (See Comments)    unknown   Codeine Other (See Comments)    Doesn't know   Conj Estrog-Medroxyprogest Ace Other (See Comments)    PREMPRO 0.3-1.5 MG ORAL TABLET unknown   Prednisone Other (See Comments)    Chest pain   Raloxifene Other (See Comments)    EVISTA 60 MG ORAL TABLET unknown   Ace Inhibitors Cough   Amlodipine Itching   Valsartan Itching    Outpatient Medications Prior to Visit  Medication Sig   albuterol (VENTOLIN HFA) 108 (90 Base) MCG/ACT inhaler Inhale 2 puffs into the lungs every 6 (six) hours as needed for wheezing or shortness of breath.   amiodarone (PACERONE) 200 MG tablet Take 1 tablet (200 mg total) by mouth daily.   apixaban (ELIQUIS) 5 MG TABS tablet Take 1 tablet (5 mg total) by mouth 2 (two) times daily.   cetirizine (ZYRTEC) 10 MG tablet Take 10 mg by mouth daily.   donepezil (ARICEPT) 10 MG tablet Take 1 tablet (10 mg total) by mouth at bedtime.   fluticasone (FLONASE) 50 MCG/ACT nasal spray Place 1 spray into both nostrils 2 (two) times daily as needed for allergies or rhinitis.   furosemide (LASIX) 40 MG tablet Take 0.5 tablets (20 mg total) by mouth daily.   icosapent Ethyl (VASCEPA) 1 g capsule TAKE 2 CAPSULES BY MOUTH TWICE DAILY WITH MEALS   isosorbide mononitrate (IMDUR) 60 MG 24 hr tablet Take 1 tablet (60 mg total) by mouth daily.   levothyroxine  (SYNTHROID) 125 MCG tablet Take 1 tablet (125 mcg total) by mouth daily.   mometasone-formoterol (DULERA) 200-5 MCG/ACT AERO Inhale 2 puffs into the lungs 2 (two) times daily.   omeprazole (PRILOSEC) 40 MG capsule TAKE 1 CAPSULE BY MOUTH ONCE DAILY   potassium chloride (KLOR-CON M) 10 MEQ tablet Take 2 tablets (20 mEq total) by mouth 2 (two) times daily.   pravastatin (PRAVACHOL) 40 MG tablet TAKE 1 TABLET(40 MG) BY MOUTH DAILY AT 6 PM   telmisartan (MICARDIS) 80 MG tablet Take 1 tablet (80  mg total) by mouth daily.   No facility-administered medications prior to visit.    Review of Systems  Constitutional:  Positive for malaise/fatigue. Negative for chills, diaphoresis and fever.  HENT: Negative.  Negative for congestion, hearing loss and sinus pain.   Eyes: Negative.   Respiratory: Negative.  Negative for cough and shortness of breath.   Cardiovascular: Negative.  Negative for chest pain, palpitations and leg swelling.  Gastrointestinal: Negative.  Negative for abdominal pain, constipation, diarrhea, heartburn, nausea and vomiting.  Genitourinary: Negative.  Negative for dysuria and flank pain.  Musculoskeletal: Negative.  Negative for joint pain and myalgias.  Skin: Negative.   Neurological:  Positive for tingling. Negative for dizziness, sensory change and headaches.  Endo/Heme/Allergies: Negative.   Psychiatric/Behavioral: Negative.  Negative for depression and suicidal ideas. The patient is not nervous/anxious.        Objective:   BP (!) 140/82   Pulse 60   Ht 5' (1.524 m)   Wt 139 lb (63 kg)   SpO2 96%   BMI 27.15 kg/m   Vitals:   09/29/23 1442  BP: (!) 140/82  Pulse: 60  Height: 5' (1.524 m)  Weight: 139 lb (63 kg)  SpO2: 96%  BMI (Calculated): 27.15    Physical Exam Vitals and nursing note reviewed.  Constitutional:      Appearance: Normal appearance.  HENT:     Head: Normocephalic and atraumatic.     Nose: Nose normal.     Mouth/Throat:     Mouth:  Mucous membranes are moist.     Pharynx: Oropharynx is clear.  Eyes:     Conjunctiva/sclera: Conjunctivae normal.     Pupils: Pupils are equal, round, and reactive to light.  Cardiovascular:     Rate and Rhythm: Normal rate and regular rhythm.     Pulses: Normal pulses.     Heart sounds: Normal heart sounds. No murmur heard. Pulmonary:     Effort: Pulmonary effort is normal.     Breath sounds: Normal breath sounds. No wheezing.  Abdominal:     General: Bowel sounds are normal.     Palpations: Abdomen is soft.     Tenderness: There is no abdominal tenderness. There is no right CVA tenderness or left CVA tenderness.  Musculoskeletal:        General: Normal range of motion.     Cervical back: Normal range of motion.     Right lower leg: No edema.     Left lower leg: No edema.  Skin:    General: Skin is warm and dry.  Neurological:     General: No focal deficit present.     Mental Status: She is alert and oriented to person, place, and time.  Psychiatric:        Mood and Affect: Mood normal.        Behavior: Behavior normal.      No results found for any visits on 09/29/23.  No results found for this or any previous visit (from the past 2160 hour(s)).    Assessment & Plan:  Check labs today.  Patient advised to rest monitor and monitor her blood pressure at home. Problem List Items Addressed This Visit     Essential hypertension   Chronic HFrEF (heart failure with reduced ejection fraction)  with improved EF(HCC) - Primary   Mixed hyperlipidemia   Relevant Orders   Lipid Panel w/o Chol/HDL Ratio   Coronary artery disease involving native coronary artery of native heart without angina  pectoris   Relevant Orders   CMP14+EGFR   Elevated glucose level   Relevant Orders   Hemoglobin A1c   Other Visit Diagnoses     Other fatigue       Relevant Orders   CBC with Diff   TSH       Return in about 3 months (around 12/28/2023).   Total time spent: 30  minutes  Margaretann Loveless, MD  09/29/2023   This document may have been prepared by [facility] Voice Recognition software and as such may include unintentional dictation errors.

## 2023-09-30 LAB — CBC WITH DIFFERENTIAL/PLATELET
Basophils Absolute: 0.1 10*3/uL (ref 0.0–0.2)
Basos: 1 %
EOS (ABSOLUTE): 0.4 10*3/uL (ref 0.0–0.4)
Eos: 6 %
Hematocrit: 47.2 % — ABNORMAL HIGH (ref 34.0–46.6)
Hemoglobin: 15.4 g/dL (ref 11.1–15.9)
Immature Grans (Abs): 0 10*3/uL (ref 0.0–0.1)
Immature Granulocytes: 0 %
Lymphocytes Absolute: 1.8 10*3/uL (ref 0.7–3.1)
Lymphs: 27 %
MCH: 30.1 pg (ref 26.6–33.0)
MCHC: 32.6 g/dL (ref 31.5–35.7)
MCV: 92 fL (ref 79–97)
Monocytes Absolute: 0.7 10*3/uL (ref 0.1–0.9)
Monocytes: 11 %
Neutrophils Absolute: 3.7 10*3/uL (ref 1.4–7.0)
Neutrophils: 55 %
Platelets: 313 10*3/uL (ref 150–450)
RBC: 5.11 x10E6/uL (ref 3.77–5.28)
RDW: 14.2 % (ref 11.7–15.4)
WBC: 6.6 10*3/uL (ref 3.4–10.8)

## 2023-09-30 LAB — CMP14+EGFR
ALT: 24 [IU]/L (ref 0–32)
AST: 35 [IU]/L (ref 0–40)
Albumin: 3.8 g/dL (ref 3.7–4.7)
Alkaline Phosphatase: 69 [IU]/L (ref 44–121)
BUN/Creatinine Ratio: 13 (ref 12–28)
BUN: 19 mg/dL (ref 8–27)
Bilirubin Total: 1.3 mg/dL — ABNORMAL HIGH (ref 0.0–1.2)
CO2: 23 mmol/L (ref 20–29)
Calcium: 10.6 mg/dL — ABNORMAL HIGH (ref 8.7–10.3)
Chloride: 104 mmol/L (ref 96–106)
Creatinine, Ser: 1.44 mg/dL — ABNORMAL HIGH (ref 0.57–1.00)
Globulin, Total: 2.9 g/dL (ref 1.5–4.5)
Glucose: 84 mg/dL (ref 70–99)
Potassium: 5.1 mmol/L (ref 3.5–5.2)
Sodium: 145 mmol/L — ABNORMAL HIGH (ref 134–144)
Total Protein: 6.7 g/dL (ref 6.0–8.5)
eGFR: 35 mL/min/{1.73_m2} — ABNORMAL LOW (ref >59)

## 2023-09-30 LAB — LIPID PANEL W/O CHOL/HDL RATIO
Cholesterol, Total: 179 mg/dL (ref 100–199)
HDL: 45 mg/dL (ref >39)
LDL Chol Calc (NIH): 117 mg/dL — ABNORMAL HIGH (ref 0–99)
Triglycerides: 90 mg/dL (ref 0–149)
VLDL Cholesterol Cal: 17 mg/dL (ref 5–40)

## 2023-09-30 LAB — HEMOGLOBIN A1C
Est. average glucose Bld gHb Est-mCnc: 103 mg/dL
Hgb A1c MFr Bld: 5.2 % (ref 4.8–5.6)

## 2023-09-30 LAB — TSH: TSH: 6.23 u[IU]/mL — ABNORMAL HIGH (ref 0.450–4.500)

## 2023-10-03 ENCOUNTER — Other Ambulatory Visit: Payer: Self-pay | Admitting: Internal Medicine

## 2023-10-03 DIAGNOSIS — E038 Other specified hypothyroidism: Secondary | ICD-10-CM

## 2023-10-03 MED ORDER — LEVOTHYROXINE SODIUM 137 MCG PO TABS: 137.0000 ug | ORAL_TABLET | Freq: Every day | ORAL | 6 refills | Status: DC

## 2023-11-24 ENCOUNTER — Other Ambulatory Visit: Payer: Self-pay | Admitting: Family

## 2023-12-29 ENCOUNTER — Emergency Department
Admission: EM | Admit: 2023-12-29 | Discharge: 2023-12-29 | Disposition: A | Discharge status: Home / Self Care | Attending: Emergency Medicine | Admitting: Emergency Medicine

## 2023-12-29 ENCOUNTER — Other Ambulatory Visit: Payer: Self-pay

## 2023-12-29 ENCOUNTER — Encounter: Payer: Self-pay | Admitting: Internal Medicine

## 2023-12-29 ENCOUNTER — Encounter: Payer: Self-pay | Admitting: Emergency Medicine

## 2023-12-29 ENCOUNTER — Ambulatory Visit: Payer: Medicare Other | Admitting: Internal Medicine

## 2023-12-29 ENCOUNTER — Emergency Department

## 2023-12-29 VITALS — BP 90/50 | HR 101 | Ht 60.0 in | Wt 140.0 lb

## 2023-12-29 DIAGNOSIS — E782 Mixed hyperlipidemia: Secondary | ICD-10-CM

## 2023-12-29 DIAGNOSIS — I5022 Chronic systolic (congestive) heart failure: Secondary | ICD-10-CM

## 2023-12-29 DIAGNOSIS — I10 Essential (primary) hypertension: Secondary | ICD-10-CM

## 2023-12-29 DIAGNOSIS — I5020 Unspecified systolic (congestive) heart failure: Secondary | ICD-10-CM | POA: Diagnosis not present

## 2023-12-29 DIAGNOSIS — R002 Palpitations: Secondary | ICD-10-CM | POA: Diagnosis not present

## 2023-12-29 DIAGNOSIS — I2510 Atherosclerotic heart disease of native coronary artery without angina pectoris: Secondary | ICD-10-CM

## 2023-12-29 DIAGNOSIS — R000 Tachycardia, unspecified: Secondary | ICD-10-CM | POA: Diagnosis present

## 2023-12-29 DIAGNOSIS — I4891 Unspecified atrial fibrillation: Secondary | ICD-10-CM | POA: Diagnosis not present

## 2023-12-29 DIAGNOSIS — E038 Other specified hypothyroidism: Secondary | ICD-10-CM

## 2023-12-29 DIAGNOSIS — I482 Chronic atrial fibrillation, unspecified: Secondary | ICD-10-CM

## 2023-12-29 LAB — TROPONIN I (HIGH SENSITIVITY)
Troponin I (High Sensitivity): 24 ng/L — ABNORMAL HIGH (ref <18)
Troponin I (High Sensitivity): 31 ng/L — ABNORMAL HIGH (ref <18)

## 2023-12-29 LAB — CBC
HCT: 50.7 % — ABNORMAL HIGH (ref 36.0–46.0)
Hemoglobin: 17.1 g/dL — ABNORMAL HIGH (ref 12.0–15.0)
MCH: 31.6 pg (ref 26.0–34.0)
MCHC: 33.7 g/dL (ref 30.0–36.0)
MCV: 93.7 fL (ref 80.0–100.0)
Platelets: 218 10*3/uL (ref 150–400)
RBC: 5.41 MIL/uL — ABNORMAL HIGH (ref 3.87–5.11)
RDW: 15.4 % (ref 11.5–15.5)
WBC: 6.8 10*3/uL (ref 4.0–10.5)
nRBC: 0 % (ref 0.0–0.2)

## 2023-12-29 LAB — BASIC METABOLIC PANEL
Anion gap: 11 (ref 5–15)
BUN: 13 mg/dL (ref 8–23)
CO2: 28 mmol/L (ref 22–32)
Calcium: 10.4 mg/dL — ABNORMAL HIGH (ref 8.9–10.3)
Chloride: 102 mmol/L (ref 98–111)
Creatinine, Ser: 1.18 mg/dL — ABNORMAL HIGH (ref 0.44–1.00)
GFR, Estimated: 45 mL/min — ABNORMAL LOW (ref >60)
Glucose, Bld: 102 mg/dL — ABNORMAL HIGH (ref 70–99)
Potassium: 3.9 mmol/L (ref 3.5–5.1)
Sodium: 141 mmol/L (ref 135–145)

## 2023-12-29 NOTE — ED Notes (Signed)
 Vitals rechecked and acuity changed to 2, due to HR being in 130-140s. Charge RN informed

## 2023-12-29 NOTE — Discharge Instructions (Signed)
 Please ensure that you are taking your blood pressure medications as prescribed that will also help control your heart rate.  This is the amiodarone 200 mg every day and the metoprolol 50 mg every day.  Please follow-up with your cardiology team in the next week for ongoing management of these medications.

## 2023-12-29 NOTE — ED Triage Notes (Signed)
 Patient to ED via POV for hypotension and tachycardia. EKG showing SVT at rate of 138 and BP of 90/50 at doctors office. Pt states that they have recently been adjusting her heart meds. Denies CP.

## 2023-12-29 NOTE — ED Provider Notes (Signed)
 [facility] Provider Note    Event Date/Time   First MD Initiated Contact with Patient 12/29/23 1845     (approximate)   History   Hypotension   HPI Andrea Phillips is a 89 y.o. female with history of A-fib with RVR, HFrEF, CAD, HLD presenting today for tachycardia.  Patient was supposedly at her primary care provider's office earlier today for routine checkup when she was found to have A-fib with RVR and hypotension.  She admits to medication noncompliance recently and unsure what she supposed to be on.  She otherwise denies chest pain, shortness of breath, nausea, vomiting, cough, congestion, abdominal pain.  Feels like she has been eating and drinking okay.  No obvious weakness.  She is completely asymptomatic at this time.     Physical Exam   Triage Vital Signs: ED Triage Vitals  Encounter Vitals Group     BP 12/29/23 1523 (!) 149/95     Systolic BP Percentile --      Diastolic BP Percentile --      Pulse Rate 12/29/23 1523 (!) 143     Resp 12/29/23 1523 18     Temp 12/29/23 1523 97.9 F (36.6 C)     Temp Source 12/29/23 1523 Oral     SpO2 12/29/23 1523 96 %     Weight 12/29/23 1524 139 lb (63 kg)     Height 12/29/23 1524 5' (1.524 m)     Head Circumference --      Peak Flow --      Pain Score 12/29/23 1524 0     Pain Loc --      Pain Education --      Exclude from Growth Chart --     Most recent vital signs: Vitals:   12/29/23 1850 12/29/23 1922  BP: (!) 149/94 101/78  Pulse: 87 90  Resp: 18 18  Temp:  (!) 97.5 F (36.4 C)  SpO2: 98% 95%   Physical Exam: I have reviewed the vital signs and nursing notes. General: Awake, alert, no acute distress.  Nontoxic appearing. Head:  Atraumatic, normocephalic.   ENT:  EOM intact, PERRL. Oral mucosa is pink and moist with no lesions. Neck: Neck is supple with full range of motion, No meningeal signs. Cardiovascular:  RRR, No murmurs. Peripheral pulses palpable and equal  bilaterally. Respiratory:  Symmetrical chest wall expansion.  No rhonchi, rales, or wheezes.  Good air movement throughout.  No use of accessory muscles.   Musculoskeletal:  No cyanosis or edema. Moving extremities with full ROM Abdomen:  Soft, nontender, nondistended. Neuro:  GCS 15, moving all four extremities, interacting appropriately. Speech clear. Psych:  Calm, appropriate.   Skin:  Warm, dry, no rash.    ED Results / Procedures / Treatments   Labs (all labs ordered are listed, but only abnormal results are displayed) Labs Reviewed  BASIC METABOLIC PANEL - Abnormal; Notable for the following components:      Result Value   Glucose, Bld 102 (*)    Creatinine, Ser 1.18 (*)    Calcium 10.4 (*)    GFR, Estimated 45 (*)    All other components within normal limits  CBC - Abnormal; Notable for the following components:   RBC 5.41 (*)    Hemoglobin 17.1 (*)    HCT 50.7 (*)    All other components within normal limits  TROPONIN I (HIGH SENSITIVITY) - Abnormal; Notable for the following components:   Troponin I (High Sensitivity)  24 (*)    All other components within normal limits  TROPONIN I (HIGH SENSITIVITY) - Abnormal; Notable for the following components:   Troponin I (High Sensitivity) 31 (*)    All other components within normal limits     EKG My EKG interpretation: Rate of 139, A-fib with RVR.  No acute ST elevations or depressions.  Normal intervals.   RADIOLOGY Independently interpreted chest x-ray with no acute pathology   PROCEDURES:  Critical Care performed: No  Procedures   MEDICATIONS ORDERED IN ED: Medications - No data to display   IMPRESSION / MDM / ASSESSMENT AND PLAN / ED COURSE  I reviewed the triage vital signs and the nursing notes.                              Differential diagnosis includes, but is not limited to, medication noncompliance, A-fib with RVR, electrolyte abnormality, dehydration  Patient's presentation is most consistent  with acute complicated illness / injury requiring diagnostic workup.  Patient is an 89 year old female presenting today for concerns of A-fib with RVR with hypotension.  On arrival she was in A-fib with RVR but no significant hypotension.  She denies any acute symptoms and feels well at this time otherwise.  EKG shows A-fib RVR but no other acute findings.  Laboratory workup with normal CBC and BMP.  By the time patient got back to a bed, her heart rate had normalized and her blood pressure was stable.  Continues to deny any acute symptoms.  Troponin stable x 2 and consistent with her baseline.  Continued to be asymptomatic with stable vital signs.  Will discharge and instructed her on proper medication doses based on her recent notes from her PCP and cardiologist.  Told to follow-up with cardiology for ongoing medication management.  She is agreeable with this plan.  Given strict return precautions.  The patient is on the cardiac monitor to evaluate for evidence of arrhythmia and/or significant heart rate changes. Clinical Course as of 12/29/23 2005  Fri Dec 29, 2023  2004 Reassessed.  Still asymptomatic.  Vital signs continue to be stable.  Will discharge [DW]    Clinical Course User Index [DW] Janith Lima, MD     FINAL CLINICAL IMPRESSION(S) / ED DIAGNOSES   Final diagnoses:  Atrial fibrillation with RVR (HCC)     Rx / DC Orders   ED Discharge Orders     None        Note:  This document was prepared using Dragon voice recognition software and may include unintentional dictation errors.   Janith Lima, MD 12/29/23 2006

## 2023-12-29 NOTE — Progress Notes (Signed)
 Established Patient Office Visit  Subjective:  Patient ID: Andrea Phillips, female    DOB: June 04, 1935  Age: 89 y.o. MRN: [id number]  Chief Complaint  Patient presents with   Follow-up    3 month follow up    Patient is here for her follow-up today.  Complains of feeling very weak and tired although there is no complaint of chest pain or shortness of breath.  Her initial blood pressure is 90/50, but she has not taken any of her medications yet.  Her pulse is very fast and the EKG shows rapid A-fib at a rate of 138 with possible ischemic changes.  Patient admits to had not taking her medications for more than 2 days which includes amiodarone and metoprolol.  Tried to contact her cardiologist office.  However decided to send the patient to the ED with her relative.  She may need admission and medication adjustment.    No other concerns at this time.   Past Medical History:  Diagnosis Date   Arthritis    ra   Asthma    CHF (congestive heart failure) (HCC)    Dysrhythmia    Edema extremities    GERD (gastroesophageal reflux disease)    Gout    Headache    History of hiatal hernia    Hypertension    Hypothyroidism    Shortness of breath dyspnea    Sleep apnea     Past Surgical History:  Procedure Laterality Date   APPENDECTOMY     BACK SURGERY     CARDIAC CATHETERIZATION     CATARACT EXTRACTION W/PHACO Left 03/09/2015   Procedure: CATARACT EXTRACTION PHACO AND INTRAOCULAR LENS PLACEMENT (IOC);  Surgeon: Sallee Lange, MD;  Location: [facility];  Service: Ophthalmology;  Laterality: Left;  [place] 01:31 AP% 26.1 CDE 43.72   CATARACT EXTRACTION W/PHACO Right 08/19/2021   Procedure: CATARACT EXTRACTION PHACO AND INTRAOCULAR LENS PLACEMENT (IOC) RIGHT;  Surgeon: Galen Manila, MD;  Location: [facility];  Service: Ophthalmology;  Laterality: Right;  12.31 1:07.0   CATARACT EXTRACTION W/PHACO Right 09/07/2021   Procedure: REMOVAL OF LENS FRAGMENTS RIGHT;  Surgeon: Galen Manila, MD;  Location: [facility];  Service: Ophthalmology;  Laterality: Right;   CHOLECYSTECTOMY     CORONARY/GRAFT ACUTE MI REVASCULARIZATION N/A 10/24/2021   Procedure: Coronary/Graft Acute MI Revascularization;  Surgeon: Yvonne Kendall, MD;  Location: [facility];  Service: Cardiovascular;  Laterality: N/A;   EYE SURGERY     retina   JOINT REPLACEMENT     left knee   LEFT HEART CATH AND CORONARY ANGIOGRAPHY N/A 04/02/2021   Procedure: LEFT HEART CATH AND CORONARY ANGIOGRAPHY;  Surgeon: Lamar Blinks, MD;  Location: [facility];  Service: Cardiovascular;  Laterality: N/A;   LEFT HEART CATH AND CORONARY ANGIOGRAPHY N/A 10/24/2021   Procedure: LEFT HEART CATH AND CORONARY ANGIOGRAPHY;  Surgeon: Yvonne Kendall, MD;  Location: [facility];  Service: Cardiovascular;  Laterality: N/A;   PACEMAKER LEADLESS INSERTION N/A 07/26/2022   Procedure: PACEMAKER LEADLESS INSERTION;  Surgeon: Marcina Millard, MD;  Location: [facility];  Service: Cardiovascular;  Laterality: N/A;    Social History   Socioeconomic History   Marital status: Widowed    Spouse name: Not on file   Number of children: Not on file   Years of education: Not on file   Highest education level: Not on file  Occupational History   Not on file  Tobacco Use   Smoking status:  Never   Smokeless tobacco: Never  Vaping Use   Vaping status: Never Used  Substance and Sexual Activity   Alcohol use: No   Drug use: No   Sexual activity: Not Currently  Other Topics Concern   Not on file  Social History Narrative   Lives with granddaughter, Andrea Phillips, and family.   Social Drivers of Corporate investment banker Strain: Not on file  Food Insecurity: No Food Insecurity (02/01/2023)   Hunger Vital Sign    Worried About Running Out of Food in the Last Year: Never true    Ran Out of Food in the Last Year: Never true  Transportation Needs: No Transportation Needs (02/01/2023)    PRAPARE - Administrator, Civil Service (Medical): No    Lack of Transportation (Non-Medical): No  Physical Activity: Not on file  Stress: Not on file  Social Connections: Not on file  Intimate Partner Violence: Not At Risk (02/01/2023)   Humiliation, Afraid, Rape, and Kick questionnaire    Fear of Current or Ex-Partner: No    Emotionally Abused: No    Physically Abused: No    Sexually Abused: No    Family History  Problem Relation Age of Onset   Heart disease Mother    Heart disease Father    Breast cancer Cousin     Allergies  Allergen Reactions   Aspirin Hives   Other Hives    Yellow dye 6 FOOD COLOR YELLOW POWDER    Atorvastatin Other (See Comments)    unknown   Codeine Other (See Comments)    Doesn't know   Conj Estrog-Medroxyprogest Ace Other (See Comments)    PREMPRO 0.3-1.5 MG ORAL TABLET unknown   Prednisone Other (See Comments)    Chest pain   Raloxifene Other (See Comments)    EVISTA 60 MG ORAL TABLET unknown   Ace Inhibitors Cough   Amlodipine Itching   Valsartan Itching    Outpatient Medications Prior to Visit  Medication Sig   albuterol (VENTOLIN HFA) 108 (90 Base) MCG/ACT inhaler Inhale 2 puffs into the lungs every 6 (six) hours as needed for wheezing or shortness of breath.   amiodarone (PACERONE) 200 MG tablet Take 1 tablet (200 mg total) by mouth daily.   apixaban (ELIQUIS) 5 MG TABS tablet Take 1 tablet (5 mg total) by mouth 2 (two) times daily.   cetirizine (ZYRTEC) 10 MG tablet Take 10 mg by mouth daily.   donepezil (ARICEPT) 10 MG tablet Take 1 tablet (10 mg total) by mouth at bedtime.   fluticasone (FLONASE) 50 MCG/ACT nasal spray Place 1 spray into both nostrils 2 (two) times daily as needed for allergies or rhinitis.   furosemide (LASIX) 40 MG tablet Take 0.5 tablets (20 mg total) by mouth daily.   icosapent Ethyl (VASCEPA) 1 g capsule TAKE 2 CAPSULES BY MOUTH TWICE DAILY WITH MEALS   isosorbide mononitrate (IMDUR) 60 MG 24  hr tablet Take 1 tablet (60 mg total) by mouth daily.   levothyroxine (LEVOXYL) 137 MCG tablet Take 1 tablet (137 mcg total) by mouth daily before breakfast.   mometasone-formoterol (DULERA) 200-5 MCG/ACT AERO Inhale 2 puffs into the lungs 2 (two) times daily.   omeprazole (PRILOSEC) 40 MG capsule TAKE 1 CAPSULE BY MOUTH DAILY   potassium chloride (KLOR-CON M) 10 MEQ tablet Take 2 tablets (20 mEq total) by mouth 2 (two) times daily.   pravastatin (PRAVACHOL) 40 MG tablet TAKE 1 TABLET(40 MG) BY MOUTH DAILY AT 6  PM   telmisartan (MICARDIS) 80 MG tablet Take 1 tablet (80 mg total) by mouth daily.   No facility-administered medications prior to visit.    Review of Systems  Constitutional:  Positive for malaise/fatigue. Negative for chills, fever and weight loss.  HENT:  Positive for hearing loss.   Eyes: Negative.   Respiratory: Negative.  Negative for cough and shortness of breath.   Cardiovascular:  Positive for palpitations. Negative for chest pain and leg swelling.  Gastrointestinal: Negative.  Negative for abdominal pain, constipation, diarrhea, heartburn, nausea and vomiting.  Genitourinary: Negative.  Negative for dysuria and flank pain.  Musculoskeletal: Negative.  Negative for joint pain and myalgias.  Skin: Negative.   Neurological:  Positive for dizziness. Negative for tingling and headaches.  Endo/Heme/Allergies: Negative.   Psychiatric/Behavioral: Negative.  Negative for depression and suicidal ideas. The patient is not nervous/anxious.        Objective:   BP (!) 90/50   Pulse (!) 101   Ht 5' (1.524 m)   Wt 140 lb (63.5 kg)   SpO2 96%   BMI 27.34 kg/m   Vitals:   12/29/23 1402  BP: (!) 90/50  Pulse: (!) 101  Height: 5' (1.524 m)  Weight: 140 lb (63.5 kg)  SpO2: 96%  BMI (Calculated): 27.34    Physical Exam Vitals and nursing note reviewed.  Constitutional:      General: She is not in acute distress.    Appearance: Normal appearance.  HENT:     Head:  Normocephalic and atraumatic.     Nose: Nose normal.     Mouth/Throat:     Mouth: Mucous membranes are moist.     Pharynx: Oropharynx is clear.  Eyes:     Conjunctiva/sclera: Conjunctivae normal.     Pupils: Pupils are equal, round, and reactive to light.  Cardiovascular:     Rate and Rhythm: Normal rate. Rhythm irregular.     Heart sounds: No murmur heard. Pulmonary:     Effort: Pulmonary effort is normal.     Breath sounds: Normal breath sounds. No wheezing.  Abdominal:     General: Bowel sounds are normal.     Palpations: Abdomen is soft.     Tenderness: There is no abdominal tenderness. There is no right CVA tenderness or left CVA tenderness.  Musculoskeletal:        General: Normal range of motion.     Cervical back: Normal range of motion.     Right lower leg: No edema.     Left lower leg: No edema.  Skin:    General: Skin is warm and dry.  Neurological:     General: No focal deficit present.     Mental Status: She is alert and oriented to person, place, and time.  Psychiatric:        Mood and Affect: Mood normal.        Behavior: Behavior normal.      Results for orders placed or performed during the hospital encounter of 12/29/23  Basic metabolic panel  Result Value Ref Range   Sodium 141 135 - 145 mmol/L   Potassium 3.9 3.5 - 5.1 mmol/L   Chloride 102 98 - 111 mmol/L   CO2 28 22 - 32 mmol/L   Glucose, Bld 102 (H) 70 - 99 mg/dL   BUN 13 8 - 23 mg/dL   Creatinine, Ser 6.21 (H) 0.44 - 1.00 mg/dL   Calcium 30.8 (H) 8.9 - 10.3 mg/dL   GFR, Estimated 45 (  L) >60 mL/min   Anion gap 11 5 - 15  CBC  Result Value Ref Range   WBC 6.8 4.0 - 10.5 K/uL   RBC 5.41 (H) 3.87 - 5.11 MIL/uL   Hemoglobin 17.1 (H) 12.0 - 15.0 g/dL   HCT 82.9 (H) 56.2 - 13.0 %   MCV 93.7 80.0 - 100.0 fL   MCH 31.6 26.0 - 34.0 pg   MCHC 33.7 30.0 - 36.0 g/dL   RDW 86.5 78.4 - 69.6 %   Platelets 218 150 - 400 K/uL   nRBC 0.0 0.0 - 0.2 %  Troponin I (High Sensitivity)  Result Value Ref  Range   Troponin I (High Sensitivity) 24 (H) <18 ng/L    Recent Results (from the past 2160 hours)  Basic metabolic panel     Status: Abnormal   Collection Time: 12/29/23  3:27 PM  Result Value Ref Range   Sodium 141 135 - 145 mmol/L   Potassium 3.9 3.5 - 5.1 mmol/L   Chloride 102 98 - 111 mmol/L   CO2 28 22 - 32 mmol/L   Glucose, Bld 102 (H) 70 - 99 mg/dL    Comment: Glucose reference range applies only to samples taken after fasting for at least 8 hours.   BUN 13 8 - 23 mg/dL   Creatinine, Ser 2.95 (H) 0.44 - 1.00 mg/dL   Calcium 28.4 (H) 8.9 - 10.3 mg/dL   GFR, Estimated 45 (L) >60 mL/min    Comment: (NOTE) Calculated using the CKD-EPI Creatinine Equation (2021)    Anion gap 11 5 - 15    Comment: Performed at [facility], [address] [place], [place] [address]  CBC     Status: Abnormal   Collection Time: 12/29/23  3:27 PM  Result Value Ref Range   WBC 6.8 4.0 - 10.5 K/uL   RBC 5.41 (H) 3.87 - 5.11 MIL/uL   Hemoglobin 17.1 (H) 12.0 - 15.0 g/dL   HCT 01.0 (H) 27.2 - 53.6 %   MCV 93.7 80.0 - 100.0 fL   MCH 31.6 26.0 - 34.0 pg   MCHC 33.7 30.0 - 36.0 g/dL   RDW 64.4 03.4 - 74.2 %   Platelets 218 150 - 400 K/uL   nRBC 0.0 0.0 - 0.2 %    Comment: Performed at [facility], [address] [place], [place] [address]  Troponin I (High Sensitivity)     Status: Abnormal   Collection Time: 12/29/23  3:27 PM  Result Value Ref Range   Troponin I (High Sensitivity) 24 (H) <18 ng/L    Comment: (NOTE) Elevated high sensitivity troponin I (hsTnI) values and significant  changes across serial measurements may suggest ACS but many other  chronic and acute conditions are known to elevate hsTnI results.  Refer to the "Links" section for chest pain algorithms and additional  guidance. Performed at [facility], [address], [place], [place] [address]       Assessment & Plan:  Patient sent to ED. Problem List Items Addressed This Visit      Essential hypertension   Chronic HFrEF (heart failure with reduced ejection fraction)  with improved EF(HCC) - Primary   Hypothyroidism   Mixed hyperlipidemia   Coronary artery disease involving native coronary artery of native heart without angina pectoris   Chronic atrial fibrillation (HCC)   Other Visit Diagnoses       Palpitations       Relevant Orders   EKG 12-Lead  Follow up in 2 weeks.  Total time spent: 30 minutes  Margaretann Loveless, MD  12/29/2023   This document may have been prepared by [facility] Voice Recognition software and as such may include unintentional dictation errors.

## 2024-07-19 ENCOUNTER — Ambulatory Visit: Admitting: Cardiology

## 2024-07-26 ENCOUNTER — Ambulatory Visit: Admitting: Internal Medicine

## 2024-07-26 ENCOUNTER — Encounter: Payer: Self-pay | Admitting: Internal Medicine

## 2024-07-26 VITALS — BP 122/72 | HR 60 | Ht 60.0 in | Wt 136.0 lb

## 2024-07-26 DIAGNOSIS — Z23 Encounter for immunization: Secondary | ICD-10-CM

## 2024-07-26 DIAGNOSIS — I10 Essential (primary) hypertension: Secondary | ICD-10-CM

## 2024-07-26 DIAGNOSIS — D229 Melanocytic nevi, unspecified: Secondary | ICD-10-CM

## 2024-07-26 DIAGNOSIS — R5383 Other fatigue: Secondary | ICD-10-CM

## 2024-07-26 DIAGNOSIS — I482 Chronic atrial fibrillation, unspecified: Secondary | ICD-10-CM

## 2024-07-26 DIAGNOSIS — I2510 Atherosclerotic heart disease of native coronary artery without angina pectoris: Secondary | ICD-10-CM

## 2024-07-26 DIAGNOSIS — E782 Mixed hyperlipidemia: Secondary | ICD-10-CM | POA: Diagnosis not present

## 2024-07-26 DIAGNOSIS — I5022 Chronic systolic (congestive) heart failure: Secondary | ICD-10-CM

## 2024-07-26 NOTE — Progress Notes (Signed)
 Established Patient Office Visit  Subjective:  Patient ID: Andrea Phillips, female    DOB: [birth year]  Age: 89 y.o. MRN: [id number]  Chief Complaint  Patient presents with   Follow-up    Patient is here today for follow up. She reports she is feeling ok today. States she is tired and weak. Patient recently saw cardiologist and pacemaker is functionally as expected. Denies headache, chest pain, shortness of breath, palpitations. She has a mole on her right cheek that has been enlarging. Her glasses sit on top of the mole and cause irritation. Patient has seen dermatologist in the past. Will send referral to dermatologist to have mole examined further and removed as needed.  Patient is proactive and wearing her life alert button. She would like seasonal flu shot today. Administered during her visit.  Patient is due for lab work.    No other concerns at this time.   Past Medical History:  Diagnosis Date   Arthritis    ra   Asthma    CHF (congestive heart failure) (HCC)    Dysrhythmia    Edema extremities    GERD (gastroesophageal reflux disease)    Gout    Headache    History of hiatal hernia    Hypertension    Hypothyroidism    Shortness of breath dyspnea    Sleep apnea     Past Surgical History:  Procedure Laterality Date   APPENDECTOMY     BACK SURGERY     CARDIAC CATHETERIZATION     CATARACT EXTRACTION W/PHACO Left 03/09/2015   Procedure: CATARACT EXTRACTION PHACO AND INTRAOCULAR LENS PLACEMENT (IOC);  Surgeon: Steven Dingeldein, MD;  Location: [facility];  Service: Ophthalmology;  Laterality: Left;  [place]  01:31 AP% 26.1 CDE 43.72   CATARACT EXTRACTION W/PHACO Right 08/19/2021   Procedure: CATARACT EXTRACTION PHACO AND INTRAOCULAR LENS PLACEMENT (IOC) RIGHT;  Surgeon: Jaye Fallow, MD;  Location: [facility];  Service: Ophthalmology;  Laterality: Right;  12.31 1:07.0   CATARACT EXTRACTION W/PHACO Right 09/07/2021   Procedure: REMOVAL OF LENS FRAGMENTS RIGHT;   Surgeon: Jaye Fallow, MD;  Location: [facility];  Service: Ophthalmology;  Laterality: Right;   CHOLECYSTECTOMY     CORONARY/GRAFT ACUTE MI REVASCULARIZATION N/A 10/24/2021   Procedure: Coronary/Graft Acute MI Revascularization;  Surgeon: Mady Bruckner, MD;  Location: [facility];  Service: Cardiovascular;  Laterality: N/A;   EYE SURGERY     retina   JOINT REPLACEMENT     left knee   LEFT HEART CATH AND CORONARY ANGIOGRAPHY N/A 04/02/2021   Procedure: LEFT HEART CATH AND CORONARY ANGIOGRAPHY;  Surgeon: Hester Wolm PARAS, MD;  Location: [facility];  Service: Cardiovascular;  Laterality: N/A;   LEFT HEART CATH AND CORONARY ANGIOGRAPHY N/A 10/24/2021   Procedure: LEFT HEART CATH AND CORONARY ANGIOGRAPHY;  Surgeon: Mady Bruckner, MD;  Location: [facility];  Service: Cardiovascular;  Laterality: N/A;   PACEMAKER LEADLESS INSERTION N/A 07/26/2022   Procedure: PACEMAKER LEADLESS INSERTION;  Surgeon: Ammon Blunt, MD;  Location: [facility];  Service: Cardiovascular;  Laterality: N/A;    Social History   Socioeconomic History   Marital status: Widowed    Spouse name: Not on file   Number of children: Not on file   Years of education: Not on file   Highest education level: Not on file  Occupational History   Not on file  Tobacco Use   Smoking status: Never   Smokeless tobacco: Never  Vaping Use  Vaping status: Never Used  Substance and Sexual Activity   Alcohol use: No   Drug use: No   Sexual activity: Not Currently  Other Topics Concern   Not on file  Social History Narrative   Lives with granddaughter, Harlene Snipes, and family.   Social Drivers of Corporate investment banker Strain: Not on file  Food Insecurity: No Food Insecurity (02/01/2023)   Hunger Vital Sign    Worried About Running Out of Food in the Last Year: Never true    Ran Out of Food in the Last Year: Never true  Transportation Needs: No Transportation  Needs (02/01/2023)   PRAPARE - Administrator, Civil Service (Medical): No    Lack of Transportation (Non-Medical): No  Physical Activity: Not on file  Stress: Not on file  Social Connections: Not on file  Intimate Partner Violence: Not At Risk (02/01/2023)   Humiliation, Afraid, Rape, and Kick questionnaire    Fear of Current or Ex-Partner: No    Emotionally Abused: No    Physically Abused: No    Sexually Abused: No    Family History  Problem Relation Age of Onset   Heart disease Mother    Heart disease Father    Breast cancer Cousin     Allergies  Allergen Reactions   Aspirin  Hives   Other Hives    Yellow dye 6 FOOD COLOR YELLOW POWDER    Atorvastatin Other (See Comments)    unknown   Codeine Other (See Comments)    Doesn't know   Conj Estrog-Medroxyprogest Ace Other (See Comments)    PREMPRO 0.3-1.5 MG ORAL TABLET unknown   Prednisone  Other (See Comments)    Chest pain   Raloxifene Other (See Comments)    EVISTA 60 MG ORAL TABLET unknown   Ace Inhibitors Cough   Amlodipine Itching   Valsartan Itching    Outpatient Medications Prior to Visit  Medication Sig   albuterol  (VENTOLIN  HFA) 108 (90 Base) MCG/ACT inhaler Inhale 2 puffs into the lungs every 6 (six) hours as needed for wheezing or shortness of breath.   amiodarone  (PACERONE ) 200 MG tablet Take 1 tablet (200 mg total) by mouth daily.   apixaban  (ELIQUIS ) 2.5 MG TABS tablet Take 2.5 mg by mouth 2 (two) times daily.   cetirizine (ZYRTEC) 10 MG tablet Take 10 mg by mouth daily.   donepezil  (ARICEPT ) 10 MG tablet Take 1 tablet (10 mg total) by mouth at bedtime.   fluticasone  (FLONASE ) 50 MCG/ACT nasal spray Place 1 spray into both nostrils 2 (two) times daily as needed for allergies or rhinitis.   furosemide  (LASIX ) 20 MG tablet Take 20 mg by mouth every other day.   levothyroxine  (LEVOXYL ) 137 MCG tablet Take 1 tablet (137 mcg total) by mouth daily before breakfast.   metoprolol  succinate  (TOPROL -XL) 50 MG 24 hr tablet Take 50 mg by mouth daily.   mometasone -formoterol  (DULERA ) 200-5 MCG/ACT AERO Inhale 2 puffs into the lungs 2 (two) times daily.   omeprazole  (PRILOSEC ) 40 MG capsule TAKE 1 CAPSULE BY MOUTH DAILY   potassium chloride  (KLOR-CON  M) 10 MEQ tablet Take 10 mEq by mouth every other day.   pravastatin  (PRAVACHOL ) 20 MG tablet Take 20 mg by mouth at bedtime.   telmisartan  (MICARDIS ) 80 MG tablet Take 1 tablet (80 mg total) by mouth daily.   [DISCONTINUED] potassium chloride  (KLOR-CON  M) 10 MEQ tablet Take 2 tablets (20 mEq total) by mouth 2 (two) times daily.   icosapent   Ethyl (VASCEPA ) 1 g capsule TAKE 2 CAPSULES BY MOUTH TWICE DAILY WITH MEALS (Patient not taking: Reported on 07/26/2024)   isosorbide  mononitrate (IMDUR ) 60 MG 24 hr tablet Take 1 tablet (60 mg total) by mouth daily. (Patient not taking: Reported on 07/26/2024)   [DISCONTINUED] apixaban  (ELIQUIS ) 5 MG TABS tablet Take 1 tablet (5 mg total) by mouth 2 (two) times daily. (Patient not taking: Reported on 07/26/2024)   [DISCONTINUED] furosemide  (LASIX ) 40 MG tablet Take 0.5 tablets (20 mg total) by mouth daily. (Patient not taking: Reported on 07/26/2024)   [DISCONTINUED] pravastatin  (PRAVACHOL ) 40 MG tablet TAKE 1 TABLET(40 MG) BY MOUTH DAILY AT 6 PM (Patient not taking: Reported on 07/26/2024)   No facility-administered medications prior to visit.    Review of Systems  Constitutional:  Positive for malaise/fatigue. Negative for chills and fever.  HENT: Negative.  Negative for congestion and sore throat.   Eyes: Negative.  Negative for blurred vision and pain.  Respiratory: Negative.  Negative for cough and shortness of breath.   Cardiovascular: Negative.  Negative for chest pain, palpitations and leg swelling.  Gastrointestinal: Negative.  Negative for abdominal pain, blood in stool, constipation, diarrhea, heartburn, melena, nausea and vomiting.  Genitourinary: Negative.  Negative for dysuria, flank pain,  frequency and urgency.  Musculoskeletal: Negative.  Negative for joint pain and myalgias.  Skin: Negative.   Neurological:  Positive for weakness. Negative for dizziness, tingling, sensory change and headaches.  Endo/Heme/Allergies: Negative.   Psychiatric/Behavioral: Negative.  Negative for depression and suicidal ideas. The patient is not nervous/anxious.        Objective:   BP 122/72   Pulse 60   Ht 5' (1.524 m)   Wt 136 lb (61.7 kg)   SpO2 96%   BMI 26.56 kg/m   Vitals:   07/26/24 1449  BP: 122/72  Pulse: 60  Height: 5' (1.524 m)  Weight: 136 lb (61.7 kg)  SpO2: 96%  BMI (Calculated): 26.56    Physical Exam Vitals and nursing note reviewed.  Constitutional:      Appearance: Normal appearance.  HENT:     Head: Normocephalic and atraumatic.     Nose: Nose normal.     Mouth/Throat:     Mouth: Mucous membranes are moist.     Pharynx: Oropharynx is clear.  Eyes:     Conjunctiva/sclera: Conjunctivae normal.     Pupils: Pupils are equal, round, and reactive to light.  Cardiovascular:     Rate and Rhythm: Normal rate and regular rhythm.     Pulses: Normal pulses.     Heart sounds: Normal heart sounds. No murmur heard. Pulmonary:     Effort: Pulmonary effort is normal.     Breath sounds: Normal breath sounds. No wheezing.  Abdominal:     General: Bowel sounds are normal.     Palpations: Abdomen is soft.     Tenderness: There is no abdominal tenderness. There is no right CVA tenderness or left CVA tenderness.  Musculoskeletal:        General: Normal range of motion.     Cervical back: Normal range of motion.     Right lower leg: No edema.     Left lower leg: No edema.  Skin:    General: Skin is warm and dry.  Neurological:     General: No focal deficit present.     Mental Status: She is alert and oriented to person, place, and time.  Psychiatric:        Mood  and Affect: Mood normal.        Behavior: Behavior normal.      No results found for any visits  on 07/26/24.  No results found for this or any previous visit (from the past 2160 hours).    Assessment & Plan:  Continue medications as prescribed. Routine blood work collected;FU with patient on results.Referral to dermatology. Flu shot given. Problem List Items Addressed This Visit     Essential hypertension   Relevant Medications   metoprolol  succinate (TOPROL -XL) 50 MG 24 hr tablet   apixaban  (ELIQUIS ) 2.5 MG TABS tablet   furosemide  (LASIX ) 20 MG tablet   pravastatin  (PRAVACHOL ) 20 MG tablet   Chronic HFrEF (heart failure with reduced ejection fraction)  with improved EF(HCC) - Primary   Relevant Medications   metoprolol  succinate (TOPROL -XL) 50 MG 24 hr tablet   apixaban  (ELIQUIS ) 2.5 MG TABS tablet   furosemide  (LASIX ) 20 MG tablet   pravastatin  (PRAVACHOL ) 20 MG tablet   Mixed hyperlipidemia   Relevant Medications   metoprolol  succinate (TOPROL -XL) 50 MG 24 hr tablet   apixaban  (ELIQUIS ) 2.5 MG TABS tablet   furosemide  (LASIX ) 20 MG tablet   pravastatin  (PRAVACHOL ) 20 MG tablet   Other Relevant Orders   Lipid Panel w/o Chol/HDL Ratio   Coronary artery disease involving native coronary artery of native heart without angina pectoris   Relevant Medications   metoprolol  succinate (TOPROL -XL) 50 MG 24 hr tablet   apixaban  (ELIQUIS ) 2.5 MG TABS tablet   furosemide  (LASIX ) 20 MG tablet   pravastatin  (PRAVACHOL ) 20 MG tablet   Other Relevant Orders   CMP14+EGFR   Chronic atrial fibrillation (HCC)   Relevant Medications   metoprolol  succinate (TOPROL -XL) 50 MG 24 hr tablet   apixaban  (ELIQUIS ) 2.5 MG TABS tablet   furosemide  (LASIX ) 20 MG tablet   pravastatin  (PRAVACHOL ) 20 MG tablet   Enlarged skin mole   Relevant Orders   Ambulatory referral to Dermatology   Other fatigue   Relevant Orders   TSH   CBC with Diff   Flu vaccine need   Relevant Orders   Flu Vaccine Trivalent High Dose (Fluad) (Completed)    Return in about 3 months (around 10/26/2024).   Total  time spent: 25 minutes  FERNAND FREDY RAMAN, MD  07/26/2024   This document may have been prepared by [facility] Voice Recognition software and as such may include unintentional dictation errors.

## 2024-07-27 LAB — CBC WITH DIFFERENTIAL/PLATELET
Basophils Absolute: 0.1 x10E3/uL (ref 0.0–0.2)
Basos: 1 %
EOS (ABSOLUTE): 0.2 x10E3/uL (ref 0.0–0.4)
Eos: 3 %
Hematocrit: 48.6 % — ABNORMAL HIGH (ref 34.0–46.6)
Hemoglobin: 16.2 g/dL — ABNORMAL HIGH (ref 11.1–15.9)
Immature Grans (Abs): 0 x10E3/uL (ref 0.0–0.1)
Immature Granulocytes: 0 %
Lymphocytes Absolute: 1.2 x10E3/uL (ref 0.7–3.1)
Lymphs: 19 %
MCH: 32 pg (ref 26.6–33.0)
MCHC: 33.3 g/dL (ref 31.5–35.7)
MCV: 96 fL (ref 79–97)
Monocytes Absolute: 0.5 x10E3/uL (ref 0.1–0.9)
Monocytes: 9 %
Neutrophils Absolute: 4.1 x10E3/uL (ref 1.4–7.0)
Neutrophils: 68 %
Platelets: 263 x10E3/uL (ref 150–450)
RBC: 5.06 x10E6/uL (ref 3.77–5.28)
RDW: 13.4 % (ref 11.7–15.4)
WBC: 6.1 x10E3/uL (ref 3.4–10.8)

## 2024-07-27 LAB — CMP14+EGFR
ALT: 8 IU/L (ref 0–32)
AST: 17 IU/L (ref 0–40)
Albumin: 3.9 g/dL (ref 3.7–4.7)
Alkaline Phosphatase: 60 IU/L (ref 48–129)
BUN/Creatinine Ratio: 12 (ref 12–28)
BUN: 17 mg/dL (ref 8–27)
Bilirubin Total: 1.1 mg/dL (ref 0.0–1.2)
CO2: 26 mmol/L (ref 20–29)
Calcium: 10.5 mg/dL — ABNORMAL HIGH (ref 8.7–10.3)
Chloride: 102 mmol/L (ref 96–106)
Creatinine, Ser: 1.42 mg/dL — ABNORMAL HIGH (ref 0.57–1.00)
Globulin, Total: 2.4 g/dL (ref 1.5–4.5)
Glucose: 86 mg/dL (ref 70–99)
Potassium: 4.7 mmol/L (ref 3.5–5.2)
Sodium: 141 mmol/L (ref 134–144)
Total Protein: 6.3 g/dL (ref 6.0–8.5)
eGFR: 36 mL/min/1.73 — ABNORMAL LOW (ref >59)

## 2024-07-27 LAB — LIPID PANEL W/O CHOL/HDL RATIO
Cholesterol, Total: 174 mg/dL (ref 100–199)
HDL: 59 mg/dL (ref >39)
LDL Chol Calc (NIH): 98 mg/dL (ref 0–99)
Triglycerides: 91 mg/dL (ref 0–149)
VLDL Cholesterol Cal: 17 mg/dL (ref 5–40)

## 2024-07-27 LAB — TSH: TSH: 32.3 u[IU]/mL — ABNORMAL HIGH (ref 0.450–4.500)

## 2024-07-29 ENCOUNTER — Ambulatory Visit: Payer: Self-pay | Admitting: Internal Medicine

## 2024-10-12 ENCOUNTER — Other Ambulatory Visit: Payer: Self-pay | Admitting: Internal Medicine

## 2024-10-12 DIAGNOSIS — E038 Other specified hypothyroidism: Secondary | ICD-10-CM

## 2024-11-01 ENCOUNTER — Ambulatory Visit: Admitting: Internal Medicine

## 2024-11-01 ENCOUNTER — Encounter: Payer: Self-pay | Admitting: Internal Medicine

## 2024-11-01 VITALS — BP 130/80 | HR 62 | Ht 60.0 in | Wt 139.8 lb

## 2024-11-01 DIAGNOSIS — E782 Mixed hyperlipidemia: Secondary | ICD-10-CM

## 2024-11-01 DIAGNOSIS — R413 Other amnesia: Secondary | ICD-10-CM | POA: Diagnosis not present

## 2024-11-01 DIAGNOSIS — I2510 Atherosclerotic heart disease of native coronary artery without angina pectoris: Secondary | ICD-10-CM | POA: Diagnosis not present

## 2024-11-01 DIAGNOSIS — I10 Essential (primary) hypertension: Secondary | ICD-10-CM

## 2024-11-01 DIAGNOSIS — E038 Other specified hypothyroidism: Secondary | ICD-10-CM | POA: Diagnosis not present

## 2024-11-01 DIAGNOSIS — R5383 Other fatigue: Secondary | ICD-10-CM

## 2024-11-01 MED ORDER — ICOSAPENT ETHYL 1 G PO CAPS: 2.0000 g | ORAL_CAPSULE | Freq: Two times a day (BID) | ORAL | 2 refills | Status: AC

## 2024-11-01 MED ORDER — ISOSORBIDE MONONITRATE ER 60 MG PO TB24: 60.0000 mg | ORAL_TABLET | Freq: Every day | ORAL | 3 refills | Status: AC

## 2024-11-01 MED ORDER — DONEPEZIL HCL 10 MG PO TABS: 10.0000 mg | ORAL_TABLET | Freq: Every day | ORAL | 2 refills | Status: AC

## 2024-11-01 MED ORDER — LEVOTHYROXINE SODIUM 137 MCG PO TABS: 137.0000 ug | ORAL_TABLET | Freq: Every day | ORAL | 3 refills | Status: AC

## 2024-11-01 NOTE — Progress Notes (Signed)
 "  Established Patient Office Visit  Subjective:  Patient ID: Andrea Phillips, female    DOB: Oct 25, 1934  Age: 89 y.o. MRN: [id number]  Chief Complaint  Patient presents with   Follow-up    3 month follow up    Patient comes in for follow-up today.  She looks good but complains of feeling fatigued and tired, no energy.  Denies chest pain, no shortness of breath and no palpitations.  However she admits that she that she does not take all her medications regularly.  Especially her Levoxyl .  Her last few TSH levels were very high.  Patient had been advised repeatedly to take her levothyroxine  empty stomach.  Will check her labs today.  Patient will come back in 3 weeks and advised to bring all her medication bottles with her.  Patient is accompanied by her grandson who has been advised again to make sure she is taking her medications as prescribed.    No other concerns at this time.   Past Medical History:  Diagnosis Date   Arthritis    ra   Asthma    CHF (congestive heart failure) (HCC)    Dysrhythmia    Edema extremities    GERD (gastroesophageal reflux disease)    Gout    Headache    History of hiatal hernia    Hypertension    Hypothyroidism    Shortness of breath dyspnea    Sleep apnea     Past Surgical History:  Procedure Laterality Date   APPENDECTOMY     BACK SURGERY     CARDIAC CATHETERIZATION     CATARACT EXTRACTION W/PHACO Left 03/09/2015   Procedure: CATARACT EXTRACTION PHACO AND INTRAOCULAR LENS PLACEMENT (IOC);  Surgeon: Steven Dingeldein, MD;  Location: [facility];  Service: Ophthalmology;  Laterality: Left;  US  01:31 AP% 26.1 CDE 43.72   CATARACT EXTRACTION W/PHACO Right 08/19/2021   Procedure: CATARACT EXTRACTION PHACO AND INTRAOCULAR LENS PLACEMENT (IOC) RIGHT;  Surgeon: Jaye Fallow, MD;  Location: [facility];  Service: Ophthalmology;  Laterality: Right;  12.31 1:07.0   CATARACT EXTRACTION W/PHACO Right 09/07/2021   Procedure: REMOVAL OF LENS  FRAGMENTS RIGHT;  Surgeon: Jaye Fallow, MD;  Location: [facility];  Service: Ophthalmology;  Laterality: Right;   CHOLECYSTECTOMY     CORONARY/GRAFT ACUTE MI REVASCULARIZATION N/A 10/24/2021   Procedure: Coronary/Graft Acute MI Revascularization;  Surgeon: Mady Bruckner, MD;  Location: [facility];  Service: Cardiovascular;  Laterality: N/A;   EYE SURGERY     retina   JOINT REPLACEMENT     left knee   LEFT HEART CATH AND CORONARY ANGIOGRAPHY N/A 04/02/2021   Procedure: LEFT HEART CATH AND CORONARY ANGIOGRAPHY;  Surgeon: Hester Wolm PARAS, MD;  Location: [facility];  Service: Cardiovascular;  Laterality: N/A;   LEFT HEART CATH AND CORONARY ANGIOGRAPHY N/A 10/24/2021   Procedure: LEFT HEART CATH AND CORONARY ANGIOGRAPHY;  Surgeon: Mady Bruckner, MD;  Location: [facility];  Service: Cardiovascular;  Laterality: N/A;   PACEMAKER LEADLESS INSERTION N/A 07/26/2022   Procedure: PACEMAKER LEADLESS INSERTION;  Surgeon: Ammon Blunt, MD;  Location: [facility];  Service: Cardiovascular;  Laterality: N/A;    Social History   Socioeconomic History   Marital status: Widowed    Spouse name: Not on file   Number of children: Not on file   Years of education: Not on file   Highest education level: Not on file  Occupational History   Not on file  Tobacco  Use   Smoking status: Never   Smokeless tobacco: Never  Vaping Use   Vaping status: Never Used  Substance and Sexual Activity   Alcohol use: No   Drug use: No   Sexual activity: Not Currently  Other Topics Concern   Not on file  Social History Narrative   Lives with granddaughter, Harlene Snipes, and family.   Social Drivers of Health   Tobacco Use: Low Risk (11/01/2024)   Patient History    Smoking Tobacco Use: Never    Smokeless Tobacco Use: Never    Passive Exposure: Not on file  Financial Resource Strain: Not on file  Food Insecurity: No Food Insecurity (02/01/2023)    Hunger Vital Sign    Worried About Running Out of Food in the Last Year: Never true    Ran Out of Food in the Last Year: Never true  Transportation Needs: No Transportation Needs (02/01/2023)   PRAPARE - Administrator, Civil Service (Medical): No    Lack of Transportation (Non-Medical): No  Physical Activity: Not on file  Stress: Not on file  Social Connections: Not on file  Intimate Partner Violence: Not At Risk (02/01/2023)   Humiliation, Afraid, Rape, and Kick questionnaire    Fear of Current or Ex-Partner: No    Emotionally Abused: No    Physically Abused: No    Sexually Abused: No  Depression (PHQ2-9): Medium Risk (12/29/2023)   Depression (PHQ2-9)    PHQ-2 Score: 6  Alcohol Screen: Not on file  Housing: Unknown (02/27/2024)   Received from [facility]   Epic    Unable to Pay for Housing in the Last Year: Not on file    Number of Times Moved in the Last Year: Not on file    At any time in the past 12 months, were you homeless or living in a shelter (including now)?: No  Utilities: Not At Risk (02/01/2023)   [facility] Utilities    Threatened with loss of utilities: No  Health Literacy: Not on file    Family History  Problem Relation Age of Onset   Heart disease Mother    Heart disease Father    Breast cancer Cousin     Allergies[1]  Show/hide medication list[2]  Review of Systems  Constitutional:  Positive for malaise/fatigue. Negative for chills and fever.  HENT: Negative.  Negative for congestion and sore throat.   Eyes: Negative.  Negative for blurred vision and pain.  Respiratory: Negative.  Negative for cough and shortness of breath.   Cardiovascular: Negative.  Negative for chest pain, palpitations and leg swelling.  Gastrointestinal: Negative.  Negative for abdominal pain, blood in stool, constipation, diarrhea, heartburn, melena, nausea and vomiting.  Genitourinary: Negative.  Negative for dysuria, flank pain, frequency and urgency.   Musculoskeletal: Negative.  Negative for joint pain and myalgias.  Skin: Negative.   Neurological: Negative.  Negative for dizziness, tingling, sensory change, weakness and headaches.  Endo/Heme/Allergies: Negative.   Psychiatric/Behavioral: Negative.  Negative for depression and suicidal ideas. The patient is not nervous/anxious.        Objective:   BP 130/80   Pulse 62   Ht 5' (1.524 m)   Wt 139 lb 12.8 oz (63.4 kg)   SpO2 95%   BMI 27.30 kg/m   Vitals:   11/01/24 1435  BP: 130/80  Pulse: 62  Height: 5' (1.524 m)  Weight: 139 lb 12.8 oz (63.4 kg)  SpO2: 95%  BMI (Calculated): 27.3  Physical Exam Vitals and nursing note reviewed.  Constitutional:      Appearance: Normal appearance.  HENT:     Head: Normocephalic and atraumatic.     Nose: Nose normal.     Mouth/Throat:     Mouth: Mucous membranes are moist.     Pharynx: Oropharynx is clear.  Eyes:     Conjunctiva/sclera: Conjunctivae normal.     Pupils: Pupils are equal, round, and reactive to light.  Cardiovascular:     Rate and Rhythm: Normal rate and regular rhythm.     Pulses: Normal pulses.     Heart sounds: Normal heart sounds. No murmur heard. Pulmonary:     Effort: Pulmonary effort is normal.     Breath sounds: Normal breath sounds. No wheezing.  Abdominal:     General: Bowel sounds are normal.     Palpations: Abdomen is soft.     Tenderness: There is no abdominal tenderness. There is no right CVA tenderness or left CVA tenderness.  Musculoskeletal:        General: Normal range of motion.     Cervical back: Normal range of motion.     Right lower leg: No edema.     Left lower leg: No edema.  Skin:    General: Skin is warm and dry.  Neurological:     General: No focal deficit present.     Mental Status: She is alert and oriented to person, place, and time.  Psychiatric:        Mood and Affect: Mood normal.        Behavior: Behavior normal.      No results found for any visits on  11/01/24.  No results found for this or any previous visit (from the past 2160 hours).    Assessment & Plan:  Continue current medications.  Check blood work today.  Advised to bring in all her medications at her follow-up in 3 weeks. Problem List Items Addressed This Visit       Cardiovascular and Mediastinum   Essential hypertension - Primary   Relevant Medications   icosapent  Ethyl (VASCEPA ) 1 g capsule   isosorbide  mononitrate (IMDUR ) 60 MG 24 hr tablet   Coronary artery disease involving native coronary artery of native heart without angina pectoris   Relevant Medications   icosapent  Ethyl (VASCEPA ) 1 g capsule   isosorbide  mononitrate (IMDUR ) 60 MG 24 hr tablet   Other Relevant Orders   CMP14+EGFR     Endocrine   Hypothyroidism   Relevant Medications   levothyroxine  (SYNTHROID ) 137 MCG tablet     Other   Mixed hyperlipidemia   Relevant Medications   icosapent  Ethyl (VASCEPA ) 1 g capsule   isosorbide  mononitrate (IMDUR ) 60 MG 24 hr tablet   Other Relevant Orders   Lipid Panel w/o Chol/HDL Ratio   Memory impairment   Relevant Medications   donepezil  (ARICEPT ) 10 MG tablet   Other fatigue   Relevant Orders   TSH   CBC with Diff    Return in about 2 weeks (around 11/15/2024).   Total time spent: 30 minutes. This time includes review of previous notes and results and patient face to face interaction during today's visit.    Andrea FREDY RAMAN, MD  11/01/2024   This document may have been prepared by [employer] Voice Recognition software and as such may include unintentional dictation errors.     [1]  Allergies Allergen Reactions   Aspirin  Hives   Other Hives    Yellow dye  6 FOOD COLOR YELLOW POWDER    Atorvastatin Other (See Comments)    unknown   Codeine Other (See Comments)    Doesn't know   Conj Estrog-Medroxyprogest Ace Other (See Comments)    PREMPRO 0.3-1.5 MG ORAL TABLET unknown   Prednisone  Other (See Comments)    Chest pain   Raloxifene Other  (See Comments)    EVISTA 60 MG ORAL TABLET unknown   Ace Inhibitors Cough   Amlodipine Itching   Valsartan Itching  [2]  Outpatient Medications Prior to Visit  Medication Sig   albuterol  (VENTOLIN  HFA) 108 (90 Base) MCG/ACT inhaler Inhale 2 puffs into the lungs every 6 (six) hours as needed for wheezing or shortness of breath.   amiodarone  (PACERONE ) 200 MG tablet Take 1 tablet (200 mg total) by mouth daily.   apixaban  (ELIQUIS ) 2.5 MG TABS tablet Take 2.5 mg by mouth 2 (two) times daily.   cetirizine (ZYRTEC) 10 MG tablet Take 10 mg by mouth daily.   fluticasone  (FLONASE ) 50 MCG/ACT nasal spray Place 1 spray into both nostrils 2 (two) times daily as needed for allergies or rhinitis.   furosemide  (LASIX ) 20 MG tablet Take 20 mg by mouth every other day.   metoprolol  succinate (TOPROL -XL) 50 MG 24 hr tablet Take 50 mg by mouth daily.   mometasone -formoterol  (DULERA ) 200-5 MCG/ACT AERO Inhale 2 puffs into the lungs 2 (two) times daily.   omeprazole  (PRILOSEC ) 40 MG capsule TAKE 1 CAPSULE BY MOUTH DAILY   potassium chloride  (KLOR-CON  M) 10 MEQ tablet Take 10 mEq by mouth every other day.   pravastatin  (PRAVACHOL ) 20 MG tablet Take 20 mg by mouth at bedtime.   telmisartan  (MICARDIS ) 80 MG tablet Take 1 tablet (80 mg total) by mouth daily.   [DISCONTINUED] donepezil  (ARICEPT ) 10 MG tablet Take 1 tablet (10 mg total) by mouth at bedtime.   [DISCONTINUED] levothyroxine  (SYNTHROID ) 137 MCG tablet Take 1 tablet (137 mcg total) by mouth daily before breakfast.   [DISCONTINUED] icosapent  Ethyl (VASCEPA ) 1 g capsule TAKE 2 CAPSULES BY MOUTH TWICE DAILY WITH MEALS (Patient not taking: Reported on 11/01/2024)   [DISCONTINUED] isosorbide  mononitrate (IMDUR ) 60 MG 24 hr tablet Take 1 tablet (60 mg total) by mouth daily. (Patient not taking: Reported on 11/01/2024)   No facility-administered medications prior to visit.   "

## 2024-11-02 LAB — CMP14+EGFR
ALT: 12 IU/L (ref 0–32)
AST: 19 IU/L (ref 0–40)
Albumin: 3.9 g/dL (ref 3.7–4.7)
Alkaline Phosphatase: 65 IU/L (ref 48–129)
BUN/Creatinine Ratio: 15 (ref 12–28)
BUN: 20 mg/dL (ref 8–27)
Bilirubin Total: 1 mg/dL (ref 0.0–1.2)
CO2: 27 mmol/L (ref 20–29)
Calcium: 10.6 mg/dL — ABNORMAL HIGH (ref 8.7–10.3)
Chloride: 103 mmol/L (ref 96–106)
Creatinine, Ser: 1.3 mg/dL — ABNORMAL HIGH (ref 0.57–1.00)
Globulin, Total: 2.4 g/dL (ref 1.5–4.5)
Glucose: 84 mg/dL (ref 70–99)
Potassium: 4.2 mmol/L (ref 3.5–5.2)
Sodium: 145 mmol/L — ABNORMAL HIGH (ref 134–144)
Total Protein: 6.3 g/dL (ref 6.0–8.5)
eGFR: 40 mL/min/1.73 — ABNORMAL LOW

## 2024-11-02 LAB — CBC WITH DIFFERENTIAL/PLATELET
Basophils Absolute: 0.1 x10E3/uL (ref 0.0–0.2)
Basos: 1 %
EOS (ABSOLUTE): 0.6 x10E3/uL — ABNORMAL HIGH (ref 0.0–0.4)
Eos: 9 %
Hematocrit: 46 % (ref 34.0–46.6)
Hemoglobin: 14.8 g/dL (ref 11.1–15.9)
Immature Grans (Abs): 0 x10E3/uL (ref 0.0–0.1)
Immature Granulocytes: 0 %
Lymphocytes Absolute: 2.4 x10E3/uL (ref 0.7–3.1)
Lymphs: 34 %
MCH: 31.1 pg (ref 26.6–33.0)
MCHC: 32.2 g/dL (ref 31.5–35.7)
MCV: 97 fL (ref 79–97)
Monocytes Absolute: 0.5 x10E3/uL (ref 0.1–0.9)
Monocytes: 7 %
Neutrophils Absolute: 3.5 x10E3/uL (ref 1.4–7.0)
Neutrophils: 49 %
Platelets: 221 x10E3/uL (ref 150–450)
RBC: 4.76 x10E6/uL (ref 3.77–5.28)
RDW: 14.6 % (ref 11.7–15.4)
WBC: 7.1 x10E3/uL (ref 3.4–10.8)

## 2024-11-02 LAB — LIPID PANEL W/O CHOL/HDL RATIO
Cholesterol, Total: 222 mg/dL — ABNORMAL HIGH (ref 100–199)
HDL: 70 mg/dL
LDL Chol Calc (NIH): 122 mg/dL — ABNORMAL HIGH (ref 0–99)
Triglycerides: 171 mg/dL — ABNORMAL HIGH (ref 0–149)
VLDL Cholesterol Cal: 30 mg/dL (ref 5–40)

## 2024-11-02 LAB — TSH: TSH: 86.9 u[IU]/mL — ABNORMAL HIGH (ref 0.450–4.500)

## 2024-11-04 ENCOUNTER — Ambulatory Visit: Payer: Self-pay | Admitting: Internal Medicine

## 2024-11-05 ENCOUNTER — Encounter: Payer: Self-pay | Admitting: Internal Medicine

## 2024-11-22 ENCOUNTER — Encounter: Payer: Self-pay | Admitting: Internal Medicine

## 2024-11-22 ENCOUNTER — Ambulatory Visit: Admitting: Internal Medicine

## 2024-11-22 ENCOUNTER — Ambulatory Visit: Payer: Self-pay | Admitting: Internal Medicine

## 2024-11-22 VITALS — BP 128/82 | HR 94 | Ht 60.0 in | Wt 139.0 lb

## 2024-11-22 DIAGNOSIS — Z1389 Encounter for screening for other disorder: Secondary | ICD-10-CM | POA: Diagnosis not present

## 2024-11-22 DIAGNOSIS — I482 Chronic atrial fibrillation, unspecified: Secondary | ICD-10-CM | POA: Diagnosis not present

## 2024-11-22 DIAGNOSIS — E038 Other specified hypothyroidism: Secondary | ICD-10-CM

## 2024-11-22 DIAGNOSIS — N3000 Acute cystitis without hematuria: Secondary | ICD-10-CM | POA: Diagnosis not present

## 2024-11-22 DIAGNOSIS — I10 Essential (primary) hypertension: Secondary | ICD-10-CM | POA: Diagnosis not present

## 2024-11-22 DIAGNOSIS — E782 Mixed hyperlipidemia: Secondary | ICD-10-CM

## 2024-11-22 DIAGNOSIS — I2510 Atherosclerotic heart disease of native coronary artery without angina pectoris: Secondary | ICD-10-CM | POA: Diagnosis not present

## 2024-11-22 LAB — POCT URINALYSIS DIPSTICK
Glucose, UA: NEGATIVE
Nitrite, UA: NEGATIVE
Protein, UA: POSITIVE — AB
Spec Grav, UA: 1.025
Urobilinogen, UA: 0.2 U/dL
pH, UA: 5.5

## 2024-11-22 MED ORDER — NITROFURANTOIN MONOHYD MACRO 100 MG PO CAPS: 100.0000 mg | ORAL_CAPSULE | Freq: Two times a day (BID) | ORAL | 0 refills | Status: DC

## 2024-11-22 NOTE — Progress Notes (Signed)
 "  Established Patient Office Visit  Subjective:  Patient ID: Andrea Phillips, female    DOB: 11-03-1934  Age: 89 y.o. MRN: [id number]  Chief Complaint  Patient presents with   Follow-up    2 week follow up    Patient comes in for her follow-up accompanied by a family member.  At her last visit her labs were drawn which showed a very high TSH.  Patient admitted that she has not been taking her Levoxyl  regularly.  Her family was called and advised how important it is to take it every day empty stomach.  Recently at her cardiologist office she had complained of urinary problems.  Her urine dipstick today shows pus cells, will send it for culture sensitivity and empirically start her on p.o. Macrobid .  Denies nausea or vomiting, no back pain and no pelvic pain.    No other concerns at this time.   Past Medical History:  Diagnosis Date   Arthritis    ra   Asthma    CHF (congestive heart failure) (HCC)    Dysrhythmia    Edema extremities    GERD (gastroesophageal reflux disease)    Gout    Headache    History of hiatal hernia    Hypertension    Hypothyroidism    Shortness of breath dyspnea    Sleep apnea     Past Surgical History:  Procedure Laterality Date   APPENDECTOMY     BACK SURGERY     CARDIAC CATHETERIZATION     CATARACT EXTRACTION W/PHACO Left 03/09/2015   Procedure: CATARACT EXTRACTION PHACO AND INTRAOCULAR LENS PLACEMENT (IOC);  Surgeon: Steven Dingeldein, MD;  Location: [facility];  Service: Ophthalmology;  Laterality: Left;  US  01:31 AP% 26.1 CDE 43.72   CATARACT EXTRACTION W/PHACO Right 08/19/2021   Procedure: CATARACT EXTRACTION PHACO AND INTRAOCULAR LENS PLACEMENT (IOC) RIGHT;  Surgeon: Jaye Fallow, MD;  Location: [facility];  Service: Ophthalmology;  Laterality: Right;  12.31 1:07.0   CATARACT EXTRACTION W/PHACO Right 09/07/2021   Procedure: REMOVAL OF LENS FRAGMENTS RIGHT;  Surgeon: Jaye Fallow, MD;  Location: [facility];  Service:  Ophthalmology;  Laterality: Right;   CHOLECYSTECTOMY     CORONARY/GRAFT ACUTE MI REVASCULARIZATION N/A 10/24/2021   Procedure: Coronary/Graft Acute MI Revascularization;  Surgeon: Mady Bruckner, MD;  Location: [facility];  Service: Cardiovascular;  Laterality: N/A;   EYE SURGERY     retina   JOINT REPLACEMENT     left knee   LEFT HEART CATH AND CORONARY ANGIOGRAPHY N/A 04/02/2021   Procedure: LEFT HEART CATH AND CORONARY ANGIOGRAPHY;  Surgeon: Hester Wolm PARAS, MD;  Location: [facility];  Service: Cardiovascular;  Laterality: N/A;   LEFT HEART CATH AND CORONARY ANGIOGRAPHY N/A 10/24/2021   Procedure: LEFT HEART CATH AND CORONARY ANGIOGRAPHY;  Surgeon: Mady Bruckner, MD;  Location: [facility];  Service: Cardiovascular;  Laterality: N/A;   PACEMAKER LEADLESS INSERTION N/A 07/26/2022   Procedure: PACEMAKER LEADLESS INSERTION;  Surgeon: Ammon Blunt, MD;  Location: [facility];  Service: Cardiovascular;  Laterality: N/A;    Social History   Socioeconomic History   Marital status: Widowed    Spouse name: Not on file   Number of children: Not on file   Years of education: Not on file   Highest education level: Not on file  Occupational History   Not on file  Tobacco Use   Smoking status: Never   Smokeless tobacco: Never  Vaping Use  Vaping status: Never Used  Substance and Sexual Activity   Alcohol use: No   Drug use: No   Sexual activity: Not Currently  Other Topics Concern   Not on file  Social History Narrative   Lives with granddaughter, Harlene Snipes, and family.   Social Drivers of Health   Tobacco Use: Low Risk (11/22/2024)   Patient History    Smoking Tobacco Use: Never    Smokeless Tobacco Use: Never    Passive Exposure: Not on file  Financial Resource Strain: Not on file  Food Insecurity: No Food Insecurity (02/01/2023)   Hunger Vital Sign    Worried About Running Out of Food in the Last Year: Never true    Ran Out  of Food in the Last Year: Never true  Transportation Needs: No Transportation Needs (02/01/2023)   PRAPARE - Administrator, Civil Service (Medical): No    Lack of Transportation (Non-Medical): No  Physical Activity: Not on file  Stress: Not on file  Social Connections: Not on file  Intimate Partner Violence: Not At Risk (02/01/2023)   Humiliation, Afraid, Rape, and Kick questionnaire    Fear of Current or Ex-Partner: No    Emotionally Abused: No    Physically Abused: No    Sexually Abused: No  Depression (PHQ2-9): Medium Risk (12/29/2023)   Depression (PHQ2-9)    PHQ-2 Score: 6  Alcohol Screen: Not on file  Housing: Unknown (02/27/2024)   Received from [facility]   Epic    Unable to Pay for Housing in the Last Year: Not on file    Number of Times Moved in the Last Year: Not on file    At any time in the past 12 months, were you homeless or living in a shelter (including now)?: No  Utilities: Not At Risk (02/01/2023)   [facility] Utilities    Threatened with loss of utilities: No  Health Literacy: Not on file    Family History  Problem Relation Age of Onset   Heart disease Mother    Heart disease Father    Breast cancer Cousin     Allergies[1]  Show/hide medication list[2]  Review of Systems  Constitutional: Negative.  Negative for chills, fever and malaise/fatigue.  HENT: Negative.  Negative for congestion and sore throat.   Eyes: Negative.  Negative for blurred vision and pain.  Respiratory: Negative.  Negative for cough and shortness of breath.   Cardiovascular: Negative.  Negative for chest pain, palpitations and leg swelling.  Gastrointestinal: Negative.  Negative for abdominal pain, blood in stool, constipation, diarrhea, heartburn, melena, nausea and vomiting.  Genitourinary:  Positive for dysuria. Negative for flank pain, frequency and urgency.  Musculoskeletal: Negative.  Negative for joint pain and myalgias.  Skin: Negative.   Neurological:  Negative.  Negative for dizziness, tingling, sensory change, weakness and headaches.  Endo/Heme/Allergies: Negative.   Psychiatric/Behavioral: Negative.  Negative for depression and suicidal ideas. The patient is not nervous/anxious.        Objective:   BP 128/82   Pulse 94   Ht 5' (1.524 m)   Wt 139 lb (63 kg)   SpO2 96%   BMI 27.15 kg/m   Vitals:   11/22/24 1450  BP: 128/82  Pulse: 94  Height: 5' (1.524 m)  Weight: 139 lb (63 kg)  SpO2: 96%  BMI (Calculated): 27.15    Physical Exam Vitals and nursing note reviewed.  Constitutional:      Appearance: Normal appearance.  HENT:  Head: Normocephalic and atraumatic.     Nose: Nose normal.     Mouth/Throat:     Mouth: Mucous membranes are moist.     Pharynx: Oropharynx is clear.  Eyes:     Conjunctiva/sclera: Conjunctivae normal.     Pupils: Pupils are equal, round, and reactive to light.  Cardiovascular:     Rate and Rhythm: Normal rate and regular rhythm.     Pulses: Normal pulses.     Heart sounds: Normal heart sounds. No murmur heard. Pulmonary:     Effort: Pulmonary effort is normal.     Breath sounds: Normal breath sounds. No wheezing.  Abdominal:     General: Bowel sounds are normal.     Palpations: Abdomen is soft.     Tenderness: There is no abdominal tenderness. There is no right CVA tenderness or left CVA tenderness.  Musculoskeletal:        General: Normal range of motion.     Cervical back: Normal range of motion.     Right lower leg: No edema.     Left lower leg: No edema.  Skin:    General: Skin is warm and dry.  Neurological:     General: No focal deficit present.     Mental Status: She is alert and oriented to person, place, and time.  Psychiatric:        Mood and Affect: Mood normal.        Behavior: Behavior normal.      Results for orders placed or performed in visit on 11/22/24  POCT Urinalysis Dipstick (81002)  Result Value Ref Range   Color, UA Orange    Clarity, UA Cloudy     Glucose, UA Negative Negative   Bilirubin, UA Small    Ketones, UA Trace    Spec Grav, UA 1.025 1.010 - 1.025   Blood, UA Trace    pH, UA 5.5 5.0 - 8.0   Protein, UA Positive (A) Negative   Urobilinogen, UA 0.2 0.2 or 1.0 E.U./dL   Nitrite, UA Negative    Leukocytes, UA Small (1+) (A) Negative   Appearance Cloudy    Odor Yes     Recent Results (from the past 2160 hours)  TSH     Status: Abnormal   Collection Time: 11/01/24  3:13 PM  Result Value Ref Range   TSH 86.900 (H) 0.450 - 4.500 uIU/mL  CBC with Diff     Status: Abnormal   Collection Time: 11/01/24  3:13 PM  Result Value Ref Range   WBC 7.1 3.4 - 10.8 x10E3/uL   RBC 4.76 3.77 - 5.28 x10E6/uL   Hemoglobin 14.8 11.1 - 15.9 g/dL   Hematocrit 53.9 65.9 - 46.6 %   MCV 97 79 - 97 fL   MCH 31.1 26.6 - 33.0 pg   MCHC 32.2 31.5 - 35.7 g/dL   RDW 85.3 88.2 - 84.5 %   Platelets 221 150 - 450 x10E3/uL   Neutrophils 49 Not Estab. %   Lymphs 34 Not Estab. %   Monocytes 7 Not Estab. %   Eos 9 Not Estab. %   Basos 1 Not Estab. %   Neutrophils Absolute 3.5 1.4 - 7.0 x10E3/uL   Lymphocytes Absolute 2.4 0.7 - 3.1 x10E3/uL   Monocytes Absolute 0.5 0.1 - 0.9 x10E3/uL   EOS (ABSOLUTE) 0.6 (H) 0.0 - 0.4 x10E3/uL   Basophils Absolute 0.1 0.0 - 0.2 x10E3/uL   Immature Granulocytes 0 Not Estab. %   Immature Grans (Abs)  0.0 0.0 - 0.1 x10E3/uL  Lipid Panel w/o Chol/HDL Ratio     Status: Abnormal   Collection Time: 11/01/24  3:13 PM  Result Value Ref Range   Cholesterol, Total 222 (H) 100 - 199 mg/dL   Triglycerides 828 (H) 0 - 149 mg/dL   HDL 70 >60 mg/dL   VLDL Cholesterol Cal 30 5 - 40 mg/dL   LDL Chol Calc (NIH) 877 (H) 0 - 99 mg/dL  [id number]     Status: Abnormal   Collection Time: 11/01/24  3:13 PM  Result Value Ref Range   Glucose 84 70 - 99 mg/dL   BUN 20 8 - 27 mg/dL   Creatinine, Ser 8.69 (H) 0.57 - 1.00 mg/dL   eGFR 40 (L) >40 fO/fpw/8.26   BUN/Creatinine Ratio 15 12 - 28   Sodium 145 (H) 134 - 144 mmol/L    Potassium 4.2 3.5 - 5.2 mmol/L   Chloride 103 96 - 106 mmol/L   CO2 27 20 - 29 mmol/L   Calcium 10.6 (H) 8.7 - 10.3 mg/dL   Total Protein 6.3 6.0 - 8.5 g/dL   Albumin 3.9 3.7 - 4.7 g/dL   Globulin, Total 2.4 1.5 - 4.5 g/dL   Bilirubin Total 1.0 0.0 - 1.2 mg/dL   Alkaline Phosphatase 65 48 - 129 IU/L   AST 19 0 - 40 IU/L   ALT 12 0 - 32 IU/L  POCT Urinalysis Dipstick (18997)     Status: Abnormal   Collection Time: 11/22/24  2:56 PM  Result Value Ref Range   Color, UA Orange    Clarity, UA Cloudy    Glucose, UA Negative Negative   Bilirubin, UA Small    Ketones, UA Trace    Spec Grav, UA 1.025 1.010 - 1.025   Blood, UA Trace    pH, UA 5.5 5.0 - 8.0   Protein, UA Positive (A) Negative   Urobilinogen, UA 0.2 0.2 or 1.0 E.U./dL   Nitrite, UA Negative    Leukocytes, UA Small (1+) (A) Negative   Appearance Cloudy    Odor Yes       Assessment & Plan:  Discussed with family how important it is to visually confirm the patient taking her medications.  Urine sent for culture sensitivity.  Started on p.o. Macrobid .  Encouraged to maintain adequate hydration. Problem List Items Addressed This Visit       Cardiovascular and Mediastinum   Essential hypertension - Primary   Coronary artery disease involving native coronary artery of native heart without angina pectoris   Chronic atrial fibrillation (HCC)     Endocrine   Hypothyroidism     Other   Mixed hyperlipidemia   Other Visit Diagnoses       Screening for blood or protein in urine       Relevant Orders   POCT Urinalysis Dipstick (18997) (Completed)     Acute cystitis without hematuria       Relevant Medications   nitrofurantoin , macrocrystal-monohydrate, (MACROBID ) 100 MG capsule   Other Relevant Orders   Urine Culture       Return in about 4 weeks (around 12/20/2024).   Total time spent: 30 minutes. This time includes review of previous notes and results and patient face to face interaction during today's visit.     FERNAND FREDY RAMAN, MD  11/22/2024   This document may have been prepared by [facility] Voice Recognition software and as such may include unintentional dictation errors.     [1]  Allergies  Allergen Reactions   Aspirin  Hives   Other Hives    Yellow dye 6 FOOD COLOR YELLOW POWDER    Atorvastatin Other (See Comments)    unknown   Codeine Other (See Comments)    Doesn't know   Conj Estrog-Medroxyprogest Ace Other (See Comments)    PREMPRO 0.3-1.5 MG ORAL TABLET unknown   Prednisone  Other (See Comments)    Chest pain   Raloxifene Other (See Comments)    EVISTA 60 MG ORAL TABLET unknown   Ace Inhibitors Cough   Amlodipine Itching   Valsartan Itching  [2]  Outpatient Medications Prior to Visit  Medication Sig   albuterol  (VENTOLIN  HFA) 108 (90 Base) MCG/ACT inhaler Inhale 2 puffs into the lungs every 6 (six) hours as needed for wheezing or shortness of breath.   amiodarone  (PACERONE ) 200 MG tablet Take 1 tablet (200 mg total) by mouth daily.   apixaban  (ELIQUIS ) 2.5 MG TABS tablet Take 2.5 mg by mouth 2 (two) times daily.   cetirizine (ZYRTEC) 10 MG tablet Take 10 mg by mouth daily.   donepezil  (ARICEPT ) 10 MG tablet Take 1 tablet (10 mg total) by mouth at bedtime.   fluticasone  (FLONASE ) 50 MCG/ACT nasal spray Place 1 spray into both nostrils 2 (two) times daily as needed for allergies or rhinitis.   furosemide  (LASIX ) 20 MG tablet Take 20 mg by mouth every other day.   icosapent  Ethyl (VASCEPA ) 1 g capsule Take 2 capsules (2 g total) by mouth 2 (two) times daily.   isosorbide  mononitrate (IMDUR ) 60 MG 24 hr tablet Take 1 tablet (60 mg total) by mouth daily.   levothyroxine  (SYNTHROID ) 137 MCG tablet Take 1 tablet (137 mcg total) by mouth daily before breakfast.   metoprolol  succinate (TOPROL -XL) 50 MG 24 hr tablet Take 50 mg by mouth daily.   mometasone -formoterol  (DULERA ) 200-5 MCG/ACT AERO Inhale 2 puffs into the lungs 2 (two) times daily.   omeprazole  (PRILOSEC ) 40 MG capsule  TAKE 1 CAPSULE BY MOUTH DAILY   potassium chloride  (KLOR-CON  M) 10 MEQ tablet Take 10 mEq by mouth every other day.   pravastatin  (PRAVACHOL ) 20 MG tablet Take 20 mg by mouth at bedtime.   telmisartan  (MICARDIS ) 80 MG tablet Take 1 tablet (80 mg total) by mouth daily.   No facility-administered medications prior to visit.   "

## 2024-11-25 ENCOUNTER — Other Ambulatory Visit: Payer: Self-pay

## 2024-11-25 MED ORDER — CIPROFLOXACIN HCL 250 MG PO TABS: 250.0000 mg | ORAL_TABLET | Freq: Two times a day (BID) | ORAL | 0 refills | Status: AC

## 2024-11-26 LAB — URINE CULTURE

## 2024-12-20 ENCOUNTER — Ambulatory Visit: Admitting: Internal Medicine
